# Patient Record
Sex: Female | Born: 1965 | Race: Black or African American | Hispanic: No | Marital: Single | Smoking: Former smoker
Health system: Southern US, Community
[De-identification: ages and names within clinical notes are randomized; demographics above are authoritative.]

## PROBLEM LIST (undated history)

## (undated) DIAGNOSIS — T783XXA Angioneurotic edema, initial encounter: Secondary | ICD-10-CM

## (undated) DIAGNOSIS — F32A Depression, unspecified: Secondary | ICD-10-CM

## (undated) DIAGNOSIS — I10 Essential (primary) hypertension: Secondary | ICD-10-CM

## (undated) DIAGNOSIS — E669 Obesity, unspecified: Secondary | ICD-10-CM

## (undated) DIAGNOSIS — N840 Polyp of corpus uteri: Principal | ICD-10-CM

## (undated) DIAGNOSIS — G4730 Sleep apnea, unspecified: Secondary | ICD-10-CM

## (undated) DIAGNOSIS — F419 Anxiety disorder, unspecified: Secondary | ICD-10-CM

## (undated) DIAGNOSIS — R0683 Snoring: Secondary | ICD-10-CM

## (undated) DIAGNOSIS — G8929 Other chronic pain: Secondary | ICD-10-CM

## (undated) DIAGNOSIS — K219 Gastro-esophageal reflux disease without esophagitis: Secondary | ICD-10-CM

## (undated) DIAGNOSIS — D219 Benign neoplasm of connective and other soft tissue, unspecified: Secondary | ICD-10-CM

## (undated) DIAGNOSIS — R7303 Prediabetes: Secondary | ICD-10-CM

## (undated) DIAGNOSIS — E119 Type 2 diabetes mellitus without complications: Secondary | ICD-10-CM

## (undated) DIAGNOSIS — M25569 Pain in unspecified knee: Secondary | ICD-10-CM

## (undated) DIAGNOSIS — G629 Polyneuropathy, unspecified: Secondary | ICD-10-CM

## (undated) DIAGNOSIS — F329 Major depressive disorder, single episode, unspecified: Secondary | ICD-10-CM

## (undated) DIAGNOSIS — R569 Unspecified convulsions: Secondary | ICD-10-CM

## (undated) DIAGNOSIS — G5601 Carpal tunnel syndrome, right upper limb: Secondary | ICD-10-CM

## (undated) DIAGNOSIS — M1990 Unspecified osteoarthritis, unspecified site: Secondary | ICD-10-CM

## (undated) HISTORY — DX: Depression, unspecified: F32.A

## (undated) HISTORY — DX: Anxiety disorder, unspecified: F41.9

## (undated) HISTORY — DX: Unspecified osteoarthritis, unspecified site: M19.90

## (undated) HISTORY — DX: Polyp of corpus uteri: N84.0

## (undated) HISTORY — DX: Obesity, unspecified: E66.9

## (undated) HISTORY — DX: Benign neoplasm of connective and other soft tissue, unspecified: D21.9

## (undated) HISTORY — DX: Essential (primary) hypertension: I10

## (undated) HISTORY — DX: Prediabetes: R73.03

## (undated) HISTORY — PX: GASTRIC BYPASS: SHX52

## (undated) HISTORY — PX: APPENDECTOMY: SHX54

## (undated) HISTORY — DX: Snoring: R06.83

## (undated) HISTORY — DX: Major depressive disorder, single episode, unspecified: F32.9

## (undated) HISTORY — DX: Angioneurotic edema, initial encounter: T78.3XXA

## (undated) HISTORY — PX: TUBAL LIGATION: SHX77

---

## 2005-08-31 ENCOUNTER — Emergency Department (HOSPITAL_COMMUNITY): Admission: EM | Admit: 2005-08-31 | Discharge: 2005-08-31 | Payer: Self-pay | Admitting: Emergency Medicine

## 2006-11-09 ENCOUNTER — Ambulatory Visit (HOSPITAL_COMMUNITY): Admission: RE | Admit: 2006-11-09 | Discharge: 2006-11-09 | Payer: Self-pay | Admitting: Obstetrics & Gynecology

## 2006-11-25 ENCOUNTER — Ambulatory Visit (HOSPITAL_COMMUNITY): Admission: RE | Admit: 2006-11-25 | Discharge: 2006-11-25 | Payer: Self-pay | Admitting: Family Medicine

## 2006-11-25 ENCOUNTER — Ambulatory Visit: Payer: Self-pay | Admitting: Internal Medicine

## 2006-11-25 ENCOUNTER — Encounter: Payer: Self-pay | Admitting: Family Medicine

## 2007-07-22 ENCOUNTER — Emergency Department (HOSPITAL_COMMUNITY): Admission: EM | Admit: 2007-07-22 | Discharge: 2007-07-22 | Payer: Self-pay | Admitting: Emergency Medicine

## 2008-10-02 ENCOUNTER — Encounter: Payer: Self-pay | Admitting: Physician Assistant

## 2009-09-23 ENCOUNTER — Ambulatory Visit (HOSPITAL_COMMUNITY): Admission: RE | Admit: 2009-09-23 | Discharge: 2009-09-23 | Payer: Self-pay | Admitting: Family Medicine

## 2010-01-09 ENCOUNTER — Encounter: Payer: Self-pay | Admitting: Physician Assistant

## 2010-02-11 ENCOUNTER — Encounter: Payer: Self-pay | Admitting: Physician Assistant

## 2010-02-11 ENCOUNTER — Ambulatory Visit: Payer: Self-pay | Admitting: Family Medicine

## 2010-02-11 DIAGNOSIS — F418 Other specified anxiety disorders: Secondary | ICD-10-CM | POA: Insufficient documentation

## 2010-02-11 DIAGNOSIS — R0989 Other specified symptoms and signs involving the circulatory and respiratory systems: Secondary | ICD-10-CM

## 2010-02-11 DIAGNOSIS — R609 Edema, unspecified: Secondary | ICD-10-CM

## 2010-02-11 DIAGNOSIS — I10 Essential (primary) hypertension: Secondary | ICD-10-CM | POA: Insufficient documentation

## 2010-02-11 DIAGNOSIS — R0609 Other forms of dyspnea: Secondary | ICD-10-CM

## 2010-02-11 DIAGNOSIS — F411 Generalized anxiety disorder: Secondary | ICD-10-CM | POA: Insufficient documentation

## 2010-02-12 ENCOUNTER — Encounter: Payer: Self-pay | Admitting: Physician Assistant

## 2010-03-13 ENCOUNTER — Ambulatory Visit: Payer: Self-pay | Admitting: Cardiology

## 2010-03-13 DIAGNOSIS — R079 Chest pain, unspecified: Secondary | ICD-10-CM

## 2010-03-17 ENCOUNTER — Ambulatory Visit: Payer: Self-pay | Admitting: Family Medicine

## 2010-03-17 DIAGNOSIS — M25569 Pain in unspecified knee: Secondary | ICD-10-CM

## 2010-03-17 DIAGNOSIS — M549 Dorsalgia, unspecified: Secondary | ICD-10-CM | POA: Insufficient documentation

## 2010-03-18 ENCOUNTER — Encounter: Payer: Self-pay | Admitting: Cardiology

## 2010-03-18 ENCOUNTER — Encounter (HOSPITAL_COMMUNITY): Admission: RE | Admit: 2010-03-18 | Discharge: 2010-04-17 | Payer: Self-pay | Admitting: Cardiology

## 2010-03-18 ENCOUNTER — Ambulatory Visit: Payer: Self-pay | Admitting: Cardiology

## 2010-03-19 ENCOUNTER — Encounter: Payer: Self-pay | Admitting: Cardiology

## 2010-03-23 ENCOUNTER — Telehealth: Payer: Self-pay | Admitting: Physician Assistant

## 2010-04-16 ENCOUNTER — Ambulatory Visit: Payer: Self-pay | Admitting: Family Medicine

## 2010-04-16 ENCOUNTER — Encounter: Payer: Self-pay | Admitting: Physician Assistant

## 2010-04-16 DIAGNOSIS — R7303 Prediabetes: Secondary | ICD-10-CM

## 2010-04-16 DIAGNOSIS — E1159 Type 2 diabetes mellitus with other circulatory complications: Secondary | ICD-10-CM | POA: Insufficient documentation

## 2010-04-17 ENCOUNTER — Encounter (INDEPENDENT_AMBULATORY_CARE_PROVIDER_SITE_OTHER): Payer: Self-pay | Admitting: *Deleted

## 2010-06-18 ENCOUNTER — Telehealth: Payer: Self-pay | Admitting: Family Medicine

## 2010-06-24 ENCOUNTER — Ambulatory Visit: Payer: Self-pay | Admitting: Family Medicine

## 2010-06-24 DIAGNOSIS — G4730 Sleep apnea, unspecified: Secondary | ICD-10-CM | POA: Insufficient documentation

## 2010-06-25 ENCOUNTER — Telehealth: Payer: Self-pay | Admitting: Family Medicine

## 2010-06-25 LAB — CONVERTED CEMR LAB
ALT: 11 units/L (ref 0–35)
AST: 16 units/L (ref 0–37)
Albumin: 4 g/dL (ref 3.5–5.2)
Alkaline Phosphatase: 93 units/L (ref 39–117)
Bilirubin, Direct: 0.1 mg/dL (ref 0.0–0.3)
Calcium: 9.3 mg/dL (ref 8.4–10.5)
Chloride: 105 meq/L (ref 96–112)
Cholesterol: 160 mg/dL (ref 0–200)
Creatinine, Ser: 0.81 mg/dL (ref 0.40–1.20)
Hgb A1c MFr Bld: 6.1 % — ABNORMAL HIGH (ref <5.7)
Indirect Bilirubin: 0.6 mg/dL (ref 0.0–0.9)
LDL Cholesterol: 97 mg/dL (ref 0–99)
Sodium: 140 meq/L (ref 135–145)
Total Bilirubin: 0.7 mg/dL (ref 0.3–1.2)
Triglycerides: 88 mg/dL (ref <150)

## 2010-07-07 ENCOUNTER — Telehealth (INDEPENDENT_AMBULATORY_CARE_PROVIDER_SITE_OTHER): Payer: Self-pay | Admitting: *Deleted

## 2010-07-07 ENCOUNTER — Telehealth: Payer: Self-pay | Admitting: Family Medicine

## 2010-07-29 ENCOUNTER — Ambulatory Visit: Admit: 2010-07-29 | Payer: Self-pay | Admitting: Family Medicine

## 2010-08-06 NOTE — Assessment & Plan Note (Signed)
Summary: office visit   Vital Signs:  Patient profile:   45 year old female Menstrual status:  irregular LMP:     06/21/2010 Height:      66.5 inches Weight:      270.50 pounds BMI:     43.16 O2 Sat:      97 % on Room air Pulse rate:   103 / minute Pulse rhythm:   regular Resp:     16 per minute BP sitting:   120 / 60  (left arm)  Vitals Entered By: Adella Hare LPN (June 24, 2010 11:17 AM)  Nutrition Counseling: Patient's BMI is greater than 25 and therefore counseled on weight management options.  O2 Flow:  Room air CC: follow-up visit Is Patient Diabetic? No Comments did not bring meds to ov LMP (date): 06/21/2010     Menstrual Status irregular Enter LMP: 06/21/2010   Primary Care Provider:  Syliva Overman MD  CC:  follow-up visit.  History of Present Illness: Reports  that she has been doing well.She is concerned about sleep apnea with chronic fatigue and excessive daytime sleepiness. Denies recent fever or chills. Denies sinus pressure, nasal congestion , ear pain or sore throat. Denies chest congestion, or cough productive of sputum. Denies chest pain, palpitations, PND, orthopnea or leg swelling. Denies abdominal pain, nausea, vomitting, diarrhea or constipation. Denies change in bowel movements or bloody stool. Denies dysuria , frequency, incontinence or hesitancy. Denies  joint pain, swelling, or reduced mobility. Denies headaches, vertigo, seizures. Denies depression, anxiety or insomnia. Denies  rash, lesions, or itch.     Allergies (verified): 1)  ! Codeine  Past History:  Past Medical History: Anxiety Insomnia Arthritis Depression Hyperlipidemia Hypertension excessive daytime sleepiness and snoring  worse in   Review of Systems      See HPI General:  Complains of fatigue and sleep disorder. Eyes:  Denies discharge and red eye. Endo:  Denies excessive thirst and excessive urination. Heme:  Denies abnormal bruising and  bleeding. Allergy:  Denies hives or rash and itching eyes.  Physical Exam  General:  Well-developed,obese,in no acute distress; alert,appropriate and cooperative throughout examination HEENT: No facial asymmetry,  EOMI, No sinus tenderness, TM's Clear, oropharynx  pink and moist.   Chest: Clear to auscultation bilaterally.  CVS: S1, S2, No murmurs, No S3.   Abd: Soft, Nontender.  MS: Adequate ROM spine, hips, shoulders and knees.  Ext: No edema.   CNS: CN 2-12 intact, power tone and sensation normal throughout.   Skin: Intact, no visible lesions or rashes.  Psych: Good eye contact, normal affect.  Memory intact, not anxious or depressed appearing.    Impression & Recommendations:  Problem # 1:  SLEEP APNEA (ICD-780.57) Assessment Comment Only  Orders: Sleep Disorder Referral (Sleep Disorder)  Problem # 2:  MORBID OBESITY (ICD-278.01) Assessment: Comment Only  Ht: 66.5 (06/24/2010)   Wt: 270.50 (06/24/2010)   BMI: 43.16 (06/24/2010) therapeutic lifestyle change discussed and encouraged  Problem # 3:  HYPERTENSION (ICD-401.9) Assessment: Unchanged  Her updated medication list for this problem includes:    Torsemide 100 Mg Tabs (Torsemide) ..... One tab by mouth once daily    Metoprolol Tartrate 25 Mg Tabs (Metoprolol tartrate) ..... One tab by mouth two times a day  Orders: T-Basic Metabolic Panel [id number])  BP today: 120/60 Prior BP: 110/80 (04/16/2010)  Complete Medication List: 1)  Torsemide 100 Mg Tabs (Torsemide) .... One tab by mouth once daily 2)  Doxepin Hcl 10 Mg Caps (Doxepin hcl) .Marland KitchenMarland KitchenMarland Kitchen  One to two caps by mouth at bedtime 3)  Hydrocodone-acetaminophen 7.5-650 Mg Tabs (Hydrocodone-acetaminophen) .... One half to one tab by mouth three times a day as needed 4)  Clonazepam 1 Mg Tabs (Clonazepam) .... One tab by mouth qid as needed 5)  Simvastatin 40 Mg Tabs (Simvastatin) .... One tab by mouth at bedtime 6)  Metoprolol Tartrate 25 Mg Tabs (Metoprolol  tartrate) .... One tab by mouth two times a day 7)  Naproxen 500 Mg Tabs (Naproxen) .... One tab by mouth two times a day 8)  Klor-con M20 20 Meq Cr-tabs (Potassium chloride crys cr) .... One tab by mouth once daily 9)  Nexium 40 Mg Cpdr (Esomeprazole magnesium) .... Take 1 daily 10)  Phentermine Hcl 37.5 Mg Tabs (Phentermine hcl) .... Take 1 daily for weight loss 11)  Venlafaxine Hcl 37.5 Mg Xr24h-cap (Venlafaxine hcl) .... Take 1 capsule by mouth once a day  Other Orders: T-Lipid Profile [id number]) T-Hepatic Function [id number]) T- Hemoglobin A1C ([id number]) T-Vitamin D (25-Hydroxy) ([id number]) Tdap => 39yrs IM (28413) Admin 1st Vaccine (24401)  Patient Instructions: 1)  Please schedule a follow-up appointment in 4 to 6 weeks. 2)  It is important that you exercise regularly at least 20 minutes 5 times a week. If you develop chest pain, have severe difficulty breathing, or feel very tired , stop exercising immediately and seek medical attention. 3)  You need to lose weight. Consider a lower calorie diet and regular exercise.  4)  Lipid Panel prior to visit, ICD-9: 5)  BMP prior to visit, ICD-9:  fasting asap 6)  Hepatic Panel prior to visit, ICD-9: 7)  HbgA1C prior to visit, ICD-9: 8)  Vit D. 9)  You will be referred for a sleep study  Prescriptions: VENLAFAXINE HCL 37.5 MG XR24H-CAP (VENLAFAXINE HCL) Take 1 capsule by mouth once a day  #30 x 2   Entered and Authorized by:   Syliva Overman MD   Signed by:   Syliva Overman MD on 06/24/2010   Method used:   Electronically to        [facility] (retail)       [address] [place], [place]  [address]       Ph: [phone]       Fax: [phone]   RxID:   [id number]    Orders Added: 1)  Est. Patient Level IV [01601] 2)  T-Basic Metabolic Panel [[id number]] 3)  T-Lipid Profile [80061-22930] 4)  T-Hepatic Function [80076-22960] 5)  T- Hemoglobin A1C [83036-23375] 6)   T-Vitamin D (25-Hydroxy) [09323-55732] 7)  Sleep Disorder Referral [Sleep Disorder] 8)  Tdap => 32yrs IM [90715] 9)  Admin 1st Vaccine [20254]   Immunizations Administered:  Tetanus Vaccine:    Vaccine Type: Tdap    Site: right deltoid    Mfr: GlaxoSmithKline    Dose: 0.5 ml    Route: IM    Given by: Adella Hare LPN    Exp. Date: 04/23/2012    Lot #: [id number]    VIS given: 05/22/08 version given June 24, 2010.   Immunizations Administered:  Tetanus Vaccine:    Vaccine Type: Tdap    Site: right deltoid    Mfr: GlaxoSmithKline    Dose: 0.5 ml    Route: IM    Given by: Adella Hare LPN    Exp. Date: 04/23/2012    Lot #: [id number]    VIS given: 05/22/08 version  given June 24, 2010.

## 2010-08-06 NOTE — Assessment & Plan Note (Signed)
Summary: NP6/GLC   Visit Type:  Initial Consult Primary Provider:  Esperanza Sheets, PA-C   History of Present Illness: 45 year old woman referred for cardiology consultation. She relates a fairly long-standing history of dyspnea on exertion as well as fatigue. She indicates an intermittent "sharp" chest discomfort that is somewhat sporadic as well. She also reports increasing weight and some lower extremity edema, now on torsemide. She denies any history of known cardiovascular disease or cardiomyopathy. She does have some family history as noted below.  She has not undergone any ischemic cardiovascular testing. Echocardiography from 2008 is reviewed below. She does not exercise regularly.  Current Medications (verified): 1)  Torsemide 100 Mg Tabs (Torsemide) .... One Tab By Mouth Once Daily 2)  Doxepin Hcl 10 Mg Caps (Doxepin Hcl) .... One To Two Caps By Mouth At Bedtime 3)  Hydrocodone-Acetaminophen 7.5-650 Mg Tabs (Hydrocodone-Acetaminophen) .... One Half To One Tab By Mouth Three Times A Day As Needed 4)  Clonazepam 1 Mg Tabs (Clonazepam) .... One Tab By Mouth Qid As Needed 5)  Simvastatin 40 Mg Tabs (Simvastatin) .... One Tab By Mouth At Bedtime 6)  Metoprolol Tartrate 25 Mg Tabs (Metoprolol Tartrate) .... One Tab By Mouth Two Times A Day 7)  Naproxen 500 Mg Tabs (Naproxen) .... One Tab By Mouth Two Times A Day 8)  Klor-Con M20 20 Meq Cr-Tabs (Potassium Chloride Crys Cr) .... One Tab By Mouth Once Daily 9)  Nexium 40 Mg Cpdr (Esomeprazole Magnesium) .... Take 1 Daily  Allergies (verified): 1)  ! 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 Sudie Bailey, M.D.      TEST INDICATIONS:  Patient has a history of cardiomegaly on chest x-ray,   test to evaluate.      A 2-D echo with echo Doppler:  Left ventricle is normal in size.  End-   diastolic dimension of 44-mm, interventricular septum and posterior wall   are normal at 10-mm each.  Left atrium is normal at 38-mm, right atrium,   right ventricle are normal.  Aortic root is normal at 25-mm.      The aortic valve is normal with no insufficiency.      Mitral valve is mildly thickened, trace insufficiency.      Pulmonic valve is normal with no insufficiency.      Tricuspid valve is normal with no insufficiency.      Overall, LV systolic function is normal with an LVEF of approximately   65%.  RV EF is normal.      No pericardial effusion is seen.               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  The patient denies anorexia, fever, weight loss, syncope, prolonged cough, headaches, hemoptysis, abdominal pain, melena, hematochezia, and severe indigestion/heartburn.         Otherwise reviewed and negative except as outlined.  Vital Signs:  Patient profile:   45 year old female Weight:      271 pounds BMI:     43.24 Pulse rate:   97 / minute BP sitting:    141 / 87  (right arm)  Vitals Entered By: Dreama Saa, CNA (March 13, 2010 9:29 AM)  Physical Exam  Additional Exam:  Morbidly obese woman in no acute distress. HEENT: Conjunctiva and lids normal, oropharynx with moist mucosa. Neck: Supple, no carotid bruits or elevated JVP, no thyromegaly. Lungs: Clear to auscultation, nonlabored, no wheezing. Cardiac: Regular rate and rhythm, no S3 is evident, indistinct PMI. Abdomen: Obese, unable to palpate liver edge, bowel sounds present. No tenderness. Skin: Warm and dry. Musculoskeletal: No kyphosis. Extremities: No frank pitting edema, increased adipose tissue. Distal pulses 1-2+. Neuropsychiatric: Alert and oriented x3, affect appropriate   EKG  Procedure date:  03/13/2010  Findings:      Normal sinus rhythm at 90 beats per minute with probable left atrial enlargement and minor criteria for left hypertrophy.  Impression & Recommendations:  Problem # 1:  DYSPNEA ON EXERTION (ICD-786.09)  Chronic by history, over the last several months. Could be related to obesity with no regular exercise regimen, and relative diastolic dysfunction in the setting of hypertension and probable left ventricular hypertrophy. She does have risk factors for ischemic heart disease which are outlined above including hypertension, hyperlipidemia, and family history. We discussed the matter, and plan to proceed with a followup 2-D echocardiogram to reassess cardiac structure and function, as well as a Lexiscan Myoview to exclude significant ischemic heart disease. Followup arranged for one month to review.  Her updated medication list for this problem includes:    Torsemide 100 Mg Tabs (Torsemide) ..... One tab by mouth once daily    Metoprolol Tartrate 25 Mg Tabs (Metoprolol tartrate) ..... One tab by mouth two times a day  Problem # 2:  CHEST PAIN (ICD-786.50)  Atypical in description. As noted above, further ischemic workup is planned.  Her updated  medication list for this problem includes:    Metoprolol Tartrate 25 Mg Tabs (Metoprolol tartrate) ..... One tab by mouth two times a day  Her updated medication list for this problem includes:    Metoprolol Tartrate 25 Mg Tabs (Metoprolol tartrate) ..... One tab by mouth two times a day  Problem # 3:  MORBID OBESITY (ICD-278.01)  We did discuss weight loss and exercise today.  Other Orders: 2-D Echocardiogram (2D Echo) Nuclear Stress Test (Nuc Stress Test)  Patient Instructions: 1)  Your physician recommends that you continue on your current medications as directed. Please refer to the Current Medication list given to you today. 2)  Your physician has requested that you have an echocardiogram.  Echocardiography is a painless test that uses sound waves to create images of your heart. It provides your doctor with information about the size and shape of your heart and how well your heart's chambers and  valves are working.  This procedure takes approximately one hour. There are no restrictions for this procedure. 3)  Your physician has requested that you have an [employer]  For further information please visit [email or web address]  Please follow instruction sheet, as given. 4)  Your physician recommends that you schedule a follow-up appointment in: after tests in 1 month

## 2010-08-06 NOTE — Letter (Signed)
Summary: [place]on Treadmill (Nuc Med Stress)  [facility]  [address] [place], [place] [address]   Phone: [phone]  Fax: [phone]    Nuclear Medicine 1-Day Stress Test Information Sheet  Re:     Julie Ryan   DOB:     1965-07-20 MRN:     [id number] Weight:  Appointment Date: Register at: Appointment Time: Referring MD:  ___Exercise Stress  __Adenosine   __Dobutamine  _X_Lexiscan  __Persantine   __Thallium  Urgency: ____1 (next day)   ____2 (one week)    ____3 (PRN)  Patient will receive Follow Up call with results: Patient needs follow-up appointment:  Instructions regarding medication:  How to prepare for your stress test: 1. DO NOT eat or drinK after midnight the night before test. This includes no caffeine (coffee, tea, sodas, chocolate) if you were instructed to take your medications, drink water with it. 2. DO NOT use any tobacco products for at least 8 hours prior to arrival. 3. DO NOT wear dresses or any clothing that may have metal clasps or buttons. 4. Wear short sleeve shirts, loose clothing, and comfortalbe walking shoes. 5. DO NOT use lotions, oils or powder on your chest before the test. 6. The test will take approximately 3-4 hours from the time you arrive until completion. 7. To register the day of the test, go to the Short Stay entrance at [facility]. 8. If you must cancel your test, call [phone] as soon as you are aware. 9. Do not take Torsemide the morning of test.  After you arrive for test:   When you arrive at [facility], you will go to Short Stay to be registered. They will then send you to Radiology to check in. The Nuclear Medicine Tech will get you and start an IV in your arm or hand. A small amount of a radioactive tracer will then be injected into your IV. This tracer will then have to circulate for 30-45 minutes. During this time you will wait in the waiting room and you will be able to drink something  without caffeine. A series of pictures will be taken of your heart follwoing this waiting period. After the 1st set of pictures you will go to the stress lab to get ready for your stress test. During the stress test, another small amount of a radioactive tracer will be injected through your IV. When the stress test is complete, there is a short rest period while your heart rate and blood pressure will be monitored. When this monitoring period is complete you will have another set of pictrues taken. (The same as the 1st set of pictures). These pictures are taken between 15 minutes and 1 hour after the stress test. The time depends on the type of stress test you had. Your doctor will inform you of your test results within 7 days after test.    The possibilities of certain changes are possible during the test. They include abnormal blood pressure and disorders of the heart. Side effects of persantine or adenosine can include flushing, chest pain, shortness of breath, stomach tightness, headache and light-headedness. These side effects usually do not last long and are self-resolving. Every effort will be made to keep you comfortable and to minimize complications by obtaining a medical history and by close observation during the test. Emergency equipment, medications, and trained personnel are available to deal with any unusual situation which may arise.  Please notify office at least 48 hours  in advance if you are unable to keep this appt.

## 2010-08-06 NOTE — Assessment & Plan Note (Signed)
Summary: office visit   Vital Signs:  Patient profile:   45 year old female Weight:      277.25 pounds O2 Sat:      97 % on Room air Pulse rate:   88 / minute BP sitting:   140 / 78  (left arm) CC: follow up, going for a stress test in the am, saw Dr. Diona Browner on 9.9.11-- fluid is still their but other then that no problems.  Room 2 Is Patient Diabetic? No   Primary Provider:  Esperanza Sheets, PA-C  CC:  follow up, going for a stress test in the am, and saw Dr. Diona Browner on 9.9.11-- fluid is still their but other then that no problems.  Room 2.  History of Present Illness: Pt presents today for f/u.  Continues to have problems with peripheral edema.  She has not taken diuretic in 1 week though.  States she cannot take it on days she has to work. She plans to take it tomorrow.  Has not tried support stockings either.  She has had cardiology consult & is scheduled for stress test tomorrow.  Pt states she has a hx of pain in bilat knees, Rt > Lt.  She has had a cortisone injectionin her Rt in the past by her previous PCP and did help.  Has also been having pain in her Rt hip area. Worse after change of position, specifically sitting to standing.  States she has to stand for a minute for the pain to improve before she can move. No trauma.  No parastesias. She had xrays of her Rt knee this yr.  Showed mild DJD.  Had xrays of her back in 2008 and were neg.   Allergies: 1)  ! Codeine  Past History:  Past medical history reviewed for relevance to current acute and chronic problems.  Past Medical History: Reviewed history from 02/11/2010 and no changes required. Anxiety Insomnia Arthritis Depression Hyperlipidemia Hypertension  Review of Systems General:  Denies chills and fever. MS:  Complains of joint pain and low back pain; denies joint redness and mid back pain. Neuro:  Denies numbness and tingling.  Physical Exam  General:  Well-developed,well-nourished,in no acute distress;  alert,appropriate and cooperative throughout examination Head:  Normocephalic and atraumatic without obvious abnormalities. No apparent alopecia or balding. Lungs:  Normal respiratory effort, chest expands symmetrically. Lungs are clear to auscultation, no crackles or wheezes. Heart:  Normal rate and regular rhythm. S1 and S2 normal without gallop, murmur, click, rub or other extra sounds. Msk:  LS spine:  TTP Rt SI.  Nontender spinous processes and sciatic notch.  FROM.  Bilat knees:  FROM.  Nontender to palp.  Crepitus bilat patella noted. Extremities:  No clubbing, cyanosis, edema, or deformity noted with normal full range of motion of all joints.   Neurologic:  alert & oriented X3, strength normal in all extremities, gait normal, and DTRs symmetrical and normal.   Skin:  Intact without suspicious lesions or rashes Psych:  Cognition and judgment appear intact. Alert and cooperative with normal attention span and concentration. No apparent delusions, illusions, hallucinations   Impression & Recommendations:  Problem # 1:  PERIPHERAL EDEMA (ICD-782.3) Assessment Unchanged Encouraged improved compliance with diuretic, low sodium diet, elevate legs, and support stockings at work.  Her updated medication list for this problem includes:    Torsemide 100 Mg Tabs (Torsemide) ..... One tab by mouth once daily  Problem # 2:  KNEE PAIN (ZOX-096.04) Assessment: Deteriorated  Her updated  medication list for this problem includes:    Hydrocodone-acetaminophen 7.5-650 Mg Tabs (Hydrocodone-acetaminophen) ..... One half to one tab by mouth three times a day as needed    Naproxen 500 Mg Tabs (Naproxen) ..... One tab by mouth two times a day  Orders: Orthopedic Referral (Ortho)  Problem # 3:  LUMBAGO (ICD-724.2) Assessment: Comment Only  Her updated medication list for this problem includes:    Hydrocodone-acetaminophen 7.5-650 Mg Tabs (Hydrocodone-acetaminophen) ..... One half to one tab by  mouth three times a day as needed    Naproxen 500 Mg Tabs (Naproxen) ..... One tab by mouth two times a day  Complete Medication List: 1)  Torsemide 100 Mg Tabs (Torsemide) .... One tab by mouth once daily 2)  Doxepin Hcl 10 Mg Caps (Doxepin hcl) .... One to two caps by mouth at bedtime 3)  Hydrocodone-acetaminophen 7.5-650 Mg Tabs (Hydrocodone-acetaminophen) .... One half to one tab by mouth three times a day as needed 4)  Clonazepam 1 Mg Tabs (Clonazepam) .... One tab by mouth qid as needed 5)  Simvastatin 40 Mg Tabs (Simvastatin) .... One tab by mouth at bedtime 6)  Metoprolol Tartrate 25 Mg Tabs (Metoprolol tartrate) .... One tab by mouth two times a day 7)  Naproxen 500 Mg Tabs (Naproxen) .... One tab by mouth two times a day 8)  Klor-con M20 20 Meq Cr-tabs (Potassium chloride crys cr) .... One tab by mouth once daily 9)  Nexium 40 Mg Cpdr (Esomeprazole magnesium) .... Take 1 daily  Patient Instructions: 1)  Please schedule a follow-up appointment in 1 month. 2)  I am referring you to an orthopedic dr for your knee and hip pain. 3)  Continue Naproxen as needed.  You may also try heat or ice to these areas for pain relief. 4)  Continue your fluid pills.   5)  Try support hose while at work, and elevate your legs when able. 6)  Limit your Sodium (Salt). 7)  It is important that you exercise regularly at least 20 minutes 5 times a week. If you develop chest pain, have severe difficulty breathing, or feel very tired , stop exercising immediately and seek medical attention. 8)  You need to lose weight. Consider a lower calorie diet and regular exercise.

## 2010-08-06 NOTE — Letter (Signed)
Summary: med review sheet  med review sheet   Imported By: Rudene Anda 04/20/2010 16:22:00  _____________________________________________________________________  External Attachment:    Type:   Image     Comment:   External Document

## 2010-08-06 NOTE — Progress Notes (Signed)
Summary: MEDICATION  Phone Note Call from Patient   Summary of Call: PATIENT STATES NEED REFILL ON HYDROCODONE-(ACETAMINOPHEN) AND NAPROXEN 500 MG PLEASE SEND TO [facility] Initial call taken by: Eugenio Hoes,  June 18, 2010 2:30 PM  Follow-up for Phone Call        has appt next week, may i refill these meds? Follow-up by: Adella Hare LPN,  June 19, 2010 8:34 AM  Additional Follow-up for Phone Call Additional follow up Details #1::        deny needs to comme to office she has cancelled appts Additional Follow-up by: Syliva Overman MD,  June 19, 2010 3:55 PM

## 2010-08-06 NOTE — Letter (Signed)
Summary: [place] [facility], agricultural at [facility]  [address] [place] [address]   Phone: [phone]  Fax: [phone]      March 19, 2010 MRN: [id number]   Julie Ryan [address], [address] [place], [place]  [address]   Dear Ms. Dimmer,  Your test ordered by Selena Batten has been reviewed by your physician (or physician assistant) and was found to be normal or stable. Your physician (or physician assistant) felt no changes were needed at this time.  ____ Echocardiogram  __X__ Cardiac Stress Test  ____ Lab Work  ____ Peripheral vascular study of arms, legs or neck  ____ CT scan or X-ray  ____ Lung or Breathing test  ____ Other:  Please continue on current medical treatment. Thank you.   Nona Dell, MD, F.A.C.C

## 2010-08-06 NOTE — Progress Notes (Signed)
Summary: hydrocodone  Phone Note Call from Patient   Summary of Call: patient needs her hydrocodone called into [facility], she states you sent in her other medications yesterday just not this one.  Thanks Initial call taken by: Curtis Sites,  June 25, 2010 11:07 AM    Prescriptions: HYDROCODONE-ACETAMINOPHEN 7.5-650 MG TABS (HYDROCODONE-ACETAMINOPHEN) one half to one tab by mouth three times a day as needed  #30 x 2   Entered by:   Adella Hare LPN   Authorized by:   Syliva Overman MD   Signed by:   Adella Hare LPN on 81/19/1478   Method used:   Printed then faxed to ...       [facility] (retail)       [address]       [place], [place]  [address]       Ph: [phone]       Fax: [phone]   RxID:   [id number]

## 2010-08-06 NOTE — Assessment & Plan Note (Signed)
Summary: new patient- room 1   Vital Signs:  Patient profile:   45 year old female Height:      66.5 inches Weight:      275.50 pounds BMI:     43.96 O2 Sat:      98 % on Room air Pulse rate:   87 / minute Resp:     16 per minute BP sitting:   112 / 70  (left arm)  Vitals Entered By: Adella Hare LPN (February 11, 2010 10:57 AM)  Nutrition Counseling: Patient's BMI is greater than 25 and therefore counseled on weight management options. CC: new patient Is Patient Diabetic? No Pain Assessment Patient in pain? no        CC:  new patient.  History of Present Illness: New pt here to establish care with new PCP.  Pt presents today concerned with her swelling in her lower legs and ankles since last May or June.  She was on Furosemide and had no improvement. Her previous physician changed her to Demedex last week and she states she has noticed some mild improvement.  She has tried restricting her sodium intake and no change. Swelling is constant, am and evenings the same.  2-3 pillow orthopnea x 1 mos.  No chest pain.  Admits to difficulty breathing and chest pressure though with exertion, including 1 flight of stairs.  Pt is also very frustrated with her weight.  States she doesnt eat very much and doesnt feel that she eats enough to acct for her being overweight.  No exercise. Pt also states she is tired all the time.  Previous records, including recent labs from Dr Sudie Bailey reviewed. Per pt she had a sleep study 3 yrs ago & was neg.  + snoring, and nocturnal arousals  Current Medications (verified): 1)  Torsemide 100 Mg Tabs (Torsemide) .... One Tab By Mouth Once Daily 2)  Doxepin Hcl 10 Mg Caps (Doxepin Hcl) .... One To Two Caps By Mouth At Bedtime 3)  Hydrocodone-Acetaminophen 7.5-650 Mg Tabs (Hydrocodone-Acetaminophen) .... One Half To One Tab By Mouth Three Times A Day As Needed 4)  Clonazepam 1 Mg Tabs (Clonazepam) .... One Tab By Mouth Qid As Needed 5)  Simvastatin 40 Mg  Tabs (Simvastatin) .... One Tab By Mouth At Bedtime 6)  Metoprolol Tartrate 25 Mg Tabs (Metoprolol Tartrate) .... One Tab By Mouth Two Times A Day 7)  Naproxen 500 Mg Tabs (Naproxen) .... One Tab By Mouth Two Times A Day 8)  Klor-Con M20 20 Meq Cr-Tabs (Potassium Chloride Crys Cr) .... One Tab By Mouth Once Daily  Allergies (verified): 1)  ! Codeine  Past History:  Past medical, surgical, family and social histories (including risk factors) reviewed, and no changes noted (except as noted below).  Past Medical History: Anxiety Insomnia Arthritis Depression Hyperlipidemia Hypertension  Past Surgical History: Appendectomy Tubal ligation  Family History: Reviewed history and no changes required. mother living- CHF,DM, HTN father deceased- natural causes 3 sisters- MS, DM, Hrt Dz- x 1 2 brothers- DM x1  Social History: Reviewed history and no changes required. 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 CV:  Complains of difficulty breathing at night, difficulty breathing while lying down, shortness of breath with exertion, swelling of feet, and swelling of hands; denies chest pain or discomfort and palpitations. Resp:  Complains of shortness of breath; denies cough and wheezing. GI:  Denies abdominal pain, bloody stools, change in bowel habits, dark tarry stools, indigestion, loss of appetite, nausea, and vomiting. GU:  Denies dysuria, incontinence, and urinary frequency. MS:  Complains of joint pain.  Physical Exam  General:  Well-developed,well-nourished,in no acute distress; alert,appropriate and cooperative throughout examination Head:  Normocephalic and atraumatic without obvious abnormalities. No apparent alopecia or balding. Ears:  External ear exam shows no significant lesions or  deformities.  Otoscopic examination reveals clear canals, tympanic membranes are intact bilaterally without bulging, retraction, inflammation or discharge. Hearing is grossly normal bilaterally. Nose:  External nasal examination shows no deformity or inflammation. Nasal mucosa are pink and moist without lesions or exudates. Mouth:  Oral mucosa and oropharynx without lesions or exudates.  Teeth in good repair. Neck:  No deformities, masses, or tenderness noted.no thyromegaly and no thyroid nodules or tenderness.   Lungs:  Normal respiratory effort, chest expands symmetrically. Lungs are clear to auscultation, no crackles or wheezes. Heart:  Normal rate and regular rhythm. S1 and S2 normal without gallop, murmur, click, rub or other extra sounds. Pulses:  R posterior tibial normal, R dorsalis pedis normal, L posterior tibial normal, and L dorsalis pedis normal.   Extremities:  No PTE but edema noted dorsum of feet and ankles. Neurologic:  alert & oriented X3, strength normal in all extremities, sensation intact to light touch, and gait normal.   Skin:  Intact without suspicious lesions or rashes Cervical Nodes:  No lymphadenopathy noted Psych:  Cognition and judgment appear intact. Alert and cooperative with normal attention span and concentration. No apparent delusions, illusions, hallucinations   Impression & Recommendations:  Problem # 1:  PERIPHERAL EDEMA (ICD-782.3) Assessment Unchanged Per pt some slight improvement in the last week with change from Furosemide to Torsemide.  Her updated medication list for this problem includes:    Torsemide 100 Mg Tabs (Torsemide) ..... One tab by mouth once daily  Orders: Cardiology Referral (Cardiology)  Problem # 2:  DYSPNEA ON EXERTION (ICD-786.09) Assessment: New Also orthopnea.  May be due to morbid obesity, but referral to r/o cardiac.  Orders: Cardiology Referral (Cardiology)  Problem # 3:  MORBID OBESITY (ICD-278.01) Assessment:  Comment Only Discussed at length pts typical diet, and healthy diet.  Encouraged fruits, veggies, lean meats, and limit carbs.  Discussed whole grains and reading food labels. Discussed and enouraged [employer], or referral to dietician.  Pt states she wants to check into [employer] first.  Orders: T- Hemoglobin A1C ([address]) Cardiology Referral (Cardiology)  Problem # 4:  HYPERTENSION (ICD-401.9) Assessment: Comment Only BP currently well controlled.  Her updated medication list for this problem includes:    Torsemide 100 Mg Tabs (Torsemide) ..... One tab by mouth once daily    Metoprolol Tartrate 25 Mg Tabs (Metoprolol tartrate) ..... One tab by mouth two times a day  BP today: 112/70  Problem # 5:  HYPERLIPIDEMIA (ICD-272.4) Assessment: Comment Only  Her updated medication list for this problem includes:    Simvastatin 40 Mg Tabs (Simvastatin) ..... One tab by mouth at bedtime  Complete Medication List: 1)  Torsemide 100 Mg Tabs (Torsemide) .... One tab by mouth once daily 2)  Doxepin Hcl 10 Mg Caps (Doxepin hcl) .... One to two caps by mouth at bedtime 3)  Hydrocodone-acetaminophen 7.5-650 Mg Tabs (Hydrocodone-acetaminophen) .... One half to one tab by mouth three times a day as needed 4)  Clonazepam 1 Mg Tabs (Clonazepam) .Marland KitchenMarland KitchenMarland Kitchen  One tab by mouth qid as needed 5)  Simvastatin 40 Mg Tabs (Simvastatin) .... One tab by mouth at bedtime 6)  Metoprolol Tartrate 25 Mg Tabs (Metoprolol tartrate) .... One tab by mouth two times a day 7)  Naproxen 500 Mg Tabs (Naproxen) .... One tab by mouth two times a day 8)  Klor-con M20 20 Meq Cr-tabs (Potassium chloride crys cr) .... One tab by mouth once daily 9)  Nexium 40 Mg Cpdr (Esomeprazole magnesium) .... Take 1 daily  Patient Instructions: 1)  Please schedule a follow-up appointment in 1 month. 2)  Continue your current medications. 3)  We have discussed dietary changes.  Consider [employer] or a referral to a dietician to help  you. 4)  It is important that you exercise regularly at least 20 minutes 5 times a week. If you develop chest pain, have severe difficulty breathing, or feel very tired , stop exercising immediately and seek medical attention. 5)  You need to lose weight. Consider a lower calorie diet and regular exercise.  6)  I have referred you to a Cardiologist for consultation. Prescriptions: TORSEMIDE 100 MG TABS (TORSEMIDE) one tab by mouth once daily  #30 x 1   Entered and Authorized by:   Esperanza Sheets PA   Signed by:   Esperanza Sheets PA on 02/11/2010   Method used:   Electronically to        [facility] (retail)       [address]       [place], [place]  [address]       Ph: [phone]       Fax: [phone]   RxID:   [id number]

## 2010-08-06 NOTE — Letter (Signed)
Summary: Appointment - Missed  [facility] [place]  [address], [place] [address]   Phone: [phone]  Fax: [phone]     April 17, 2010 MRN: [id number]   Leonia Seider [address], [address] [place], [place]  [address]   Dear Ms. Saez,  Our records indicate you missed your appointment on        04/17/10                with Dr.       .[email or web address]                                    It is very important that we reach you to reschedule this appointment. We look forward to participating in your health care needs. Please contact us at the number listed above at your earliest convenience to reschedule this appointment.     Sincerely,    Glass blower/designer

## 2010-08-06 NOTE — Progress Notes (Signed)
Summary: RESULTS  Phone Note Call from Patient   Summary of Call: WANTS THE RESULTS OF HER STRESS TEST  CALL BACK AT [phone] Initial call taken by: Lind Guest,  March 23, 2010 1:02 PM  Follow-up for Phone Call        advised she will recieve a letter with normal results  patient wants to know since stress test was normal can you prescribe diet pill Follow-up by: Adella Hare LPN,  March 23, 2010 1:58 PM  Additional Follow-up for Phone Call Additional follow up Details #1::        I have prescribed one mos of Phentermine.  Advise her to keep her appt in Oct. Additional Follow-up by: Esperanza Sheets PA,  March 23, 2010 2:23 PM    Additional Follow-up for Phone Call Additional follow up Details #2::    returned call, left message Follow-up by: Adella Hare LPN,  March 23, 2010 3:12 PM  Additional Follow-up for Phone Call Additional follow up Details #3:: Details for Additional Follow-up Action Taken: patient aware Additional Follow-up by: Adella Hare LPN,  March 23, 2010 3:20 PM  New/Updated Medications: PHENTERMINE HCL 37.5 MG TABS (PHENTERMINE HCL) take 1 daily for weight loss Prescriptions: PHENTERMINE HCL 37.5 MG TABS (PHENTERMINE HCL) take 1 daily for weight loss  #30 x 0   Entered and Authorized by:   Esperanza Sheets PA   Signed by:   Esperanza Sheets PA on 03/23/2010   Method used:   Printed then faxed to ...       [facility] (retail)       [address]       [place], [place]  [address]       Ph: [phone]       Fax: [phone]   RxID:   [id number] PHENTERMINE HCL 37.5 MG TABS (PHENTERMINE HCL) take 1 daily for weight loss  #30 x 0   Entered and Authorized by:   Esperanza Sheets PA   Signed by:   Esperanza Sheets PA on 03/23/2010   Method used:   Printed then faxed to ...       [facility] (retail)       [address]       [place], [place]  [address]       Ph:  [phone]       Fax: [phone]   RxID:   [id number]

## 2010-08-06 NOTE — Progress Notes (Signed)
Summary: medicine and cold  Phone Note Call from Patient   Summary of Call: Julie Ryan apot did not receive her rx for hydrcodone wants you to call her at [phone] to see what to take for a cold Initial call taken by: Lind Guest,  July 07, 2010 10:16 AM  Follow-up for Phone Call        med called in and advised mucinex Follow-up by: Adella Hare LPN,  July 07, 2010 3:08 PM

## 2010-08-06 NOTE — Assessment & Plan Note (Signed)
Summary: office visit   Vital Signs:  Patient profile:   45 year old female Height:      66.5 inches Weight:      264.25 pounds BMI:     42.16 O2 Sat:      99 % on Room air Pulse rate:   97 / minute Resp:     16 per minute BP sitting:   110 / 80  (left arm)  Vitals Entered By: Mauricia Area CMA (April 16, 2010 11:21 AM) CC: Follow up Comments Did not bring meds   Primary Naithen [place]:  Esperanza Sheets, PA-C  CC:  Follow up.  History of Present Illness: Pt presents today for check up.  She started Phentermine 1 mos ago to help her with her wt loss.  SHe has lost 13 lbs.  She feels that the Phentermine is helping with her appetite.  Is also trying to eat healthier.  Hx of htn. Taking medications as prescribed and denies side effects.  No headache, chest pain or palpitations.  Stil having problems with peripheral edema. Uses Torsemide as needed. Hx of Hyperlipidemia.  Taking medications as prescribed and no side effects.  Following a low fat diet. Not exercising regularly. Pt requests refill Hydrocodone. Takes 1 tab three times a day as needed.  On avg uses 5 pills per week.  Takes this for knee pain, and menstrual cramps.    Allergies (verified): 1)  ! Codeine  Past History:  Past medical history reviewed for relevance to current acute and chronic problems.  Past Medical History: Reviewed history from 02/11/2010 and no changes required. Anxiety Insomnia Arthritis Depression Hyperlipidemia Hypertension  Review of Systems General:  Denies chills and fever. ENT:  Denies earache, nasal congestion, and sore throat. CV:  Complains of palpitations; denies chest pain or discomfort. Resp:  Denies cough and shortness of breath. GI:  Denies abdominal pain, change in bowel habits, indigestion, nausea, and vomiting.  Physical Exam  General:  Well-developed,well-nourished,in no acute distress; alert,appropriate and cooperative throughout examination Head:  Normocephalic and  atraumatic without obvious abnormalities. No apparent alopecia or balding. Ears:  External ear exam shows no significant lesions or deformities.  Otoscopic examination reveals clear canals, tympanic membranes are intact bilaterally without bulging, retraction, inflammation or discharge. Hearing is grossly normal bilaterally. Nose:  External nasal examination shows no deformity or inflammation. Nasal mucosa are pink and moist without lesions or exudates. Mouth:  Oral mucosa and oropharynx without lesions or exudates.  Teeth in good repair. Neck:  No deformities, masses, or tenderness noted. Lungs:  Normal respiratory effort, chest expands symmetrically. Lungs are clear to auscultation, no crackles or wheezes. Heart:  Normal rate and regular rhythm. S1 and S2 normal without gallop, murmur, click, rub or other extra sounds. Pulses:  R posterior tibial normal, R dorsalis pedis normal, L posterior tibial normal, and L dorsalis pedis normal.   Extremities:  trace left pedal edema and trace right pedal edema.   Neurologic:  alert & oriented X3, sensation intact to light touch, and gait normal.   Cervical Nodes:  No lymphadenopathy noted Psych:  Cognition and judgment appear intact. Alert and cooperative with normal attention span and concentration. No apparent delusions, illusions, hallucinations   Impression & Recommendations:  Problem # 1:  HYPERTENSION (ICD-401.9) Assessment Improved  Her updated medication list for this problem includes:    Torsemide 100 Mg Tabs (Torsemide) ..... One tab by mouth once daily    Metoprolol Tartrate 25 Mg Tabs (Metoprolol tartrate) ..... One  tab by mouth two times a day  BP today: 110/80 Prior BP: 140/78 (03/17/2010)  Orders: T-Comprehensive Metabolic Panel ([address])  Problem # 2:  PERIPHERAL EDEMA (ICD-782.3) Assessment: Comment Only  Her updated medication list for this problem includes:    Torsemide 100 Mg Tabs (Torsemide) ..... One tab by mouth  once daily  Problem # 3:  PRE-DIABETES (ICD-790.29) Assessment: Comment Only  Orders: T- Hemoglobin A1C ([address])  Problem # 4:  HYPERLIPIDEMIA (ICD-272.4) Assessment: Comment Only  Her updated medication list for this problem includes:    Simvastatin 40 Mg Tabs (Simvastatin) ..... One tab by mouth at bedtime  Orders: T-Comprehensive Metabolic Panel [id number]) T-Lipid Profile ([address])  Problem # 5:  MORBID OBESITY (ICD-278.01) Assessment: Comment Only  Complete Medication List: 1)  Torsemide 100 Mg Tabs (Torsemide) .... One tab by mouth once daily 2)  Doxepin Hcl 10 Mg Caps (Doxepin hcl) .... One to two caps by mouth at bedtime 3)  Hydrocodone-acetaminophen 7.5-650 Mg Tabs (Hydrocodone-acetaminophen) .... One half to one tab by mouth three times a day as needed 4)  Clonazepam 1 Mg Tabs (Clonazepam) .... One tab by mouth qid as needed 5)  Simvastatin 40 Mg Tabs (Simvastatin) .... One tab by mouth at bedtime 6)  Metoprolol Tartrate 25 Mg Tabs (Metoprolol tartrate) .... One tab by mouth two times a day 7)  Naproxen 500 Mg Tabs (Naproxen) .... One tab by mouth two times a day 8)  Klor-con M20 20 Meq Cr-tabs (Potassium chloride crys cr) .... One tab by mouth once daily 9)  Nexium 40 Mg Cpdr (Esomeprazole magnesium) .... Take 1 daily 10)  Phentermine Hcl 37.5 Mg Tabs (Phentermine hcl) .... Take 1 daily for weight loss  Patient Instructions: 1)  Please schedule a follow-up appointment in 2 months. 2)  Have blood work drawn in November fasting. 3)  I have refilled your medications as discussed. 4)  Congratulations on your weight loss!  Keep up the good work! 5)  It is important that you exercise regularly at least 20 minutes 5 times a week. If you develop chest pain, have severe difficulty breathing, or feel very tired , stop exercising immediately and seek medical attention. 6)  You need to lose weight. Consider a lower calorie diet and regular exercise.   Prescriptions: PHENTERMINE HCL 37.5 MG TABS (PHENTERMINE HCL) take 1 daily for weight loss  #30 x 1   Entered and Authorized by:   Esperanza Sheets PA   Signed by:   Esperanza Sheets PA on 04/16/2010   Method used:   Printed then faxed to ...       [facility] (retail)       [address]       [place], [place]  [address]       Ph: [phone]       Fax: [phone]   RxID:   [id number] DOXEPIN HCL 10 MG CAPS (DOXEPIN HCL) one to two caps by mouth at bedtime  #60 x 2   Entered and Authorized by:   Esperanza Sheets PA   Signed by:   Esperanza Sheets PA on 04/16/2010   Method used:   Electronically to        [facility] (retail)       [address] [place], [place]  [address]       Ph: [phone]  Fax: [phone]   RxID:   [id number] TORSEMIDE 100 MG TABS (TORSEMIDE) one tab by mouth once daily  #30 x 1   Entered and Authorized by:   Esperanza Sheets PA   Signed by:   Esperanza Sheets PA on 04/16/2010   Method used:   Electronically to        [facility] (retail)       [address]       [place], [place]  [address]       Ph: [phone]       Fax: [phone]   RxID:   [id number] HYDROCODONE-ACETAMINOPHEN 7.5-650 MG TABS (HYDROCODONE-ACETAMINOPHEN) one half to one tab by mouth three times a day as needed  #30 x 0   Entered and Authorized by:   Esperanza Sheets PA   Signed by:   Esperanza Sheets PA on 04/16/2010   Method used:   Printed then faxed to ...       [facility] (retail)       [address]       [place], [place]  [address]       Ph: [phone]       Fax: [phone]   RxID:   [id number]

## 2010-08-12 NOTE — Progress Notes (Signed)
Summary: sleep study appt  Phone Note Call from Patient   Summary of Call: does not want to go to dr Harriett Sine wants to go to the hospital Initial call taken by: Lind Guest,  July 07, 2010 10:17 AM  Follow-up for Phone Call        spoke to patient and advised her that Dr. Gerilyn Pilgrim would read the sleep study but she would go to the hospital to have the study done.  But she doesn't want to have to see him, so I will fax over paperwork to [facility]. Follow-up by: Curtis Sites,  July 09, 2010 9:14 AM

## 2010-08-31 ENCOUNTER — Telehealth: Payer: Self-pay | Admitting: Family Medicine

## 2010-09-01 ENCOUNTER — Telehealth: Payer: Self-pay | Admitting: Family Medicine

## 2010-09-10 NOTE — Progress Notes (Signed)
Summary: refills  Phone Note Call from Patient   Summary of Call: needs refills on phentermine, hydrocodine, depression med. [facility] [phone] Initial call taken by: Rudene Anda,  September 01, 2010 11:47 AM    Prescriptions: HYDROCODONE-ACETAMINOPHEN 7.5-650 MG TABS (HYDROCODONE-ACETAMINOPHEN) one half to one tab by mouth three times a day as needed  #30 x 0   Entered by:   Adella Hare LPN   Authorized by:   Syliva Overman MD   Signed by:   Adella Hare LPN on 45/40/9811   Method used:   Printed then faxed to ...       [facility] (retail)       [address]       [place], [place]  [address]       Ph: [phone]       Fax: [phone]   RxID:   [id number] PHENTERMINE HCL 37.5 MG TABS (PHENTERMINE HCL) take 1 daily for weight loss  #30 x 0   Entered by:   Adella Hare LPN   Authorized by:   Syliva Overman MD   Signed by:   Adella Hare LPN on 66/44/0347   Method used:   Printed then faxed to ...       [facility] (retail)       [address]       [place], [place]  [address]       Ph: [phone]       Fax: [phone]   RxID:   [id number] VENLAFAXINE HCL 37.5 MG XR24H-CAP (VENLAFAXINE HCL) Take 1 capsule by mouth once a day  #30 x 0   Entered by:   Adella Hare LPN   Authorized by:   Syliva Overman MD   Signed by:   Adella Hare LPN on 22/08/5425   Method used:   Printed then faxed to ...       [facility] ([facility]ail)       [address]       [place], [place]  [address]       Ph: [phone]       Fax: [phone]   RxID:   [id number]

## 2010-09-10 NOTE — Progress Notes (Signed)
Summary:  medicines  Phone Note Call from Patient   Summary of Call: needs all her medicine filled at [facility]. she has a appoinment the 13th of march Initial call taken by: Lind Guest,  August 31, 2010 9:06 AM    Prescriptions: VENLAFAXINE HCL 37.5 MG XR24H-CAP (VENLAFAXINE HCL) Take 1 capsule by mouth once a day  #30 x 0   Entered by:   Adella Hare LPN   Authorized by:   Syliva Overman MD   Signed by:   Adella Hare LPN on 54/27/0623   Method used:   Electronically to        [facility]* (retail)       [address] [place], [place]  [address]       Ph: [phone]       Fax: [phone]   RxID:   [id number] METOPROLOL TARTRATE 25 MG TABS (METOPROLOL TARTRATE) one tab by mouth two times a day  #60 x 0   Entered by:   Adella Hare LPN   Authorized by:   Syliva Overman MD   Signed by:   Adella Hare LPN on 93/71/6967   Method used:   Electronically to        [facility]* (retail)       [address] [place], [place]  [address]       Ph: [phone]       Fax: [phone]   RxID:   [id number] SIMVASTATIN 40 MG TABS (SIMVASTATIN) one tab by mouth at bedtime  #30 x 0   Entered by:   Adella Hare LPN   Authorized by:   Syliva Overman MD   Signed by:   Adella Hare LPN on 50/93/2671   Method used:   Electronically to        [facility]* (retail)       [address] [place], [place]  [address]       Ph: [phone]       Fax: [phone]   RxID:   [id number] DOXEPIN HCL 10 MG CAPS (DOXEPIN HCL) one to two caps by mouth at bedtime  #60 x 0   Entered by:   Adella Hare LPN   Authorized by:   Syliva Overman MD   Signed by:   Adella Hare LPN on 26/83/4196   Method used:   Electronically to        [facility] (retail)       [address] [place], [place]  [address]       Ph:  [phone]       Fax: [phone]   RxID:   [id number] TORSEMIDE 100 MG TABS (TORSEMIDE) one tab by mouth once daily  #30 x 0   Entered by:   Adella Hare LPN   Authorized by:   Syliva Overman MD   Signed by:   Adella Hare LPN on 02/77/4128   Method used:   Electronically to        [facility] (retail)       [address]       [place], [place]  [address]       Ph: [phone]       Fax: [phone]   RxID:   [id number]

## 2010-09-15 ENCOUNTER — Ambulatory Visit (INDEPENDENT_AMBULATORY_CARE_PROVIDER_SITE_OTHER): Payer: BC Managed Care – PPO | Admitting: Family Medicine

## 2010-09-15 ENCOUNTER — Encounter: Payer: Self-pay | Admitting: Family Medicine

## 2010-09-15 DIAGNOSIS — R5381 Other malaise: Secondary | ICD-10-CM | POA: Insufficient documentation

## 2010-09-15 DIAGNOSIS — R5383 Other fatigue: Secondary | ICD-10-CM

## 2010-09-15 DIAGNOSIS — M25569 Pain in unspecified knee: Secondary | ICD-10-CM

## 2010-09-15 DIAGNOSIS — I10 Essential (primary) hypertension: Secondary | ICD-10-CM

## 2010-09-15 DIAGNOSIS — F329 Major depressive disorder, single episode, unspecified: Secondary | ICD-10-CM

## 2010-09-15 DIAGNOSIS — E785 Hyperlipidemia, unspecified: Secondary | ICD-10-CM

## 2010-09-16 ENCOUNTER — Other Ambulatory Visit: Payer: Self-pay | Admitting: Family Medicine

## 2010-09-16 DIAGNOSIS — Z139 Encounter for screening, unspecified: Secondary | ICD-10-CM

## 2010-09-20 DIAGNOSIS — J309 Allergic rhinitis, unspecified: Secondary | ICD-10-CM | POA: Insufficient documentation

## 2010-09-21 ENCOUNTER — Ambulatory Visit (HOSPITAL_COMMUNITY): Payer: Self-pay

## 2010-09-22 ENCOUNTER — Ambulatory Visit (HOSPITAL_COMMUNITY): Payer: BC Managed Care – PPO

## 2010-09-30 ENCOUNTER — Other Ambulatory Visit: Payer: Self-pay | Admitting: Family Medicine

## 2010-10-01 NOTE — Assessment & Plan Note (Signed)
Summary: f up   Vital Signs:  Patient profile:   45 year old female Menstrual status:  irregular Height:      66.5 inches Weight:      270.50 pounds BMI:     43.16 O2 Sat:      95 % Pulse rate:   91 / minute Pulse rhythm:   regular Resp:     16 per minute BP sitting:   120 / 80  (left arm) Cuff size:   xl  Vitals Entered By: Everitt Amber LPN (September 15, 2010 2:53 PM)  Nutrition Counseling: Patient's BMI is greater than 25 and therefore counseled on weight management options. CC: Follow up visit, allergies bothering her and left knee has started bothering her again   Primary Care Provider:  Syliva Overman MD  CC:  Follow up visit and allergies bothering her and left knee has started bothering her again.  History of Present Illness: Reports  thatshe has been well up until 3 days ago Denies recent fever or chills. Denies sinus pressure, nasal congestion , ear pain or sore throat. Denies chest congestion, or cough productive of sputum. Denies chest pain, palpitations, PND, orthopnea or leg swelling. Denies abdominal pain, nausea, vomitting, diarrhea or constipation. Denies change in bowel movements or bloody stool. Denies dysuria , frequency, incontinence or hesitancy.  Denies headaches, vertigo, seizures. Denies depression, anxiety or insomnia. Denies  rash, lesions, or itch.     Preventive Screening-Counseling & Management  Alcohol-Tobacco     Smoking Status: current     Packs/Day: <0.25  Current Medications (verified): 1)  Torsemide 100 Mg Tabs (Torsemide) .... One Tab By Mouth Once Daily 2)  Doxepin Hcl 10 Mg Caps (Doxepin Hcl) .... One To Two Caps By Mouth At Bedtime 3)  Hydrocodone-Acetaminophen 7.5-650 Mg Tabs (Hydrocodone-Acetaminophen) .... One Half To One Tab By Mouth Three Times A Day As Needed 4)  Clonazepam 1 Mg Tabs (Clonazepam) .... One Tab By Mouth Qid As Needed 5)  Simvastatin 40 Mg Tabs (Simvastatin) .... One Tab By Mouth At Bedtime 6)  Metoprolol  Tartrate 25 Mg Tabs (Metoprolol Tartrate) .... One Tab By Mouth Two Times A Day 7)  Naproxen 500 Mg Tabs (Naproxen) .... One Tab By Mouth Two Times A Day 8)  Klor-Con M20 20 Meq Cr-Tabs (Potassium Chloride Crys Cr) .... One Tab By Mouth Once Daily 9)  Nexium 40 Mg Cpdr (Esomeprazole Magnesium) .... Take 1 Daily 10)  Phentermine Hcl 37.5 Mg Tabs (Phentermine Hcl) .... Take 1 Daily For Weight Loss 11)  Venlafaxine Hcl 37.5 Mg Xr24h-Cap (Venlafaxine Hcl) .... Take 1 Capsule By Mouth Once A Day  Allergies (verified): 1)  ! Codeine  Social History: Smoking Status:  current Packs/Day:  <0.25  Review of Systems      See HPI Eyes:  Denies blurring and discharge. MS:  Complains of joint pain and stiffness; left knee pain and stiffness with crunching and popping x 3 days, no trauma , first episode.. Endo:  Denies cold intolerance, excessive hunger, excessive thirst, excessive urination, and heat intolerance. Heme:  Denies abnormal bruising, bleeding, enlarge lymph nodes, and fevers. Allergy:  Complains of seasonal allergies; denies hives or rash and itching eyes.  Physical Exam  General:  Well-developed,well-nourished,in no acute distress; alert,appropriate and cooperative throughout examination HEENT: No facial asymmetry,  EOMI, No sinus tenderness, TM's Clear, oropharynx  pink and moist.   Chest: Clear to auscultation bilaterally.  CVS: S1, S2, No murmurs, No S3.   Abd: Soft, Nontender.  MS: Adequate ROM spine, hips, shoulders and  reduced in left knee and crepitus.  Ext: No edema.   CNS: CN 2-12 intact, power tone and sensation normal throughout.   Skin: Intact, no visible lesions or rashes.  Psych: Good eye contact, normal affect.  Memory intact, not anxious or depressed appearing.    Impression & Recommendations:  Problem # 1:  KNEE PAIN, LEFT (ICD-719.46) Assessment Deteriorated  Her updated medication list for this problem includes:    Hydrocodone-acetaminophen 7.5-650 Mg  Tabs (Hydrocodone-acetaminophen) ..... One half to one tab by mouth three times a day as needed    Naproxen 500 Mg Tabs (Naproxen) ..... One tab by mouth two times a day    Ibuprofen 800 Mg Tabs (Ibuprofen) .Marland Kitchen... Take 1 tablet by mouth three times a day  Orders: Depo- Medrol 80mg  (J1040) Ketorolac-Toradol 15mg  (W1191) Admin of Therapeutic Inj  intramuscular or subcutaneous (47829)  Problem # 2:  HYPERTENSION (ICD-401.9) Assessment: Unchanged  Her updated medication list for this problem includes:    Torsemide 100 Mg Tabs (Torsemide) ..... One tab by mouth once daily    Metoprolol Tartrate 25 Mg Tabs (Metoprolol tartrate) ..... One tab by mouth two times a day  BP today: 120/80 Prior BP: 120/60 (06/24/2010)  Labs Reviewed: K+: 4.3 (06/25/2010) Creat: : 0.81 (06/25/2010)   Chol: 160 (06/25/2010)   HDL: 45 (06/25/2010)   LDL: 97 (06/25/2010)   TG: 88 (06/25/2010)  Problem # 3:  MORBID OBESITY (ICD-278.01) Assessment: Unchanged  Ht: 66.5 (09/15/2010)   Wt: 270.50 (09/15/2010)   BMI: 43.16 (09/15/2010) therapeutic lifestyle change discussed and encouraged  Problem # 4:  ALLERGIC RHINITIS CAUSE UNSPECIFIED (ICD-477.9) Assessment: Deteriorated  Her updated medication list for this problem includes:    Fluticasone Propionate 50 Mcg/act Susp (Fluticasone propionate) ..... One to two puffs per nostril once daily for allergies  Complete Medication List: 1)  Torsemide 100 Mg Tabs (Torsemide) .... One tab by mouth once daily 2)  Doxepin Hcl 10 Mg Caps (Doxepin hcl) .... One to two caps by mouth at bedtime 3)  Hydrocodone-acetaminophen 7.5-650 Mg Tabs (Hydrocodone-acetaminophen) .... One half to one tab by mouth three times a day as needed 4)  Clonazepam 1 Mg Tabs (Clonazepam) .... One tab by mouth qid as needed 5)  Simvastatin 40 Mg Tabs (Simvastatin) .... One tab by mouth at bedtime 6)  Metoprolol Tartrate 25 Mg Tabs (Metoprolol tartrate) .... One tab by mouth two times a day 7)   Naproxen 500 Mg Tabs (Naproxen) .... One tab by mouth two times a day 8)  Klor-con M20 20 Meq Cr-tabs (Potassium chloride crys cr) .... One tab by mouth once daily 9)  Nexium 40 Mg Cpdr (Esomeprazole magnesium) .... Take 1 daily 10)  Phentermine Hcl 37.5 Mg Tabs (Phentermine hcl) .... Take 1 daily for weight loss 11)  Venlafaxine Hcl 37.5 Mg Xr24h-cap (Venlafaxine hcl) .... Take 1 capsule by mouth once a day 12)  Ibuprofen 800 Mg Tabs (Ibuprofen) .... Take 1 tablet by mouth three times a day 13)  Prednisone (pak) 5 Mg Tabs (Prednisone) .... Use as directed 14)  Vitamin D (ergocalciferol) 50000 Unit Caps (Ergocalciferol) .... One capsule once weekly 15)  Certrizine 10mg   .... Take 1 tablet by mouth once a day 16)  Fluticasone Propionate 50 Mcg/act Susp (Fluticasone propionate) .... One to two puffs per nostril once daily for allergies  Other Orders: T-Basic Metabolic Panel [id number]) T-Hepatic Function [phone]) T-Lipid Profile [id number]) T- Hemoglobin A1C [id number]) T-Basic Metabolic Panel (  [id number]) T- Hemoglobin A1C ([id number]) T-CBC w/Diff ([id number]) T-TSH [id number]) Future Orders: Radiology Referral (Radiology) ... 09/16/2010  Patient Instructions: 1)  Please schedule a follow-up appointment in 4 months. 2)  [place] past due we will schedule at [facility]. 3)  pls sched your pap. 4)  Pls take HALF phentermine once daily, if this does not help and you inc the dose you will neeed a 2 month appt. 5)  To lose weight CONSISTENCY in lifestyle cange is the biggest driver, and expect 3 to6 pounds per month of weight loss 6)  Try and split the demadex in half in the weeks you do not gen take it 7)  BMP prior to visit, ICD-9: 8)  HbgA1C prior to visit, ICD-9:   end March 9)  TSH prior to visit, ICD-9: 10)  CBC w/ Diff prior to visit, ICD-9 11)  Fasting in 4 months lipids, hepatic, chem 7 and HBA1C 12)  Injections today for left knee pain and medication has already been sent  in Prescriptions: FLUTICASONE PROPIONATE 50 MCG/ACT SUSP (FLUTICASONE PROPIONATE) one to two puffs per nostril once daily for allergies  #1 x 4   Entered and Authorized by:   Syliva Overman MD   Signed by:   Syliva Overman MD on 09/15/2010   Method used:   Electronically to        [facility]* (retail)       [address] [place], [place]  [address]       Ph: [phone]       Fax: [phone]   RxID:   [id number] CERTRIZINE 10MG  Take 1 tablet by mouth once a day  #90 x 1   Entered and Authorized by:   Syliva Overman MD   Signed by:   Syliva Overman MD on 09/15/2010   Method used:   Print then Give to Patient   RxID:   [id number] VITAMIN D (ERGOCALCIFEROL) 50000 UNIT CAPS (ERGOCALCIFEROL) one capsule once weekly  #12 x 1   Entered and Authorized by:   Syliva Overman MD   Signed by:   Syliva Overman MD on 09/15/2010   Method used:   Electronically to        [facility]* (retail)       [address] [place], [place]  [address]       Ph: [phone]       Fax: [phone]   RxID:   [id number] PREDNISONE (PAK) 5 MG TABS (PREDNISONE) Use as directed  #21 x 0   Entered and Authorized by:   Syliva Overman MD   Signed by:   Syliva Overman MD on 09/15/2010   Method used:   Electronically to        [facility]* (retail)       [address]       [place], [place]  [address]       Ph: [phone]       Fax: [phone]   RxID:   [id number] IBUPROFEN 800 MG TABS (IBUPROFEN) Take 1 tablet by mouth three times a day  #30 x 0   Entered and Authorized by:   Syliva Overman MD   Signed by:   Syliva Overman MD on 09/15/2010   Method used:   Electronically to  [facility]* (retail)       [address]       [place], [place]  [address]       Ph: [phone]       Fax: [phone]   RxID:    [id number]    Medication Administration  Injection # 1:    Medication: Depo- Medrol 80mg     Diagnosis: KNEE PAIN, LEFT (ICD-719.46)    Route: IM    Site: RUOQ gluteus    Exp Date: 11/12    Lot #: [id number]    Mfr: Pharmacia    Patient tolerated injection without complications    Given by: Adella Hare LPN (September 15, 2010 4:03 PM)  Injection # 2:    Medication: Ketorolac-Toradol 15mg     Diagnosis: KNEE PAIN, LEFT (ICD-719.46)    Route: IM    Site: LUOQ gluteus    Exp Date: 02/03/2012    Lot #: [id number]    Mfr: [email or web address]    Comments: toradol 60mg  given    Patient tolerated injection without complications    Given by: Adella Hare LPN (September 15, 2010 4:04 PM)  Orders Added: 1)  Est. Patient Level IV [99214] 2)  T-Basic Metabolic Panel [80048-22910] 3)  T-Hepatic Function [80076-22960] 4)  T-Lipid Profile [80061-22930] 5)  T- Hemoglobin A1C [83036-23375] 6)  T-Basic Metabolic Panel [80048-22910] 7)  T- Hemoglobin A1C [83036-23375] 8)  T-CBC w/Diff [01027-25366] 9)  T-TSH [44034-74259] 10)  Depo- Medrol 80mg  [J1040] 11)  Ketorolac-Toradol 15mg  [J1885] 12)  Admin of Therapeutic Inj  intramuscular or subcutaneous [96372] 13)  Radiology Referral [Radiology]     Medication Administration  Injection # 1:    Medication: Depo- Medrol 80mg     Diagnosis: KNEE PAIN, LEFT (ICD-719.46)    Route: IM    Site: RUOQ gluteus    Exp Date: 11/12    Lot #: [id number]    Mfr: Pharmacia    Patient tolerated injection without complications    Given by: Adella Hare LPN (September 15, 2010 4:03 PM)  Injection # 2:    Medication: Ketorolac-Toradol 15mg     Diagnosis: KNEE PAIN, LEFT (ICD-719.46)    Route: IM    Site: LUOQ gluteus    Exp Date: 02/03/2012    Lot #: [id number]    Mfr: [email or web address]    Comments: toradol 60mg  given    Patient tolerated injection without complications    Given by: Adella Hare LPN (September 15, 2010 4:04 PM)  Orders Added: 1)  Est. Patient Level IV [43329] 2)   T-Basic Metabolic Panel [80048-22910] 3)  T-Hepatic Function [80076-22960] 4)  T-Lipid Profile [80061-22930] 5)  T- Hemoglobin A1C [83036-23375] 6)  T-Basic Metabolic Panel [80048-22910] 7)  T- Hemoglobin A1C [83036-23375] 8)  T-CBC w/Diff [51884-16606] 9)  T-TSH [30160-10932] 10)  Depo- Medrol 80mg  [J1040] 11)  Ketorolac-Toradol 15mg  [J1885] 12)  Admin of Therapeutic Inj  intramuscular or subcutaneous [96372] 13)  Radiology Referral [Radiology]

## 2010-10-06 ENCOUNTER — Other Ambulatory Visit: Payer: Self-pay | Admitting: Family Medicine

## 2010-10-15 ENCOUNTER — Other Ambulatory Visit: Payer: Self-pay | Admitting: Family Medicine

## 2010-10-15 DIAGNOSIS — I10 Essential (primary) hypertension: Secondary | ICD-10-CM

## 2010-10-15 DIAGNOSIS — E785 Hyperlipidemia, unspecified: Secondary | ICD-10-CM

## 2010-10-19 ENCOUNTER — Other Ambulatory Visit: Payer: Self-pay | Admitting: Family Medicine

## 2010-10-28 ENCOUNTER — Other Ambulatory Visit: Payer: Self-pay | Admitting: Family Medicine

## 2010-10-28 DIAGNOSIS — G4700 Insomnia, unspecified: Secondary | ICD-10-CM

## 2010-11-17 NOTE — Procedures (Signed)
NAMEGERAL, TUCH                ACCOUNT NO.:  [id number]   MEDICAL RECORD NO.:  [id number]          PATIENT TYPE:  OUT   LOCATION:  RAD                           FACILITY:  [facility]   PHYSICIAN:  Pricilla Riffle, MD, FACCDATE OF BIRTH:  1966-02-09   DATE OF PROCEDURE:  11/25/2006  DATE OF DISCHARGE:                                ECHOCARDIOGRAM   REFERRING PHYSICIAN:  Mila Homer. Sudie Bailey, M.D.   TEST INDICATIONS:  Patient has a history of cardiomegaly on chest x-ray,  test to evaluate.   A 2-D echo with echo Doppler:  Left ventricle is normal in size.  End-  diastolic dimension of 44-mm, interventricular septum and posterior wall  are normal at 10-mm each.  Left atrium is normal at 38-mm, right atrium,  right ventricle are normal.  Aortic root is normal at 25-mm.   The aortic valve is normal with no insufficiency.   Mitral valve is mildly thickened, trace insufficiency.   Pulmonic valve is normal with no insufficiency.   Tricuspid valve is normal with no insufficiency.   Overall, LV systolic function is normal with an LVEF of approximately  65%.  RV EF is normal.   No pericardial effusion is seen.      Pricilla Riffle, MD, [facility]  Electronically Signed     PVR/MEDQ  D:  11/25/2006  T:  11/25/2006  Job:  [id number]

## 2010-11-27 ENCOUNTER — Other Ambulatory Visit: Payer: Self-pay

## 2010-11-27 DIAGNOSIS — R5381 Other malaise: Secondary | ICD-10-CM

## 2010-11-27 DIAGNOSIS — R7309 Other abnormal glucose: Secondary | ICD-10-CM

## 2010-11-27 DIAGNOSIS — I10 Essential (primary) hypertension: Secondary | ICD-10-CM

## 2010-12-01 ENCOUNTER — Other Ambulatory Visit: Payer: Self-pay | Admitting: Family Medicine

## 2011-01-01 ENCOUNTER — Other Ambulatory Visit: Payer: Self-pay | Admitting: Family Medicine

## 2011-01-02 LAB — BASIC METABOLIC PANEL
BUN: 20 mg/dL (ref 6–23)
CO2: 30 mEq/L (ref 19–32)
Calcium: 10.3 mg/dL (ref 8.4–10.5)
Chloride: 93 mEq/L — ABNORMAL LOW (ref 96–112)
Glucose, Bld: 105 mg/dL — ABNORMAL HIGH (ref 70–99)

## 2011-01-02 LAB — CBC WITH DIFFERENTIAL/PLATELET
Basophils Absolute: 0 10*3/uL (ref 0.0–0.1)
HCT: 37.6 % (ref 36.0–46.0)
Lymphocytes Relative: 31 % (ref 12–46)
MCHC: 32.4 g/dL (ref 30.0–36.0)
Monocytes Absolute: 0.8 10*3/uL (ref 0.1–1.0)
Monocytes Relative: 6 % (ref 3–12)
Neutrophils Relative %: 61 % (ref 43–77)
RDW: 13.8 % (ref 11.5–15.5)

## 2011-01-02 LAB — HEMOGLOBIN A1C
Hgb A1c MFr Bld: 6.4 % — ABNORMAL HIGH (ref <5.7)
Mean Plasma Glucose: 137 mg/dL — ABNORMAL HIGH (ref <117)

## 2011-01-02 LAB — TSH: TSH: 1.905 u[IU]/mL (ref 0.350–4.500)

## 2011-01-14 ENCOUNTER — Encounter: Payer: Self-pay | Admitting: Family Medicine

## 2011-01-15 ENCOUNTER — Other Ambulatory Visit: Payer: Self-pay | Admitting: Family Medicine

## 2011-01-15 LAB — HEPATIC FUNCTION PANEL
ALT: 11 U/L (ref 0–35)
AST: 14 U/L (ref 0–37)
Albumin: 4 g/dL (ref 3.5–5.2)
Alkaline Phosphatase: 95 U/L (ref 39–117)
Total Protein: 7.4 g/dL (ref 6.0–8.3)

## 2011-01-15 LAB — BASIC METABOLIC PANEL
BUN: 15 mg/dL (ref 6–23)
CO2: 27 mEq/L (ref 19–32)
Chloride: 100 mEq/L (ref 96–112)
Creat: 1.09 mg/dL (ref 0.50–1.10)
Sodium: 138 mEq/L (ref 135–145)

## 2011-01-15 LAB — LIPID PANEL
HDL: 51 mg/dL (ref >39)
VLDL: 18 mg/dL (ref 0–40)

## 2011-01-21 ENCOUNTER — Ambulatory Visit: Payer: BC Managed Care – PPO | Admitting: Family Medicine

## 2011-02-18 ENCOUNTER — Other Ambulatory Visit: Payer: Self-pay | Admitting: Family Medicine

## 2011-02-23 ENCOUNTER — Telehealth: Payer: Self-pay | Admitting: Family Medicine

## 2011-02-23 NOTE — Telephone Encounter (Signed)
One month of refills were sent today and front staff was advised patient needs an appt. She has not had an ov since Dec 2011

## 2011-02-24 NOTE — Telephone Encounter (Signed)
Appt. In September.

## 2011-02-26 ENCOUNTER — Other Ambulatory Visit: Payer: Self-pay | Admitting: Family Medicine

## 2011-03-15 ENCOUNTER — Other Ambulatory Visit: Payer: Self-pay | Admitting: Family Medicine

## 2011-03-17 ENCOUNTER — Encounter: Payer: Self-pay | Admitting: Family Medicine

## 2011-03-18 ENCOUNTER — Encounter: Payer: Self-pay | Admitting: Family Medicine

## 2011-03-18 ENCOUNTER — Ambulatory Visit (INDEPENDENT_AMBULATORY_CARE_PROVIDER_SITE_OTHER): Payer: BC Managed Care – PPO | Admitting: Family Medicine

## 2011-03-18 VITALS — BP 140/80 | HR 93 | Resp 16 | Ht 66.0 in | Wt 269.0 lb

## 2011-03-18 DIAGNOSIS — I10 Essential (primary) hypertension: Secondary | ICD-10-CM

## 2011-03-18 DIAGNOSIS — R7309 Other abnormal glucose: Secondary | ICD-10-CM

## 2011-03-18 DIAGNOSIS — M25569 Pain in unspecified knee: Secondary | ICD-10-CM

## 2011-03-18 DIAGNOSIS — Z13820 Encounter for screening for osteoporosis: Secondary | ICD-10-CM

## 2011-03-18 DIAGNOSIS — M25561 Pain in right knee: Secondary | ICD-10-CM

## 2011-03-18 DIAGNOSIS — J309 Allergic rhinitis, unspecified: Secondary | ICD-10-CM

## 2011-03-18 DIAGNOSIS — Z23 Encounter for immunization: Secondary | ICD-10-CM

## 2011-03-18 DIAGNOSIS — E785 Hyperlipidemia, unspecified: Secondary | ICD-10-CM

## 2011-03-18 DIAGNOSIS — N3090 Cystitis, unspecified without hematuria: Secondary | ICD-10-CM

## 2011-03-18 DIAGNOSIS — R7303 Prediabetes: Secondary | ICD-10-CM

## 2011-03-18 DIAGNOSIS — E559 Vitamin D deficiency, unspecified: Secondary | ICD-10-CM

## 2011-03-18 LAB — POCT URINALYSIS DIPSTICK
Bilirubin, UA: NEGATIVE
Glucose, UA: NEGATIVE
Ketones, UA: NEGATIVE
Spec Grav, UA: 1.02

## 2011-03-18 MED ORDER — PHENTERMINE HCL 37.5 MG PO TABS: 37.5000 mg | ORAL_TABLET | Freq: Every day | ORAL | Status: DC

## 2011-03-18 MED ORDER — METHYLPREDNISOLONE ACETATE 80 MG/ML IJ SUSP
80.0000 mg | Freq: Once | INTRAMUSCULAR | Status: AC
  Administered 2011-03-18: 80 mg via INTRAMUSCULAR

## 2011-03-18 MED ORDER — KETOROLAC TROMETHAMINE 60 MG/2ML IM SOLN
60.0000 mg | Freq: Once | INTRAMUSCULAR | Status: AC
  Administered 2011-03-18: 60 mg via INTRAMUSCULAR

## 2011-03-18 MED ORDER — IBUPROFEN 800 MG PO TABS: 800.0000 mg | ORAL_TABLET | Freq: Three times a day (TID) | ORAL | Status: DC

## 2011-03-18 MED ORDER — SIMVASTATIN 40 MG PO TABS: 40.0000 mg | ORAL_TABLET | Freq: Every day | ORAL | Status: DC

## 2011-03-18 MED ORDER — INFLUENZA VAC TYPES A & B PF IM SUSP: 0.5000 mL | Freq: Once | INTRAMUSCULAR | Status: DC

## 2011-03-18 MED ORDER — VITAMIN D (ERGOCALCIFEROL) 1.25 MG (50000 UNIT) PO CAPS: 50000.0000 [IU] | ORAL_CAPSULE | ORAL | Status: DC

## 2011-03-18 MED ORDER — METOPROLOL TARTRATE 25 MG PO TABS: 25.0000 mg | ORAL_TABLET | Freq: Two times a day (BID) | ORAL | Status: DC

## 2011-03-18 NOTE — Patient Instructions (Addendum)
CPE in November.  LABWORK  NEEDS TO BE DONE BETWEEN 3 TO 7 DAYS BEFORE YOUR NEXT SCEDULED  VISIT.  THIS WILL IMPROVE THE QUALITY OF YOUR CARE.   Pls call and go to diabetic class  Med sent in for knee pain, and injections will be given  Flu vaccine today.

## 2011-03-20 LAB — URINE CULTURE

## 2011-03-21 DIAGNOSIS — E559 Vitamin D deficiency, unspecified: Secondary | ICD-10-CM | POA: Insufficient documentation

## 2011-03-21 DIAGNOSIS — N3090 Cystitis, unspecified without hematuria: Secondary | ICD-10-CM | POA: Insufficient documentation

## 2011-03-21 DIAGNOSIS — E785 Hyperlipidemia, unspecified: Secondary | ICD-10-CM | POA: Insufficient documentation

## 2011-03-21 MED ORDER — TOLTERODINE TARTRATE ER 4 MG PO CP24: 4.0000 mg | ORAL_CAPSULE | Freq: Every day | ORAL | Status: DC

## 2011-03-21 NOTE — Assessment & Plan Note (Signed)
Continue prescription med until rept test done prior to next visit to establish status

## 2011-03-21 NOTE — Assessment & Plan Note (Addendum)
Unchanged. Patient re-educated about  the importance of commitment to a  minimum of 150 minutes of exercise per week. The importance of healthy food choices with portion control discussed. Encouraged to start a food diary, count calories and to consider  joining a support group. Sample diet sheets offered. Goals set by the patient for the next several months.    

## 2011-03-21 NOTE — Assessment & Plan Note (Signed)
Low fat diet discussed and encouraged, f/u on  labs

## 2011-03-21 NOTE — Assessment & Plan Note (Signed)
Increased symptoms, as expected due to season change, responds to prescription med

## 2011-03-21 NOTE — Progress Notes (Signed)
  Subjective:    Patient ID: Julie Ryan, female    DOB: 11/08/1965, 45 y.o.   MRN: [id number]  HPI  The PT is here for follow up and re-evaluation of chronic medical conditions, medication management and review of any available recent lab and radiology data.  Preventive health is updated, specifically  Cancer screening and Immunization.   Questions or concerns regarding consultations or procedures which the PT has had in the interim are  addressed. The PT denies any adverse reactions to current medications since the last visit.  C/o increased knee pain, which limits ability to exercise and overall mobility. Denies falls or instability   Review of Systems See HPI Denies recent fever or chills. Denies sinus pressure, nasal congestion, ear pain or sore throat. Denies chest congestion, productive cough or wheezing. Denies chest pains, palpitations and leg swelling Denies abdominal pain, nausea, vomiting,diarrhea or constipation.   C/o frequency and episodes of incontinence which are frustrating.Denies flank pain, has mild dysuria Denies headaches, seizures, numbness, or tingling. Denies uncontrolled  depression, anxiety or insomnia. Denies skin break down or rash. Frustrated with lack of weight loss, but motivated to working more consistently since she has deterioration in her HBa1c        Objective:   Physical Exam Patient alert and oriented and in no cardiopulmonary distress.  HEENT: No facial asymmetry, EOMI, no sinus tenderness,  oropharynx pink and moist.  Neck supple no adenopathy.  Chest: Clear to auscultation bilaterally.  CVS: S1, S2 no murmurs, no S3.  ABD: Soft non tender. Bowel sounds normal.  Ext: No edema  MS: Adequate ROM spine, shoulders, hips and reduced in knees.  Skin: Intact, no ulcerations or rash noted.  Psych: Good eye contact, normal affect. Memory intact not anxious or depressed appearing.  CNS: CN 2-12 intact, power, tone and sensation normal  throughout.        Assessment & Plan:

## 2011-03-21 NOTE — Assessment & Plan Note (Signed)
Frequency and increased incontinence, CCUA abnormal, if culture is negative will start detrol.

## 2011-03-21 NOTE — Assessment & Plan Note (Signed)
Controlled, no change in medication  

## 2011-03-21 NOTE — Assessment & Plan Note (Addendum)
Deteriorated, anti inflammatory injections administered and anti-inflammatory prescribed

## 2011-03-21 NOTE — Assessment & Plan Note (Signed)
Deteriorated, importance of dietary change and weight loss stressed to reverse this, pt to go to diabetic ed class also

## 2011-03-23 ENCOUNTER — Encounter (HOSPITAL_COMMUNITY): Payer: Self-pay | Admitting: Emergency Medicine

## 2011-03-23 ENCOUNTER — Emergency Department (HOSPITAL_COMMUNITY)
Admission: EM | Admit: 2011-03-23 | Discharge: 2011-03-23 | Disposition: A | Discharge status: Home / Self Care | Payer: BC Managed Care – PPO | Attending: Emergency Medicine | Admitting: Emergency Medicine

## 2011-03-23 DIAGNOSIS — B029 Zoster without complications: Secondary | ICD-10-CM | POA: Insufficient documentation

## 2011-03-23 DIAGNOSIS — F17200 Nicotine dependence, unspecified, uncomplicated: Secondary | ICD-10-CM | POA: Insufficient documentation

## 2011-03-23 DIAGNOSIS — I10 Essential (primary) hypertension: Secondary | ICD-10-CM | POA: Insufficient documentation

## 2011-03-23 MED ORDER — VALACYCLOVIR HCL 1 G PO TABS: 1000.0000 mg | ORAL_TABLET | Freq: Three times a day (TID) | ORAL | Status: AC

## 2011-03-23 NOTE — ED Provider Notes (Signed)
Scribed for Celene Kras, MD, the patient was seen in room APA07/APA07 . This chart was scribed by Ellie Lunch. This patient's care was started at 4:55 PM.   CSN: [id number] Arrival date & time: 03/23/2011  4:36 PM   Chief Complaint  Patient presents with  . Facial Swelling   (Include location/radiation/quality/duration/timing/severity/associated sxs/prior treatment) HPI Julie Ryan is a 45 y.o. female who presents to the Emergency Department complaining of left jaw swelling and rash to the left forehead starting 4 days.  Pt describes rash as itching and burning. No hx of similar sx. Pt denies fever, cough, n/v, ST.  No eye pain, redness. Pt denies being bitten by anything. There are no other associated symptoms and no other alleviating or aggravating factors.    Past Medical History  Diagnosis Date  . Anxiety   . Depression   . Obesity   . Hypertension   . Prediabetes     Past Surgical History  Procedure Date  . Tubal ligation   . Appendectomy     Family History  Problem Relation Age of Onset  . Hypertension Mother   . Heart disease Mother   . Diabetes Mother   . Diabetes Sister   . Diabetes Brother   . Hypertension Brother     History  Substance Use Topics  . Smoking status: Current Everyday Smoker -- 0.3 packs/day  . Smokeless tobacco: Not on file  . Alcohol Use: No   Review of Systems 10 Systems reviewed and are negative for acute change except as noted in the HPI.  Allergies  Codeine  Home Medications   Current Outpatient Rx  Name Route Sig Dispense Refill  . CETIRIZINE HCL 10 MG PO TABS  TAKE ONE TABLET DAILY. 30 tablet 0  . DEMADEX 100 MG PO TABS  TAKE 1 TABLET BY MOUTH   ONCE DAILY FOR FLUID. 30 each 0  . DOXEPIN HCL 10 MG PO CAPS  TAKE 1 OR 2 CAPSULES AT  BEDTIME FOR SLEEP. 60 capsule 0  . IBUPROFEN 800 MG PO TABS Oral Take 1 tablet (800 mg total) by mouth 3 (three) times daily. 90 tablet 0  . METOPROLOL TARTRATE 25 MG PO TABS Oral Take 1 tablet  (25 mg total) by mouth 2 (two) times daily. 60 tablet 3  . PHENTERMINE HCL 37.5 MG PO TABS Oral Take 1 tablet (37.5 mg total) by mouth daily before breakfast. 30 tablet 1  . SIMVASTATIN 40 MG PO TABS Oral Take 1 tablet (40 mg total) by mouth at bedtime. 30 tablet 3  . TOLTERODINE TARTRATE 4 MG PO CP24 Oral Take 1 capsule (4 mg total) by mouth daily. 30 capsule 2  . VITAMIN D (ERGOCALCIFEROL) 50000 UNITS PO CAPS Oral Take 1 capsule (50,000 Units total) by mouth every 7 (seven) days. 4 capsule 3    Physical Exam    BP 118/75  Pulse 96  Temp(Src) 98.7 F (37.1 C) (Oral)  Resp 18  Ht 5\' 6"  (1.676 m)  Wt 269 lb (122.018 kg)  BMI 43.42 kg/m2  SpO2 96%  LMP 03/04/2011  Physical Exam  Nursing note and vitals reviewed. Constitutional: She appears well-developed and well-nourished. No distress.  HENT:  Head: Normocephalic and atraumatic.  Right Ear: External ear normal.  Left Ear: External ear normal.  Eyes: Conjunctivae are normal. Right eye exhibits no discharge. Left eye exhibits no discharge. No scleral icterus.  Neck: Neck supple. No tracheal deviation present.  Cardiovascular: Normal rate, regular rhythm and  intact distal pulses.   Pulmonary/Chest: Effort normal and breath sounds normal. No stridor. No respiratory distress. She has no wheezes. She has no rales.  Abdominal: Soft. Bowel sounds are normal. She exhibits no distension. There is no tenderness. There is no rebound and no guarding.  Musculoskeletal: She exhibits no edema and no tenderness.  Neurological: She is alert. She has normal strength. No sensory deficit. Cranial nerve deficit:  no gross defecits noted. She exhibits normal muscle tone. She displays no seizure activity. Coordination normal.  Skin: Skin is warm and dry. Rash noted. Rash is papular and vesicular.       Mild papular/vesicular rash on left face.  With no involvement of left eye  Psychiatric: She has a normal mood and affect.   Procedures  OTHER DATA  REVIEWED: Nursing notes, vital signs, and past medical records reviewed.    DIAGNOSTIC STUDIES: Oxygen Saturation is 96% on room air, normal by my interpretation.     ED COURSE / COORDINATION OF CARE: 17:08 Discussed with Pt possibility of shingles, which is a reactivation of the chicken pox. Medication is really only good to start 48 hours after onset. Rash will get better on it's own. Discussed precautions: rash could be contagious with direct contact for people with compromised immune system, pregnant women, and small babies who have not had chicken pox vaccine.   MDM: Patient has a shingles rash. He does not appear to be any involvement of her high. Efficacymay be diminished but will prescribe her a course of Valtrex a clear.  SCRIBE ATTESTATION: I personally performed the services described in this documentation, which was scribed in my presence.  The recorded information has been reviewed and considered.        Celene Kras, MD 03/23/11 [phone]

## 2011-03-23 NOTE — ED Notes (Signed)
Pt c/o left jaw area swelling and rash to the left forehead since Friday.

## 2011-03-26 ENCOUNTER — Encounter: Payer: Self-pay | Admitting: Family Medicine

## 2011-04-08 ENCOUNTER — Other Ambulatory Visit: Payer: Self-pay | Admitting: Family Medicine

## 2011-05-14 ENCOUNTER — Other Ambulatory Visit: Payer: Self-pay | Admitting: Family Medicine

## 2011-05-26 ENCOUNTER — Encounter: Payer: BC Managed Care – PPO | Admitting: Family Medicine

## 2011-07-26 ENCOUNTER — Encounter: Payer: Self-pay | Admitting: Family Medicine

## 2011-07-28 ENCOUNTER — Encounter: Payer: BC Managed Care – PPO | Admitting: Family Medicine

## 2011-08-04 ENCOUNTER — Encounter: Payer: Self-pay | Admitting: Family Medicine

## 2011-08-05 ENCOUNTER — Ambulatory Visit (INDEPENDENT_AMBULATORY_CARE_PROVIDER_SITE_OTHER): Payer: Self-pay | Admitting: Family Medicine

## 2011-08-05 ENCOUNTER — Encounter: Payer: Self-pay | Admitting: Family Medicine

## 2011-08-05 VITALS — BP 120/78 | HR 80 | Resp 16 | Ht 66.0 in | Wt 266.0 lb

## 2011-08-05 DIAGNOSIS — R7303 Prediabetes: Secondary | ICD-10-CM | POA: Insufficient documentation

## 2011-08-05 DIAGNOSIS — N3090 Cystitis, unspecified without hematuria: Secondary | ICD-10-CM

## 2011-08-05 DIAGNOSIS — R0989 Other specified symptoms and signs involving the circulatory and respiratory systems: Secondary | ICD-10-CM

## 2011-08-05 DIAGNOSIS — R32 Unspecified urinary incontinence: Secondary | ICD-10-CM

## 2011-08-05 DIAGNOSIS — R0683 Snoring: Secondary | ICD-10-CM

## 2011-08-05 DIAGNOSIS — R7309 Other abnormal glucose: Secondary | ICD-10-CM

## 2011-08-05 DIAGNOSIS — R0609 Other forms of dyspnea: Secondary | ICD-10-CM

## 2011-08-05 DIAGNOSIS — J309 Allergic rhinitis, unspecified: Secondary | ICD-10-CM

## 2011-08-05 DIAGNOSIS — I10 Essential (primary) hypertension: Secondary | ICD-10-CM

## 2011-08-05 MED ORDER — DOXEPIN HCL 10 MG PO CAPS: ORAL_CAPSULE | ORAL | Status: DC

## 2011-08-05 MED ORDER — OXYBUTYNIN CHLORIDE 5 MG PO TABS: 5.0000 mg | ORAL_TABLET | Freq: Two times a day (BID) | ORAL | Status: DC

## 2011-08-05 MED ORDER — CETIRIZINE HCL 10 MG PO TABS: ORAL_TABLET | ORAL | Status: DC

## 2011-08-05 MED ORDER — IBUPROFEN 800 MG PO TABS: ORAL_TABLET | ORAL | Status: DC

## 2011-08-05 MED ORDER — METOPROLOL TARTRATE 25 MG PO TABS: 25.0000 mg | ORAL_TABLET | Freq: Two times a day (BID) | ORAL | Status: DC

## 2011-08-05 MED ORDER — SIMVASTATIN 40 MG PO TABS: 40.0000 mg | ORAL_TABLET | Freq: Every day | ORAL | Status: DC

## 2011-08-05 NOTE — Assessment & Plan Note (Signed)
Unchanged. Patient re-educated about  the importance of commitment to a  minimum of 150 minutes of exercise per week. The importance of healthy food choices with portion control discussed. Encouraged to start a food diary, count calories and to consider  joining a support group. Sample diet sheets offered. Goals set by the patient for the next several months.    

## 2011-08-05 NOTE — Assessment & Plan Note (Signed)
Controlled, no change in medication  

## 2011-08-05 NOTE — Patient Instructions (Addendum)
F/u in 6 month  hBa1C today.  You will get a 1500 cal diet sheet.  It is important that you exercise regularly at least 30 minutes 5 times a week. If you develop chest pain, have severe difficulty breathing, or feel very tired, stop exercising immediately and seek medical attention    Log into "My fitness pal" this helps with calorie counting.  New med oxybutynin for incontinence, also do the exercises discussed.  Fasting lipid, hepatic, hbA1C in 6 months  Pls let me know when you decide on the sleep study

## 2011-08-05 NOTE — Assessment & Plan Note (Signed)
Needs sleep study, she will call , uninsured currently

## 2011-08-08 NOTE — Progress Notes (Signed)
  Subjective:    Patient ID: Julie Ryan, female    DOB: 1966-04-21, 46 y.o.   MRN: [id number]  HPI  The PT is here for follow up and re-evaluation of chronic medical conditions, medication management and review of any available recent lab and radiology data.  Preventive health is updated, specifically  Cancer screening and Immunization.   Questions or concerns regarding consultations or procedures which the PT has had in the interim are  addressed. The PT denies any adverse reactions to current medications since the last visit.  C/o uncontrolled urinary incontinence esp with sexual arousal also with the urge to go, will try meds States she is told she stops breathing and snores excessively during sleep, also report chronic fatigue, needs sleep study, unable to afford now but will call back    Review of Systems See HPI Denies recent fever or chills. Denies sinus pressure, nasal congestion, ear pain or sore throat. Denies chest congestion, productive cough or wheezing. Denies chest pains, palpitations and leg swelling Denies abdominal pain, nausea, vomiting,diarrhea or constipation.   Denies dysuria, frequency, hesitancy or incontinence. Denies joint pain, swelling and limitation in mobility. Denies headaches, seizures, numbness, or tingling. Denies depression, anxiety or insomnia. Denies skin break down or rash.        Objective:   Physical Exam Patient alert and oriented and in no cardiopulmonary distress.  HEENT: No facial asymmetry, EOMI, no sinus tenderness,  oropharynx pink and moist.  Neck supple no adenopathy.  Chest: Clear to auscultation bilaterally.  CVS: S1, S2 no murmurs, no S3.  ABD: Soft non tender. Bowel sounds normal.  Ext: No edema  MS: Adequate ROM spine, shoulders, hips and knees.  Skin: Intact, no ulcerations or rash noted.  Psych: Good eye contact, normal affect. Memory intact not anxious or depressed appearing.  CNS: CN 2-12 intact, power,  tone and sensation normal throughout.        Assessment & Plan:

## 2011-08-08 NOTE — Assessment & Plan Note (Signed)
Reports incontinence with urge and also during intercourse, rial of med for symptoms, also kiegel exercises discussed

## 2011-08-08 NOTE — Assessment & Plan Note (Signed)
No recent flare of uncontroled symptoms

## 2011-08-08 NOTE — Assessment & Plan Note (Signed)
Improved, no med at this time, pt to continue dietary management, and improve on this as well as weight loss and exercise

## 2011-08-08 NOTE — Assessment & Plan Note (Signed)
Prt to start med for this

## 2011-08-10 ENCOUNTER — Telehealth: Payer: Self-pay | Admitting: Family Medicine

## 2011-08-16 NOTE — Telephone Encounter (Signed)
Pt aware. And is aware of d/c of metformin

## 2011-11-17 ENCOUNTER — Other Ambulatory Visit: Payer: Self-pay | Admitting: Family Medicine

## 2012-01-13 ENCOUNTER — Other Ambulatory Visit: Payer: Self-pay | Admitting: Family Medicine

## 2012-01-24 ENCOUNTER — Other Ambulatory Visit: Payer: Self-pay

## 2012-01-24 ENCOUNTER — Telehealth: Payer: Self-pay | Admitting: Family Medicine

## 2012-01-24 DIAGNOSIS — I10 Essential (primary) hypertension: Secondary | ICD-10-CM

## 2012-01-24 MED ORDER — METOPROLOL TARTRATE 25 MG PO TABS: 25.0000 mg | ORAL_TABLET | Freq: Two times a day (BID) | ORAL | Status: DC

## 2012-01-24 MED ORDER — SIMVASTATIN 40 MG PO TABS: 40.0000 mg | ORAL_TABLET | Freq: Every day | ORAL | Status: DC

## 2012-01-24 NOTE — Telephone Encounter (Signed)
meds refilled 

## 2012-02-02 ENCOUNTER — Ambulatory Visit: Payer: Self-pay | Admitting: Family Medicine

## 2012-03-20 ENCOUNTER — Emergency Department (HOSPITAL_COMMUNITY)
Admission: EM | Admit: 2012-03-20 | Discharge: 2012-03-20 | Disposition: A | Discharge status: Home / Self Care | Payer: Self-pay | Attending: Emergency Medicine | Admitting: Emergency Medicine

## 2012-03-20 ENCOUNTER — Emergency Department (HOSPITAL_COMMUNITY): Payer: Self-pay

## 2012-03-20 ENCOUNTER — Encounter (HOSPITAL_COMMUNITY): Payer: Self-pay | Admitting: *Deleted

## 2012-03-20 DIAGNOSIS — Z91040 Latex allergy status: Secondary | ICD-10-CM | POA: Insufficient documentation

## 2012-03-20 DIAGNOSIS — E669 Obesity, unspecified: Secondary | ICD-10-CM | POA: Insufficient documentation

## 2012-03-20 DIAGNOSIS — F411 Generalized anxiety disorder: Secondary | ICD-10-CM | POA: Insufficient documentation

## 2012-03-20 DIAGNOSIS — F3289 Other specified depressive episodes: Secondary | ICD-10-CM | POA: Insufficient documentation

## 2012-03-20 DIAGNOSIS — Z833 Family history of diabetes mellitus: Secondary | ICD-10-CM | POA: Insufficient documentation

## 2012-03-20 DIAGNOSIS — M1712 Unilateral primary osteoarthritis, left knee: Secondary | ICD-10-CM

## 2012-03-20 DIAGNOSIS — M1710 Unilateral primary osteoarthritis, unspecified knee: Secondary | ICD-10-CM | POA: Insufficient documentation

## 2012-03-20 DIAGNOSIS — F17200 Nicotine dependence, unspecified, uncomplicated: Secondary | ICD-10-CM | POA: Insufficient documentation

## 2012-03-20 DIAGNOSIS — Z885 Allergy status to narcotic agent status: Secondary | ICD-10-CM | POA: Insufficient documentation

## 2012-03-20 DIAGNOSIS — R7309 Other abnormal glucose: Secondary | ICD-10-CM | POA: Insufficient documentation

## 2012-03-20 DIAGNOSIS — F329 Major depressive disorder, single episode, unspecified: Secondary | ICD-10-CM | POA: Insufficient documentation

## 2012-03-20 DIAGNOSIS — Z8249 Family history of ischemic heart disease and other diseases of the circulatory system: Secondary | ICD-10-CM | POA: Insufficient documentation

## 2012-03-20 DIAGNOSIS — IMO0002 Reserved for concepts with insufficient information to code with codable children: Secondary | ICD-10-CM | POA: Insufficient documentation

## 2012-03-20 DIAGNOSIS — I10 Essential (primary) hypertension: Secondary | ICD-10-CM | POA: Insufficient documentation

## 2012-03-20 MED ORDER — HYDROCODONE-ACETAMINOPHEN 7.5-325 MG PO TABS: 1.0000 | ORAL_TABLET | ORAL | Status: AC | PRN

## 2012-03-20 MED ORDER — PROMETHAZINE HCL 12.5 MG PO TABS: 12.5000 mg | ORAL_TABLET | Freq: Four times a day (QID) | ORAL | Status: DC | PRN

## 2012-03-20 NOTE — ED Notes (Signed)
Lt knee pain for 3 mos, worse last pm. Injury.

## 2012-03-20 NOTE — ED Provider Notes (Signed)
History     CSN: [id number]  Arrival date & time 03/20/12  1210   First MD Initiated Contact with Patient 03/20/12 1331      Chief Complaint  Patient presents with  . Knee Pain    (Consider location/radiation/quality/duration/timing/severity/associated sxs/prior treatment) HPI Comments: Patient states she's been having problems with her left knee on off and on for about 3 months. The pain got progressively worse on on last night. The patient now has discomfort with standing and attempting to walk. She's not had any injury to the knee. She's not had any previous operations or procedures to the knee. Patient presents at this time because she's had a different type of pain in the knee that affects her activities of daily living.  The history is provided by the patient.    Past Medical History  Diagnosis Date  . Anxiety   . Depression   . Obesity   . Hypertension   . Prediabetes     Past Surgical History  Procedure Date  . Tubal ligation   . Appendectomy     Family History  Problem Relation Age of Onset  . Hypertension Mother   . Heart disease Mother   . Diabetes Mother   . Diabetes Sister   . Diabetes Brother   . Hypertension Brother     History  Substance Use Topics  . Smoking status: Current Every Day Smoker -- 0.3 packs/day  . Smokeless tobacco: Not on file  . Alcohol Use: No    OB History    Grav Para Term Preterm Abortions TAB SAB Ect Mult Living                  Review of Systems  Constitutional: Negative for activity change.       All ROS Neg except as noted in HPI  HENT: Negative for nosebleeds and neck pain.   Eyes: Negative for photophobia and discharge.  Respiratory: Negative for cough, shortness of breath and wheezing.   Cardiovascular: Negative for chest pain and palpitations.  Gastrointestinal: Negative for abdominal pain and blood in stool.  Genitourinary: Negative for dysuria, frequency and hematuria.  Musculoskeletal: Positive for  arthralgias. Negative for back pain.  Skin: Negative.   Neurological: Negative for dizziness, seizures and speech difficulty.  Psychiatric/Behavioral: Negative for hallucinations and confusion. The patient is nervous/anxious.     Allergies  Codeine; Hydrocodone; and Latex  Home Medications   Current Outpatient Rx  Name Route Sig Dispense Refill  . CETIRIZINE HCL 10 MG PO TABS Oral Take 10 mg by mouth daily.    Marland Kitchen DOXEPIN HCL 10 MG PO CAPS Oral Take 10 mg by mouth at bedtime as needed. Sleep    . IBUPROFEN 800 MG PO TABS Oral Take 800 mg by mouth every 6 (six) hours as needed.     Marland Kitchen METOPROLOL TARTRATE 25 MG PO TABS Oral Take 1 tablet (25 mg total) by mouth 2 (two) times daily. 60 tablet 0  . OXYBUTYNIN CHLORIDE 5 MG PO TABS Oral Take 5 mg by mouth 2 (two) times daily.     Marland Kitchen SIMVASTATIN 40 MG PO TABS Oral Take 1 tablet (40 mg total) by mouth at bedtime. 30 tablet 0  . TOLTERODINE TARTRATE ER 4 MG PO CP24 Oral Take 1 capsule (4 mg total) by mouth daily. 30 capsule 2  . TORSEMIDE 100 MG PO TABS Oral Take 100 mg by mouth daily.    Marland Kitchen VALACYCLOVIR HCL 1 G PO TABS Oral Take  1 tablet (1,000 mg total) by mouth 3 (three) times daily. 21 tablet 0  . VITAMIN D (ERGOCALCIFEROL) 50000 UNITS PO CAPS Oral Take 50,000 Units by mouth every 7 (seven) days. Saturday      BP 111/68  Pulse 90  Temp 99 F (37.2 C) (Oral)  Resp 20  Ht 5\' 6"  (1.676 m)  Wt 251 lb (113.853 kg)  BMI 40.51 kg/m2  SpO2 99%  LMP 02/18/2012  Physical Exam  Nursing note and vitals reviewed. Constitutional: She is oriented to person, place, and time. She appears well-developed and well-nourished.  Non-toxic appearance.  HENT:  Head: Normocephalic.  Right Ear: Tympanic membrane and external ear normal.  Left Ear: Tympanic membrane and external ear normal.  Eyes: EOM and lids are normal. Pupils are equal, round, and reactive to light.  Neck: Normal range of motion. Neck supple. Carotid bruit is not present.  Cardiovascular:  Normal rate, regular rhythm, normal heart sounds, intact distal pulses and normal pulses.   Pulmonary/Chest: Breath sounds normal. No respiratory distress.  Abdominal: Soft. Bowel sounds are normal. There is no tenderness. There is no guarding.  Musculoskeletal: Normal range of motion.       There is mild effusion of the left knee. The left knee is warm but not hot. There no posterior mass appreciated. There is a negative Homans sign. The patella tracks in the midline. There is crepitus present. The distal pulses are 2+ and symmetrical.  Lymphadenopathy:       Head (right side): No submandibular adenopathy present.       Head (left side): No submandibular adenopathy present.    She has no cervical adenopathy.  Neurological: She is alert and oriented to person, place, and time. She has normal strength. No cranial nerve deficit or sensory deficit.  Skin: Skin is warm and dry.  Psychiatric: She has a normal mood and affect. Her speech is normal.    ED Course  Procedures (including critical care time)  Labs Reviewed - No data to display Dg Knee Complete 4 Views Left  03/20/2012  *RADIOLOGY REPORT*  Clinical Data: Left knee pain around patella for 3 months, increased for 2 days  LEFT KNEE - COMPLETE 4+ VIEW  Comparison: None  Findings: Medial compartment joint space narrowing and marginal spur formation. Osseous mineralization normal. No acute fracture, dislocation, or bone destruction. No knee joint effusion.  IMPRESSION: Degenerative changes at medial compartment left knee. No definite acute abnormalities.   Original Report Authenticated By: Lollie Marrow, M.D.      No diagnosis found.    MDM  I have reviewed nursing notes, vital signs, and all appropriate lab and imaging results for this patient. The x-ray of the left knee reveals degenerative changes at the medial compartment as well as a marginal spur formation. The patient is treated with ibuprofen 800 mg 3 times daily from her primary  physician, or add Norco every 4 hours for additional pain and #20.       Kathie Dike, [place] 03/20/12 1440

## 2012-03-21 NOTE — ED Provider Notes (Signed)
Medical screening examination/treatment/procedure(s) were performed by non-physician practitioner and as supervising physician I was immediately available for consultation/collaboration.   Liliana Brentlinger W. Lyndsi Altic, MD 03/21/12 2132 

## 2012-04-11 ENCOUNTER — Other Ambulatory Visit (HOSPITAL_COMMUNITY): Payer: Self-pay | Admitting: Nurse Practitioner

## 2012-04-11 DIAGNOSIS — Z139 Encounter for screening, unspecified: Secondary | ICD-10-CM

## 2012-04-18 ENCOUNTER — Ambulatory Visit (HOSPITAL_COMMUNITY)
Admission: RE | Admit: 2012-04-18 | Discharge: 2012-04-18 | Disposition: A | Discharge status: Home / Self Care | Payer: Self-pay | Source: Ambulatory Visit | Attending: Nurse Practitioner | Admitting: Nurse Practitioner

## 2012-04-18 DIAGNOSIS — Z139 Encounter for screening, unspecified: Secondary | ICD-10-CM

## 2012-11-20 ENCOUNTER — Encounter: Payer: Self-pay | Admitting: Family Medicine

## 2012-11-20 ENCOUNTER — Ambulatory Visit (INDEPENDENT_AMBULATORY_CARE_PROVIDER_SITE_OTHER): Payer: BC Managed Care – PPO | Admitting: Family Medicine

## 2012-11-20 VITALS — BP 118/74 | HR 86 | Resp 16 | Ht 66.0 in | Wt 255.4 lb

## 2012-11-20 DIAGNOSIS — R5381 Other malaise: Secondary | ICD-10-CM

## 2012-11-20 DIAGNOSIS — I10 Essential (primary) hypertension: Secondary | ICD-10-CM

## 2012-11-20 DIAGNOSIS — E559 Vitamin D deficiency, unspecified: Secondary | ICD-10-CM

## 2012-11-20 DIAGNOSIS — R7309 Other abnormal glucose: Secondary | ICD-10-CM

## 2012-11-20 DIAGNOSIS — G4730 Sleep apnea, unspecified: Secondary | ICD-10-CM

## 2012-11-20 DIAGNOSIS — J309 Allergic rhinitis, unspecified: Secondary | ICD-10-CM

## 2012-11-20 DIAGNOSIS — R7303 Prediabetes: Secondary | ICD-10-CM

## 2012-11-20 DIAGNOSIS — G4700 Insomnia, unspecified: Secondary | ICD-10-CM

## 2012-11-20 DIAGNOSIS — E785 Hyperlipidemia, unspecified: Secondary | ICD-10-CM

## 2012-11-20 LAB — COMPREHENSIVE METABOLIC PANEL
BUN: 11 mg/dL (ref 6–23)
CO2: 27 mEq/L (ref 19–32)
Calcium: 9.5 mg/dL (ref 8.4–10.5)
Creat: 0.73 mg/dL (ref 0.50–1.10)
Glucose, Bld: 79 mg/dL (ref 70–99)
Total Bilirubin: 0.6 mg/dL (ref 0.3–1.2)

## 2012-11-20 LAB — CBC WITH DIFFERENTIAL/PLATELET
Basophils Absolute: 0 10*3/uL (ref 0.0–0.1)
Basophils Relative: 0 % (ref 0–1)
Eosinophils Absolute: 0.2 10*3/uL (ref 0.0–0.7)
Hemoglobin: 12.1 g/dL (ref 12.0–15.0)
MCH: 28.7 pg (ref 26.0–34.0)
MCHC: 33.2 g/dL (ref 30.0–36.0)
Monocytes Relative: 6 % (ref 3–12)
Neutro Abs: 6.3 10*3/uL (ref 1.7–7.7)
Neutrophils Relative %: 57 % (ref 43–77)
Platelets: 413 10*3/uL — ABNORMAL HIGH (ref 150–400)
RDW: 14.7 % (ref 11.5–15.5)

## 2012-11-20 LAB — LIPID PANEL
HDL: 49 mg/dL (ref >39)
LDL Cholesterol: 105 mg/dL — ABNORMAL HIGH (ref 0–99)
Total CHOL/HDL Ratio: 3.5 Ratio
VLDL: 17 mg/dL (ref 0–40)

## 2012-11-20 MED ORDER — PHENTERMINE HCL 37.5 MG PO TBDP: 1.0000 | ORAL_TABLET | Freq: Every day | ORAL | Status: DC

## 2012-11-20 MED ORDER — TEMAZEPAM 15 MG PO CAPS: 15.0000 mg | ORAL_CAPSULE | Freq: Every evening | ORAL | Status: DC | PRN

## 2012-11-20 NOTE — Patient Instructions (Addendum)
CPE in 4 month.  You are referred for sleep study.  You are referred to nutritionist for individual counselling.  We will provide 1500 calorie diet sheet  Please commit to 30 minutes exercise daily  Fasting CBC, lipid, hBA1C , tsh and vit D, and cmp today.  Start phentermine half tablet once daily to help with appetite suppression, take before breakfast, you NEED to eat 3 meals daily. If you develop palpitations, chest pain , or shortness of breath stop the phentermine and let me know  You wiil get info on sleep hygiene, and start restoril at night to help with sleep  Log into my chart please  Weigth loss goal of 12 to 16 pounds

## 2012-11-20 NOTE — Progress Notes (Signed)
  Subjective:    Patient ID: Julie Ryan, female    DOB: 1965/08/27, 47 y.o.   MRN: [id number]  HPI The PT is here for follow up and re-evaluation of chronic medical conditions, medication management and review of any available recent lab and radiology data.  Preventive health is updated, specifically  Cancer screening and Immunization.    The PT denies any adverse reactions to current medications since the last visit.  C/o excessive daytime sleepiness and snoring ready to get tested for sleep apnea    Review of Systems See HPI Denies recent fever or chills. Denies sinus pressure, nasal congestion, ear pain or sore throat. Denies chest congestion, productive cough or wheezing. Denies chest pains, palpitations and leg swelling Denies abdominal pain, nausea, vomiting,diarrhea or constipation.   Denies dysuria, frequency, hesitancy or incontinence. Denies joint pain, swelling and limitation in mobility. Denies headaches, seizures, numbness, or tingling. Denies uncontrolled depression or , but does c/o insomnia. Denies skin break down or rash.        Objective:   Physical Exam Patient alert and oriented and in no cardiopulmonary distress.  HEENT: No facial asymmetry, EOMI, no sinus tenderness,  oropharynx pink and moist.  Neck supple no adenopathy.  Chest: Clear to auscultation bilaterally.  CVS: S1, S2 no murmurs, no S3.  ABD: Soft non tender. Bowel sounds normal.  Ext: No edema  MS: Adequate ROM spine, shoulders, hips and knees.  Skin: Intact, no ulcerations or rash noted.  Psych: Good eye contact, normal affect. Memory intact not anxious or depressed appearing.  CNS: CN 2-12 intact, power, tone and sensation normal throughout.        Assessment & Plan:

## 2012-11-21 LAB — HEMOGLOBIN A1C: Hgb A1c MFr Bld: 5.8 % — ABNORMAL HIGH (ref <5.7)

## 2012-11-22 ENCOUNTER — Telehealth (HOSPITAL_COMMUNITY): Payer: Self-pay | Admitting: Dietician

## 2012-11-22 NOTE — Telephone Encounter (Signed)
Noted  

## 2012-11-22 NOTE — Telephone Encounter (Signed)
Received referral from [facility] for dx: obesity, prediabetes.

## 2012-11-22 NOTE — Telephone Encounter (Signed)
Called pt at 1022. Appointment scheduled for 11/23/12 at 1100.

## 2012-11-23 ENCOUNTER — Encounter (HOSPITAL_COMMUNITY): Payer: Self-pay | Admitting: Dietician

## 2012-11-23 NOTE — Progress Notes (Signed)
Outpatient Initial Nutrition Assessment  Date:11/23/2012   Appt Start Time: 1050  Referring Physician: Dr. Lodema Hong Reason for Visit: obesity, prediabetes  Nutrition Assessment:  Height: 5\' 6"  (167.6 cm)   Weight: 249 lb (112.946 kg)   IBW: 130#  %IBW: 191% UBW: 265# %UBW: 94% Body mass index is 40.21 kg/(m^2). Classified as obesity, class III. Goal Weight: 224# (10% loss of current body weight) Weight hx: Pt reports maintenance of wt of 165# up until 2007. She reports progressive weight gain since then, due to psychosocial and socioeconomic difficulties. Her highest weight was in 12/11 at 270#. She has successfully lost some weight since then.  Wt Readings from Last 10 Encounters:  11/23/12 249 lb (112.946 kg)  11/20/12 255 lb 6.4 oz (115.849 kg)  03/20/12 251 lb (113.853 kg)  08/05/11 266 lb (120.657 kg)  03/23/11 269 lb (122.018 kg)  03/18/11 269 lb (122.018 kg)  09/15/10 270 lb 8 oz (122.698 kg)  06/24/10 270 lb 8 oz (122.698 kg)  04/16/10 264 lb 4 oz (119.863 kg)  03/17/10 277 lb 4 oz (125.76 kg)    Estimated nutritional needs:   PMH:  Past Medical History  Diagnosis Date  . Anxiety   . Depression   . Obesity   . Hypertension   . Prediabetes     Medications:  Current Outpatient Rx  Name  Route  Sig  Dispense  Refill  . cetirizine (ZYRTEC) 10 MG tablet   Oral   Take 10 mg by mouth daily.         . fluticasone (FLONASE) 50 MCG/ACT nasal spray   Nasal   Place 2 sprays into the nose daily.         . furosemide (LASIX) 40 MG tablet   Oral   Take 40 mg by mouth daily.         Marland Kitchen ibuprofen (ADVIL,MOTRIN) 800 MG tablet   Oral   Take 800 mg by mouth every 6 (six) hours as needed.          . lovastatin (MEVACOR) 20 MG tablet   Oral   Take 40 mg by mouth at bedtime.         . metoprolol tartrate (LOPRESSOR) 25 MG tablet   Oral   Take 1 tablet (25 mg total) by mouth 2 (two) times daily.   60 tablet   0   . Phentermine HCl 37.5 MG TBDP   Oral  Take 1 tablet by mouth daily before breakfast.   30 tablet   3   . ranitidine (ZANTAC) 300 MG tablet   Oral   Take 300 mg by mouth at bedtime.         . temazepam (RESTORIL) 15 MG capsule   Oral   Take 1 capsule (15 mg total) by mouth at bedtime as needed for sleep.   30 capsule   3     Labs: CMP     Component Value Date/Time   NA 135 11/20/2012 1200   K 4.2 11/20/2012 1200   CL 101 11/20/2012 1200   CO2 27 11/20/2012 1200   GLUCOSE 79 11/20/2012 1200   BUN 11 11/20/2012 1200   CREATININE 0.73 11/20/2012 1200   CREATININE 0.81 06/25/2010 1900   CALCIUM 9.5 11/20/2012 1200   PROT 7.4 11/20/2012 1200   ALBUMIN 4.1 11/20/2012 1200   AST 14 11/20/2012 1200   ALT 10 11/20/2012 1200   ALKPHOS 92 11/20/2012 1200   BILITOT 0.6 11/20/2012 1200  Lipid Panel     Component Value Date/Time   CHOL 171 11/20/2012 1200   TRIG 85 11/20/2012 1200   HDL 49 11/20/2012 1200   CHOLHDL 3.5 11/20/2012 1200   VLDL 17 11/20/2012 1200   LDLCALC 105* 11/20/2012 1200     Lab Results  Component Value Date   HGBA1C 5.8* 11/20/2012   HGBA1C 6.0* 08/05/2011   HGBA1C 6.4* 01/15/2011   Lab Results  Component Value Date   LDLCALC 105* 11/20/2012   CREATININE 0.73 11/20/2012     Lifestyle/ social habits: Ms. Haller is a very pleasant lady who resides in [place]. She resides in [place]; she just recently moved in to a new home. 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 She reports that her stress level is high, rating an 8/10, due to family and financial issues. 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 She recently started exercising with her co-workers (just started Monday). She has been doing exercise videos daily and hopes to continue this daily.   Nutrition hx/habits: Ms. Spilde has gradually lost some weight in the past few years (highest wt was 270 in 12/11). She  reports a progressive weight gain since 2007, citing psychosocial and socioeconomic difficulties, such as switch jobs and the death of her father and mother, in 2010 and 2012, respectively. She reports she was successful with weight loss around 10 years ago, when she was prescribed Meridia, but struggled when it was taken off the market. She also reports weight loss last year from participating in the "Walk Yourself Slim" DVDs with her church community.  She reports the past few weeks have been stressful for her as she has just moved into a new home. She is still trying to unpack and get organized and, unfortunately, the majority of her food has come from fast food restaurants. She reports she will start cooking at home once she was settled and actually prepared meals for the week and frozen extra batches and stored them in her freezer. She has decreased the amount of soda and tea she consumes, but has not eliminated in its entirety, although she inquired about alternative beverages.   Diet recall: Breakfast (6:30 AM): soda or tea, (skips breakfast 3-4 times per week- will sometimes eat 2 boiled eggs OR oatmeal); Lunch: 1/2 cup of coffee, water/tea/soda, 1/2 bag of potato chips; Dinner: fried prok chop OR grilled chikcen salad OR chicken wrap.   Nutrition Diagnosis: Inconsistent carbohydrate intake r/t skipping meals AEB diet recall, Hgb A1c: 5.8.   Nutrition Intervention: Nutrition rx: 1300 kcal NAS, no sugar added diet; 3 meals per day (4-5 hours apart); eat around the same time each day; no snacks; low calorie beverages only; 30 minutes physical activity daily  Education/Counseling Provided: Educated pt on principles of weight management. Discussed principles of energy expenditure and how changes in diet and physical activity affect weight status. Reviewed labs and wt hx and discussed how elevated Hgb A1C and obesity puts pt at higher risk for developing diabetes.  Discussed nutritional content of  commonly eaten foods and suggested healthier alternatives. Educated pt on plate method and a general, healthful diet that includes low fat dairy, lean meats, whole fruits and vegetables, and whole grains most often. Stressed importance of developing a consistent meal schedule to assist with weight loss an glycemic goals. Discussed importance of a healthy  diet along with regular physical activity (at least 30 minutes 5 times per week) to achieve weight loss goals. Encouraged slow, moderate weight loss (0.5-2# weight loss per week) and adopting healthy lifestyle changes vs. obtaining a certain body type or weight. Encouraged weighing self weekly at a consistent day and time of choice. Showed pt functionality of MyFitnessPal and encouraged using a food diary to better track caloric intake. Provided AND Nutrition Care Manual handout "Weight Loss Tops". Used TeachBack to assess understanding.   Understanding, Motivation, Ability to Follow Recommendations: Expect fair to good compliance.   Monitoring and Evaluation: Goals: 1) 1-2# wt loss per week; 2) 30 minutes physical activity daily; 3) 3 meals per day  Recommendations: 1) For weight loss: 1200-1300 kcals daily; 2) Continue exercising with friends and family; 3) Keep food diary (ex. MyFitnessPal); 4) Use low calorie drink mixes or add fruit to water for flavor in place of tea and soda; 5) Eat meals around the same time daily  F/U: 6-8 weeks. F/U 01/09/13 at 0900.   Melody Haver, RD, LDN 11/23/2012  Appt EndTime:

## 2012-12-03 NOTE — Assessment & Plan Note (Signed)
Controlled, no change in medication  

## 2012-12-03 NOTE — Assessment & Plan Note (Signed)
Uncontrolled, sleep hygiene reviewed, pt to take medication

## 2012-12-03 NOTE — Assessment & Plan Note (Signed)
Deteriorated Hyperlipidemia:Low fat diet discussed and encouraged.   

## 2012-12-03 NOTE — Assessment & Plan Note (Signed)
Controlled, no change in medication DASH diet and commitment to daily physical activity for a minimum of 30 minutes discussed and encouraged, as a part of hypertension management. The importance of attaining a healthy weight is also discussed.  

## 2012-12-03 NOTE — Assessment & Plan Note (Addendum)
Improved. Pt applauded on succesful weight loss through lifestyle change, and encouraged to continue same. Weight loss goal set for the next several months.  

## 2012-12-03 NOTE — Assessment & Plan Note (Signed)
H/o excessive daytime sleepiness Referred for sleep study

## 2012-12-03 NOTE — Assessment & Plan Note (Signed)
Improved Patient educated about the importance of limiting  Carbohydrate intake , the need to commit to daily physical activity for a minimum of 30 minutes , and to commit weight loss. The fact that changes in all these areas will reduce or eliminate all together the development of diabetes is stressed.    

## 2012-12-13 ENCOUNTER — Institutional Professional Consult (permissible substitution): Payer: BC Managed Care – PPO | Admitting: Pulmonary Disease

## 2013-01-09 ENCOUNTER — Telehealth (HOSPITAL_COMMUNITY): Payer: Self-pay | Admitting: Dietician

## 2013-01-09 NOTE — Telephone Encounter (Signed)
Pt was a no-show for appointment scheduled for 01/09/2013 at 0900. Sent letter to pt home notifying pt of no-show and requesting rescheduling appointment.

## 2013-01-12 ENCOUNTER — Institutional Professional Consult (permissible substitution): Payer: BC Managed Care – PPO | Admitting: Pulmonary Disease

## 2013-03-22 ENCOUNTER — Encounter: Payer: Self-pay | Admitting: Family Medicine

## 2013-03-22 ENCOUNTER — Ambulatory Visit (INDEPENDENT_AMBULATORY_CARE_PROVIDER_SITE_OTHER): Payer: BC Managed Care – PPO | Admitting: Family Medicine

## 2013-03-22 ENCOUNTER — Other Ambulatory Visit (HOSPITAL_COMMUNITY)
Admission: RE | Admit: 2013-03-22 | Discharge: 2013-03-22 | Disposition: A | Discharge status: Home / Self Care | Payer: BC Managed Care – PPO | Source: Ambulatory Visit | Attending: Family Medicine | Admitting: Family Medicine

## 2013-03-22 VITALS — BP 124/78 | HR 81 | Resp 16 | Wt 247.1 lb

## 2013-03-22 DIAGNOSIS — F411 Generalized anxiety disorder: Secondary | ICD-10-CM

## 2013-03-22 DIAGNOSIS — Z23 Encounter for immunization: Secondary | ICD-10-CM

## 2013-03-22 DIAGNOSIS — F3289 Other specified depressive episodes: Secondary | ICD-10-CM

## 2013-03-22 DIAGNOSIS — G4700 Insomnia, unspecified: Secondary | ICD-10-CM

## 2013-03-22 DIAGNOSIS — Z124 Encounter for screening for malignant neoplasm of cervix: Secondary | ICD-10-CM

## 2013-03-22 DIAGNOSIS — F329 Major depressive disorder, single episode, unspecified: Secondary | ICD-10-CM

## 2013-03-22 DIAGNOSIS — Z1151 Encounter for screening for human papillomavirus (HPV): Secondary | ICD-10-CM | POA: Insufficient documentation

## 2013-03-22 DIAGNOSIS — A599 Trichomoniasis, unspecified: Secondary | ICD-10-CM

## 2013-03-22 DIAGNOSIS — I10 Essential (primary) hypertension: Secondary | ICD-10-CM

## 2013-03-22 DIAGNOSIS — Z113 Encounter for screening for infections with a predominantly sexual mode of transmission: Secondary | ICD-10-CM | POA: Insufficient documentation

## 2013-03-22 DIAGNOSIS — K219 Gastro-esophageal reflux disease without esophagitis: Secondary | ICD-10-CM

## 2013-03-22 DIAGNOSIS — M25562 Pain in left knee: Secondary | ICD-10-CM

## 2013-03-22 DIAGNOSIS — Z01419 Encounter for gynecological examination (general) (routine) without abnormal findings: Secondary | ICD-10-CM | POA: Insufficient documentation

## 2013-03-22 DIAGNOSIS — Z0000 Encounter for general adult medical examination without abnormal findings: Secondary | ICD-10-CM

## 2013-03-22 DIAGNOSIS — A5901 Trichomonal vulvovaginitis: Secondary | ICD-10-CM

## 2013-03-22 DIAGNOSIS — Z1211 Encounter for screening for malignant neoplasm of colon: Secondary | ICD-10-CM

## 2013-03-22 DIAGNOSIS — E559 Vitamin D deficiency, unspecified: Secondary | ICD-10-CM

## 2013-03-22 DIAGNOSIS — N760 Acute vaginitis: Secondary | ICD-10-CM | POA: Insufficient documentation

## 2013-03-22 DIAGNOSIS — M25569 Pain in unspecified knee: Secondary | ICD-10-CM

## 2013-03-22 LAB — POC HEMOCCULT BLD/STL (OFFICE/1-CARD/DIAGNOSTIC): Fecal Occult Blood, POC: NEGATIVE

## 2013-03-22 MED ORDER — PREDNISONE 5 MG PO TABS: 5.0000 mg | ORAL_TABLET | Freq: Two times a day (BID) | ORAL | Status: AC

## 2013-03-22 MED ORDER — VENLAFAXINE HCL ER 37.5 MG PO CP24: 37.5000 mg | ORAL_CAPSULE | Freq: Every day | ORAL | Status: DC

## 2013-03-22 MED ORDER — METHYLPREDNISOLONE ACETATE 80 MG/ML IJ SUSP
80.0000 mg | Freq: Once | INTRAMUSCULAR | Status: AC
  Administered 2013-03-22: 80 mg via INTRAMUSCULAR

## 2013-03-22 MED ORDER — METOPROLOL TARTRATE 25 MG PO TABS: 25.0000 mg | ORAL_TABLET | Freq: Two times a day (BID) | ORAL | Status: DC

## 2013-03-22 MED ORDER — KETOROLAC TROMETHAMINE 60 MG/2ML IM SOLN
60.0000 mg | Freq: Once | INTRAMUSCULAR | Status: AC
  Administered 2013-03-22: 60 mg via INTRAMUSCULAR

## 2013-03-22 MED ORDER — TEMAZEPAM 30 MG PO CAPS: 30.0000 mg | ORAL_CAPSULE | Freq: Every evening | ORAL | Status: DC | PRN

## 2013-03-22 MED ORDER — PHENTERMINE HCL 37.5 MG PO TABS: ORAL_TABLET | ORAL | Status: DC

## 2013-03-22 MED ORDER — ESOMEPRAZOLE MAGNESIUM 40 MG PO CPDR: 40.0000 mg | DELAYED_RELEASE_CAPSULE | Freq: Every day | ORAL | Status: DC

## 2013-03-22 NOTE — Progress Notes (Signed)
  Subjective:    Patient ID: Julie Ryan, female    DOB: 1965/12/03, 47 y.o.   MRN: [id number]  HPI Pt in for annual exam including pap. Has concerns of malodorous vaginal d/c with some dyspareunia in past several weeks. Denies urinary symptoms. States feels depressed and overwhelmed often esp as she has to be staying with family, no place of her own for financial reasons. Not suicidal or homicidal, experiencing increased anxiety. Good response to phentermine , no adverse s/e wants to continuie and willing to commit to and work consistently on weight losss   Review of Systems See HPI Denies recent fever or chills. Denies sinus pressure, nasal congestion, ear pain or sore throat. Denies chest congestion, productive cough or wheezing. Denies chest pains, palpitations and leg swelling Denies vomiting,diarrhea or constipation.  C/o epigastric discomfort and uncontrolled reflux Denies dysuria, frequency, hesitancy or incontinence. Increased knee pain with limitation in mobility Denies headaches, seizures, numbness, or tingling.  Denies skin break down or rash.        Objective:   Physical Exam  Pleasant obese female, alert and oriented x 3, in no cardio-pulmonary distress. Afebrile. HEENT No facial trauma or asymetry. Sinuses non tender.  EOMI, PERTL, fundoscopic exam  no hemorhage or exudate.  External ears normal, tympanic membranes clear. Oropharynx moist, no exudate, fair dentition. Neck: supple, no adenopathy,JVD or thyromegaly.No bruits.  Chest: Clear to ascultation bilaterally.No crackles or wheezes. Non tender to palpation  Breast: No asymetry,no masses. No nipple discharge or inversion. No axillary or supraclavicular adenopathy  Cardiovascular system; Heart sounds normal,  S1 and  S2 ,no S3.  No murmur, or thrill. Apical beat not displaced Peripheral pulses normal.  Abdomen: Soft,mild epigastric tenderness, no organomegaly or masses. No bruits. Bowel sounds  normal. No guarding, tenderness or rebound.  Rectal:  No mass. Guaiac negative stool.  GU: External genitalia normal. No lesions. Vaginal canal normal.malodorous  discharge. Uterus normal size, no adnexal masses, no cervical motion or adnexal tenderness.  Musculoskeletal exam: Full ROM of spine, hips , shoulders and reduced in  knee. No deformity ,swelling or crepitus noted. No muscle wasting or atrophy.   Neurologic: Cranial nerves 2 to 12 intact. Power, tone ,sensation and reflexes normal throughout. No disturbance in gait. No tremor.  Skin: Intact, no ulceration, erythema , scaling or rash noted. Pigmentation normal throughout  Psych; Depressed mood with flat affect. Judgement and concentration normal       Assessment & Plan:

## 2013-03-22 NOTE — Patient Instructions (Addendum)
F/u in 3 month, call if you need me before  Flu vaccine today  Topradool 60mg  im and depo medrol 80 mg Im in office today for left knee pain, and prednisone prescribed for 5 days only  New for depression is effexor 1 daily  Increase phentermine to one tablet once daily  New for reflux is nexium 40mg  one daily, you are also referred to Dr Karilyn Cota for evaluation of difficulty swallowing solids.  You NEED to change to decaffeinated bevrage to improve the reflux  Dose increase in restoril to 30mg  one at bedtime for sleep  Please commit to daily exercise for at least 30 minutes total  Weight loss goal of 10 to 12 pounds in the next 3 month  Start vit D3 1000IU once daily, your level is low, this is OTC

## 2013-03-22 NOTE — Assessment & Plan Note (Addendum)
torado  60 mg and depo medrol 80 mg, uncontrolled

## 2013-03-23 DIAGNOSIS — A599 Trichomoniasis, unspecified: Secondary | ICD-10-CM | POA: Insufficient documentation

## 2013-03-25 DIAGNOSIS — Z0000 Encounter for general adult medical examination without abnormal findings: Secondary | ICD-10-CM | POA: Insufficient documentation

## 2013-03-25 NOTE — Assessment & Plan Note (Signed)
Uncontrolled, reduce caffeine and inc dose of PPI

## 2013-03-25 NOTE — Assessment & Plan Note (Signed)
Uncontrolled , dose inc in resoril

## 2013-03-25 NOTE — Assessment & Plan Note (Signed)
Increased and uncontrolled , not suicidal or homicidal, start effexor

## 2013-03-25 NOTE — Assessment & Plan Note (Signed)
Uncontrolled start effexor

## 2013-03-25 NOTE — Assessment & Plan Note (Signed)
Improved. Pt applauded on succesful weight loss through lifestyle change, and encouraged to continue same. Weight loss goal set for the next several months.  

## 2013-03-25 NOTE — Assessment & Plan Note (Signed)
Annual exam as documented, specimens sent for s/c from vag canal also pap Lifestyle change to improve health discussed and encouraged, and goals set

## 2013-03-25 NOTE — Assessment & Plan Note (Signed)
Controlled, no change in medication DASH diet and commitment to daily physical activity for a minimum of 30 minutes discussed and encouraged, as a part of hypertension management. The importance of attaining a healthy weight is also discussed.  

## 2013-03-25 NOTE — Assessment & Plan Note (Signed)
Level is 23, pt to use OTC daily Vit D supplement

## 2013-03-27 ENCOUNTER — Other Ambulatory Visit: Payer: Self-pay

## 2013-03-27 DIAGNOSIS — A599 Trichomoniasis, unspecified: Secondary | ICD-10-CM

## 2013-03-27 MED ORDER — METRONIDAZOLE 500 MG PO TABS: 500.0000 mg | ORAL_TABLET | Freq: Two times a day (BID) | ORAL | Status: DC

## 2013-05-10 ENCOUNTER — Encounter (INDEPENDENT_AMBULATORY_CARE_PROVIDER_SITE_OTHER): Payer: Self-pay | Admitting: *Deleted

## 2013-05-24 ENCOUNTER — Encounter (INDEPENDENT_AMBULATORY_CARE_PROVIDER_SITE_OTHER): Payer: Self-pay | Admitting: *Deleted

## 2013-05-24 ENCOUNTER — Other Ambulatory Visit (INDEPENDENT_AMBULATORY_CARE_PROVIDER_SITE_OTHER): Payer: Self-pay | Admitting: *Deleted

## 2013-05-24 ENCOUNTER — Encounter (INDEPENDENT_AMBULATORY_CARE_PROVIDER_SITE_OTHER): Payer: Self-pay | Admitting: Internal Medicine

## 2013-05-24 ENCOUNTER — Ambulatory Visit (INDEPENDENT_AMBULATORY_CARE_PROVIDER_SITE_OTHER): Payer: BC Managed Care – PPO | Admitting: Internal Medicine

## 2013-05-24 VITALS — BP 94/60 | HR 68 | Temp 98.4°F | Ht 66.0 in | Wt 246.8 lb

## 2013-05-24 DIAGNOSIS — K219 Gastro-esophageal reflux disease without esophagitis: Secondary | ICD-10-CM

## 2013-05-24 NOTE — Progress Notes (Signed)
Subjective:     Patient ID: Julie Ryan, female   DOB: Oct 24, 1965, 47 y.o.   MRN: [id number]  HPI Referred to our office for GERD.  She tells me she has chest pain especially at night. The pain last sometimes all night. She describes as a burning sensation. She has had symptoms since she was a teenager. She has taken PPIs for many years. Sometimes she will have this burning after she eats. If she burps, she can taste the acid. She takes Zantac OTC 30 minutes before breakfast.  She could not afford the Nexium. The Nexium controlled her symptoms. Appetite is good. No unitentional weight loss. No abdominal pain. She has a BM about every other day. No melena or bright red rectal bleeding. She takes Ibuprofen 200mg  every night for her knee.  No dysphagia.   Review of Systems  See hpi Current Outpatient Prescriptions  Medication Sig Dispense Refill  . furosemide (LASIX) 40 MG tablet Take 40 mg by mouth daily.      Marland Kitchen ibuprofen (ADVIL,MOTRIN) 800 MG tablet Take 800 mg by mouth every 6 (six) hours as needed.       . lovastatin (MEVACOR) 20 MG tablet Take 40 mg by mouth at bedtime.      . metoprolol tartrate (LOPRESSOR) 25 MG tablet Take 1 tablet (25 mg total) by mouth 2 (two) times daily.  60 tablet  4  . phentermine (ADIPEX-P) 37.5 MG tablet One tablet once daily before breakfast  30 tablet  3  . ranitidine (ZANTAC) 150 MG tablet Take 150 mg by mouth daily.      . temazepam (RESTORIL) 30 MG capsule Take 1 capsule (30 mg total) by mouth at bedtime as needed for sleep.  30 capsule  3   No current facility-administered medications for this visit.   Past Medical History  Diagnosis Date  . Anxiety   . Depression   . Obesity   . Hypertension   . Prediabetes    Past Surgical History  Procedure Laterality Date  . Tubal ligation    . Appendectomy            Objective:   Physical Exam Filed Vitals:   05/24/13 1057  BP: 94/60  Pulse: 68  Temp: 98.4 F (36.9 C)  Height: 5\' 6"   (1.676 m)  Weight: 246 lb 12.8 oz (111.948 kg)  Alert and oriented. Skin warm and dry. Oral mucosa is moist.   . Sclera anicteric, conjunctivae is pink. Thyroid not enlarged. No cervical lymphadenopathy. Lungs clear. Heart regular rate and rhythm.  Abdomen is soft. Bowel sounds are positive. No hepatomegaly. No abdominal masses felt. No tenderness.  No edema to lower extremities.       Assessment:    GERD. No controlled at this time. She has taken Nexium in the past which has helped her symptoms.   Plan:    EGD with Dr Karilyn Cota.. Get Prilosec OTC. Coupon given. GERD diet given to patient.

## 2013-05-24 NOTE — Patient Instructions (Addendum)
EGD.The risks and benefits such as perforation, bleeding, and infection were reviewed with the patient and is agreeable. Try the Prilosec OTC. Coupon given. GERD diet given to patient

## 2013-05-25 ENCOUNTER — Ambulatory Visit (HOSPITAL_COMMUNITY)
Admission: RE | Admit: 2013-05-25 | Discharge: 2013-05-25 | Disposition: A | Discharge status: Home / Self Care | Payer: BC Managed Care – PPO | Source: Ambulatory Visit | Attending: Internal Medicine | Admitting: Internal Medicine

## 2013-05-25 ENCOUNTER — Encounter (HOSPITAL_COMMUNITY): Payer: Self-pay | Admitting: *Deleted

## 2013-05-25 ENCOUNTER — Encounter (HOSPITAL_COMMUNITY)
Admission: RE | Disposition: A | Discharge status: Home / Self Care | Payer: Self-pay | Source: Ambulatory Visit | Attending: Internal Medicine

## 2013-05-25 DIAGNOSIS — K2100 Gastro-esophageal reflux disease with esophagitis, without bleeding: Secondary | ICD-10-CM | POA: Insufficient documentation

## 2013-05-25 DIAGNOSIS — K449 Diaphragmatic hernia without obstruction or gangrene: Secondary | ICD-10-CM

## 2013-05-25 DIAGNOSIS — R1310 Dysphagia, unspecified: Secondary | ICD-10-CM

## 2013-05-25 DIAGNOSIS — K2960 Other gastritis without bleeding: Secondary | ICD-10-CM | POA: Insufficient documentation

## 2013-05-25 DIAGNOSIS — K219 Gastro-esophageal reflux disease without esophagitis: Secondary | ICD-10-CM

## 2013-05-25 DIAGNOSIS — I10 Essential (primary) hypertension: Secondary | ICD-10-CM | POA: Insufficient documentation

## 2013-05-25 HISTORY — PX: ESOPHAGOGASTRODUODENOSCOPY: SHX5428

## 2013-05-25 SURGERY — EGD (ESOPHAGOGASTRODUODENOSCOPY)
Anesthesia: Moderate Sedation

## 2013-05-25 MED ORDER — STERILE WATER FOR IRRIGATION IR SOLN
Status: DC | PRN
  Administered 2013-05-25: 14:00:00

## 2013-05-25 MED ORDER — PANTOPRAZOLE SODIUM 40 MG PO TBEC: 40.0000 mg | DELAYED_RELEASE_TABLET | Freq: Two times a day (BID) | ORAL | Status: DC

## 2013-05-25 MED ORDER — MEPERIDINE HCL 50 MG/ML IJ SOLN
INTRAMUSCULAR | Status: DC | PRN
  Administered 2013-05-25 (×2): 25 mg via INTRAVENOUS

## 2013-05-25 MED ORDER — BUTAMBEN-TETRACAINE-BENZOCAINE 2-2-14 % EX AERO
INHALATION_SPRAY | CUTANEOUS | Status: DC | PRN
  Administered 2013-05-25: 2 via TOPICAL

## 2013-05-25 MED ORDER — MIDAZOLAM HCL 5 MG/5ML IJ SOLN
INTRAMUSCULAR | Status: AC
  Filled 2013-05-25: qty 10

## 2013-05-25 MED ORDER — MEPERIDINE HCL 50 MG/ML IJ SOLN
INTRAMUSCULAR | Status: AC
  Filled 2013-05-25: qty 1

## 2013-05-25 MED ORDER — MIDAZOLAM HCL 5 MG/5ML IJ SOLN
INTRAMUSCULAR | Status: DC | PRN
  Administered 2013-05-25: 2 mg via INTRAVENOUS
  Administered 2013-05-25: 1 mg via INTRAVENOUS
  Administered 2013-05-25: 3 mg via INTRAVENOUS
  Administered 2013-05-25: 2 mg via INTRAVENOUS
  Administered 2013-05-25: 1 mg via INTRAVENOUS

## 2013-05-25 MED ORDER — SODIUM CHLORIDE 0.9 % IV SOLN
INTRAVENOUS | Status: DC
  Administered 2013-05-25: 14:00:00 via INTRAVENOUS

## 2013-05-25 NOTE — Op Note (Signed)
EGD PROCEDURE REPORT  PATIENT:  Julie Ryan  MR#:  [id number] Birthdate:  Jan 30, 1966, 47 y.o., female Endoscopist:  Dr. Malissa Hippo, MD Referred By:  Dr. Syliva Overman, MD  Procedure Date: 05/25/2013  Procedure:   EGD with ED.  Indications:  Patient is 47 year old African female with over 20 year history of heartburn who presents with intermittent solid food dysphagia. She is presently on ranitidine which is not working.            Informed Consent:  The risks, benefits, alternatives & imponderables which include, but are not limited to, bleeding, infection, perforation, drug reaction and potential missed lesion have been reviewed.  The potential for biopsy, lesion removal, esophageal dilation, etc. have also been discussed.  Questions have been answered.  All parties agreeable.  Please see history & physical in medical record for more information.  Medications:  Demerol 50 mg IV Versed 9 mg IV Cetacaine spray topically for oropharyngeal anesthesia  Description of procedure:  The endoscope was introduced through the mouth and advanced to the second portion of the duodenum without difficulty or limitations. The mucosal surfaces were surveyed very carefully during advancement of the scope and upon withdrawal.  Findings:  Esophagus:  Mucosa of the proximal and middle third was normal. She had 10 mm long erosion in distal esophagus extending to GE junction along with other smaller erosion at GE junction. Incomplete ring noted at GE junction. GEJ:  34 cm Hiatus:  36 cm Stomach:  Stomach was empty and distended very well with insufflation. Folds in the proximal stomach were normal. Examination of mucosa at gastric body was normal. Few antral/prepyloric erosions were noted. Pyloric channel was patent. Angularis fundus and cardia were examined by retroflexing  the scope and were normal. Duodenum:  Normal bulbar and post bulbar mucosa.   Therapeutic/Diagnostic Maneuvers Performed:    Esophagus dilated by passing 54 French Maloney dilator for the insertion. Esophageal mucosa was reexamined postovulation and small mucosal disruption noted at cervical esophagus indicative of a web.  Complications:  None  Impression: Erosive reflux esophagitis with incomplete ring at GE junction. Small sliding hiatal hernia. Erosive antral gastritis. Esophagus dilated by passing 54 French Maloney dilator resulting small mucosal disruption or cervical esophagus indicators of a web.  Recommendations:  Discontinue ranitidine. Pantoprazole 40 mg by mouth twice a day. H. pylori serology will be checked today. Office visit in 4 weeks.  Diahn Waidelich U  05/25/2013  2:42 PM  CC: Dr. Syliva Overman, MD & Dr. Bonnetta Barry ref. provider found

## 2013-05-25 NOTE — H&P (Signed)
Julie Ryan is an 47 y.o. female.   Chief Complaint: Patient is here for EGD and ED. HPI: Patient is 47 year old African female who has history of GERD for more than 20 years who now presents with intermittent solid food dysphagia which started about a year ago. She points to lower sternum area as the site of bolus obstruction. Food bolus always passes after a few minutes. She denies nausea vomiting abdominal pain or melena. Is presently on ranitidine which is not working. Prilosec did not help either. She has had excellent symptom control with Nexium but her insurance would not cover.  Past Medical History  Diagnosis Date  . Anxiety   . Depression   . Obesity   . Hypertension   . Prediabetes     Past Surgical History  Procedure Laterality Date  . Tubal ligation    . Appendectomy      Family History  Problem Relation Age of Onset  . Hypertension Mother   . Heart disease Mother   . Diabetes Mother   . Diabetes Sister   . Diabetes Brother   . Hypertension Brother    Social History:  reports that she has quit smoking. She does not have any smokeless tobacco history on file. She reports that she does not drink alcohol or use illicit drugs.  Allergies:  Allergies  Allergen Reactions  . Codeine Nausea Only and Other (See Comments)    Dizziness   . Hydrocodone Nausea Only and Other (See Comments)    Dizziness   . Latex Itching and Rash    Medications Prior to Admission  Medication Sig Dispense Refill  . furosemide (LASIX) 40 MG tablet Take 40 mg by mouth daily.      Marland Kitchen ibuprofen (ADVIL,MOTRIN) 800 MG tablet Take 800 mg by mouth every 6 (six) hours as needed.       . lovastatin (MEVACOR) 20 MG tablet Take 40 mg by mouth at bedtime.      . metoprolol tartrate (LOPRESSOR) 25 MG tablet Take 1 tablet (25 mg total) by mouth 2 (two) times daily.  60 tablet  4  . phentermine (ADIPEX-P) 37.5 MG tablet One tablet once daily before breakfast  30 tablet  3  . ranitidine (ZANTAC) 150 MG  tablet Take 150 mg by mouth daily.      . temazepam (RESTORIL) 30 MG capsule Take 1 capsule (30 mg total) by mouth at bedtime as needed for sleep.  30 capsule  3    No results found for this or any previous visit (from the past 48 hour(s)). No results found.  ROS  Blood pressure 123/62, pulse 71, temperature 98 F (36.7 C), temperature source Oral, resp. rate 17, height 5\' 6"  (1.676 m), weight 246 lb (111.585 kg), last menstrual period 04/20/2013, SpO2 99.00%. Physical Exam  Constitutional: She appears well-developed and well-nourished.  HENT:  Mouth/Throat: Oropharynx is clear and moist.  Eyes: Conjunctivae are normal. No scleral icterus.  Neck: No thyromegaly present.  Cardiovascular:  Regular rhythm normal S1 and split S2. No murmur noted.  Respiratory: Effort normal and breath sounds normal.  GI: Soft.  Musculoskeletal: She exhibits no edema.  Lymphadenopathy:    She has no cervical adenopathy.  Neurological: She is alert.  Skin: Skin is warm and dry.     Assessment/Plan Dysphagia in a patient with chronic GERD. EGD an ED.  Julie Ryan U 05/25/2013, 2:14 PM

## 2013-05-28 ENCOUNTER — Telehealth (INDEPENDENT_AMBULATORY_CARE_PROVIDER_SITE_OTHER): Payer: Self-pay | Admitting: *Deleted

## 2013-05-28 DIAGNOSIS — K228 Other specified diseases of esophagus: Secondary | ICD-10-CM

## 2013-05-28 LAB — H. PYLORI ANTIBODY, IGG: H Pylori IgG: 0.41 {ISR}

## 2013-05-28 MED ORDER — SUCRALFATE 1 GM/10ML PO SUSP: 1.0000 g | Freq: Three times a day (TID) | ORAL | Status: DC

## 2013-05-28 MED ORDER — HYDROCODONE-ACETAMINOPHEN 5-300 MG PO TABS: 1.0000 | ORAL_TABLET | Freq: Four times a day (QID) | ORAL | Status: DC | PRN

## 2013-05-28 NOTE — Telephone Encounter (Signed)
Dr.Rehman is calling the patient and in medication for the patient.

## 2013-05-28 NOTE — Telephone Encounter (Signed)
Patient called stating she was still having soreness or discomfort on swallowing. She had soft he does dilated last week with mucosal destruction and cervical esophagus indicative of web. Carafate 1 g by mouth a.c. and each bedtime for 2 weeks. Vicodin 5 301 tablet by mouth every 6 when necessary; 20 doses.

## 2013-05-28 NOTE — OR Nursing (Signed)
Called patient for follow-up call and patient stated that she was still having pain when she tried to eat. Patient stated that she had called the office and spoke with Lupita Leash and they were going to call her in some medicine. Patient to call me back on Wednesday if her symptoms are no better.

## 2013-05-28 NOTE — Telephone Encounter (Signed)
Julie Ryan had a procedure on 05/25/13 and is still not able to swallow solid foods. Per Dr. Karilyn Cota he will have Tammy call her in some medicine and she should feel better by Friday. Patient advised and voices understood. Ailah uses [facility].

## 2013-05-29 ENCOUNTER — Telehealth: Payer: Self-pay | Admitting: Family Medicine

## 2013-05-30 NOTE — Telephone Encounter (Signed)
Patient aware that she can come in for a nurse visit for ppd placement.  She is also aware that this test is not done on Thursdays.

## 2013-06-04 ENCOUNTER — Encounter (HOSPITAL_COMMUNITY): Payer: Self-pay | Admitting: Internal Medicine

## 2013-06-25 ENCOUNTER — Ambulatory Visit (INDEPENDENT_AMBULATORY_CARE_PROVIDER_SITE_OTHER): Payer: BC Managed Care – PPO | Admitting: Internal Medicine

## 2013-06-25 ENCOUNTER — Encounter (INDEPENDENT_AMBULATORY_CARE_PROVIDER_SITE_OTHER): Payer: Self-pay | Admitting: Internal Medicine

## 2013-06-25 VITALS — BP 126/50 | HR 72 | Temp 99.2°F | Ht 66.0 in | Wt 241.6 lb

## 2013-06-25 DIAGNOSIS — K2210 Ulcer of esophagus without bleeding: Secondary | ICD-10-CM | POA: Insufficient documentation

## 2013-06-25 DIAGNOSIS — K219 Gastro-esophageal reflux disease without esophagitis: Secondary | ICD-10-CM

## 2013-06-25 DIAGNOSIS — K208 Other esophagitis without bleeding: Secondary | ICD-10-CM

## 2013-06-25 NOTE — Patient Instructions (Signed)
Protonix BID, Keep HOB elevated

## 2013-06-25 NOTE — Progress Notes (Signed)
Subjective:     Patient ID: Julie Ryan, female   DOB: 1965-09-23, 47 y.o.   MRN: [id number]  HPI Here today for f/u after underoing and EGD/ED 4 weeks ago for dysphagia and epigastric tenderness. EGD revealed see below. Placed on Carafate and Protonix BID. She has finished the Carafate. She tells me she feels much better.  No acid reflux at night.  Appetite is good. No unintentional weight loss. She has lost 6 pounds but was intentional Her bowel move normal. Usually one-two a day.   05/25/2013 EGD/ED: Impression:  Erosive reflux esophagitis with incomplete ring at GE junction.  Small sliding hiatal hernia.  Erosive antral gastritis.  Esophagus dilated by passing 54 French Maloney dilator resulting small mucosal disruption or cervical esophagus indicators of a web. H. Pylori was negative   Review of Systems see hpi Current Outpatient Prescriptions  Medication Sig Dispense Refill  . furosemide (LASIX) 40 MG tablet Take 40 mg by mouth daily.      . Hydrocodone-Acetaminophen 5-300 MG TABS Take 1 tablet by mouth 4 (four) times daily as needed.  20 each  0  . ibuprofen (ADVIL,MOTRIN) 800 MG tablet Take 800 mg by mouth every 6 (six) hours as needed.       . lovastatin (MEVACOR) 20 MG tablet Take 40 mg by mouth at bedtime.      . metoprolol tartrate (LOPRESSOR) 25 MG tablet Take 1 tablet (25 mg total) by mouth 2 (two) times daily.  60 tablet  4  . pantoprazole (PROTONIX) 40 MG tablet Take 1 tablet (40 mg total) by mouth 2 (two) times daily.  60 tablet  3  . phentermine (ADIPEX-P) 37.5 MG tablet One tablet once daily before breakfast  30 tablet  3  . sucralfate (CARAFATE) 1 GM/10ML suspension Take 10 mLs (1 g total) by mouth 4 (four) times daily -  with meals and at bedtime.  420 mL  0  . temazepam (RESTORIL) 30 MG capsule Take 1 capsule (30 mg total) by mouth at bedtime as needed for sleep.  30 capsule  3   No current facility-administered medications for this visit.   Past Medical  History  Diagnosis Date  . Anxiety   . Depression   . Obesity   . Hypertension   . Prediabetes    Past Surgical History  Procedure Laterality Date  . Tubal ligation    . Appendectomy    . Esophagogastroduodenoscopy N/A 05/25/2013    Procedure: ESOPHAGOGASTRODUODENOSCOPY (EGD);  Surgeon: Malissa Hippo, MD;  Location: [facility];  Service: Endoscopy;  Laterality: N/A;  220        Objective:   Physical Exam  Filed Vitals:   06/25/13 1531  BP: 126/50  Pulse: 72  Temp: 99.2 F (37.3 C)  Height: 5\' 6"  (1.676 m)  Weight: 241 lb 9.6 oz (109.589 kg)  Alert and oriented. Skin warm and dry. Oral mucosa is moist.   . Sclera anicteric, conjunctivae is pink. Thyroid not enlarged. No cervical lymphadenopathy. Lungs clear. Heart regular rate and rhythm.  Abdomen is soft. Bowel sounds are positive. No hepatomegaly. No abdominal masses felt. No tenderness.  No edema to lower extremities. Patient is alert and oriented.      Assessment:    Erosive esophagitis. She is much better now. No dysphagia.    Plan:    Continue present medications. Protonix BID. Keep HOB elevated. OV in 1 year.

## 2013-07-03 ENCOUNTER — Ambulatory Visit (INDEPENDENT_AMBULATORY_CARE_PROVIDER_SITE_OTHER): Payer: BC Managed Care – PPO | Admitting: Family Medicine

## 2013-07-03 ENCOUNTER — Encounter: Payer: Self-pay | Admitting: Family Medicine

## 2013-07-03 VITALS — BP 120/72 | HR 88 | Resp 16 | Ht 66.0 in | Wt 243.1 lb

## 2013-07-03 DIAGNOSIS — E785 Hyperlipidemia, unspecified: Secondary | ICD-10-CM

## 2013-07-03 DIAGNOSIS — E8881 Metabolic syndrome: Secondary | ICD-10-CM

## 2013-07-03 DIAGNOSIS — I10 Essential (primary) hypertension: Secondary | ICD-10-CM

## 2013-07-03 DIAGNOSIS — M25562 Pain in left knee: Secondary | ICD-10-CM

## 2013-07-03 DIAGNOSIS — R7309 Other abnormal glucose: Secondary | ICD-10-CM

## 2013-07-03 DIAGNOSIS — G4700 Insomnia, unspecified: Secondary | ICD-10-CM

## 2013-07-03 DIAGNOSIS — K219 Gastro-esophageal reflux disease without esophagitis: Secondary | ICD-10-CM

## 2013-07-03 DIAGNOSIS — M25569 Pain in unspecified knee: Secondary | ICD-10-CM

## 2013-07-03 DIAGNOSIS — J309 Allergic rhinitis, unspecified: Secondary | ICD-10-CM

## 2013-07-03 DIAGNOSIS — Z111 Encounter for screening for respiratory tuberculosis: Secondary | ICD-10-CM

## 2013-07-03 MED ORDER — IBUPROFEN 800 MG PO TABS: 800.0000 mg | ORAL_TABLET | Freq: Four times a day (QID) | ORAL | Status: DC | PRN

## 2013-07-03 MED ORDER — FUROSEMIDE 40 MG PO TABS: 40.0000 mg | ORAL_TABLET | Freq: Every day | ORAL | Status: DC

## 2013-07-03 MED ORDER — FLUTICASONE PROPIONATE 50 MCG/ACT NA SUSP: 1.0000 | Freq: Every day | NASAL | Status: DC

## 2013-07-03 MED ORDER — HYDROCODONE-ACETAMINOPHEN 7.5-325 MG PO TABS: ORAL_TABLET | ORAL | Status: AC

## 2013-07-03 MED ORDER — PHENTERMINE HCL 37.5 MG PO TBDP: ORAL_TABLET | ORAL | Status: DC

## 2013-07-03 MED ORDER — LOVASTATIN 20 MG PO TABS: 40.0000 mg | ORAL_TABLET | Freq: Every day | ORAL | Status: DC

## 2013-07-03 NOTE — Patient Instructions (Addendum)
F/u in 4 month, call if you need me before  Fasting lipid, cmp, HBA1C this week please  Continue daily phentermine, weight loss goal of 3 pounds per month   You are referred to orthopedic Doc for left knee pain, also limited to one month only hydrocone at bedtime for pain

## 2013-07-03 NOTE — Progress Notes (Signed)
   Subjective:    Patient ID: Julie Ryan, female    DOB: 1965-10-03, 47 y.o.   MRN: [id number]  HPI The PT is here for follow up and re-evaluation of chronic medical conditions, medication management and review of any available recent lab and radiology data.  Preventive health is updated, specifically  Cancer screening and Immunization.    The PT denies any adverse reactions to current medications since the last visit.  There are no new concerns.  Increasing left knee pain and instability, disturbing sleep and preventing weight bearing exercise, deteriorated in past 2 month Needs PPD placed       Review of Systems See HPI Denies recent fever or chills. Denies sinus pressure, nasal congestion, ear pain or sore throat. Denies chest congestion, productive cough or wheezing. Denies chest pains, palpitations and leg swelling Denies abdominal pain, nausea, vomiting,diarrhea or constipation.   Denies dysuria, frequency, hesitancy or incontinence.  Denies headaches, seizures, numbness, or tingling. Denies depression or  anxiety  Denies skin break down or rash.        Objective:   Physical Exam  Patient alert and oriented and in no cardiopulmonary distress.  HEENT: No facial asymmetry, EOMI, no sinus tenderness,  oropharynx pink and moist.  Neck supple no adenopathy.  Chest: Clear to auscultation bilaterally.  CVS: S1, S2 no murmurs, no S3.  ABD: Soft non tender. Bowel sounds normal.  Ext: No edema  MS: Adequate ROM spine, shoulders, hips and reduced in left knee which is swollen and tender  Skin: Intact, no ulcerations or rash noted.  Psych: Good eye contact, normal affect. Memory intact not anxious or depressed appearing.  CNS: CN 2-12 intact, power, tone and sensation normal throughout.       Assessment & Plan:

## 2013-07-04 LAB — LIPID PANEL
Cholesterol: 168 mg/dL (ref 0–200)
HDL: 47 mg/dL (ref >39)
Total CHOL/HDL Ratio: 3.6 Ratio
VLDL: 16 mg/dL (ref 0–40)

## 2013-07-04 LAB — COMPREHENSIVE METABOLIC PANEL
ALT: 8 U/L (ref 0–35)
Alkaline Phosphatase: 98 U/L (ref 39–117)
CO2: 28 mEq/L (ref 19–32)
Creat: 0.9 mg/dL (ref 0.50–1.10)
Glucose, Bld: 94 mg/dL (ref 70–99)
Potassium: 4.4 mEq/L (ref 3.5–5.3)
Total Bilirubin: 0.3 mg/dL (ref 0.3–1.2)

## 2013-07-04 LAB — HEMOGLOBIN A1C: Mean Plasma Glucose: 131 mg/dL — ABNORMAL HIGH (ref <117)

## 2013-07-06 LAB — TB SKIN TEST
Induration: 0 mm
TB Skin Test: NEGATIVE

## 2013-07-08 DIAGNOSIS — E8881 Metabolic syndrome: Secondary | ICD-10-CM | POA: Insufficient documentation

## 2013-07-08 NOTE — Assessment & Plan Note (Signed)
Deteriorated since last visit , limits movement , instability present, 1 month of hydrocodone for bedtime use as needed, and refer to orhto for definitive management

## 2013-07-08 NOTE — Assessment & Plan Note (Signed)
Controlled, no change in medication DASH diet and commitment to daily physical activity for a minimum of 30 minutes discussed and encouraged, as a part of hypertension management. The importance of attaining a healthy weight is also discussed.  

## 2013-07-08 NOTE — Assessment & Plan Note (Signed)
Improved on medication, knee pain a limiting factor currently. No change in restoril dose

## 2013-07-08 NOTE — Assessment & Plan Note (Signed)
Pt at increased CV risk and definitely needs to work on lifestyle change with weight loss and lose weight to address this

## 2013-07-08 NOTE — Assessment & Plan Note (Signed)
Unchanged. Inability to exercise has been a recent limiting factor, hopefully this will soon change. Patient re-educated about  the importance of commitment to a  minimum of 150 minutes of exercise per week. The importance of healthy food choices with portion control discussed. Encouraged to start a food diary, count calories and to consider  joining a support group. Sample diet sheets offered. Goals set by the patient for the next several months.

## 2013-07-08 NOTE — Assessment & Plan Note (Signed)
Controlled, no change in medication  

## 2013-07-08 NOTE — Assessment & Plan Note (Signed)
Unchanged, LDL elevated.No med change at this time Hyperlipidemia:Low fat diet discussed and encouraged.

## 2013-07-24 ENCOUNTER — Ambulatory Visit (INDEPENDENT_AMBULATORY_CARE_PROVIDER_SITE_OTHER): Payer: BC Managed Care – PPO

## 2013-07-24 DIAGNOSIS — Z111 Encounter for screening for respiratory tuberculosis: Secondary | ICD-10-CM

## 2013-07-25 NOTE — Progress Notes (Signed)
2 step PPD placed and patient coming back to be read 07/26/2013

## 2013-07-26 ENCOUNTER — Ambulatory Visit: Payer: BC Managed Care – PPO

## 2013-07-26 LAB — TB SKIN TEST
Induration: 0 mm
TB Skin Test: NEGATIVE

## 2013-08-01 ENCOUNTER — Telehealth: Payer: Self-pay | Admitting: Family Medicine

## 2013-08-01 NOTE — Telephone Encounter (Signed)
Message left that copy of TB test was up front to be collected

## 2013-08-25 ENCOUNTER — Other Ambulatory Visit: Payer: Self-pay | Admitting: Family Medicine

## 2013-10-20 ENCOUNTER — Other Ambulatory Visit: Payer: Self-pay | Admitting: Family Medicine

## 2013-11-01 ENCOUNTER — Ambulatory Visit: Payer: BC Managed Care – PPO | Admitting: Family Medicine

## 2013-11-13 ENCOUNTER — Telehealth: Payer: Self-pay | Admitting: Family Medicine

## 2013-11-14 ENCOUNTER — Other Ambulatory Visit: Payer: Self-pay

## 2013-11-14 DIAGNOSIS — J309 Allergic rhinitis, unspecified: Secondary | ICD-10-CM

## 2013-11-14 MED ORDER — FLUTICASONE PROPIONATE 50 MCG/ACT NA SUSP: 1.0000 | Freq: Every day | NASAL | Status: DC

## 2013-11-14 MED ORDER — TEMAZEPAM 30 MG PO CAPS: ORAL_CAPSULE | ORAL | Status: DC

## 2013-11-14 MED ORDER — PANTOPRAZOLE SODIUM 40 MG PO TBEC: 40.0000 mg | DELAYED_RELEASE_TABLET | Freq: Two times a day (BID) | ORAL | Status: DC

## 2013-11-14 MED ORDER — FUROSEMIDE 40 MG PO TABS: 40.0000 mg | ORAL_TABLET | Freq: Every day | ORAL | Status: DC

## 2013-11-14 MED ORDER — METOPROLOL TARTRATE 25 MG PO TABS: ORAL_TABLET | ORAL | Status: DC

## 2013-11-14 MED ORDER — LOVASTATIN 20 MG PO TABS: 40.0000 mg | ORAL_TABLET | Freq: Every day | ORAL | Status: DC

## 2013-11-14 MED ORDER — IBUPROFEN 800 MG PO TABS: 800.0000 mg | ORAL_TABLET | Freq: Four times a day (QID) | ORAL | Status: DC | PRN

## 2013-11-14 NOTE — Telephone Encounter (Signed)
Med refilled.

## 2014-01-08 ENCOUNTER — Ambulatory Visit: Payer: BC Managed Care – PPO | Admitting: Family Medicine

## 2014-03-25 ENCOUNTER — Other Ambulatory Visit: Payer: Self-pay | Admitting: Family Medicine

## 2014-04-10 ENCOUNTER — Encounter (INDEPENDENT_AMBULATORY_CARE_PROVIDER_SITE_OTHER): Payer: Self-pay | Admitting: *Deleted

## 2014-04-15 ENCOUNTER — Telehealth: Payer: Self-pay

## 2014-04-15 NOTE — Telephone Encounter (Signed)
Patient aware that requested information is up front for collection.

## 2014-05-22 ENCOUNTER — Encounter: Payer: Self-pay | Admitting: Family Medicine

## 2014-05-22 ENCOUNTER — Ambulatory Visit (INDEPENDENT_AMBULATORY_CARE_PROVIDER_SITE_OTHER): Payer: Self-pay | Admitting: Family Medicine

## 2014-05-22 VITALS — BP 134/82 | HR 85 | Temp 98.6°F | Resp 16 | Ht 66.0 in | Wt 243.1 lb

## 2014-05-22 DIAGNOSIS — R7309 Other abnormal glucose: Secondary | ICD-10-CM

## 2014-05-22 DIAGNOSIS — J3089 Other allergic rhinitis: Secondary | ICD-10-CM

## 2014-05-22 DIAGNOSIS — R7303 Prediabetes: Secondary | ICD-10-CM

## 2014-05-22 DIAGNOSIS — M25569 Pain in unspecified knee: Secondary | ICD-10-CM

## 2014-05-22 DIAGNOSIS — H6121 Impacted cerumen, right ear: Secondary | ICD-10-CM

## 2014-05-22 DIAGNOSIS — I10 Essential (primary) hypertension: Secondary | ICD-10-CM

## 2014-05-22 DIAGNOSIS — F418 Other specified anxiety disorders: Secondary | ICD-10-CM

## 2014-05-22 DIAGNOSIS — E8881 Metabolic syndrome: Secondary | ICD-10-CM

## 2014-05-22 DIAGNOSIS — E785 Hyperlipidemia, unspecified: Secondary | ICD-10-CM

## 2014-05-22 DIAGNOSIS — J0110 Acute frontal sinusitis, unspecified: Secondary | ICD-10-CM

## 2014-05-22 MED ORDER — LORATADINE 10 MG PO TABS: 10.0000 mg | ORAL_TABLET | Freq: Every day | ORAL | Status: DC

## 2014-05-22 MED ORDER — PENICILLIN V POTASSIUM 500 MG PO TABS: 500.0000 mg | ORAL_TABLET | Freq: Three times a day (TID) | ORAL | Status: DC

## 2014-05-22 MED ORDER — HYDROCODONE-ACETAMINOPHEN 5-325 MG PO TABS: 1.0000 | ORAL_TABLET | Freq: Every day | ORAL | Status: DC

## 2014-05-22 MED ORDER — FLUOXETINE HCL 10 MG PO CAPS: 10.0000 mg | ORAL_CAPSULE | Freq: Every day | ORAL | Status: DC

## 2014-05-22 NOTE — Progress Notes (Signed)
Subjective:    Patient ID: Julie Ryan, female    DOB: [birth year], 47 y.o.   MRN: [id number]  HPI 2 week h/o head and chest congestion with sore throat and ear pressure , right more than left, intermittent chills and undocumented fever. Cough is not as severe as head pressure with green nasal drainage Increased depression and anxiety due to poor health of close family members including the passing of a child, not suicidal or homicidal, but has to pushe herself to do anything, wants to hold on psych eval, but will start medication   Review of Systems See HPI  Denies chest pains, palpitations and leg swelling Denies abdominal pain, nausea, vomiting,diarrhea or constipation.   Denies dysuria, frequency, hesitancy or incontinence. Denies joint pain, swelling and limitation in mobility. Denies  seizures, numbness, or tingling. Denies skin break down or rash.        Objective:   Physical Exam BP 134/82 mmHg  Pulse 85  Temp(Src) 98.6 F (37 C) (Oral)  Resp 16  Ht 5\' 6"  (1.676 m)  Wt 243 lb 1.9 oz (110.279 kg)  BMI 39.26 kg/m2  SpO2 99% Patient alert and oriented and in no cardiopulmonary distress.  HEENT: No facial asymmetry, EOMI,   oropharynx pink and moist.  Neck supple no JVD, no mass. Frontal sinus tender, right tM occluded by cerumen, left TM clear Chest: Clear to auscultation bilaterally.  CVS: S1, S2 no murmurs, no S3.Regular rate.  ABD: Soft non tender.   Ext: No edema  MS: Adequate ROM spine, shoulders, hips and knees.  Skin: Intact, no ulcerations or rash noted.  Psych: Good eye contact, depressed , and tearful affect. Memory intact .  CNS: CN 2-12 intact, power,  normal throughout.no focal deficits noted.        Assessment & Plan:  Depression with anxiety Increased personal stress, sister ill and has had recent loss of close family member Not suicidal or homicidal Start fluoxetine daily  KNEE PAIN Increased and uncontrolled start hydrocodone  for management Weight loss strongly encouraged Pain contract discussed and signed  MORBID OBESITY Deteriorated. Patient re-educated about  the importance of commitment to a  minimum of 150 minutes of exercise per week. The importance of healthy food choices with portion control discussed. Encouraged to start a food diary, count calories and to consider  joining a support group. Sample diet sheets offered. Goals set by the patient for the next several months.     Metabolic syndrome X The increased risk of cardiovascular disease associated with this diagnosis, and the need to consistently work on lifestyle to change this is discussed. Following  a  heart healthy diet ,commitment to 30 minutes of exercise at least 5 days per week, as well as control of blood sugar and cholesterol , and achieving a healthy weight are all the areas to be addressed .   Essential hypertension Controlled, no change in medication DASH diet and commitment to daily physical activity for a minimum of 30 minutes discussed and encouraged, as a part of hypertension management. The importance of attaining a healthy weight is also discussed.   Prediabetes Patient educated about the importance of limiting  Carbohydrate intake , the need to commit to daily physical activity for a minimum of 30 minutes , and to commit weight loss. The fact that changes in all these areas will reduce or eliminate all together the development of diabetes is stressed.     Allergic rhinitis Uncontrolled pt to start daily  loratidine  Acute frontal sinusitis 10 day antibiotic course prescribed  Cerumen impaction succesful right ear irrigation by nursing , atraumatic

## 2014-05-22 NOTE — Patient Instructions (Addendum)
F/u early Februaury, call if you need me before  Penicillin prescribed for sinus infection  Loratidine prescribed for daily use for allergies  Hydrocodone 500/325 one daily for left knee pain and right shoulder pain  Pain contract today  Right ear flush today  Please complete depression screen today before you leave  Sign pain contract  Fluoxetine 10mg  one daily for depression  Fasting CBC, lipid, chem 7 , TSH and hBA1C in January  My sincere condolence on your recent loss

## 2014-05-24 ENCOUNTER — Telehealth: Payer: Self-pay | Admitting: Family Medicine

## 2014-05-24 ENCOUNTER — Other Ambulatory Visit: Payer: Self-pay

## 2014-05-24 MED ORDER — PROMETHAZINE-DM 6.25-15 MG/5ML PO SYRP: 5.0000 mL | ORAL_SOLUTION | Freq: Every evening | ORAL | Status: DC | PRN

## 2014-05-24 NOTE — Telephone Encounter (Signed)
States she was here 2 days ago and was treated but she is still coughing a lot and wants some cough syrup sent in. Please advise

## 2014-05-24 NOTE — Telephone Encounter (Signed)
Med sent and pt aware °

## 2014-05-24 NOTE — Telephone Encounter (Signed)
pls send phenergan dm 5cc at bedtime x 120( cc no refill and let her know

## 2014-06-06 ENCOUNTER — Telehealth: Payer: Self-pay | Admitting: *Deleted

## 2014-06-06 NOTE — Telephone Encounter (Signed)
Pt called requesting a refill on all of her medications. Please advise

## 2014-06-07 ENCOUNTER — Other Ambulatory Visit: Payer: Self-pay

## 2014-06-07 MED ORDER — LOVASTATIN 20 MG PO TABS: 40.0000 mg | ORAL_TABLET | Freq: Every day | ORAL | Status: DC

## 2014-06-07 MED ORDER — METOPROLOL TARTRATE 25 MG PO TABS: ORAL_TABLET | ORAL | Status: DC

## 2014-06-07 MED ORDER — TEMAZEPAM 30 MG PO CAPS: ORAL_CAPSULE | ORAL | Status: DC

## 2014-06-07 MED ORDER — PANTOPRAZOLE SODIUM 40 MG PO TBEC: 40.0000 mg | DELAYED_RELEASE_TABLET | Freq: Two times a day (BID) | ORAL | Status: DC

## 2014-06-07 MED ORDER — FUROSEMIDE 40 MG PO TABS: 40.0000 mg | ORAL_TABLET | Freq: Every day | ORAL | Status: DC

## 2014-06-07 NOTE — Telephone Encounter (Signed)
meds refilled 

## 2014-06-14 ENCOUNTER — Other Ambulatory Visit: Payer: Self-pay

## 2014-06-14 MED ORDER — HYDROCODONE-ACETAMINOPHEN 5-325 MG PO TABS: 1.0000 | ORAL_TABLET | Freq: Every day | ORAL | Status: DC

## 2014-07-07 DIAGNOSIS — J0110 Acute frontal sinusitis, unspecified: Secondary | ICD-10-CM | POA: Insufficient documentation

## 2014-07-07 DIAGNOSIS — H6120 Impacted cerumen, unspecified ear: Secondary | ICD-10-CM | POA: Insufficient documentation

## 2014-07-07 NOTE — Assessment & Plan Note (Signed)
Uncontrolled pt to start daily loratidine

## 2014-07-07 NOTE — Assessment & Plan Note (Signed)
Increased personal stress, sister ill and has had recent loss of close family member Not suicidal or homicidal Start fluoxetine daily

## 2014-07-07 NOTE — Assessment & Plan Note (Signed)
Increased and uncontrolled start hydrocodone for management Weight loss strongly encouraged Pain contract discussed and signed

## 2014-07-07 NOTE — Assessment & Plan Note (Signed)
succesful right ear irrigation by nursing , atraumatic

## 2014-07-07 NOTE — Assessment & Plan Note (Signed)
The increased risk of cardiovascular disease associated with this diagnosis, and the need to consistently work on lifestyle to change this is discussed. Following  a  heart healthy diet ,commitment to 30 minutes of exercise at least 5 days per week, as well as control of blood sugar and cholesterol , and achieving a healthy weight are all the areas to be addressed .  

## 2014-07-07 NOTE — Assessment & Plan Note (Signed)
Controlled, no change in medication DASH diet and commitment to daily physical activity for a minimum of 30 minutes discussed and encouraged, as a part of hypertension management. The importance of attaining a healthy weight is also discussed.  

## 2014-07-07 NOTE — Assessment & Plan Note (Signed)
Patient educated about the importance of limiting  Carbohydrate intake , the need to commit to daily physical activity for a minimum of 30 minutes , and to commit weight loss. The fact that changes in all these areas will reduce or eliminate all together the development of diabetes is stressed.    

## 2014-07-07 NOTE — Assessment & Plan Note (Signed)
10 day antibiotic course prescribed 

## 2014-07-07 NOTE — Assessment & Plan Note (Signed)
Deteriorated. Patient re-educated about  the importance of commitment to a  minimum of 150 minutes of exercise per week. The importance of healthy food choices with portion control discussed. Encouraged to start a food diary, count calories and to consider  joining a support group. Sample diet sheets offered. Goals set by the patient for the next several months.    

## 2014-07-10 ENCOUNTER — Telehealth: Payer: Self-pay | Admitting: Family Medicine

## 2014-07-10 ENCOUNTER — Other Ambulatory Visit: Payer: Self-pay

## 2014-07-10 MED ORDER — PROMETHAZINE-DM 6.25-15 MG/5ML PO SYRP: 5.0000 mL | ORAL_SOLUTION | Freq: Every evening | ORAL | Status: DC | PRN

## 2014-07-10 NOTE — Telephone Encounter (Signed)
Coughing up thick yellow phlegm x Monday. Hoarse and cough is worse at night, keeps her up. Trying to get something called in so she doesn't have to miss work. [place]

## 2014-07-10 NOTE — Telephone Encounter (Signed)
Pt aware.

## 2014-07-10 NOTE — Telephone Encounter (Signed)
pls send ion phenergan dm one teaspoon at bedtime as needed x 112 cc Needs OV if fever, chills etc Advise fluids and robitussin/mucinex in the daytime to help with symptoms

## 2014-07-11 LAB — LIPID PANEL
CHOLESTEROL: 210 mg/dL — AB (ref 0–200)
HDL: 68 mg/dL (ref >39)
LDL Cholesterol: 126 mg/dL — ABNORMAL HIGH (ref 0–99)
Total CHOL/HDL Ratio: 3.1 Ratio
Triglycerides: 78 mg/dL (ref <150)
VLDL: 16 mg/dL (ref 0–40)

## 2014-07-11 LAB — CBC WITH DIFFERENTIAL/PLATELET
Basophils Absolute: 0 10*3/uL (ref 0.0–0.1)
Basophils Relative: 0 % (ref 0–1)
EOS PCT: 2 % (ref 0–5)
Eosinophils Absolute: 0.2 10*3/uL (ref 0.0–0.7)
HEMATOCRIT: 35.8 % — AB (ref 36.0–46.0)
Hemoglobin: 11.8 g/dL — ABNORMAL LOW (ref 12.0–15.0)
LYMPHS PCT: 32 % (ref 12–46)
Lymphs Abs: 3.6 10*3/uL (ref 0.7–4.0)
MCH: 28.4 pg (ref 26.0–34.0)
MCHC: 33 g/dL (ref 30.0–36.0)
MCV: 86.3 fL (ref 78.0–100.0)
MONO ABS: 0.6 10*3/uL (ref 0.1–1.0)
MONOS PCT: 5 % (ref 3–12)
MPV: 9.4 fL (ref 9.4–12.4)
NEUTROS ABS: 6.8 10*3/uL (ref 1.7–7.7)
Neutrophils Relative %: 61 % (ref 43–77)
Platelets: 399 10*3/uL (ref 150–400)
RBC: 4.15 MIL/uL (ref 3.87–5.11)
RDW: 14.4 % (ref 11.5–15.5)
WBC: 11.1 10*3/uL — AB (ref 4.0–10.5)

## 2014-07-11 LAB — BASIC METABOLIC PANEL
BUN: 10 mg/dL (ref 6–23)
CO2: 29 mEq/L (ref 19–32)
Calcium: 9.2 mg/dL (ref 8.4–10.5)
Chloride: 99 mEq/L (ref 96–112)
Creat: 0.81 mg/dL (ref 0.50–1.10)
Glucose, Bld: 86 mg/dL (ref 70–99)
Potassium: 3.9 mEq/L (ref 3.5–5.3)
SODIUM: 138 meq/L (ref 135–145)

## 2014-07-11 LAB — TSH: TSH: 1.546 u[IU]/mL (ref 0.350–4.500)

## 2014-07-11 LAB — HEMOGLOBIN A1C
Hgb A1c MFr Bld: 6 % — ABNORMAL HIGH (ref <5.7)
Mean Plasma Glucose: 126 mg/dL — ABNORMAL HIGH (ref <117)

## 2014-07-29 ENCOUNTER — Encounter (INDEPENDENT_AMBULATORY_CARE_PROVIDER_SITE_OTHER): Payer: Self-pay | Admitting: Internal Medicine

## 2014-07-29 ENCOUNTER — Ambulatory Visit (INDEPENDENT_AMBULATORY_CARE_PROVIDER_SITE_OTHER): Payer: 59 | Admitting: Internal Medicine

## 2014-07-29 VITALS — BP 118/70 | HR 68 | Temp 98.4°F | Resp 18 | Ht 66.0 in | Wt 253.5 lb

## 2014-07-29 DIAGNOSIS — K2100 Gastro-esophageal reflux disease with esophagitis, without bleeding: Secondary | ICD-10-CM

## 2014-07-29 NOTE — Patient Instructions (Signed)
Try decreasing pantoprazole dose to 40 mg daily before breakfast. If symptoms relapse can go back to twice daily schedule. Progress report in one month. Screening colonoscopy be scheduled sometime after July this year.

## 2014-07-29 NOTE — Progress Notes (Signed)
Presenting complaint;  Follow-up for chronic GERD.  Subjective:  Patient is 49 year old African-American female who has history of erosive reflux esophagitis and is here for yearly visit. She was last seen in December 2014. She is on pantoprazole 40 mg twice daily and not having any problems. She does not remember the last time she had heartburn. She denies dysphagia hoarseness chronic cough or sore throat. She also denies abdominal pain melena or rectal bleeding diarrhea or constipation. She has gained 12 pounds since her last visit. She has not been able to walk or exercise and account of knee pain. She takes she would like to go back on phentermine as before and will talk with Dr. Moshe Ryan. Family history is negative for CRC. Patient states her second died in 12/22/2022 last year on account of enlarging intracranial cyst. He had recurrent headaches thought to be migraine and when the cyst was discovered was too late.  Current Medications: Outpatient Encounter Prescriptions as of 07/29/2014  Medication Sig  . fluticasone (FLONASE) 50 MCG/ACT nasal spray Place 1 spray into both nostrils daily.  . furosemide (LASIX) 40 MG tablet Take 1 tablet (40 mg total) by mouth daily.  Marland Kitchen HYDROcodone-acetaminophen (NORCO/VICODIN) 5-325 MG per tablet Take 1 tablet by mouth daily.  Marland Kitchen ibuprofen (ADVIL,MOTRIN) 800 MG tablet Take 1 tablet (800 mg total) by mouth every 6 (six) hours as needed.  . loratadine (CLARITIN) 10 MG tablet Take 1 tablet (10 mg total) by mouth daily.  Marland Kitchen lovastatin (MEVACOR) 20 MG tablet Take 2 tablets (40 mg total) by mouth at bedtime.  . metoprolol tartrate (LOPRESSOR) 25 MG tablet TAKE 1 TABLET BY MOUTH TWICE A DAY FOR BLOOD PRESSURE.  . pantoprazole (PROTONIX) 40 MG tablet Take 1 tablet (40 mg total) by mouth 2 (two) times daily.  . promethazine-dextromethorphan (PROMETHAZINE-DM) 6.25-15 MG/5ML syrup Take 5 mLs by mouth at bedtime as needed for cough.  . temazepam (RESTORIL) 30 MG capsule TAKE  ONE CAPSULE BY MOUTH AT BEDTIME AS NEEDED FOR SLEEP.  Marland Kitchen Phentermine HCl 37.5 MG TBDP One tablet once daily at breakfast  . [DISCONTINUED] FLUoxetine (PROZAC) 10 MG capsule Take 1 capsule (10 mg total) by mouth daily. (Patient not taking: Reported on 07/29/2014)  . [DISCONTINUED] penicillin v potassium (VEETID) 500 MG tablet Take 1 tablet (500 mg total) by mouth 3 (three) times daily. (Patient not taking: Reported on 07/29/2014)  . [DISCONTINUED] promethazine-dextromethorphan (PROMETHAZINE-DM) 6.25-15 MG/5ML syrup Take 5 mLs by mouth at bedtime as needed for cough. (Patient not taking: Reported on 07/29/2014)     Objective: Blood pressure 118/70, pulse 68, temperature 98.4 F (36.9 C), temperature source Oral, resp. rate 18, height 5\' 6"  (1.676 m), weight 253 lb 8 oz (114.987 kg), last menstrual period 07/03/2014. Patient is alert and in no acute distress. Conjunctiva is pink. Sclera is nonicteric Oropharyngeal mucosa is normal. No neck masses or thyromegaly noted. Cardiac exam with regular rhythm normal S1 and S2. No murmur or gallop noted. Lungs are clear to auscultation. Abdomen is full but soft and nontender without organomegaly or masses. No LE edema or clubbing noted.     Assessment:  #1. GERD. She has history of erosive reflux esophagitis and is doing well with anti-reflux margins and double dose PPI. I would like to see how she does by dropping PPI dose to once a day.  #2. Patient is average risk for CRC. She had 48th birthday on 01/02/2016. Screening options discussed with patient. She would like to undergo colonoscopy later this.  Plan:  Patient advised that she needs to work on losing weight. She will try decreasing pantoprazole dose to 40 mg by mouth every morning. If she starts having breakthrough symptoms she'll go back to twice a day schedule. He should advised to call office with progress report in 1 month at which time will determine if prescription should be  changed. Screening colonoscopy later this year. Patient asked that we remind her in July this year.

## 2014-08-01 ENCOUNTER — Ambulatory Visit (INDEPENDENT_AMBULATORY_CARE_PROVIDER_SITE_OTHER): Payer: 59 | Admitting: Family Medicine

## 2014-08-01 ENCOUNTER — Encounter: Payer: Self-pay | Admitting: Family Medicine

## 2014-08-01 VITALS — BP 118/70 | HR 93 | Resp 16 | Ht 66.0 in | Wt 250.0 lb

## 2014-08-01 DIAGNOSIS — R7303 Prediabetes: Secondary | ICD-10-CM

## 2014-08-01 DIAGNOSIS — Z1231 Encounter for screening mammogram for malignant neoplasm of breast: Secondary | ICD-10-CM

## 2014-08-01 DIAGNOSIS — I10 Essential (primary) hypertension: Secondary | ICD-10-CM

## 2014-08-01 DIAGNOSIS — F418 Other specified anxiety disorders: Secondary | ICD-10-CM

## 2014-08-01 DIAGNOSIS — R059 Cough, unspecified: Secondary | ICD-10-CM

## 2014-08-01 DIAGNOSIS — E785 Hyperlipidemia, unspecified: Secondary | ICD-10-CM

## 2014-08-01 DIAGNOSIS — R7309 Other abnormal glucose: Secondary | ICD-10-CM

## 2014-08-01 DIAGNOSIS — R05 Cough: Secondary | ICD-10-CM

## 2014-08-01 DIAGNOSIS — G4700 Insomnia, unspecified: Secondary | ICD-10-CM

## 2014-08-01 DIAGNOSIS — R042 Hemoptysis: Secondary | ICD-10-CM

## 2014-08-01 DIAGNOSIS — E8881 Metabolic syndrome: Secondary | ICD-10-CM

## 2014-08-01 MED ORDER — VENLAFAXINE HCL ER 75 MG PO CP24: 75.0000 mg | ORAL_CAPSULE | Freq: Every day | ORAL | Status: DC

## 2014-08-01 MED ORDER — PHENTERMINE HCL 37.5 MG PO TBDP: ORAL_TABLET | ORAL | Status: DC

## 2014-08-01 NOTE — Progress Notes (Signed)
Subjective:    Patient ID: Julie Ryan, female    DOB: 1965/09/02, 49 y.o.   MRN: [id number]  HPI The PT is here for follow up and re-evaluation of chronic medical conditions, medication management and review of any available recent lab and radiology data.  Preventive health is updated, specifically  Cancer screening and Immunization.  Asking to hold on her colonoscopy Questions or concerns regarding consultations or procedures which the PT has had in the interim are  Addressed.Recently saw Gi The PT denies any adverse reactions to current medications since the last visit.  Wants to resume phentermine as weight has continued to increase, also her depression and anxiety are worsenign, her personal life is in trouble, the loss of her son in 2015 , is compounded by marked deterioration in her relationship with her daughter who she lives with        Review of Systems See HPI Denies recent fever or chills. Denies sinus pressure, nasal congestion, ear pain or sore throat. Denies chest congestion, productive cough or wheezing. Denies chest pains, palpitations and leg swelling Denies abdominal pain, nausea, vomiting,diarrhea or constipation.   Denies dysuria, frequency, hesitancy or incontinence. Denies joint pain, swelling and limitation in mobility. Denies headaches, seizures, numbness, or tingling. C/o  Increased depression and  Anxiety, has very poor relationships with her only living child, which is extremely stressful.Her insomnia is adequately treated  Denies skin break down or rash.        Objective:   Physical Exam BP 118/70 mmHg  Pulse 93  Resp 16  Ht 5\' 6"  (1.676 m)  Wt 250 lb (113.399 kg)  BMI 40.37 kg/m2  SpO2 97%  LMP 07/03/2014 Patient alert and oriented and in no cardiopulmonary distress.Tearful HEENT: No facial asymmetry, EOMI,   oropharynx pink and moist.  Neck supple no JVD, no mass.  Chest: Clear to auscultation bilaterally.  CVS: S1, S2 no murmurs, no  S3.Regular rate.  ABD: Soft non tender.   Ext: No edema  MS: Adequate ROM spine, shoulders, hips and knees.  Skin: Intact, no ulcerations or rash noted.  Psych: Poor eye contact, flat affect, tearful, both depressed and anxious  CNS: CN 2-12 intact, power,  normal throughout.no focal deficits noted.        Assessment & Plan:  Depression with anxiety Significant personal stress, lost her son and has poor relationship with her daughter who lives with her Refer to therapist start fluoxetine , efexor initially prescribed, but pt called back reporting intolerance, she again called back and will try wellbutrin, if states unable to take this she is tob referred to psychiatrist also Currently she is not suicidal or homicidal   Essential hypertension Controlled, no change in medication DASH diet and commitment to daily physical activity for a minimum of 30 minutes discussed and encouraged, as a part of hypertension management. The importance of attaining a healthy weight is also discussed.    Metabolic syndrome X The increased risk of cardiovascular disease associated with this diagnosis, and the need to consistently work on lifestyle to change this is discussed. Following  a  heart healthy diet ,commitment to 30 minutes of exercise at least 5 days per week, as well as control of blood sugar and cholesterol , and achieving a healthy weight are all the areas to be addressed .    MORBID OBESITY Deteriorated. Patient re-educated about  the importance of commitment to a  minimum of 150 minutes of exercise per week. The importance of  healthy food choices with portion control discussed. Encouraged to start a food diary, count calories and to consider  joining a support group. Sample diet sheets offered. Goals set by the patient for the next several months.      Hyperlipidemia Deteriorated, needs to resume statin Hyperlipidemia:Low fat diet discussed and  encouraged.     Prediabetes Slight improvement in past 12 month, pt applauded on this Patient advised to reduce carb and sweets, commit to regular physical activity, take meds as prescribed, test blood as directed, and attempt to lose weight, to improve blood sugar control.    Cough Pt to have a CXR for complaint, this is ordered   Insomnia Sleep hygiene reviewed and written information offered also. Prescription sent for  medication needed.

## 2014-08-01 NOTE — Patient Instructions (Addendum)
F/u 2nd wek in March, call if you need me before  New for depression is effexor 1 daily and  you are referred to a therapist  New to help with weight is phentermine one daily  CXR due to coughing up blood one time and h/o intermittent nicotine use  Pls reduce fried and fatty foods, carbs and sweets

## 2014-08-02 ENCOUNTER — Other Ambulatory Visit: Payer: Self-pay | Admitting: Family Medicine

## 2014-08-02 ENCOUNTER — Telehealth: Payer: Self-pay

## 2014-08-02 DIAGNOSIS — Z1231 Encounter for screening mammogram for malignant neoplasm of breast: Secondary | ICD-10-CM

## 2014-08-02 MED ORDER — FLUOXETINE HCL 20 MG PO TABS: 20.0000 mg | ORAL_TABLET | Freq: Every day | ORAL | Status: DC

## 2014-08-02 NOTE — Telephone Encounter (Signed)
I recommend she see psych to treat her depression since severe sand she is reporting allergy to the 2 main grps of antidepressants that I prescribe. She agreed to seeing the therapist pls also refer to the psychiatrist at [facility] for depression, pls explain why I recommend this , pls let me know if this is  A problem , I will sign the refferal dx is depression

## 2014-08-02 NOTE — Assessment & Plan Note (Addendum)
Significant personal stress, lost her son and has poor relationship with her daughter who lives with her Refer to therapist start fluoxetine , efexor initially prescribed, but pt called back reporting intolerance, she again called back and will try wellbutrin, if states unable to take this she is tob referred to psychiatrist also Currently she is not suicidal or homicidal

## 2014-08-02 NOTE — Telephone Encounter (Signed)
Patient states that she is unable to take fluoxetine either.  Will add both of these to allergy list.  Please advise.

## 2014-08-02 NOTE — Telephone Encounter (Signed)
I have sent in fluoxetine in its place, pls notify pt and pharmacy

## 2014-08-03 ENCOUNTER — Telehealth: Payer: Self-pay | Admitting: Family Medicine

## 2014-08-03 DIAGNOSIS — R05 Cough: Secondary | ICD-10-CM | POA: Insufficient documentation

## 2014-08-03 DIAGNOSIS — R059 Cough, unspecified: Secondary | ICD-10-CM | POA: Insufficient documentation

## 2014-08-03 NOTE — Assessment & Plan Note (Signed)
Deteriorated. Patient re-educated about  the importance of commitment to a  minimum of 150 minutes of exercise per week. The importance of healthy food choices with portion control discussed. Encouraged to start a food diary, count calories and to consider  joining a support group. Sample diet sheets offered. Goals set by the patient for the next several months.    

## 2014-08-03 NOTE — Assessment & Plan Note (Signed)
Slight improvement in past 12 month, pt applauded on this Patient advised to reduce carb and sweets, commit to regular physical activity, take meds as prescribed, test blood as directed, and attempt to lose weight, to improve blood sugar control.

## 2014-08-03 NOTE — Telephone Encounter (Signed)
Pls call pt, I will prescribe wellbutrin, which is different from the medications which I have already recommended that she has had probs with , for depression. I have tried to contact her and not been able to get her, so I have gone ahead and entered the wellbutrin, it will need to be sent in if she agrees. I f she does not want this pls LET ME KNOW , I will spk with her directly, thanks you

## 2014-08-03 NOTE — Assessment & Plan Note (Signed)
The increased risk of cardiovascular disease associated with this diagnosis, and the need to consistently work on lifestyle to change this is discussed. Following  a  heart healthy diet ,commitment to 30 minutes of exercise at least 5 days per week, as well as control of blood sugar and cholesterol , and achieving a healthy weight are all the areas to be addressed .  

## 2014-08-03 NOTE — Assessment & Plan Note (Signed)
Deteriorated, needs to resume statin Hyperlipidemia:Low fat diet discussed and encouraged.

## 2014-08-03 NOTE — Assessment & Plan Note (Signed)
Sleep hygiene reviewed and written information offered also. Prescription sent for  medication needed.  

## 2014-08-03 NOTE — Assessment & Plan Note (Signed)
Pt to have a CXR for complaint, this is ordered

## 2014-08-03 NOTE — Assessment & Plan Note (Signed)
Controlled, no change in medication DASH diet and commitment to daily physical activity for a minimum of 30 minutes discussed and encouraged, as a part of hypertension management. The importance of attaining a healthy weight is also discussed.  

## 2014-08-05 MED ORDER — BUPROPION HCL ER (XL) 150 MG PO TB24: 150.0000 mg | ORAL_TABLET | Freq: Every day | ORAL | Status: DC

## 2014-08-05 NOTE — Telephone Encounter (Signed)
Patient states that she has never been on Wellbutrin and is willing to try.  What dose would you like to send?

## 2014-08-05 NOTE — Telephone Encounter (Addendum)
Script is sent in for 150 mg one daily, let her knwo this may also help with weight loss, (if you need to spk with her again)

## 2014-08-05 NOTE — Telephone Encounter (Signed)
Patient aware.

## 2014-08-05 NOTE — Telephone Encounter (Signed)
Patient would like to try Wellbutrin first

## 2014-08-05 NOTE — Addendum Note (Signed)
Addended by: Tula Nakayama E on: 08/05/2014 11:18 AM   Modules accepted: Orders

## 2014-08-08 ENCOUNTER — Ambulatory Visit (HOSPITAL_COMMUNITY): Payer: 59

## 2014-08-08 ENCOUNTER — Ambulatory Visit (HOSPITAL_COMMUNITY)
Admission: RE | Admit: 2014-08-08 | Discharge: 2014-08-08 | Disposition: A | Discharge status: Home / Self Care | Payer: 59 | Source: Ambulatory Visit | Attending: Family Medicine | Admitting: Family Medicine

## 2014-08-08 DIAGNOSIS — R059 Cough, unspecified: Secondary | ICD-10-CM

## 2014-08-08 DIAGNOSIS — R042 Hemoptysis: Secondary | ICD-10-CM

## 2014-08-08 DIAGNOSIS — Z1231 Encounter for screening mammogram for malignant neoplasm of breast: Secondary | ICD-10-CM | POA: Diagnosis present

## 2014-08-08 DIAGNOSIS — R05 Cough: Secondary | ICD-10-CM

## 2014-09-30 ENCOUNTER — Encounter: Payer: Self-pay | Admitting: Family Medicine

## 2014-09-30 ENCOUNTER — Ambulatory Visit (INDEPENDENT_AMBULATORY_CARE_PROVIDER_SITE_OTHER): Payer: 59 | Admitting: Family Medicine

## 2014-09-30 VITALS — BP 130/82 | HR 91 | Resp 16 | Ht 66.0 in | Wt 248.0 lb

## 2014-09-30 DIAGNOSIS — R7303 Prediabetes: Secondary | ICD-10-CM

## 2014-09-30 DIAGNOSIS — J3089 Other allergic rhinitis: Secondary | ICD-10-CM

## 2014-09-30 DIAGNOSIS — F418 Other specified anxiety disorders: Secondary | ICD-10-CM

## 2014-09-30 DIAGNOSIS — G4730 Sleep apnea, unspecified: Secondary | ICD-10-CM | POA: Diagnosis not present

## 2014-09-30 DIAGNOSIS — K2100 Gastro-esophageal reflux disease with esophagitis, without bleeding: Secondary | ICD-10-CM

## 2014-09-30 DIAGNOSIS — R0683 Snoring: Secondary | ICD-10-CM

## 2014-09-30 DIAGNOSIS — R7309 Other abnormal glucose: Secondary | ICD-10-CM

## 2014-09-30 DIAGNOSIS — M25562 Pain in left knee: Secondary | ICD-10-CM | POA: Diagnosis not present

## 2014-09-30 DIAGNOSIS — I10 Essential (primary) hypertension: Secondary | ICD-10-CM

## 2014-09-30 DIAGNOSIS — E785 Hyperlipidemia, unspecified: Secondary | ICD-10-CM

## 2014-09-30 DIAGNOSIS — E559 Vitamin D deficiency, unspecified: Secondary | ICD-10-CM

## 2014-09-30 DIAGNOSIS — E8881 Metabolic syndrome: Secondary | ICD-10-CM

## 2014-09-30 MED ORDER — TRAMADOL HCL 50 MG PO TABS: ORAL_TABLET | ORAL | Status: DC

## 2014-09-30 MED ORDER — METFORMIN HCL ER (OSM) 500 MG PO TB24: 500.0000 mg | ORAL_TABLET | Freq: Every day | ORAL | Status: DC

## 2014-09-30 MED ORDER — PHENTERMINE HCL 37.5 MG PO TABS: 37.5000 mg | ORAL_TABLET | Freq: Every day | ORAL | Status: DC

## 2014-09-30 NOTE — Assessment & Plan Note (Addendum)
Intermittent pain, needs access to pain med on a daily basis for better symptom control, will prescribe tramadol. Weight loss strongly encouraged

## 2014-09-30 NOTE — Assessment & Plan Note (Addendum)
Improved. Patient educated about the importance of limiting  Carbohydrate intake , the need to commit to daily physical activity for a minimum of 30 minutes , and to commit weight loss. The fact that changes in all these areas will reduce or eliminate all together the development of diabetes is stressed.   Diabetic Labs Latest Ref Rng 07/10/2014 07/03/2013 11/20/2012 08/05/2011 01/15/2011  HbA1c <5.7 % 6.0(H) 6.2(H) 5.8(H) 6.0(H) 6.4(H)  Chol 0 - 200 mg/dL 210(H) 168 171 - 162  HDL >39 mg/dL 68 47 49 - 51  Calc LDL 0 - 99 mg/dL 126(H) 105(H) 105(H) - 93  Triglycerides <150 mg/dL 78 81 85 - 91  Creatinine 0.50 - 1.10 mg/dL 0.81 0.90 0.73 - 1.09   BP/Weight 09/30/2014 08/01/2014 07/29/2014 05/22/2014 07/03/2013 06/25/2013 81/15/7262  Systolic BP 035 597 416 384 536 468 032  Diastolic BP 82 70 70 82 72 50 73  Wt. (Lbs) 248 250 253.5 243.12 243.12 241.6 246  BMI 40.05 40.37 40.94 39.26 39.26 39.01 39.72   No flowsheet data found.

## 2014-09-30 NOTE — Patient Instructions (Addendum)
F/u in 3 months, call if you need me before  Change health habits as discussed slowly and consistently  QUIT nicotine, good that smoking very little  Weight loss goal of 4 pounds per month, inc exercise anc continue half phentermine  New is metformin and tramadol   Fasting lipid, cmp and EGFr, hBa1C, HIV in 3 month  Start oTC vit D3 800 IU once daily

## 2014-09-30 NOTE — Assessment & Plan Note (Signed)
Improved. Patient re-educated about  the importance of commitment to a  minimum of 150 minutes of exercise per week.  The importance of healthy food choices with portion control discussed. Encouraged to start a food diary, count calories and to consider  joining a support group. Sample diet sheets offered. Goals set by the patient for the next several months.   Wt Readings from Last 3 Encounters:  09/30/14 248 lb (112.492 kg)  08/01/14 250 lb (113.399 kg)  07/29/14 253 lb 8 oz (114.987 kg)    Body mass index is 40.05 kg/(m^2).  Current exercise per week 100  minutes.

## 2014-10-08 ENCOUNTER — Telehealth: Payer: Self-pay | Admitting: *Deleted

## 2014-10-08 MED ORDER — METFORMIN HCL ER 500 MG PO TB24: 500.0000 mg | ORAL_TABLET | Freq: Every day | ORAL | Status: DC

## 2014-10-08 NOTE — Telephone Encounter (Signed)
Changed to reg metformin Er and sent to [facility] to see if it was cheaper

## 2014-10-08 NOTE — Telephone Encounter (Signed)
Pt called stating her Metformin at [facility] is going to cost 200$ and pt is wanting to see if it can go to another pharmacy cheaper. Please advise [phone]

## 2014-10-11 ENCOUNTER — Other Ambulatory Visit: Payer: Self-pay

## 2014-10-11 ENCOUNTER — Telehealth: Payer: Self-pay | Admitting: Family Medicine

## 2014-10-11 DIAGNOSIS — J309 Allergic rhinitis, unspecified: Secondary | ICD-10-CM

## 2014-10-11 MED ORDER — LOVASTATIN 20 MG PO TABS: 40.0000 mg | ORAL_TABLET | Freq: Every day | ORAL | Status: DC

## 2014-10-11 MED ORDER — PROMETHAZINE-DM 6.25-15 MG/5ML PO SYRP: 5.0000 mL | ORAL_SOLUTION | Freq: Every evening | ORAL | Status: DC | PRN

## 2014-10-11 MED ORDER — PREDNISONE 5 MG PO TABS: 5.0000 mg | ORAL_TABLET | Freq: Two times a day (BID) | ORAL | Status: DC

## 2014-10-11 MED ORDER — PANTOPRAZOLE SODIUM 40 MG PO TBEC: 40.0000 mg | DELAYED_RELEASE_TABLET | Freq: Two times a day (BID) | ORAL | Status: DC

## 2014-10-11 MED ORDER — METOPROLOL TARTRATE 25 MG PO TABS: ORAL_TABLET | ORAL | Status: DC

## 2014-10-11 MED ORDER — FUROSEMIDE 40 MG PO TABS: 40.0000 mg | ORAL_TABLET | Freq: Every day | ORAL | Status: DC

## 2014-10-11 MED ORDER — FLUTICASONE PROPIONATE 50 MCG/ACT NA SUSP: 1.0000 | Freq: Every day | NASAL | Status: DC

## 2014-10-11 MED ORDER — METFORMIN HCL ER 500 MG PO TB24: 500.0000 mg | ORAL_TABLET | Freq: Every day | ORAL | Status: DC

## 2014-10-11 MED ORDER — LORATADINE 10 MG PO TABS: 10.0000 mg | ORAL_TABLET | Freq: Every day | ORAL | Status: DC

## 2014-10-11 NOTE — Telephone Encounter (Signed)
Has a cough that started wed night. Dry hacking cough, no fever, has been using robitussin and allergy med. Still gets worse at night and she can't sleep. Please advise. Wants med sent to [facility]

## 2014-10-11 NOTE — Telephone Encounter (Signed)
Please send in prednisone 5 mg one twice daily #10 only . No refills. Also phenergan DM one tsp at bedtime as needed for excessive cough x 118 ml only, no refills. Advise this for uncontrolled allergy symptoms please.

## 2014-10-11 NOTE — Telephone Encounter (Signed)
Called to ask about when cough started, if its productive and if she has had fever. Will speak with pt when she calls back

## 2014-10-11 NOTE — Telephone Encounter (Signed)
Patient aware and med sent  

## 2014-10-30 ENCOUNTER — Telehealth: Payer: Self-pay | Admitting: Family Medicine

## 2014-10-31 NOTE — Telephone Encounter (Signed)
Will fax.

## 2014-11-10 NOTE — Assessment & Plan Note (Signed)
Controlled, no change in medication DASH diet and commitment to daily physical activity for a minimum of 30 minutes discussed and encouraged, as a part of hypertension management. The importance of attaining a healthy weight is also discussed.  BP/Weight 09/30/2014 08/01/2014 07/29/2014 05/22/2014 07/03/2013 06/25/2013 62/70/3500  Systolic BP 938 182 993 716 967 893 810  Diastolic BP 82 70 70 82 72 50 73  Wt. (Lbs) 248 250 253.5 243.12 243.12 241.6 246  BMI 40.05 40.37 40.94 39.26 39.26 39.01 39.72

## 2014-11-10 NOTE — Assessment & Plan Note (Signed)
Updated lab needed at/ before next visit.   

## 2014-11-10 NOTE — Progress Notes (Signed)
Subjective:    Patient ID: Julie Ryan, female    DOB: [birth year], 49 y.o.   MRN: [id number]  HPI The PT is here for follow up and re-evaluation of chronic medical conditions, medication management and review of any available recent lab and radiology data.  Preventive health is updated, specifically  Cancer screening and Immunization.   C/o chronic fatigue and excess daytime sleepiness, posiutive ho excess snoring, now decided to get sleep study The PT denies any adverse reactions to current medications since the last visit.  C/o increased and uncontrolled left  knee pain wants improved pain management, still working on weight loss    Review of Systems See HPI Denies recent fever or chills. Denies sinus pressure, nasal congestion, ear pain or sore throat. Denies chest congestion, productive cough or wheezing. Denies chest pains, palpitations and leg swelling Denies abdominal pain, nausea, vomiting,diarrhea or constipation.   Denies dysuria, frequency, hesitancy or incontinence. Denies headaches, seizures, numbness, or tingling. Denies uncontrolled  depression, anxiety or insomnia.Using he faith to deal with her day to day problems with success Denies skin break down or rash.        Objective:   Physical Exam BP 130/82 mmHg  Pulse 91  Resp 16  Ht 5\' 6"  (1.676 m)  Wt 248 lb (112.492 kg)  BMI 40.05 kg/m2  SpO2 98% Patient alert and oriented and in no cardiopulmonary distress.  HEENT: No facial asymmetry, EOMI,   oropharynx pink and moist.  Neck supple no JVD, no mass.  Chest: Clear to auscultation bilaterally.  CVS: S1, S2 no murmurs, no S3.Regular rate.  ABD: Soft non tender.   Ext: No edema  MS: Adequate ROM spine, shoulders, hips and reduced in left  knee.  Skin: Intact, no ulcerations or rash noted.  Psych: Good eye contact, normal affect. Memory intact not anxious or depressed appearing.  CNS: CN 2-12 intact, power,  normal throughout.no focal deficits  noted.        Assessment & Plan:  Prediabetes Improved. Patient educated about the importance of limiting  Carbohydrate intake , the need to commit to daily physical activity for a minimum of 30 minutes , and to commit weight loss. The fact that changes in all these areas will reduce or eliminate all together the development of diabetes is stressed.   Diabetic Labs Latest Ref Rng 07/10/2014 07/03/2013 11/20/2012 08/05/2011 01/15/2011  HbA1c <5.7 % 6.0(H) 6.2(H) 5.8(H) 6.0(H) 6.4(H)  Chol 0 - 200 mg/dL 210(H) 168 171 - 162  HDL >39 mg/dL 68 47 49 - 51  Calc LDL 0 - 99 mg/dL 126(H) 105(H) 105(H) - 93  Triglycerides <150 mg/dL 78 81 85 - 91  Creatinine 0.50 - 1.10 mg/dL 0.81 0.90 0.73 - 1.09   BP/Weight 09/30/2014 08/01/2014 07/29/2014 05/22/2014 07/03/2013 06/25/2013 84/16/6063  Systolic BP 016 010 932 355 732 202 542  Diastolic BP 82 70 70 82 72 50 73  Wt. (Lbs) 248 250 253.5 243.12 243.12 241.6 246  BMI 40.05 40.37 40.94 39.26 39.26 39.01 39.72   No flowsheet data found.      MORBID OBESITY Improved. Patient re-educated about  the importance of commitment to a  minimum of 150 minutes of exercise per week.  The importance of healthy food choices with portion control discussed. Encouraged to start a food diary, count calories and to consider  joining a support group. Sample diet sheets offered. Goals set by the patient for the next several months.   Wt Readings from Last  3 Encounters:  09/30/14 248 lb (112.492 kg)  08/01/14 250 lb (113.399 kg)  07/29/14 253 lb 8 oz (114.987 kg)    Body mass index is 40.05 kg/(m^2).  Current exercise per week 100  minutes.    Left knee pain Intermittent pain, needs access to pain med on a daily basis for better symptom control, will prescribe tramadol. Weight loss strongly encouraged   Hyperlipidemia Hyperlipidemia:Low fat diet discussed and encouraged. Uncontrolled, compliance with diet and medication discussed  Lipid Panel  Lab  Results  Component Value Date   CHOL 210* 07/10/2014   HDL 68 07/10/2014   LDLCALC 126* 07/10/2014   TRIG 78 07/10/2014   CHOLHDL 3.1 07/10/2014         Essential hypertension Controlled, no change in medication DASH diet and commitment to daily physical activity for a minimum of 30 minutes discussed and encouraged, as a part of hypertension management. The importance of attaining a healthy weight is also discussed.  BP/Weight 09/30/2014 08/01/2014 07/29/2014 05/22/2014 07/03/2013 06/25/2013 55/20/8022  Systolic BP 336 122 449 753 005 110 211  Diastolic BP 82 70 70 82 72 50 73  Wt. (Lbs) 248 250 253.5 243.12 243.12 241.6 246  BMI 40.05 40.37 40.94 39.26 39.26 39.01 39.72         Allergic rhinitis Improved with season change but adequately controlled   GERD (gastroesophageal reflux disease) Controlled, no change in medication    Depression with anxiety Improved, patient off medication and doing well   Metabolic syndrome X The increased risk of cardiovascular disease associated with this diagnosis, and the need to consistently work on lifestyle to change this is discussed. Following  a  heart healthy diet ,commitment to 30 minutes of exercise at least 5 days per week, as well as control of blood sugar and cholesterol , and achieving a healthy weight are all the areas to be addressed . \   Snoring Excessive snoring with fatigue  Will refer to neurology for eval for sleep apnea, which I have recommended in the past, pt states she will go at this time

## 2014-11-10 NOTE — Assessment & Plan Note (Signed)
Hyperlipidemia:Low fat diet discussed and encouraged. Uncontrolled, compliance with diet and medication discussed  Lipid Panel  Lab Results  Component Value Date   CHOL 210* 07/10/2014   HDL 68 07/10/2014   LDLCALC 126* 07/10/2014   TRIG 78 07/10/2014   CHOLHDL 3.1 07/10/2014

## 2014-11-10 NOTE — Assessment & Plan Note (Signed)
Excessive snoring with fatigue  Will refer to neurology for eval for sleep apnea, which I have recommended in the past, pt states she will go at this time

## 2014-11-10 NOTE — Assessment & Plan Note (Signed)
The increased risk of cardiovascular disease associated with this diagnosis, and the need to consistently work on lifestyle to change this is discussed. Following  a  heart healthy diet ,commitment to 30 minutes of exercise at least 5 days per week, as well as control of blood sugar and cholesterol , and achieving a healthy weight are all the areas to be addressed .  

## 2014-11-10 NOTE — Assessment & Plan Note (Signed)
Improved, patient off medication and doing well

## 2014-11-10 NOTE — Assessment & Plan Note (Signed)
Improved with season change but adequately controlled

## 2014-11-10 NOTE — Assessment & Plan Note (Signed)
Controlled, no change in medication  

## 2014-11-19 ENCOUNTER — Telehealth: Payer: Self-pay | Admitting: *Deleted

## 2014-11-19 MED ORDER — PHENTERMINE HCL 37.5 MG PO TABS: 37.5000 mg | ORAL_TABLET | Freq: Every day | ORAL | Status: DC

## 2014-11-19 NOTE — Telephone Encounter (Signed)
Med will be faxed

## 2014-11-19 NOTE — Telephone Encounter (Signed)
Pt called requesting  weight loss pills to be sent to [facility]

## 2014-12-28 LAB — COMPLETE METABOLIC PANEL WITH GFR
ALT: 13 U/L (ref 0–35)
AST: 21 U/L (ref 0–37)
Albumin: 3.9 g/dL (ref 3.5–5.2)
Alkaline Phosphatase: 82 U/L (ref 39–117)
BILIRUBIN TOTAL: 0.8 mg/dL (ref 0.2–1.2)
BUN: 11 mg/dL (ref 6–23)
CO2: 24 mEq/L (ref 19–32)
Calcium: 9.4 mg/dL (ref 8.4–10.5)
Chloride: 103 mEq/L (ref 96–112)
Creat: 0.8 mg/dL (ref 0.50–1.10)
GFR, Est Non African American: 86 mL/min
Glucose, Bld: 100 mg/dL — ABNORMAL HIGH (ref 70–99)
POTASSIUM: 4.2 meq/L (ref 3.5–5.3)
Sodium: 140 mEq/L (ref 135–145)
TOTAL PROTEIN: 7.5 g/dL (ref 6.0–8.3)

## 2014-12-28 LAB — HEMOGLOBIN A1C
Hgb A1c MFr Bld: 6 % — ABNORMAL HIGH (ref <5.7)
MEAN PLASMA GLUCOSE: 126 mg/dL — AB (ref <117)

## 2014-12-28 LAB — LIPID PANEL
CHOL/HDL RATIO: 3.7 ratio
CHOLESTEROL: 187 mg/dL (ref 0–200)
HDL: 50 mg/dL (ref >=46)
LDL Cholesterol: 121 mg/dL — ABNORMAL HIGH (ref 0–99)
Triglycerides: 78 mg/dL (ref <150)
VLDL: 16 mg/dL (ref 0–40)

## 2014-12-31 ENCOUNTER — Ambulatory Visit (INDEPENDENT_AMBULATORY_CARE_PROVIDER_SITE_OTHER): Payer: Self-pay | Admitting: Family Medicine

## 2014-12-31 ENCOUNTER — Encounter: Payer: Self-pay | Admitting: Family Medicine

## 2014-12-31 VITALS — BP 126/80 | HR 76 | Resp 18 | Ht 66.0 in | Wt 245.1 lb

## 2014-12-31 DIAGNOSIS — K2100 Gastro-esophageal reflux disease with esophagitis, without bleeding: Secondary | ICD-10-CM

## 2014-12-31 DIAGNOSIS — I10 Essential (primary) hypertension: Secondary | ICD-10-CM

## 2014-12-31 DIAGNOSIS — R7303 Prediabetes: Secondary | ICD-10-CM

## 2014-12-31 DIAGNOSIS — E8881 Metabolic syndrome: Secondary | ICD-10-CM

## 2014-12-31 DIAGNOSIS — E785 Hyperlipidemia, unspecified: Secondary | ICD-10-CM

## 2014-12-31 DIAGNOSIS — Z1211 Encounter for screening for malignant neoplasm of colon: Secondary | ICD-10-CM

## 2014-12-31 DIAGNOSIS — J302 Other seasonal allergic rhinitis: Secondary | ICD-10-CM

## 2014-12-31 DIAGNOSIS — R7309 Other abnormal glucose: Secondary | ICD-10-CM

## 2014-12-31 MED ORDER — RANITIDINE HCL 150 MG PO TABS: 150.0000 mg | ORAL_TABLET | Freq: Two times a day (BID) | ORAL | Status: DC

## 2014-12-31 MED ORDER — LOVASTATIN 20 MG PO TABS: 20.0000 mg | ORAL_TABLET | Freq: Every day | ORAL | Status: DC

## 2014-12-31 MED ORDER — PHENTERMINE HCL 37.5 MG PO TABS: 37.5000 mg | ORAL_TABLET | Freq: Every day | ORAL | Status: DC

## 2014-12-31 NOTE — Patient Instructions (Signed)
F/u in 4 month, call if you need me before  Please commit to taking lovastatin EVERY DAY  Congrats some improvement , keep it up  Please reconsider the need to stop smoking    Fasting lipid, cmp and eGFr and HBa1C in 4 months  You are referred for colonoscopy please get this done  HAPPY B/DAY!

## 2015-01-01 ENCOUNTER — Encounter (INDEPENDENT_AMBULATORY_CARE_PROVIDER_SITE_OTHER): Payer: Self-pay | Admitting: *Deleted

## 2015-03-29 NOTE — Assessment & Plan Note (Signed)
Improved Patient re-educated about  the importance of commitment to a  minimum of 150 minutes of exercise per week.  The importance of healthy food choices with portion control discussed. Encouraged to start a food diary, count calories and to consider  joining a support group. Sample diet sheets offered. Goals set by the patient for the next several months.   Weight /BMI 12/31/2014 09/30/2014 08/01/2014  WEIGHT 245 lb 1.3 oz 248 lb 250 lb  HEIGHT 5\' 6"  5\' 6"  5\' 6"   BMI 39.58 kg/m2 40.05 kg/m2 40.37 kg/m2    Current exercise per week 120 minutes.

## 2015-03-29 NOTE — Assessment & Plan Note (Signed)
Hyperlipidemia:Low fat diet discussed and encouraged.   Lipid Panel  Lab Results  Component Value Date   CHOL 187 12/27/2014   HDL 50 12/27/2014   LDLCALC 121* 12/27/2014   TRIG 78 12/27/2014   CHOLHDL 3.7 12/27/2014  uncontrolled, not compliant with medication , needs to commit to medication as well as dietary change, inc risk of CAD

## 2015-03-29 NOTE — Assessment & Plan Note (Signed)
Reports adequate response to zantac , continue same

## 2015-03-29 NOTE — Assessment & Plan Note (Signed)
Controlled, no change in medication DASH diet and commitment to daily physical activity for a minimum of 30 minutes discussed and encouraged, as a part of hypertension management. The importance of attaining a healthy weight is also discussed.  BP/Weight 12/31/2014 09/30/2014 08/01/2014 07/29/2014 05/22/2014 07/03/2013 09/03/4994  Systolic BP 924 932 419 914 445 848 350  Diastolic BP 80 82 70 70 82 72 50  Wt. (Lbs) 245.08 248 250 253.5 243.12 243.12 241.6  BMI 39.58 40.05 40.37 40.94 39.26 39.26 39.01

## 2015-03-29 NOTE — Assessment & Plan Note (Signed)
Patient educated about the importance of limiting  Carbohydrate intake , the need to commit to daily physical activity for a minimum of 30 minutes , and to commit weight loss. The fact that changes in all these areas will reduce or eliminate all together the development of diabetes is stressed.  Unchanged, continue metformin as before  Diabetic Labs Latest Ref Rng 12/27/2014 07/10/2014 07/03/2013 11/20/2012 08/05/2011  HbA1c <5.7 % 6.0(H) 6.0(H) 6.2(H) 5.8(H) 6.0(H)  Chol 0 - 200 mg/dL 187 210(H) 168 171 -  HDL >=46 mg/dL 50 68 47 49 -  Calc LDL 0 - 99 mg/dL 121(H) 126(H) 105(H) 105(H) -  Triglycerides <150 mg/dL 78 78 81 85 -  Creatinine 0.50 - 1.10 mg/dL 0.80 0.81 0.90 0.73 -   BP/Weight 12/31/2014 09/30/2014 08/01/2014 07/29/2014 05/22/2014 07/03/2013 43/88/8757  Systolic BP 972 820 601 561 537 943 276  Diastolic BP 80 82 70 70 82 72 50  Wt. (Lbs) 245.08 248 250 253.5 243.12 243.12 241.6  BMI 39.58 40.05 40.37 40.94 39.26 39.26 39.01   No flowsheet data found.

## 2015-03-29 NOTE — Assessment & Plan Note (Signed)
Flare anticipated end Summer into Fall, medication prescribed

## 2015-03-29 NOTE — Assessment & Plan Note (Signed)
The increased risk of cardiovascular disease associated with this diagnosis, and the need to consistently work on lifestyle to change this is discussed. Following  a  heart healthy diet ,commitment to 30 minutes of exercise at least 5 days per week, as well as control of blood sugar and cholesterol , and achieving a healthy weight are all the areas to be addressed .  

## 2015-03-29 NOTE — Progress Notes (Signed)
Julie Ryan     MRN: [id number]      DOB: 01/22/1966   HPI Julie Ryan is here for follow up and re-evaluation of chronic medical conditions, medication management and review of any available recent lab and radiology data.  Preventive health is updated, specifically  Cancer screening and Immunization.   Questions or concerns regarding consultations or procedures which the PT has had in the interim are  addressed. The PT denies any adverse reactions to current medications since the last visit.  There are no new concerns.  There are no specific complaints   ROS Denies recent fever or chills. Denies sinus pressure, nasal congestion, ear pain or sore throat. Denies chest congestion, productive cough or wheezing. Denies chest pains, palpitations and leg swelling Denies abdominal pain, nausea, vomiting,diarrhea or constipation.   Denies dysuria, frequency, hesitancy or incontinence. Denies joint pain, swelling and limitation in mobility. Denies headaches, seizures, numbness, or tingling. Denies depression, anxiety or insomnia. Denies skin break down or rash.   PE  BP 126/80 mmHg  Pulse 76  Resp 18  Ht 5\' 6"  (1.676 m)  Wt 245 lb 1.3 oz (111.168 kg)  BMI 39.58 kg/m2  SpO2 95%  Patient alert and oriented and in no cardiopulmonary distress.  HEENT: No facial asymmetry, EOMI,   oropharynx pink and moist.  Neck supple no JVD, no mass.  Chest: Clear to auscultation bilaterally.  CVS: S1, S2 no murmurs, no S3.Regular rate.  ABD: Soft non tender.   Ext: No edema  MS: Adequate ROM spine, shoulders, hips and knees.  Skin: Intact, no ulcerations or rash noted.  Psych: Good eye contact, normal affect. Memory intact not anxious or depressed appearing.  CNS: CN 2-12 intact, power,  normal throughout.no focal deficits noted.   Assessment & Plan   Essential hypertension Controlled, no change in medication DASH diet and commitment to daily physical activity for a minimum of 30  minutes discussed and encouraged, as a part of hypertension management. The importance of attaining a healthy weight is also discussed.  BP/Weight 12/31/2014 09/30/2014 08/01/2014 07/29/2014 05/22/2014 07/03/2013 83/38/2505  Systolic BP 397 673 419 379 024 097 353  Diastolic BP 80 82 70 70 82 72 50  Wt. (Lbs) 245.08 248 250 253.5 243.12 243.12 241.6  BMI 39.58 40.05 40.37 40.94 39.26 39.26 39.01        Prediabetes Patient educated about the importance of limiting  Carbohydrate intake , the need to commit to daily physical activity for a minimum of 30 minutes , and to commit weight loss. The fact that changes in all these areas will reduce or eliminate all together the development of diabetes is stressed.  Unchanged, continue metformin as before  Diabetic Labs Latest Ref Rng 12/27/2014 07/10/2014 07/03/2013 11/20/2012 08/05/2011  HbA1c <5.7 % 6.0(H) 6.0(H) 6.2(H) 5.8(H) 6.0(H)  Chol 0 - 200 mg/dL 187 210(H) 168 171 -  HDL >=46 mg/dL 50 68 47 49 -  Calc LDL 0 - 99 mg/dL 121(H) 126(H) 105(H) 105(H) -  Triglycerides <150 mg/dL 78 78 81 85 -  Creatinine 0.50 - 1.10 mg/dL 0.80 0.81 0.90 0.73 -   BP/Weight 12/31/2014 09/30/2014 08/01/2014 07/29/2014 05/22/2014 07/03/2013 29/92/4268  Systolic BP 341 962 229 798 921 194 174  Diastolic BP 80 82 70 70 82 72 50  Wt. (Lbs) 245.08 248 250 253.5 243.12 243.12 241.6  BMI 39.58 40.05 40.37 40.94 39.26 39.26 39.01   No flowsheet data found.     MORBID OBESITY Improved Patient  re-educated about  the importance of commitment to a  minimum of 150 minutes of exercise per week.  The importance of healthy food choices with portion control discussed. Encouraged to start a food diary, count calories and to consider  joining a support group. Sample diet sheets offered. Goals set by the patient for the next several months.   Weight /BMI 12/31/2014 09/30/2014 08/01/2014  WEIGHT 245 lb 1.3 oz 248 lb 250 lb  HEIGHT 5\' 6"  5\' 6"  5\' 6"   BMI 39.58 kg/m2 40.05 kg/m2  40.37 kg/m2    Current exercise per week 120 minutes.   Metabolic syndrome X The increased risk of cardiovascular disease associated with this diagnosis, and the need to consistently work on lifestyle to change this is discussed. Following  a  heart healthy diet ,commitment to 30 minutes of exercise at least 5 days per week, as well as control of blood sugar and cholesterol , and achieving a healthy weight are all the areas to be addressed .   Hyperlipidemia Hyperlipidemia:Low fat diet discussed and encouraged.   Lipid Panel  Lab Results  Component Value Date   CHOL 187 12/27/2014   HDL 50 12/27/2014   LDLCALC 121* 12/27/2014   TRIG 78 12/27/2014   CHOLHDL 3.7 12/27/2014  uncontrolled, not compliant with medication , needs to commit to medication as well as dietary change, inc risk of CAD      Allergic rhinitis Flare anticipated end Summer into Fall, medication prescribed  GERD (gastroesophageal reflux disease) Reports adequate response to zantac , continue same

## 2015-04-02 ENCOUNTER — Encounter (INDEPENDENT_AMBULATORY_CARE_PROVIDER_SITE_OTHER): Payer: Self-pay | Admitting: *Deleted

## 2015-04-30 ENCOUNTER — Ambulatory Visit (INDEPENDENT_AMBULATORY_CARE_PROVIDER_SITE_OTHER): Payer: Self-pay | Admitting: Family Medicine

## 2015-04-30 ENCOUNTER — Encounter: Payer: Self-pay | Admitting: Family Medicine

## 2015-04-30 VITALS — BP 144/80 | HR 86 | Resp 18 | Ht 66.0 in | Wt 248.0 lb

## 2015-04-30 DIAGNOSIS — E785 Hyperlipidemia, unspecified: Secondary | ICD-10-CM

## 2015-04-30 DIAGNOSIS — Z1159 Encounter for screening for other viral diseases: Secondary | ICD-10-CM

## 2015-04-30 DIAGNOSIS — G5601 Carpal tunnel syndrome, right upper limb: Secondary | ICD-10-CM

## 2015-04-30 DIAGNOSIS — M79641 Pain in right hand: Secondary | ICD-10-CM

## 2015-04-30 DIAGNOSIS — I10 Essential (primary) hypertension: Secondary | ICD-10-CM

## 2015-04-30 DIAGNOSIS — J3089 Other allergic rhinitis: Secondary | ICD-10-CM

## 2015-04-30 DIAGNOSIS — R7303 Prediabetes: Secondary | ICD-10-CM

## 2015-04-30 MED ORDER — IBUPROFEN 800 MG PO TABS: 800.0000 mg | ORAL_TABLET | Freq: Three times a day (TID) | ORAL | Status: DC | PRN

## 2015-04-30 NOTE — Patient Instructions (Addendum)
F/u in 3 months, call for flu vaccine in December  You are referred to Dr Merlene Laughter re right hand pain x 1 month for December appr   Congrats on smoking cessation Fasting labs Jan 7 or after  Please work on good  health habits so that your health will improve. 1. Commitment to daily physical activity for 30 to 60  minutes, if you are able to do this.  2. Commitment to wise food choices. Aim for half of your  food intake to be vegetable and fruit, one quarter starchy foods, and one quarter protein. Try to eat on a regular schedule  3 meals per day, snacking between meals should be limited to vegetables or fruits or small portions of nuts. 64 ounces of water per day is generally recommended, unless you have specific health conditions, like heart failure or kidney failure where you will need to limit fluid intake.  3. Commitment to sufficient and a  good quality of physical and mental rest daily, generally between 6 to 8 hours per day.  WITH PERSISTANCE AND PERSEVERANCE, THE IMPOSSIBLE , BECOMES THE NORM!  NO changes in meds at this time except no more phentermine

## 2015-04-30 NOTE — Progress Notes (Signed)
Subjective:    Patient ID: Julie Ryan, female    DOB: 1966/01/18, 49 y.o.   MRN: [id number]  HPI   [name]     MRN: [id number]      DOB: 09-Sep-1965   HPI [name] is here for follow up and re-evaluation of chronic medical conditions, medication management and review of any available recent lab and radiology data.  Preventive health is updated, specifically  Cancer screening and Immunization.   Questions or concerns regarding consultations or procedures which the PT has had in the interim are  addressed. The PT denies any adverse reactions to current medications since the last visit.  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 Denies sinus pressure, nasal congestion, ear pain or sore throat. Denies chest congestion, productive cough or wheezing. Denies chest pains, palpitations and leg swelling Denies abdominal pain, nausea, vomiting,diarrhea or constipation.   Denies dysuria, frequency, hesitancy or incontinence.  Denies headaches, seizures, numbness, or tingling. Denies depression, anxiety or insomnia. Denies skin break down or rash.   PE  BP 144/80 mmHg  Pulse 86  Resp 18  Ht 5\' 6"  (1.676 m)  Wt 248 lb (112.492 kg)  BMI 40.05 kg/m2  SpO2 95%  Patient alert and oriented and in no cardiopulmonary distress.  HEENT: No facial asymmetry, EOMI,   oropharynx pink and moist.  Neck supple no JVD, no mass.  Chest: Clear to auscultation bilaterally.  CVS: S1, S2 no murmurs, no S3.Regular rate.  ABD: Soft non tender.   Ext: No edema  MS: Adequate ROM spine, shoulders, hips and knees.Negative exam for carpal tunnel , negative tinnel, no thenar wasting  Skin: Intact, no ulcerations or rash noted.  Psych: Good eye contact, normal affect. Memory intact not anxious or depressed appearing.  CNS: CN 2-12 intact, power,  normal throughout.no  focal deficits noted.   Assessment & Plan   Hand pain, right 1 month history, symptoms c/w carpal tunnel;, though exam less convincing Start gabapentin and refer to neurology for further eval  Essential hypertension Controlled, no change in medication DASH diet and commitment to daily physical activity for a minimum of 30 minutes discussed and encouraged, as a part of hypertension management. The importance of attaining a healthy weight is also discussed.  BP/Weight 04/30/2015 12/31/2014 09/30/2014 08/01/2014 07/29/2014 05/22/2014 15/40/0867  Systolic BP 619 509 326 712 458 099 833  Diastolic BP 80 80 82 70 70 82 72  Wt. (Lbs) 248 245.08 248 250 253.5 243.12 243.12  BMI 40.05 39.58 40.05 40.37 40.94 39.26 39.26        Allergic rhinitis Controlled, no change in medication   Prediabetes Patient educated about the importance of limiting  Carbohydrate intake , the need to commit to daily physical activity for a minimum of 30 minutes , and to commit weight loss. The fact that changes in all these areas will reduce or eliminate all together the development of diabetes is stressed.  Updated lab needed at/ before next visit.  Diabetic Labs Latest Ref Rng 12/27/2014 07/10/2014 07/03/2013 11/20/2012 08/05/2011  HbA1c <5.7 % 6.0(H) 6.0(H) 6.2(H) 5.8(H) 6.0(H)  Chol 0 - 200 mg/dL 187 210(H) 168 171 -  HDL >=46 mg/dL 50 68 47 49 -  Calc LDL 0 - 99 mg/dL 121(H) 126(H) 105(H) 105(H) -  Triglycerides <150 mg/dL 78 78 81 85 -  Creatinine 0.50 - 1.10 mg/dL 0.80 0.81 0.90 0.73 -   BP/Weight 04/30/2015 12/31/2014 09/30/2014 08/01/2014 07/29/2014 05/22/2014 76/19/5093  Systolic BP 267 124 580 998 338 250 539  Diastolic BP 80 80 82 70 70 82 72  Wt. (Lbs) 248 245.08 248 250 253.5 243.12 243.12  BMI 40.05 39.58 40.05 40.37 40.94 39.26 39.26   No flowsheet data found.     Hyperlipidemia Hyperlipidemia:Low fat diet discussed and encouraged. Updated lab needed at/ before next visit.    Lipid  Panel  Lab Results  Component Value Date   CHOL 187 12/27/2014   HDL 50 12/27/2014   LDLCALC 121* 12/27/2014   TRIG 78 12/27/2014   CHOLHDL 3.7 12/27/2014             Review of Systems     Objective:   Physical Exam        Assessment & Plan:

## 2015-05-01 ENCOUNTER — Telehealth: Payer: Self-pay

## 2015-05-01 DIAGNOSIS — M79641 Pain in right hand: Secondary | ICD-10-CM | POA: Insufficient documentation

## 2015-05-01 MED ORDER — GABAPENTIN 300 MG PO CAPS
300.0000 mg | ORAL_CAPSULE | Freq: Every day | ORAL | Status: DC
Start: 2015-05-01 — End: 2015-06-10

## 2015-05-01 NOTE — Telephone Encounter (Signed)
Med sent to [facility], tell her I apologize for the delay please

## 2015-05-01 NOTE — Assessment & Plan Note (Signed)
1 month history, symptoms c/w carpal tunnel;, though exam less convincing Start gabapentin and refer to neurology for further eval

## 2015-05-01 NOTE — Telephone Encounter (Signed)
Patient aware.

## 2015-05-18 NOTE — Assessment & Plan Note (Signed)
Hyperlipidemia:Low fat diet discussed and encouraged. Updated lab needed at/ before next visit.    Lipid Panel  Lab Results  Component Value Date   CHOL 187 12/27/2014   HDL 50 12/27/2014   LDLCALC 121* 12/27/2014   TRIG 78 12/27/2014   CHOLHDL 3.7 12/27/2014

## 2015-05-18 NOTE — Assessment & Plan Note (Signed)
Patient educated about the importance of limiting  Carbohydrate intake , the need to commit to daily physical activity for a minimum of 30 minutes , and to commit weight loss. The fact that changes in all these areas will reduce or eliminate all together the development of diabetes is stressed.  Updated lab needed at/ before next visit.  Diabetic Labs Latest Ref Rng 12/27/2014 07/10/2014 07/03/2013 11/20/2012 08/05/2011  HbA1c <5.7 % 6.0(H) 6.0(H) 6.2(H) 5.8(H) 6.0(H)  Chol 0 - 200 mg/dL 187 210(H) 168 171 -  HDL >=46 mg/dL 50 68 47 49 -  Calc LDL 0 - 99 mg/dL 121(H) 126(H) 105(H) 105(H) -  Triglycerides <150 mg/dL 78 78 81 85 -  Creatinine 0.50 - 1.10 mg/dL 0.80 0.81 0.90 0.73 -   BP/Weight 04/30/2015 12/31/2014 09/30/2014 08/01/2014 07/29/2014 05/22/2014 0000000  Systolic BP 123456 [id number] 123456 123456 [id number] 123456  Diastolic BP 80 80 82 70 70 82 72  Wt. (Lbs) 248 245.08 248 250 253.5 243.12 243.12  BMI 40.05 39.58 40.05 40.37 40.94 39.26 39.26   No flowsheet data found.

## 2015-05-18 NOTE — Assessment & Plan Note (Signed)
Controlled, no change in medication  

## 2015-05-18 NOTE — Assessment & Plan Note (Signed)
Controlled, no change in medication DASH diet and commitment to daily physical activity for a minimum of 30 minutes discussed and encouraged, as a part of hypertension management. The importance of attaining a healthy weight is also discussed.  BP/Weight 04/30/2015 12/31/2014 09/30/2014 08/01/2014 07/29/2014 05/22/2014 0000000  Systolic BP 123456 [id number] 123456 123456 [id number] 123456  Diastolic BP 80 80 82 70 70 82 72  Wt. (Lbs) 248 245.08 248 250 253.5 243.12 243.12  BMI 40.05 39.58 40.05 40.37 40.94 39.26 39.26

## 2015-05-23 ENCOUNTER — Telehealth: Payer: Self-pay | Admitting: Family Medicine

## 2015-05-23 NOTE — Telephone Encounter (Signed)
Patient complaining of runny nose, cough, scratchy throat, eyes watering, denies fever, symptoms x's 2 days please advise

## 2015-05-26 NOTE — Telephone Encounter (Signed)
Called and left message if still having symptoms to call back and I would recommend some otc meds

## 2015-06-04 ENCOUNTER — Other Ambulatory Visit: Payer: Self-pay | Admitting: Family Medicine

## 2015-06-10 ENCOUNTER — Other Ambulatory Visit: Payer: Self-pay

## 2015-06-10 ENCOUNTER — Other Ambulatory Visit: Payer: Self-pay | Admitting: Family Medicine

## 2015-06-10 DIAGNOSIS — J309 Allergic rhinitis, unspecified: Secondary | ICD-10-CM

## 2015-06-10 DIAGNOSIS — M79641 Pain in right hand: Secondary | ICD-10-CM

## 2015-06-10 MED ORDER — FUROSEMIDE 40 MG PO TABS: 40.0000 mg | ORAL_TABLET | Freq: Every day | ORAL | Status: DC

## 2015-06-10 MED ORDER — IBUPROFEN 800 MG PO TABS: 800.0000 mg | ORAL_TABLET | Freq: Three times a day (TID) | ORAL | Status: DC | PRN

## 2015-06-10 MED ORDER — FLUTICASONE PROPIONATE 50 MCG/ACT NA SUSP: 1.0000 | Freq: Every day | NASAL | Status: DC

## 2015-06-10 MED ORDER — GABAPENTIN 300 MG PO CAPS: 300.0000 mg | ORAL_CAPSULE | Freq: Every day | ORAL | Status: DC

## 2015-06-10 MED ORDER — METOPROLOL TARTRATE 25 MG PO TABS: ORAL_TABLET | ORAL | Status: DC

## 2015-06-10 MED ORDER — METFORMIN HCL ER 500 MG PO TB24: 500.0000 mg | ORAL_TABLET | Freq: Every day | ORAL | Status: DC

## 2015-06-10 MED ORDER — RANITIDINE HCL 150 MG PO TABS: 150.0000 mg | ORAL_TABLET | Freq: Two times a day (BID) | ORAL | Status: DC

## 2015-06-18 ENCOUNTER — Emergency Department (HOSPITAL_COMMUNITY)
Admission: EM | Admit: 2015-06-18 | Discharge: 2015-06-18 | Disposition: A | Discharge status: Home / Self Care | Payer: BLUE CROSS/BLUE SHIELD | Attending: Emergency Medicine | Admitting: Emergency Medicine

## 2015-06-18 ENCOUNTER — Emergency Department (HOSPITAL_COMMUNITY): Payer: BLUE CROSS/BLUE SHIELD

## 2015-06-18 ENCOUNTER — Encounter (HOSPITAL_COMMUNITY): Payer: Self-pay | Admitting: Emergency Medicine

## 2015-06-18 DIAGNOSIS — Z791 Long term (current) use of non-steroidal anti-inflammatories (NSAID): Secondary | ICD-10-CM | POA: Diagnosis not present

## 2015-06-18 DIAGNOSIS — Z7951 Long term (current) use of inhaled steroids: Secondary | ICD-10-CM | POA: Diagnosis not present

## 2015-06-18 DIAGNOSIS — R202 Paresthesia of skin: Secondary | ICD-10-CM | POA: Insufficient documentation

## 2015-06-18 DIAGNOSIS — E669 Obesity, unspecified: Secondary | ICD-10-CM | POA: Diagnosis not present

## 2015-06-18 DIAGNOSIS — I10 Essential (primary) hypertension: Secondary | ICD-10-CM | POA: Insufficient documentation

## 2015-06-18 DIAGNOSIS — Z87891 Personal history of nicotine dependence: Secondary | ICD-10-CM | POA: Insufficient documentation

## 2015-06-18 DIAGNOSIS — Z8669 Personal history of other diseases of the nervous system and sense organs: Secondary | ICD-10-CM | POA: Insufficient documentation

## 2015-06-18 DIAGNOSIS — Z91040 Latex allergy status: Secondary | ICD-10-CM | POA: Insufficient documentation

## 2015-06-18 DIAGNOSIS — Z7984 Long term (current) use of oral hypoglycemic drugs: Secondary | ICD-10-CM | POA: Diagnosis not present

## 2015-06-18 DIAGNOSIS — Z79899 Other long term (current) drug therapy: Secondary | ICD-10-CM | POA: Insufficient documentation

## 2015-06-18 DIAGNOSIS — F419 Anxiety disorder, unspecified: Secondary | ICD-10-CM | POA: Insufficient documentation

## 2015-06-18 DIAGNOSIS — M25531 Pain in right wrist: Secondary | ICD-10-CM | POA: Insufficient documentation

## 2015-06-18 DIAGNOSIS — G8929 Other chronic pain: Secondary | ICD-10-CM | POA: Diagnosis not present

## 2015-06-18 DIAGNOSIS — F329 Major depressive disorder, single episode, unspecified: Secondary | ICD-10-CM | POA: Diagnosis not present

## 2015-06-18 DIAGNOSIS — R200 Anesthesia of skin: Secondary | ICD-10-CM | POA: Diagnosis present

## 2015-06-18 HISTORY — DX: Other chronic pain: G89.29

## 2015-06-18 HISTORY — DX: Pain in unspecified knee: M25.569

## 2015-06-18 HISTORY — DX: Carpal tunnel syndrome, right upper limb: G56.01

## 2015-06-18 MED ORDER — TRAMADOL HCL 50 MG PO TABS: 50.0000 mg | ORAL_TABLET | Freq: Four times a day (QID) | ORAL | Status: DC | PRN

## 2015-06-18 MED ORDER — NAPROXEN 250 MG PO TABS: 250.0000 mg | ORAL_TABLET | Freq: Two times a day (BID) | ORAL | Status: DC

## 2015-06-18 MED ORDER — NAPROXEN 250 MG PO TABS
250.0000 mg | ORAL_TABLET | Freq: Once | ORAL | Status: AC
  Administered 2015-06-18: 250 mg via ORAL
  Filled 2015-06-18: qty 1

## 2015-06-18 NOTE — ED Provider Notes (Signed)
CSN: [id number]     Arrival date & time 06/18/15  1546 History   First MD Initiated Contact with Patient 06/18/15 1608     Chief Complaint  Patient presents with  . Numbness    x3 months      HPI Pt was seen at 1610. Per pt, c/o gradual onset and persistence of constant right wrist "pain" and thumb, index and middle fingers "numbness" for the past 3 months. States the discomfort will intermittently radiate up into her right forearm. Pt has been evaluated by her PMD and Neuro MD for same, dx carpal tunnel syndrome, rx Neurontin. States she has an EMG scheduled 07/02/15. Pt states she has been taking Neurontin without improvement. Symptoms began shortly after starting a new job where she performs repetitive movements with her right hand/wrist (pt is right handed). Denies fevers, no direct injury, no rash, no neck pain, no CP, no focal motor weakness.    Past Medical History  Diagnosis Date  . Anxiety   . Depression   . Obesity   . Hypertension   . Prediabetes   . Carpal tunnel syndrome of right wrist   . Chronic knee pain    Past Surgical History  Procedure Laterality Date  . Tubal ligation    . Appendectomy    . Esophagogastroduodenoscopy N/A 05/25/2013    Procedure: ESOPHAGOGASTRODUODENOSCOPY (EGD);  Surgeon: Rogene Houston, MD;  Location: [facility];  Service: Endoscopy;  Laterality: N/A;  33   Family History  Problem Relation Age of Onset  . Hypertension Mother   . Heart disease Mother   . Diabetes Mother   . Diabetes Sister   . Multiple sclerosis Sister   . Lupus Sister   . Heart disease Sister   . CVA Sister   . Diabetes Brother   . Hypertension Brother   . Hypertension Daughter    Social History  Substance Use Topics  . Smoking status: Former Smoker -- 0.25 packs/day    Types: Cigarettes    Quit date: 03/31/2015  . Smokeless tobacco: Never Used     Comment: smokes occasionally   . Alcohol Use: No    Review of Systems ROS: Statement: All systems  negative except as marked or noted in the HPI; Constitutional: Negative for fever and chills. ; ; Eyes: Negative for eye pain, redness and discharge. ; ; ENMT: Negative for ear pain, hoarseness, nasal congestion, sinus pressure and sore throat. ; ; Cardiovascular: Negative for chest pain, palpitations, diaphoresis, dyspnea and peripheral edema. ; ; Respiratory: Negative for cough, wheezing and stridor. ; ; Gastrointestinal: Negative for nausea, vomiting, diarrhea, abdominal pain, blood in stool, hematemesis, jaundice and rectal bleeding. . ; ; Genitourinary: Negative for dysuria, flank pain and hematuria. ; ; Musculoskeletal: +pain right wrist, paresthesias right fingers. Negative for back pain and neck pain. Negative for swelling and trauma.; ; Skin: Negative for pruritus, rash, abrasions, blisters, bruising and skin lesion.; ; Neuro: Negative for headache, lightheadedness and neck stiffness. Negative for weakness, altered level of consciousness , altered mental status, extremity weakness, involuntary movement, seizure and syncope.      Allergies  Codeine; Effexor xr; Fluoxetine; Hydrocodone; and Latex  Home Medications   Prior to Admission medications   Medication Sig Start Date End Date Taking? Authorizing Provider  fluticasone (FLONASE) 50 MCG/ACT nasal spray Place 1 spray into both nostrils daily. 06/10/15 06/09/16  Fayrene Helper, MD  furosemide (LASIX) 40 MG tablet Take 1 tablet (40 mg total) by mouth daily.  06/10/15   Fayrene Helper, MD  gabapentin (NEURONTIN) 300 MG capsule Take 1 capsule (300 mg total) by mouth at bedtime. 06/10/15   Fayrene Helper, MD  ibuprofen (ADVIL,MOTRIN) 800 MG tablet Take 1 tablet (800 mg total) by mouth every 8 (eight) hours as needed. 06/10/15   Fayrene Helper, MD  loratadine (CLARITIN) 10 MG tablet Take 1 tablet (10 mg total) by mouth daily. 10/11/14   Fayrene Helper, MD  lovastatin (MEVACOR) 20 MG tablet Take 1 tablet (20 mg total) by mouth at  bedtime. 12/31/14   Fayrene Helper, MD  metFORMIN (GLUCOPHAGE-XR) 500 MG 24 hr tablet Take 1 tablet (500 mg total) by mouth daily with breakfast. 06/10/15   Fayrene Helper, MD  metoprolol tartrate (LOPRESSOR) 25 MG tablet TAKE 1 TABLET BY MOUTH TWICE A DAY FOR BLOOD PRESSURE. 06/10/15   Fayrene Helper, MD  ranitidine (ZANTAC) 150 MG tablet Take 1 tablet (150 mg total) by mouth 2 (two) times daily. 06/10/15   Fayrene Helper, MD   BP 143/61 mmHg  Pulse 72  Temp(Src) 98.1 F (36.7 C) (Oral)  Resp 16  Ht 5\' 6"  (1.676 m)  Wt 248 lb (112.492 kg)  BMI 40.05 kg/m2  SpO2 100%  LMP 05/31/2015 Physical Exam  1615: Physical examination:  Nursing notes reviewed; Vital signs and O2 SAT reviewed;  Constitutional: Well developed, Well nourished, Well hydrated, In no acute distress; Head:  Normocephalic, atraumatic; Eyes: EOMI, PERRL, No scleral icterus; ENMT: Mouth and pharynx normal, Mucous membranes moist; Neck: Supple, Full range of motion, No lymphadenopathy; Cardiovascular: Regular rate and rhythm, No murmur, rub, or gallop; Respiratory: Breath sounds clear & equal bilaterally, No rales, rhonchi, wheezes.  Speaking full sentences with ease, Normal respiratory effort/excursion; Chest: Nontender, Movement normal; Abdomen: Soft, Nontender, Nondistended, Normal bowel sounds; Genitourinary: No CVA tenderness; Spine:  No midline CS, TS, LS tenderness.;; Extremities: Pulses normal. NT right shoulder/elbow/wrist/hand. No right snuffbox tenderness.  No pain to axial thumb or 3rd MCP loading.  Right forearm compartments soft, strong radial pp, brisk cap refill in fingers. Right hand NMS intact. +Tinel's sign right wrist. No rash, no deformity, no edema, no erythema, no ecchymosis; Neuro: AA&Ox3, Major CN grossly intact.  Speech clear. No gross focal motor deficits in extremities. Climbs on and off stretcher easily by herself. Gait steady.; Skin: Color normal, Warm, Dry.   ED Course  Procedures (including  critical care time) Labs Review   Imaging Review  I have personally reviewed and evaluated these images and lab results as part of my medical decision-making.   EKG Interpretation None      MDM  MDM Reviewed: previous chart, nursing note and vitals Interpretation: x-ray    Dg Wrist Complete Right 06/18/2015  CLINICAL DATA:  Wrist pain and swelling. EXAM: RIGHT WRIST - COMPLETE 3+ VIEW COMPARISON:  None. FINDINGS: There is no evidence of fracture or dislocation. There is no evidence of arthropathy or other focal bone abnormality. Soft tissues are unremarkable. IMPRESSION: Negative right wrist radiographs. Electronically Signed   By: San Morelle M.D.   On: 06/18/2015 16:44    1650:  XR reassuring. Tx symptomatically with wrist splint, NSAID, f/u Neuro MD. Dx and testing d/w pt and family.  Questions answered.  Verb understanding, agreeable to d/c home with outpt f/u.   Francine Graven, DO 06/21/15 [phone]

## 2015-06-18 NOTE — ED Notes (Signed)
Pt states her right hand has been tingling/numb x 2 months. Seen by neurologist and has nerve study scheduled 28th of this month, but numbness/tingling/burning sensation is now going up right arm.

## 2015-06-18 NOTE — Discharge Instructions (Signed)
°Emergency Department Resource Guide °1) Find a Doctor and Pay Out of Pocket °Although you won't have to find out who is covered by your insurance plan, it is a good idea to ask around and get recommendations. You will then need to call the office and see if the doctor you have chosen will accept you as a new patient and what types of options they offer for patients who are self-pay. Some doctors offer discounts or will set up payment plans for their patients who do not have insurance, but you will need to ask so you aren't surprised when you get to your appointment. ° °2) Contact Your Local Health Department °Not all health departments have doctors that can see patients for sick visits, but many do, so it is worth a call to see if yours does. If you don't know where your local health department is, you can check in your phone book. The CDC also has a tool to help you locate your state's health department, and many state websites also have listings of all of their local health departments. ° °3) Find a Walk-in Clinic °If your illness is not likely to be very severe or complicated, you may want to try a walk in clinic. These are popping up all over the country in pharmacies, drugstores, and shopping centers. They're usually staffed by nurse practitioners or physician assistants that have been trained to treat common illnesses and complaints. They're usually fairly quick and inexpensive. However, if you have serious medical issues or chronic medical problems, these are probably not your best option. ° °No Primary Care Doctor: °- Call [facility] at  832-8000 - they can help you locate a primary care doctor that  accepts your insurance, provides certain services, etc. °- Physician Referral Service- 1-800-533-3463 ° °Chronic Pain Problems: °Organization         Address  Phone   Notes  °[facility]  (336) 297-2271 Patients need to be referred by their primary care doctor.  ° °Medication  Assistance: °Organization         Address  Phone   Notes  °[place] [facility] [address] °[place], [place] [address] (336) 641-8030 --Must be a resident of [place] °-- Must have NO insurance coverage whatsoever (no Medicaid/ Medicare, etc.) °-- The pt. MUST have a primary care doctor that directs their care regularly and follows them in the community °  °[facility]  (866) 331-1348   °[employer]  (888) 892-1162   ° °Agencies that provide inexpensive medical care: °Organization         Address  Phone   Notes  °[facility]  (336) 832-8035   °[facility]    (336) 832-7272   °[facility] [address] °[place], [place] [address] (336) 832-4777   °[facility] [address], °[place] (336) 271-4999   °[facility]    (336) 373-0678   °[facility]    (336) 272-1050   °[facility] ° [address], [place] Phone:  (336) 832-4444, Fax:  (336) 832-4440 Hours of Operation:  9 am - 6 pm, M-F.  Also accepts Medicaid/Medicare and self-pay.  °[facility] ° [address], [place] Phone: (336) 832-3150, Fax: (336) 832-3151. Hours of Operation:  8:30 am - 5:30 pm, M-F.  Also accepts Medicaid and self-pay.  °[facility] [place] [address], [place] Phone: (336) 878-6027   °[facility] [address], [place], [place] (336)723-1848, Ext. 123 Mondays & Thursdays: 7-9 AM.  First 15 patients are seen on a first come, first serve basis. °  ° °Medicaid-accepting [place] Providers: ° °Organization         Address  Phone   Notes  °[facility] [address], [place] (336) 641-2100 Also accepts self-pay patients.  °[facility] [address], [place] ° (336) 856-9996   °[facility] [address], [place]  (336) 288-8857   °[facility] [address] [place] [address], [place] (336) 299-7000   °Veita Bland [address], [place]  ° (336) 373-1557 Only accepts  Access Medicaid patients after they have their name applied to their card.  ° °Self-Pay (no insurance) in [place]: ° °Organization         Address  Phone   Notes  °Sickle Cell Patients, [facility] [address], [place] (336) 832-1970   °[facility] [address], [place] (336) 832-4400   °[facility] ° [address], [place] (336) 992-4800   °[facility]/Dr. Osei-Bonsu ° [address] [place] [address], [place] or [address], [place] (336) 841-8500 Phone number for both [place] and [place] locations is the same.  °[facility] [address], [place] (336) 299-0000   °[facility] [address] [place] [address], [place] or [address] (336) 852-7530 °(336) 878-2260   °[facility] [address], [place] (336) 350-1642, phone; (336) 294-5005, fax Sees patients 1st and 3rd Saturday of every month.  Must not qualify for public or private insurance (i.e. Medicaid, Medicare, [place] Health Choice, Veterans' Benefits) • Household income should be no more than 200% of the poverty level •The clinic cannot treat you if you are pregnant or think you are pregnant • Sexually transmitted diseases are not treated at the clinic.  ° ° °Dental Care: °Organization         Address  Phone  Notes  °[place] [facility] [address], [place] (336) 641-6152 Accepts children up to age 21 who are enrolled in Medicaid or [place] Health Choice; pregnant women with a Medicaid card; and children who have applied for Medicaid or [place] Health Choice, but were declined, whose parents can pay a reduced fee at time of service.  °[place]  [facility] [place]  [address], [place] (336) 641-7733 Accepts children up to age 21 who are enrolled in Medicaid or [place] Health Choice; pregnant women with a Medicaid card; and children who have applied for Medicaid or [place] Health Choice, but were declined, whose parents can pay a reduced fee at time of service.  °[facility] ° [address],  (336) 641-4533 Patients are seen by appointment only. Walk-ins are not accepted. [facility] will see patients 18 years of age and older. °Monday - Tuesday (8am-5pm) °Most Wednesdays (8:30-5pm) °$30 per visit, cash only  °[facility] ° [address], [place] (336) 641-4533 Patients are seen by appointment only. Walk-ins are not accepted. [facility] will see patients 18 years of age and older. °One   Wednesday Evening (Monthly: Volunteer Based).  $30 per visit, cash only  °[facility]  (919) 537-3737 for adults; Children under age 4, call Graduate Pediatric Dentistry at (919) [phone]. Children aged 4-14, please call (919) [phone] to request a pediatric application. ° Dental services are provided in all areas of dental care including fillings, crowns and bridges, complete and partial dentures, implants, gum treatment, root canals, and extractions. Preventive care is also provided. Treatment is provided to both adults and children. °Patients are selected via a lottery and there is often a waiting list. °  °[facility] [address], °[place] ° (336) 763-8833 [email or web address].[email or web address] °  °[facility] [address], [place], [place] (336)723-1848, Ext. 123 Second and Fourth Thursday of each month, opens at 6:30 AM; Clinic ends at 9 AM.  Patients are seen on a first-come first-served basis, and a limited number are seen during each clinic.  ° °[facility] ° [address], [place], [place] (336) 723-7904    Eligibility Requirements °You must have lived in [place], or [place] counties for at least the last three months. °  You cannot be eligible for state or federal sponsored healthcare insurance, including Veterans Administration, Medicaid, or Medicare. °  You generally cannot be eligible for healthcare insurance through your employer.  °  How to apply: °Eligibility screenings are held every Tuesday and Wednesday afternoon from 1:00 pm until 4:00 pm. You do not need an appointment for the interview!  °[facility] [address], [place], [place] 336-631-2330   °[facility]  336-342-8273   °[facility]  336-703-3100   °[facility]  336-570-6415   ° °Behavioral Health Resources in the Community: °Intensive Outpatient Programs °Organization         Address  Phone  Notes  °[facility] [address], [place], [place] 336-878-6098   °[facility] [address], [place], [place] 336-832-9800   °[facility] [address], [place], [place] ° 336-882-2125   °[facility] [address],  °[place], [place] 1-800-853-5163 or 336-641-4981   °Substance Abuse Resources °Organization         Address  Phone  Notes  °[facility]  336-882-2125   °[facility]  336-784-9470   °[facility]  336-285-9073   °[facility]  336-845-3988   °Residential & Outpatient Substance Abuse Program  1-800-659-3381   °Psychological Services °Organization         Address  Phone  Notes  °[facility]  336- 832-9600   °[facility]  336- 378-7881   °[facility] [address], [place] 1-800-853-5163 or 336-641-4981   ° °Mobile Crisis Teams °Organization         Address  Phone  Notes  °[facility], Mobile Crisis Care Unit  1-877-626-1772   °Assertive °[facility] ° [address]  [place], [place] 336-834-9664   °Sharon DeEsch [address] °[place] 336-554-5454   ° °Self-Help/Support Groups °Organization         Address  Phone             Notes  °[facility] [place] - variety of support groups  336- 373-1402 Call for more information  °Narcotics Anonymous (NA), [facility] [address], °[place]  2 meetings at this location  ° °  Residential Treatment Programs Organization         Address  Phone  Notes  [facility] [address],    [place]  1-2260443377   [facility]  [address], [place] [id number], [place], [place]   [place], [place] (980)042-8839 Admissions: 8am-3pm M-F  [facility] [place] [address]    [place], [place] [id number]   [facility] [address] [place], [place], [place]   [facility] [address]  [place], [place]   [facility] - Intensive Outpatient [address], [place], [place]   [facility] [address]  [place], [place] 1-3854604360 or 6177111305   [facility] [address] [place], [place] Accepts Medicaid  [facility] [address]  [place] 1-928 619 4193 Substance Abuse/Addiction Treatment   [facility]         Address  Phone  Notes  [facility]  269-533-8924   Domenic Schwab, PhD [address] [place], [place]   3131817313 or [phone]   [facility] [place], [place] 814-600-5014   [facility] [address] [place], [place] (864)085-5530 Insurance/Medicaid/sponsorship through [facility] [address]                                    [place], [place] (902)[phone] [place] [address], [place] [phone]    Dr. Adele Schilder  [phone]   [facility] Dept. 1) [address] [place] 2) [place] 3)  [place] [address], [place] 601-169-4323 [phone]  (601)[phone]   [place] [phone] or 2183213207 (After Hours)      Take the prescriptions as directed.  Apply moist heat or ice to the area(s) of discomfort, for 15 minutes at a time, several times per day for the next few days.  Do not fall asleep on a heating or ice pack.  Call your regular medical doctor tomorrow to schedule a follow up appointment this week. Call your Neurologist tomorrow to schedule a follow up appointment within the next week.  Return to the Emergency Department immediately if worsening.

## 2015-06-18 NOTE — ED Notes (Signed)
MD at bedside. 

## 2015-07-14 ENCOUNTER — Emergency Department (HOSPITAL_COMMUNITY): Payer: BLUE CROSS/BLUE SHIELD

## 2015-07-14 ENCOUNTER — Encounter (HOSPITAL_COMMUNITY): Payer: Self-pay | Admitting: Emergency Medicine

## 2015-07-14 ENCOUNTER — Emergency Department (HOSPITAL_COMMUNITY)
Admission: EM | Admit: 2015-07-14 | Discharge: 2015-07-14 | Disposition: A | Discharge status: Home / Self Care | Payer: BLUE CROSS/BLUE SHIELD | Attending: Emergency Medicine | Admitting: Emergency Medicine

## 2015-07-14 DIAGNOSIS — Z7952 Long term (current) use of systemic steroids: Secondary | ICD-10-CM | POA: Insufficient documentation

## 2015-07-14 DIAGNOSIS — F419 Anxiety disorder, unspecified: Secondary | ICD-10-CM | POA: Insufficient documentation

## 2015-07-14 DIAGNOSIS — I10 Essential (primary) hypertension: Secondary | ICD-10-CM | POA: Insufficient documentation

## 2015-07-14 DIAGNOSIS — F329 Major depressive disorder, single episode, unspecified: Secondary | ICD-10-CM | POA: Insufficient documentation

## 2015-07-14 DIAGNOSIS — Y998 Other external cause status: Secondary | ICD-10-CM | POA: Insufficient documentation

## 2015-07-14 DIAGNOSIS — Y9389 Activity, other specified: Secondary | ICD-10-CM | POA: Insufficient documentation

## 2015-07-14 DIAGNOSIS — S82891A Other fracture of right lower leg, initial encounter for closed fracture: Secondary | ICD-10-CM

## 2015-07-14 DIAGNOSIS — W000XXA Fall on same level due to ice and snow, initial encounter: Secondary | ICD-10-CM | POA: Insufficient documentation

## 2015-07-14 DIAGNOSIS — Y9289 Other specified places as the place of occurrence of the external cause: Secondary | ICD-10-CM | POA: Insufficient documentation

## 2015-07-14 DIAGNOSIS — S8264XA Nondisplaced fracture of lateral malleolus of right fibula, initial encounter for closed fracture: Secondary | ICD-10-CM | POA: Insufficient documentation

## 2015-07-14 DIAGNOSIS — G8929 Other chronic pain: Secondary | ICD-10-CM | POA: Insufficient documentation

## 2015-07-14 DIAGNOSIS — E669 Obesity, unspecified: Secondary | ICD-10-CM | POA: Insufficient documentation

## 2015-07-14 DIAGNOSIS — Z87891 Personal history of nicotine dependence: Secondary | ICD-10-CM | POA: Insufficient documentation

## 2015-07-14 DIAGNOSIS — Z7951 Long term (current) use of inhaled steroids: Secondary | ICD-10-CM | POA: Insufficient documentation

## 2015-07-14 DIAGNOSIS — Z91040 Latex allergy status: Secondary | ICD-10-CM | POA: Insufficient documentation

## 2015-07-14 DIAGNOSIS — Z79899 Other long term (current) drug therapy: Secondary | ICD-10-CM | POA: Insufficient documentation

## 2015-07-14 MED ORDER — IBUPROFEN 800 MG PO TABS: 800.0000 mg | ORAL_TABLET | Freq: Three times a day (TID) | ORAL | Status: DC

## 2015-07-14 MED ORDER — HYDROCODONE-ACETAMINOPHEN 5-325 MG PO TABS
2.0000 | ORAL_TABLET | Freq: Once | ORAL | Status: AC
  Administered 2015-07-14: 2 via ORAL
  Filled 2015-07-14: qty 2

## 2015-07-14 MED ORDER — HYDROCODONE-ACETAMINOPHEN 5-325 MG PO TABS: 1.0000 | ORAL_TABLET | ORAL | Status: DC | PRN

## 2015-07-14 MED ORDER — PROMETHAZINE HCL 25 MG PO TABS: 25.0000 mg | ORAL_TABLET | Freq: Four times a day (QID) | ORAL | Status: DC | PRN

## 2015-07-14 MED ORDER — IBUPROFEN 800 MG PO TABS
800.0000 mg | ORAL_TABLET | Freq: Once | ORAL | Status: AC
  Administered 2015-07-14: 800 mg via ORAL
  Filled 2015-07-14: qty 1

## 2015-07-14 MED ORDER — ONDANSETRON HCL 4 MG PO TABS
4.0000 mg | ORAL_TABLET | Freq: Once | ORAL | Status: AC
  Administered 2015-07-14: 4 mg via ORAL
  Filled 2015-07-14: qty 1

## 2015-07-14 NOTE — ED Provider Notes (Signed)
CSN: [id number]     Arrival date & time 07/14/15  1351 History  By signing my name below, I, [facility], attest that this documentation has been prepared under the direction and in the presence of [employer], PA-C. Electronically Signed: Virgel Bouquet, ED Scribe. 07/14/2015. 2:57 PM.   Chief Complaint  Patient presents with  . Ankle Pain   Patient is a 50 y.o. female presenting with ankle pain. The history is provided by the patient. No language interpreter was used.  Ankle Pain  HPI Comments: Julie Ryan is a 50 y.o. female with an hx of HTN who presents to the Emergency Department complaining of constant, moderate, gradually improving sore right ankle pain after falling on the ice yesterday. Patient reports that she was getting into a vehicle when she slipped and fell onto ice, followed immediately by pain in her right ankle that she describes as soreness in her bone. She endorses associated partially resolved swelling in the right ankle. She has elevated and iced the ankle with slight relief. Patient denies hx of right ankle surgeries. She denies use of anticoagulant medications. Patient denies any other symptoms currently.  Past Medical History  Diagnosis Date  . Anxiety   . Depression   . Obesity   . Hypertension   . Prediabetes   . Carpal tunnel syndrome of right wrist   . Chronic knee pain    Past Surgical History  Procedure Laterality Date  . Tubal ligation    . Appendectomy    . Esophagogastroduodenoscopy N/A 05/25/2013    Procedure: ESOPHAGOGASTRODUODENOSCOPY (EGD);  Surgeon: Rogene Houston, MD;  Location: [facility];  Service: Endoscopy;  Laterality: N/A;  18   Family History  Problem Relation Age of Onset  . Hypertension Mother   . Heart disease Mother   . Diabetes Mother   . Diabetes Sister   . Multiple sclerosis Sister   . Lupus Sister   . Heart disease Sister   . CVA Sister   . Diabetes Brother   . Hypertension Brother   . Hypertension  Daughter    Social History  Substance Use Topics  . Smoking status: Former Smoker -- 0.25 packs/day    Types: Cigarettes    Quit date: 03/31/2015  . Smokeless tobacco: Never Used     Comment: smokes occasionally   . Alcohol Use: No   OB History    No data available     Review of Systems  Musculoskeletal: Positive for joint swelling (Right ankle) and arthralgias (Right ankle).  All other systems reviewed and are negative.     Allergies  Codeine; Effexor xr; Fluoxetine; Hydrocodone; and Latex  Home Medications   Prior to Admission medications   Medication Sig Start Date End Date Taking? Authorizing Provider  furosemide (LASIX) 40 MG tablet Take 1 tablet (40 mg total) by mouth daily. 06/10/15  Yes Fayrene Helper, MD  gabapentin (NEURONTIN) 300 MG capsule Take 1 capsule (300 mg total) by mouth at bedtime. 06/10/15  Yes Fayrene Helper, MD  loratadine (CLARITIN) 10 MG tablet Take 1 tablet (10 mg total) by mouth daily. 10/11/14  Yes Fayrene Helper, MD  lovastatin (MEVACOR) 20 MG tablet Take 1 tablet (20 mg total) by mouth at bedtime. 12/31/14  Yes Fayrene Helper, MD  metFORMIN (GLUCOPHAGE-XR) 500 MG 24 hr tablet Take 1 tablet (500 mg total) by mouth daily with breakfast. 06/10/15  Yes Fayrene Helper, MD  metoprolol tartrate (LOPRESSOR) 25 MG tablet TAKE  1 TABLET BY MOUTH TWICE A DAY FOR BLOOD PRESSURE. 06/10/15  Yes Fayrene Helper, MD  predniSONE (DELTASONE) 20 MG tablet Take 20 mg by mouth daily with breakfast.   Yes Historical Provider, MD  ranitidine (ZANTAC) 150 MG tablet Take 1 tablet (150 mg total) by mouth 2 (two) times daily. 06/10/15  Yes Fayrene Helper, MD  traMADol (ULTRAM) 50 MG tablet Take 1 tablet (50 mg total) by mouth every 6 (six) hours as needed for moderate pain or severe pain. 06/18/15  Yes Francine Graven, DO  fluticasone (FLONASE) 50 MCG/ACT nasal spray Place 1 spray into both nostrils daily. 06/10/15 06/09/16  Fayrene Helper, MD   HYDROcodone-acetaminophen (NORCO/VICODIN) 5-325 MG tablet Take 1 tablet by mouth every 4 (four) hours as needed. 07/14/15   Lily Kocher, PA-C  ibuprofen (ADVIL,MOTRIN) 800 MG tablet Take 1 tablet (800 mg total) by mouth 3 (three) times daily. 07/14/15   Lily Kocher, PA-C  naproxen (NAPROSYN) 250 MG tablet Take 1 tablet (250 mg total) by mouth 2 (two) times daily with a meal. Patient not taking: Reported on 07/14/2015 06/18/15   Francine Graven, DO  promethazine (PHENERGAN) 25 MG tablet Take 1 tablet (25 mg total) by mouth every 6 (six) hours as needed for nausea or vomiting. 07/14/15   Lily Kocher, PA-C   BP 144/62 mmHg  Pulse 54  Temp(Src) 98 F (36.7 C) (Oral)  Resp 16  Ht 5\' 6"  (1.676 m)  Wt 230 lb (104.327 kg)  BMI 37.14 kg/m2  SpO2 100%  LMP 07/14/2015 (Exact Date) Physical Exam  Constitutional: She is oriented to person, place, and time. She appears well-developed and well-nourished. No distress.  HENT:  Head: Normocephalic and atraumatic.  Eyes: Conjunctivae and EOM are normal.  Neck: Neck supple. No tracheal deviation present.  Cardiovascular: Normal rate.   Pulses:      Dorsalis pedis pulses are 2+ on the right side.       Posterior tibial pulses are 2+ on the right side.  Pulmonary/Chest: Effort normal. No respiratory distress.  Musculoskeletal: Normal range of motion.       Right knee: Normal. She exhibits normal range of motion.       Right ankle: Tenderness. Lateral malleolus tenderness found. Achilles tendon normal.       Right lower leg: She exhibits no deformity.  Capillary refill less than 2 seconds. DP and PT pulses 2+. No deformity of the tibial area. FROM of right knee. Tenderness of the lateral malleolus. Achilles tendon is intact.  Neurological: She is alert and oriented to person, place, and time.  Skin: Skin is warm and dry.  Psychiatric: She has a normal mood and affect. Her behavior is normal.  Nursing note and vitals reviewed.   ED Cours    Procedures  FRACTURE CARE RIGHT ANKLE: The patient identified by arm band. The procedure was explained to the patient in terms she understands. Explained the need for ASO and Crutches. Pt fitted with ASO. Pt given instructions for crutches use. Pt tolerated procedure without problem. Rx for norco and ibuprofen given for pain. Pt will use ice and elevation at home.   DIAGNOSTIC STUDIES: Oxygen Saturation is 100% on RA, normal by my interpretation.    COORDINATION OF CARE: 2:24 PM Will apply brace and provide pt with crutches. Will prescribe Norco, ibuprofen, and Phenergan. Advised pt to continue to ice and elevate ankle. Will provide referral to orthopedist and and advised pt to follow-up with orthopedist. Discussed treatment plan  with pt at bedside and pt agreed to plan.  Labs Review Labs Reviewed - No data to display  Imaging Review Dg Ankle Complete Right  07/14/2015  CLINICAL DATA:  Injury, pain and swelling EXAM: RIGHT ANKLE - COMPLETE 3+ VIEW COMPARISON:  None. FINDINGS: Diffuse ankle soft tissue swelling. Small nondisplaced avulsion type fracture of the lateral malleolar tip on the frontal view. Distal tibia, talus, and calcaneus appear intact. No subluxation or dislocation. No significant joint abnormality. IMPRESSION: Small nondisplaced lateral malleolar tip avulsion fracture. Associated diffuse ankle swelling. Electronically Signed   By: Jerilynn Mages.  Shick M.D.   On: 07/14/2015 14:21   I have personally reviewed and evaluated these images and lab results as part of my medical decision-making.   EKG Interpretation None      MDM The xray of the ankle reveals an avulsion fracture of the right ankle. Pt fitted with ASO and given ice pack. Pt to follow up with Dr Aline Brochure as soon as possible.  Rx for ibuprofen and norco given to the patient.    Final diagnoses:  Avulsion fracture of ankle, left, closed, initial encounter    *I have reviewed nursing notes, vital signs, and all  appropriate lab and imaging results for this patient.**  **I personally performed the services described in this documentation, which was scribed in my presence. The recorded information has been reviewed and is accurate.Lily Kocher, PA-C 07/14/15 [place], PA-C 07/14/15 1515  Elnora Morrison, MD 07/15/15 [phone]

## 2015-07-14 NOTE — Discharge Instructions (Signed)
Your xray reveals an avulsion fracture of the ankle. Please keep the ankle elevated above your waist. Apply ice today and tomorrow. Use ibuprofen three times daily for pain. Use norco for more severe pain. Please see Dr Aline Brochure for evaluation and management as soon as possible.

## 2015-07-14 NOTE — ED Notes (Signed)
Pt reports slipping on ice last night injuring right ankle.  Swelling has gone down, "bone is sore." Pt has kept ankle elevated and iced.

## 2015-07-15 ENCOUNTER — Ambulatory Visit (INDEPENDENT_AMBULATORY_CARE_PROVIDER_SITE_OTHER): Payer: BLUE CROSS/BLUE SHIELD | Admitting: Orthopedic Surgery

## 2015-07-15 ENCOUNTER — Encounter: Payer: Self-pay | Admitting: Orthopedic Surgery

## 2015-07-15 VITALS — Ht 66.0 in | Wt 230.0 lb

## 2015-07-15 DIAGNOSIS — S82891A Other fracture of right lower leg, initial encounter for closed fracture: Secondary | ICD-10-CM

## 2015-07-15 NOTE — Progress Notes (Signed)
Patient ID: Julie Ryan, female   DOB: 12/09/1965, 50 y.o.   MRN: [id number]  Right ankle pain   HPI Julie Ryan is a 50 y.o. female.  50 year old female fell slipped on ice complains of a one-day history of pain and swelling of the right lateral ankle and foot. Quality dull ache. Severity moderate. It's duration one day. Timing constant. Context fall. Modifying factors immobilization associated signs and symptoms swelling  Review of Systems Review of Systems  Constitutional: Negative for fever.  Musculoskeletal: Positive for joint swelling and gait problem.  Neurological: Negative for numbness.    Past Medical History  Diagnosis Date  . Anxiety   . Depression   . Obesity   . Hypertension   . Prediabetes   . Carpal tunnel syndrome of right wrist   . Chronic knee pain     Past Surgical History  Procedure Laterality Date  . Tubal ligation    . Appendectomy    . Esophagogastroduodenoscopy N/A 05/25/2013    Procedure: ESOPHAGOGASTRODUODENOSCOPY (EGD);  Surgeon: Rogene Houston, MD;  Location: [facility];  Service: Endoscopy;  Laterality: N/A;  52    Family History  Problem Relation Age of Onset  . Hypertension Mother   . Heart disease Mother   . Diabetes Mother   . Diabetes Sister   . Multiple sclerosis Sister   . Lupus Sister   . Heart disease Sister   . CVA Sister   . Diabetes Brother   . Hypertension Brother   . Hypertension Daughter     Social History Social History  Substance Use Topics  . Smoking status: Former Smoker -- 0.25 packs/day    Types: Cigarettes    Quit date: 03/31/2015  . Smokeless tobacco: Never Used     Comment: smokes occasionally   . Alcohol Use: No    Allergies  Allergen Reactions  . Codeine Nausea Only and Other (See Comments)    Dizziness   . Effexor Xr [Venlafaxine Hcl Er]     Makes patient feel on edge   . Fluoxetine     States that med caused memory problems and crying spells   . Hydrocodone Nausea Only and Other (See  Comments)    Dizziness   . Latex Itching and Rash    Current Outpatient Prescriptions  Medication Sig Dispense Refill  . fluticasone (FLONASE) 50 MCG/ACT nasal spray Place 1 spray into both nostrils daily. 16 g 5  . furosemide (LASIX) 40 MG tablet Take 1 tablet (40 mg total) by mouth daily. 30 tablet 5  . gabapentin (NEURONTIN) 300 MG capsule Take 1 capsule (300 mg total) by mouth at bedtime. 30 capsule 5  . HYDROcodone-acetaminophen (NORCO/VICODIN) 5-325 MG tablet Take 1 tablet by mouth every 4 (four) hours as needed. 15 tablet 0  . ibuprofen (ADVIL,MOTRIN) 800 MG tablet Take 1 tablet (800 mg total) by mouth 3 (three) times daily. 21 tablet 0  . loratadine (CLARITIN) 10 MG tablet Take 1 tablet (10 mg total) by mouth daily. 30 tablet 3  . lovastatin (MEVACOR) 20 MG tablet Take 1 tablet (20 mg total) by mouth at bedtime. 90 tablet 1  . metFORMIN (GLUCOPHAGE-XR) 500 MG 24 hr tablet Take 1 tablet (500 mg total) by mouth daily with breakfast. 30 tablet 5  . metoprolol tartrate (LOPRESSOR) 25 MG tablet TAKE 1 TABLET BY MOUTH TWICE A DAY FOR BLOOD PRESSURE. 60 tablet 5  . naproxen (NAPROSYN) 250 MG tablet Take 1 tablet (250 mg total) by  mouth 2 (two) times daily with a meal. (Patient not taking: Reported on 07/14/2015) 14 tablet 0  . predniSONE (DELTASONE) 20 MG tablet Take 20 mg by mouth daily with breakfast.    . promethazine (PHENERGAN) 25 MG tablet Take 1 tablet (25 mg total) by mouth every 6 (six) hours as needed for nausea or vomiting. 15 tablet 0  . ranitidine (ZANTAC) 150 MG tablet Take 1 tablet (150 mg total) by mouth 2 (two) times daily. 60 tablet 5  . traMADol (ULTRAM) 50 MG tablet Take 1 tablet (50 mg total) by mouth every 6 (six) hours as needed for moderate pain or severe pain. 15 tablet 0   No current facility-administered medications for this visit.       Physical Exam Physical Exam Last menstrual period 07/14/2015. Appearance, there are no abnormalities in terms of appearance  the patient was well-developed and well-nourished. The grooming and hygiene were normal.  Mental status orientation, there was normal alertness and orientation Mood pleasant Ambulatory status antalgic weightbearing difficulty with crutches difficulty with weightbearing in ASO brace  Examination of the right ankle  Inspection right foot and ankle tenderness lateral malleolus swelling lateral malleolus and anterior lateral ankle joint Range of motion plantar flexion 20 dorsiflexion +5 Tests for stability results of test unreliable pain swelling Motor strength  muscle tone normal Skin warm dry and intact without laceration or ulceration or erythema Neurologic examination normal sensation Vascular examination normal pulses with warm extremity and normal capillary refill  The opposite extremity normal    Data Reviewed xrays from the hospital show a tiny avulsion fracture from the distal tip of the lateral malleolus  Assessment  Avulsion fracture distal tip lateral malleolus treat his ankle sprain   Plan  Weightbearing as tolerated brace as needed  Out of work 4 weeks  Walker  Follow-up 4 weeks

## 2015-07-15 NOTE — Patient Instructions (Addendum)
WEIGHT BEAR AS TOLERATED IN BOOT  WALKER

## 2015-07-24 ENCOUNTER — Ambulatory Visit (INDEPENDENT_AMBULATORY_CARE_PROVIDER_SITE_OTHER): Payer: BLUE CROSS/BLUE SHIELD | Admitting: Orthopedic Surgery

## 2015-07-24 ENCOUNTER — Encounter: Payer: Self-pay | Admitting: Orthopedic Surgery

## 2015-07-24 VITALS — BP 140/78 | Ht 66.0 in | Wt 230.0 lb

## 2015-07-24 DIAGNOSIS — G5601 Carpal tunnel syndrome, right upper limb: Secondary | ICD-10-CM

## 2015-07-24 NOTE — Progress Notes (Signed)
Patient ID: Julie Ryan, female   DOB: 10/14/1965, 50 y.o.   MRN: [id number]  Chief Complaint  Patient presents with  . Wrist Pain    right hand tingling, numbness, and swelling, referred by Dr Merlene Laughter    HPI Julie Ryan is a 50 y.o. female.  The patient comes in for evaluation of pain and paresthesias in the right upper extremity area she is also recently been treated for an avulsion fracture ankle sprain of the right ankle she's wearing a Cam Walker  She reports no injury. 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  She also has diabetes no thyroid disease no evidence of rheumatoid arthritis in her history  She has numbness and tingling and burning stabbing pain in the right hand which partially was relieved by an injection but the numbness and tingling persist  She's been treated with gabapentin, tramadol, naproxen, prednisone and a wrist splint  She does have night pain and her pain is become constant and she rates it 10 out of 10  Review of Systems Review of Systems  She reports night sweats no fever no chills seasonal allergies endocrine symptoms are negative skin symptoms are negative she does have some ankle leg edema   Past Medical History  Diagnosis Date  . Anxiety   . Depression   . Obesity   . Hypertension   . Prediabetes   . Carpal tunnel syndrome of right wrist   . Chronic knee pain     Past Surgical History  Procedure Laterality Date  . Tubal ligation    . Appendectomy    . Esophagogastroduodenoscopy N/A 05/25/2013    Procedure: ESOPHAGOGASTRODUODENOSCOPY (EGD);  Surgeon: Rogene Houston, MD;  Location: [facility];  Service: Endoscopy;  Laterality: N/A;  75    Family History  Problem Relation Age of Onset  . Hypertension Mother   . Heart disease Mother   . Diabetes Mother   . Diabetes Sister   . Multiple sclerosis Sister   . Lupus Sister   . Heart disease Sister   . CVA Sister   . Diabetes Brother   .  Hypertension Brother   . Hypertension Daughter     Social History Social History  Substance Use Topics  . Smoking status: Former Smoker -- 0.25 packs/day    Types: Cigarettes    Quit date: 03/31/2015  . Smokeless tobacco: Never Used     Comment: smokes occasionally   . Alcohol Use: No    Allergies  Allergen Reactions  . Codeine Nausea Only and Other (See Comments)    Dizziness   . Effexor Xr [Venlafaxine Hcl Er]     Makes patient feel on edge   . Fluoxetine     States that med caused memory problems and crying spells   . Hydrocodone Nausea Only and Other (See Comments)    Dizziness   . Latex Itching and Rash    Current Outpatient Prescriptions  Medication Sig Dispense Refill  . furosemide (LASIX) 40 MG tablet Take 1 tablet (40 mg total) by mouth daily. 30 tablet 5  . gabapentin (NEURONTIN) 300 MG capsule Take 1 capsule (300 mg total) by mouth at bedtime. 30 capsule 5  . ibuprofen (ADVIL,MOTRIN) 800 MG tablet Take 1 tablet (800 mg total) by mouth 3 (three) times daily. 21 tablet 0  . loratadine (CLARITIN) 10 MG tablet Take 1 tablet (10 mg total) by mouth daily. 30 tablet 3  . lovastatin (  MEVACOR) 20 MG tablet Take 1 tablet (20 mg total) by mouth at bedtime. 90 tablet 1  . metFORMIN (GLUCOPHAGE-XR) 500 MG 24 hr tablet Take 1 tablet (500 mg total) by mouth daily with breakfast. 30 tablet 5  . metoprolol tartrate (LOPRESSOR) 25 MG tablet TAKE 1 TABLET BY MOUTH TWICE A DAY FOR BLOOD PRESSURE. 60 tablet 5  . naproxen (NAPROSYN) 250 MG tablet Take 1 tablet (250 mg total) by mouth 2 (two) times daily with a meal. 14 tablet 0  . predniSONE (DELTASONE) 20 MG tablet Take 20 mg by mouth daily with breakfast.    . ranitidine (ZANTAC) 150 MG tablet Take 1 tablet (150 mg total) by mouth 2 (two) times daily. 60 tablet 5  . traMADol (ULTRAM) 50 MG tablet Take 1 tablet (50 mg total) by mouth every 6 (six) hours as needed for moderate pain or severe pain. 15 tablet 0  . fluticasone (FLONASE) 50  MCG/ACT nasal spray Place 1 spray into both nostrils daily. 16 g 5  . HYDROcodone-acetaminophen (NORCO/VICODIN) 5-325 MG tablet Take 1 tablet by mouth every 4 (four) hours as needed. 15 tablet 0  . promethazine (PHENERGAN) 25 MG tablet Take 1 tablet (25 mg total) by mouth every 6 (six) hours as needed for nausea or vomiting. 15 tablet 0   No current facility-administered medications for this visit.     Physical Exam Blood pressure 140/78, height 5\' 6"  (1.676 m), weight 230 lb (104.327 kg), last menstrual period 07/14/2015. Physical Exam The patient is well developed well nourished and well groomed.  Orientation to person place and time is normal  Mood is pleasant.  Ambulatory status and report with a walker and a Cam Walker secondary to her recent ankle injury  Right Upper extremity examination reveals the following:  Inspection reveals no swelling. There is tenderness over the carpal tunnel.  Range of motion of the wrist and elbow are normal  Motor exam shows mild weakness with grip strength.  Wrist joint is stable  Provocative tests for carpal tunnel Phalen's test  positive Carpal tunnel compression test positive  Pulses are normal in the radial and ulnar artery with a normal Allen's test.  Decreased sensation is noted in the median nerve distribution. Soft touch is normal.  Opposite extremity normal range of motion and sensation  Data Reviewed  independent image interpretation :  No films but I had to read the nerve conduction study myself and there is increased velocity 8 versus 4 median nerve at the wrist there is also some increased velocity around the elbow area  Assessment    Right CTS    Plan    Right Carpal tunnel release as she has failed nonoperative treatment she has a 90% chance of getting better we still would like to have a formal report of the nerve study

## 2015-07-24 NOTE — Patient Instructions (Signed)
Surgery 08/01/15 Right carpal tunnel release  Carpal Tunnel Release Carpal tunnel release is a surgical procedure to relieve numbness and pain in your hand that are caused by carpal tunnel syndrome. Your carpal tunnel is a narrow, hollow space in your wrist. It passes between your wrist bones and a band of connective tissue (transverse carpal ligament). The nerve that supplies most of your hand (median nerve) passes through this space, and so do the connections between your fingers and the muscles of your arm (tendons). Carpal tunnel syndrome makes this space swell and become narrow, and this causes pain and numbness. In carpal tunnel release surgery, a surgeon cuts through the transverse carpal ligament to make more room in the carpal tunnel space. You may have this surgery if other types of treatment have not worked. LET [facility] CARE PROVIDER KNOW ABOUT:  Any allergies you have.  All medicines you are taking, including vitamins, herbs, eye drops, creams, and over-the-counter medicines.  Previous problems you or members of your family have had with the use of anesthetics.  Any blood disorders you have.  Previous surgeries you have had.  Medical conditions you have. RISKS AND COMPLICATIONS Generally, this is a safe procedure. However, problems may occur, including:  Bleeding.  Infection.  Injury to the median nerve.  Need for additional surgery. BEFORE THE PROCEDURE  Ask your health care provider about:  Changing or stopping your regular medicines. This is especially important if you are taking diabetes medicines or blood thinners.  Taking medicines such as aspirin and ibuprofen. These medicines can thin your blood. Do not take these medicines before your procedure if your health care provider instructs you not to.  Do not eat or drink anything after midnight on the night before the procedure or as directed by your health care provider.  Plan to have someone take you home  after the procedure. PROCEDURE  An IV tube may be inserted into a vein.  You will be given one of the following:  A medicine that numbs the wrist area (local anesthetic). You may also be given a medicine to make you relax (sedative).  A medicine that makes you go to sleep (general anesthetic).  Your arm, hand, and wrist will be cleaned with a germ-killing solution (antiseptic).  Your surgeon will make a surgical cut (incision) over the palm side of your wrist. The surgeon will pull aside the skin of your wrist to expose the carpal tunnel space.  The surgeon will cut the transverse carpal ligament.  The edges of the incision will be closed with stitches (sutures) or staples.  A bandage (dressing) will be placed over your wrist and wrapped around your hand and wrist. AFTER THE PROCEDURE  You may spend some time in a recovery area.  Your blood pressure, heart rate, breathing rate, and blood oxygen level will be monitored often until the medicines you were given have worn off.  You will likely have some pain. You will be given pain medicine.  You may need to wear a splint or a wrist brace over your dressing.   This information is not intended to replace advice given to you by your health care provider. Make sure you discuss any questions you have with your health care provider.   Document Released: 09/11/2003 Document Revised: 07/12/2014 Document Reviewed: 02/06/2014 Elsevier Interactive Patient Education [employer].

## 2015-07-25 NOTE — Addendum Note (Signed)
Addended by: Christia Reading on: 07/25/2015 08:27 AM   Modules accepted: Orders

## 2015-07-28 ENCOUNTER — Telehealth: Payer: Self-pay | Admitting: Orthopedic Surgery

## 2015-07-28 NOTE — Telephone Encounter (Signed)
Patient called with question about blood work ordered regarding pre-op for Dr Aline Brochure, and also per primary care, Dr Moshe Cipro; asking if needs to do both?  Her phone# is [phone]

## 2015-07-28 NOTE — Patient Instructions (Signed)
Julie Ryan  07/28/2015     @PREFPERIOPPHARMACY @   Your procedure is scheduled on 08/01/2015.  Report to [facility] at 6:15 A.M.  Call this number if you have problems the morning of surgery:  [phone]   Remember:  Do not eat food or drink liquids after midnight.  Take these medicines the morning of surgery with A SIP OF WATER Neurontin, Claritin, Metoprolol, Zantac and Ultram. Take your Flonase before leaving home on day of surgery.   Do not wear jewelry, make-up or nail polish.  Do not wear lotions, powders, or perfumes.  You may wear deodorant.  Do not shave 48 hours prior to surgery.  Men may shave face and neck.  Do not bring valuables to the hospital.  [facility] is not responsible for any belongings or valuables.  Contacts, dentures or bridgework may not be worn into surgery.  Leave your suitcase in the car.  After surgery it may be brought to your room.  For patients admitted to the hospital, discharge time will be determined by your treatment team.  Patients discharged the day of surgery will not be allowed to drive home.   Name and phone number of your driver:   family Special instructions:  n/a  Please read over the following fact sheets that you were given. Care and Recovery After Surgery    Carpal Tunnel Syndrome Carpal tunnel syndrome is a condition that causes pain in your hand and arm. The carpal tunnel is a narrow area located on the palm side of your wrist. Repeated wrist motion or certain diseases may cause swelling within the tunnel. This swelling pinches the main nerve in the wrist (median nerve). CAUSES  This condition may be caused by:   Repeated wrist motions.  Wrist injuries.  Arthritis.  A cyst or tumor in the carpal tunnel.  Fluid buildup during pregnancy. Sometimes the cause of this condition is not known.  RISK FACTORS This condition is more likely to develop in:   People who have jobs that cause them to repeatedly move their  wrists in the same motion, such as butchers and cashiers.  Women.  People with certain conditions, such as:  Diabetes.  Obesity.  An underactive thyroid (hypothyroidism).  Kidney failure. SYMPTOMS  Symptoms of this condition include:   A tingling feeling in your fingers, especially in your thumb, index, and middle fingers.  Tingling or numbness in your hand.  An aching feeling in your entire arm, especially when your wrist and elbow are bent for long periods of time.  Wrist pain that goes up your arm to your shoulder.  Pain that goes down into your palm or fingers.  A weak feeling in your hands. You may have trouble grabbing and holding items. Your symptoms may feel worse during the night.  DIAGNOSIS  This condition is diagnosed with a medical history and physical exam. You may also have tests, including:   An electromyogram (EMG). This test measures electrical signals sent by your nerves into the muscles.  X-rays. TREATMENT  Treatment for this condition includes:  Lifestyle changes. It is important to stop doing or modify the activity that caused your condition.  Physical or occupational therapy.  Medicines for pain and inflammation. This may include medicine that is injected into your wrist.  A wrist splint.  Surgery. HOME CARE INSTRUCTIONS  If You Have a Splint:  Wear it as told by your health care provider. Remove it only as told by your health  care provider.  Loosen the splint if your fingers become numb and tingle, or if they turn cold and blue.  Keep the splint clean and dry. General Instructions  Take over-the-counter and prescription medicines only as told by your health care provider.  Rest your wrist from any activity that may be causing your pain. If your condition is work related, talk to your employer about changes that can be made, such as getting a wrist pad to use while typing.  If directed, apply ice to the painful area:  Put ice in a  plastic bag.  Place a towel between your skin and the bag.  Leave the ice on for 20 minutes, 2-3 times per day.  Keep all follow-up visits as told by your health care provider. This is important.  Do any exercises as told by your health care provider, physical therapist, or occupational therapist. [place] IF:   You have new symptoms.  Your pain is not controlled with medicines.  Your symptoms get worse.   This information is not intended to replace advice given to you by your health care provider. Make sure you discuss any questions you have with your health care provider.   Document Released: 06/18/2000 Document Revised: 03/12/2015 Document Reviewed: 11/06/2014 Elsevier Interactive Patient Education [employer].

## 2015-07-28 NOTE — Telephone Encounter (Signed)
RETURNED CALL, NO ANSWER, LEFT VM 

## 2015-07-28 NOTE — Telephone Encounter (Signed)
SPOKE WITH PATIENT, QUESTION HAD BEEN RESOLVED BY LAB

## 2015-07-29 ENCOUNTER — Encounter (HOSPITAL_COMMUNITY)
Admission: RE | Admit: 2015-07-29 | Discharge: 2015-07-29 | Disposition: A | Discharge status: Home / Self Care | Payer: BLUE CROSS/BLUE SHIELD | Source: Ambulatory Visit | Attending: Orthopedic Surgery | Admitting: Orthopedic Surgery

## 2015-07-29 ENCOUNTER — Telehealth: Payer: Self-pay | Admitting: Orthopedic Surgery

## 2015-07-29 NOTE — Telephone Encounter (Signed)
Regarding out-patient surgery scheduled at [facility] 08/01/15, CPT [phone], contacted [facility], ph# [phone]; per Leata Mouse, no pre-authorization required for out-patient procedure; her name and today's date for reference: Julie Ryan, 07/29/15, 3:49p.m.

## 2015-07-30 ENCOUNTER — Encounter (HOSPITAL_COMMUNITY): Payer: Self-pay

## 2015-07-30 ENCOUNTER — Ambulatory Visit (INDEPENDENT_AMBULATORY_CARE_PROVIDER_SITE_OTHER): Payer: Self-pay | Admitting: Internal Medicine

## 2015-07-30 ENCOUNTER — Encounter (HOSPITAL_COMMUNITY)
Admission: RE | Admit: 2015-07-30 | Discharge: 2015-07-30 | Disposition: A | Discharge status: Home / Self Care | Payer: BLUE CROSS/BLUE SHIELD | Source: Ambulatory Visit | Attending: Orthopedic Surgery | Admitting: Orthopedic Surgery

## 2015-07-30 ENCOUNTER — Other Ambulatory Visit: Payer: Self-pay

## 2015-07-30 DIAGNOSIS — Z01812 Encounter for preprocedural laboratory examination: Secondary | ICD-10-CM | POA: Diagnosis not present

## 2015-07-30 DIAGNOSIS — Z87891 Personal history of nicotine dependence: Secondary | ICD-10-CM | POA: Diagnosis not present

## 2015-07-30 DIAGNOSIS — F329 Major depressive disorder, single episode, unspecified: Secondary | ICD-10-CM | POA: Diagnosis not present

## 2015-07-30 DIAGNOSIS — E669 Obesity, unspecified: Secondary | ICD-10-CM | POA: Diagnosis not present

## 2015-07-30 DIAGNOSIS — F419 Anxiety disorder, unspecified: Secondary | ICD-10-CM | POA: Diagnosis not present

## 2015-07-30 DIAGNOSIS — G5601 Carpal tunnel syndrome, right upper limb: Secondary | ICD-10-CM | POA: Diagnosis not present

## 2015-07-30 DIAGNOSIS — Z791 Long term (current) use of non-steroidal anti-inflammatories (NSAID): Secondary | ICD-10-CM | POA: Diagnosis not present

## 2015-07-30 DIAGNOSIS — I10 Essential (primary) hypertension: Secondary | ICD-10-CM | POA: Diagnosis not present

## 2015-07-30 DIAGNOSIS — Z01810 Encounter for preprocedural cardiovascular examination: Secondary | ICD-10-CM | POA: Diagnosis not present

## 2015-07-30 DIAGNOSIS — E119 Type 2 diabetes mellitus without complications: Secondary | ICD-10-CM | POA: Diagnosis not present

## 2015-07-30 LAB — CBC WITH DIFFERENTIAL/PLATELET
Basophils Absolute: 0 10*3/uL (ref 0.0–0.1)
Basophils Relative: 0 %
EOS ABS: 0.2 10*3/uL (ref 0.0–0.7)
Eosinophils Relative: 2 %
HCT: 38.8 % (ref 36.0–46.0)
HEMOGLOBIN: 12.6 g/dL (ref 12.0–15.0)
LYMPHS ABS: 3.1 10*3/uL (ref 0.7–4.0)
Lymphocytes Relative: 31 %
MCH: 28.5 pg (ref 26.0–34.0)
MCHC: 32.5 g/dL (ref 30.0–36.0)
MCV: 87.8 fL (ref 78.0–100.0)
MONO ABS: 0.5 10*3/uL (ref 0.1–1.0)
MONOS PCT: 5 %
NEUTROS PCT: 62 %
Neutro Abs: 6.3 10*3/uL (ref 1.7–7.7)
Platelets: 349 10*3/uL (ref 150–400)
RBC: 4.42 MIL/uL (ref 3.87–5.11)
RDW: 14.6 % (ref 11.5–15.5)
WBC: 10 10*3/uL (ref 4.0–10.5)

## 2015-07-30 LAB — COMPREHENSIVE METABOLIC PANEL
ALK PHOS: 79 U/L (ref 38–126)
ALT: 13 U/L — ABNORMAL LOW (ref 14–54)
ANION GAP: 8 (ref 5–15)
AST: 13 U/L — ABNORMAL LOW (ref 15–41)
Albumin: 3.4 g/dL — ABNORMAL LOW (ref 3.5–5.0)
BUN: 10 mg/dL (ref 6–20)
CALCIUM: 9.2 mg/dL (ref 8.9–10.3)
CO2: 26 mmol/L (ref 22–32)
Chloride: 104 mmol/L (ref 101–111)
Creatinine, Ser: 0.82 mg/dL (ref 0.44–1.00)
GFR calc non Af Amer: >60 mL/min (ref >60)
Glucose, Bld: 88 mg/dL (ref 65–99)
Potassium: 4.3 mmol/L (ref 3.5–5.1)
SODIUM: 138 mmol/L (ref 135–145)
Total Bilirubin: 0.8 mg/dL (ref 0.3–1.2)
Total Protein: 6.8 g/dL (ref 6.5–8.1)

## 2015-07-30 LAB — HCG, SERUM, QUALITATIVE: PREG SERUM: NEGATIVE

## 2015-07-30 LAB — TSH: TSH: 1.072 u[IU]/mL (ref 0.350–4.500)

## 2015-07-30 LAB — LIPID PANEL
CHOL/HDL RATIO: 3.4 ratio
Cholesterol: 214 mg/dL — ABNORMAL HIGH (ref 0–200)
HDL: 63 mg/dL (ref >40)
LDL Cholesterol: 134 mg/dL — ABNORMAL HIGH (ref 0–99)
Triglycerides: 86 mg/dL (ref <150)
VLDL: 17 mg/dL (ref 0–40)

## 2015-07-31 ENCOUNTER — Ambulatory Visit (INDEPENDENT_AMBULATORY_CARE_PROVIDER_SITE_OTHER): Payer: BLUE CROSS/BLUE SHIELD | Admitting: Family Medicine

## 2015-07-31 ENCOUNTER — Encounter: Payer: Self-pay | Admitting: Family Medicine

## 2015-07-31 VITALS — BP 134/82 | HR 81 | Resp 16 | Ht 66.0 in | Wt 250.0 lb

## 2015-07-31 DIAGNOSIS — K219 Gastro-esophageal reflux disease without esophagitis: Secondary | ICD-10-CM

## 2015-07-31 DIAGNOSIS — Z23 Encounter for immunization: Secondary | ICD-10-CM

## 2015-07-31 DIAGNOSIS — I10 Essential (primary) hypertension: Secondary | ICD-10-CM | POA: Diagnosis not present

## 2015-07-31 DIAGNOSIS — E8881 Metabolic syndrome: Secondary | ICD-10-CM

## 2015-07-31 DIAGNOSIS — R7303 Prediabetes: Secondary | ICD-10-CM | POA: Diagnosis not present

## 2015-07-31 DIAGNOSIS — E785 Hyperlipidemia, unspecified: Secondary | ICD-10-CM

## 2015-07-31 LAB — HEMOGLOBIN A1C
Hgb A1c MFr Bld: 6.1 % — ABNORMAL HIGH (ref 4.8–5.6)
Mean Plasma Glucose: 128 mg/dL

## 2015-07-31 LAB — HEPATITIS C ANTIBODY: HCV Ab: <0.1 s/co ratio (ref 0.0–0.9)

## 2015-07-31 MED ORDER — PANTOPRAZOLE SODIUM 40 MG PO TBEC: 40.0000 mg | DELAYED_RELEASE_TABLET | Freq: Every day | ORAL | Status: DC

## 2015-07-31 NOTE — Progress Notes (Signed)
Subjective:    Patient ID: Julie Ryan, female    DOB: 05-Aug-1965, 50 y.o.   MRN: [id number]  HPI   Julie Ryan     MRN: [id number]      DOB: 02-07-1966   HPI Julie Ryan is here for follow up and re-evaluation of chronic medical conditions, medication management and review of any available recent lab and radiology data.  Preventive health is updated, specifically  Cancer screening and Immunization.   Questions or concerns regarding consultations or procedures which the PT has had in the interim are  Addressed.Injured right foot and currently in walking boot Has carpal tunnel release in am The PT denies any adverse reactions to current medications since the last visit.  Unable to exercise and gaining significant amount of weight has questions about bariatric surgery wich she needs for weight loss   ROS Denies recent fever or chills. Denies sinus pressure, nasal congestion, ear pain or sore throat. Denies chest congestion, productive cough or wheezing. Denies chest pains, palpitations and leg swelling Denies abdominal pain, nausea, vomiting,diarrhea or constipation.   Denies dysuria, frequency, hesitancy or incontinence.  Denies headaches, seizures,  Denies depression,  Has increased  anxiety , currently unemployed and no income, denies insomnia. Denies skin break down or rash.   PE  BP 134/82 mmHg  Pulse 81  Resp 16  Ht 5\' 6"  (1.676 m)  Wt 250 lb (113.399 kg)  BMI 40.37 kg/m2  SpO2 98%  LMP 07/14/2015 (Exact Date)  Patient alert and oriented and in no cardiopulmonary distress.  HEENT: No facial asymmetry, EOMI,   oropharynx pink and moist.  Neck supple no JVD, no mass.  Chest: Clear to auscultation bilaterally.  CVS: S1, S2 no murmurs, no S3.Regular rate.  ABD: Soft non tender.   Ext: No edema  MS: Adequate ROM spine, shoulders, hips and knees.  Skin: Intact, no ulcerations or rash noted.  Psych: Good eye contact, normal affect. Memory intact not anxious  or depressed appearing.  CNS: CN 2-12 intact, power,  normal throughout.no focal deficits noted.   Assessment & Plan   Essential hypertension Controlled, no change in medication DASH diet and commitment to daily physical activity for a minimum of 30 minutes discussed and encouraged, as a part of hypertension management. The importance of attaining a healthy weight is also discussed.  BP/Weight 08/01/2015 07/31/2015 07/30/2015 07/24/2015 07/15/2015 07/14/2015 99991111  Systolic BP 123456 Q000111Q 123456 [id number] - 123456 [id number]  Diastolic BP 81 82 49 78 - 62 61  Wt. (Lbs) - 250 245 230 230 230 248  BMI - 40.37 39.56 37.14 37.14 37.14 40.05        MORBID OBESITY Deteriorated. Patient re-educated about  the importance of commitment to a  minimum of 150 minutes of exercise per week.  The importance of healthy food choices with portion control discussed. Encouraged to start a food diary, count calories and to consider  joining a support group. Sample diet sheets offered. Goals set by the patient for the next several months.   Weight /BMI 07/31/2015 07/30/2015 07/24/2015  WEIGHT 250 lb 245 lb 230 lb  HEIGHT 5\' 6"  - 5\' 6"   BMI 40.37 kg/m2 39.56 kg/m2 37.14 kg/m2    Current exercise per week 30 minutes.   Prediabetes Patient educated about the importance of limiting  Carbohydrate intake , the need to commit to daily physical activity for a minimum of 30 minutes , and to commit weight loss. The fact that changes in all  these areas will reduce or eliminate all together the development of diabetes is stressed.  Deteriorated, continue daily ,metformin and control intake to a greater extent  Diabetic Labs Latest Ref Rng 07/30/2015 12/27/2014 07/10/2014 07/03/2013 11/20/2012  HbA1c 4.8 - 5.6 % 6.1(H) 6.0(H) 6.0(H) 6.2(H) 5.8(H)  Chol 0 - 200 mg/dL 214(H) 187 210(H) 168 171  HDL >40 mg/dL 63 50 68 47 49  Calc LDL 0 - 99 mg/dL 134(H) 121(H) 126(H) 105(H) 105(H)  Triglycerides <150 mg/dL 86 78 78 81 85  Creatinine  0.44 - 1.00 mg/dL 0.82 0.80 0.81 0.90 0.73   BP/Weight 08/01/2015 07/31/2015 07/30/2015 07/24/2015 07/15/2015 07/14/2015 99991111  Systolic BP 123456 Q000111Q 123456 [id number] - 123456 [id number]  Diastolic BP 81 82 49 78 - 62 61  Wt. (Lbs) - 250 245 230 230 230 248  BMI - 40.37 39.56 37.14 37.14 37.14 40.05   No flowsheet data found.     Metabolic syndrome X The increased risk of cardiovascular disease associated with this diagnosis, and the need to consistently work on lifestyle to change this is discussed. Following  a  heart healthy diet ,commitment to 30 minutes of exercise at least 5 days per week, as well as control of blood sugar and cholesterol , and achieving a healthy weight are all the areas to be addressed .   Hyperlipidemia Deteriorated Hyperlipidemia:Low fat diet discussed and encouraged.   Lipid Panel  Lab Results  Component Value Date   CHOL 214* 07/30/2015   HDL 63 07/30/2015   LDLCALC 134* 07/30/2015   TRIG 86 07/30/2015   CHOLHDL 3.4 07/30/2015        Need for prophylactic vaccination and inoculation against influenza After obtaining informed consent, the vaccine is  administered by LPN.       Review of Systems     Objective:   Physical Exam        Assessment & Plan:

## 2015-07-31 NOTE — H&P (Signed)
Chief Complaint   Patient presents with   .  Wrist Pain       right hand tingling, numbness, and swelling, referred by Dr Merlene Laughter     HPI Julie Ryan is a 50 y.o. female.  The patient comes in for evaluation of pain and paresthesias in the right upper extremity area she is also recently been treated for an avulsion fracture ankle sprain of the right ankle she's wearing a Cam Walker  She reports no injury. 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  She also has diabetes no thyroid disease no evidence of rheumatoid arthritis in her history  She has numbness and tingling and burning stabbing pain in the right hand which partially was relieved by an injection but the numbness and tingling persist  She's been treated with gabapentin, tramadol, naproxen, prednisone and a wrist splint  She does have night pain and her pain is become constant and she rates it 10 out of 10  Review of Systems Review of Systems  She reports night sweats no fever no chills seasonal allergies endocrine symptoms are negative skin symptoms are negative she does have some ankle leg edema     Past Medical History   Diagnosis  Date   .  Anxiety     .  Depression     .  Obesity     .  Hypertension     .  Prediabetes     .  Carpal tunnel syndrome of right wrist     .  Chronic knee pain         Past Surgical History   Procedure  Laterality  Date   .  Tubal ligation       .  Appendectomy       .  Esophagogastroduodenoscopy  N/A  05/25/2013       Procedure: ESOPHAGOGASTRODUODENOSCOPY (EGD);  Surgeon: Rogene Houston, MD;  Location: [facility];  Service: Endoscopy;  Laterality: N/A;  45       Family History   Problem  Relation  Age of Onset   .  Hypertension  Mother     .  Heart disease  Mother     .  Diabetes  Mother     .  Diabetes  Sister     .  Multiple sclerosis  Sister     .  Lupus  Sister     .  Heart disease  Sister     .  CVA  Sister     .  Diabetes   Brother     .  Hypertension  Brother     .  Hypertension  Daughter       Social History Social History   Substance Use Topics   .  Smoking status:  Former Smoker -- 0.25 packs/day       Types:  Cigarettes       Quit date:  03/31/2015   .  Smokeless tobacco:  Never Used         Comment: smokes occasionally    .  Alcohol Use:  No       Allergies   Allergen  Reactions   .  Codeine  Nausea Only and Other (See Comments)       Dizziness    .  Effexor Xr [Venlafaxine Hcl Er]         Makes patient feel on edge    .  Fluoxetine  States that med caused memory problems and crying spells    .  Hydrocodone  Nausea Only and Other (See Comments)       Dizziness    .  Latex  Itching and Rash       Current Outpatient Prescriptions   Medication  Sig  Dispense  Refill   .  furosemide (LASIX) 40 MG tablet  Take 1 tablet (40 mg total) by mouth daily.  30 tablet  5   .  gabapentin (NEURONTIN) 300 MG capsule  Take 1 capsule (300 mg total) by mouth at bedtime.  30 capsule  5   .  ibuprofen (ADVIL,MOTRIN) 800 MG tablet  Take 1 tablet (800 mg total) by mouth 3 (three) times daily.  21 tablet  0   .  loratadine (CLARITIN) 10 MG tablet  Take 1 tablet (10 mg total) by mouth daily.  30 tablet  3   .  lovastatin (MEVACOR) 20 MG tablet  Take 1 tablet (20 mg total) by mouth at bedtime.  90 tablet  1   .  metFORMIN (GLUCOPHAGE-XR) 500 MG 24 hr tablet  Take 1 tablet (500 mg total) by mouth daily with breakfast.  30 tablet  5   .  metoprolol tartrate (LOPRESSOR) 25 MG tablet  TAKE 1 TABLET BY MOUTH TWICE A DAY FOR BLOOD PRESSURE.  60 tablet  5   .  naproxen (NAPROSYN) 250 MG tablet  Take 1 tablet (250 mg total) by mouth 2 (two) times daily with a meal.  14 tablet  0   .  predniSONE (DELTASONE) 20 MG tablet  Take 20 mg by mouth daily with breakfast.       .  ranitidine (ZANTAC) 150 MG tablet  Take 1 tablet (150 mg total) by mouth 2 (two) times daily.  60 tablet  5   .  traMADol (ULTRAM) 50 MG tablet  Take  1 tablet (50 mg total) by mouth every 6 (six) hours as needed for moderate pain or severe pain.  15 tablet  0   .  fluticasone (FLONASE) 50 MCG/ACT nasal spray  Place 1 spray into both nostrils daily.  16 g  5   .  HYDROcodone-acetaminophen (NORCO/VICODIN) 5-325 MG tablet  Take 1 tablet by mouth every 4 (four) hours as needed.  15 tablet  0   .  promethazine (PHENERGAN) 25 MG tablet  Take 1 tablet (25 mg total) by mouth every 6 (six) hours as needed for nausea or vomiting.  15 tablet  0      No current facility-administered medications for this visit.      Physical Exam Blood pressure 140/78, height 5\' 6"  (1.676 m), weight 230 lb (104.327 kg), last menstrual period 07/14/2015. Physical Exam The patient is well developed well nourished and well groomed.   Orientation to person place and time is normal   Mood is pleasant.  Ambulatory status and report with a walker and a Cam Walker secondary to her recent ankle injury  Right Upper extremity examination reveals the following:  Inspection reveals no swelling. There is tenderness over the carpal tunnel.  Range of motion of the wrist and elbow are normal  Motor exam shows mild weakness with grip strength.  Wrist joint is stable  Provocative tests for carpal tunnel Phalen's test  positive Carpal tunnel compression test positive  Pulses are normal in the radial and ulnar artery with a normal Allen's test.  Decreased sensation is noted in the median nerve distribution.  Soft touch is normal.  Opposite extremity normal range of motion and sensation  Data Reviewed  independent image interpretation :  No films but I had to read the nerve conduction study myself and there is increased velocity 8 versus 4 median nerve at the wrist there is also some increased velocity around the elbow area  Assessment    Right CTS    Plan    Right Carpal tunnel release as she has failed nonoperative treatment she has a 90% chance of getting better  we still would like to have a formal report of the nerve study

## 2015-07-31 NOTE — Patient Instructions (Signed)
F/u in 4.5 month, call if you need me before  Flu vaccine today  All the best with surgery  Pls change eating to help with weight loss, and you are provided with info on bariatric surgery, you schedule your appt  Blood pressure is good  Hope that you feel better soon  Thanks for choosing [facility], we consider it a privelige to serve you.   HBA1C in 4.5 month

## 2015-08-01 ENCOUNTER — Ambulatory Visit (HOSPITAL_COMMUNITY): Payer: BLUE CROSS/BLUE SHIELD | Admitting: Anesthesiology

## 2015-08-01 ENCOUNTER — Encounter (HOSPITAL_COMMUNITY): Payer: Self-pay | Admitting: *Deleted

## 2015-08-01 ENCOUNTER — Ambulatory Visit (HOSPITAL_COMMUNITY)
Admission: RE | Admit: 2015-08-01 | Discharge: 2015-08-01 | Disposition: A | Discharge status: Home / Self Care | Payer: BLUE CROSS/BLUE SHIELD | Source: Ambulatory Visit | Attending: Orthopedic Surgery | Admitting: Orthopedic Surgery

## 2015-08-01 ENCOUNTER — Encounter (HOSPITAL_COMMUNITY)
Admission: RE | Disposition: A | Discharge status: Home / Self Care | Payer: Self-pay | Source: Ambulatory Visit | Attending: Orthopedic Surgery

## 2015-08-01 DIAGNOSIS — F329 Major depressive disorder, single episode, unspecified: Secondary | ICD-10-CM | POA: Insufficient documentation

## 2015-08-01 DIAGNOSIS — Z87891 Personal history of nicotine dependence: Secondary | ICD-10-CM | POA: Insufficient documentation

## 2015-08-01 DIAGNOSIS — Z01810 Encounter for preprocedural cardiovascular examination: Secondary | ICD-10-CM | POA: Insufficient documentation

## 2015-08-01 DIAGNOSIS — F419 Anxiety disorder, unspecified: Secondary | ICD-10-CM | POA: Insufficient documentation

## 2015-08-01 DIAGNOSIS — Z791 Long term (current) use of non-steroidal anti-inflammatories (NSAID): Secondary | ICD-10-CM | POA: Insufficient documentation

## 2015-08-01 DIAGNOSIS — E669 Obesity, unspecified: Secondary | ICD-10-CM | POA: Insufficient documentation

## 2015-08-01 DIAGNOSIS — I10 Essential (primary) hypertension: Secondary | ICD-10-CM | POA: Insufficient documentation

## 2015-08-01 DIAGNOSIS — Z01812 Encounter for preprocedural laboratory examination: Secondary | ICD-10-CM | POA: Insufficient documentation

## 2015-08-01 DIAGNOSIS — G5601 Carpal tunnel syndrome, right upper limb: Secondary | ICD-10-CM | POA: Diagnosis not present

## 2015-08-01 DIAGNOSIS — E119 Type 2 diabetes mellitus without complications: Secondary | ICD-10-CM | POA: Insufficient documentation

## 2015-08-01 HISTORY — PX: CARPAL TUNNEL RELEASE: SHX101

## 2015-08-01 LAB — GLUCOSE, CAPILLARY: GLUCOSE-CAPILLARY: 95 mg/dL (ref 65–99)

## 2015-08-01 SURGERY — CARPAL TUNNEL RELEASE
Anesthesia: Regional | Laterality: Right

## 2015-08-01 MED ORDER — ONDANSETRON HCL 4 MG/2ML IJ SOLN: 4.0000 mg | Freq: Once | INTRAMUSCULAR | Status: DC | PRN

## 2015-08-01 MED ORDER — PROMETHAZINE HCL 12.5 MG PO TABS: 12.5000 mg | ORAL_TABLET | Freq: Four times a day (QID) | ORAL | Status: DC | PRN

## 2015-08-01 MED ORDER — IBUPROFEN 800 MG PO TABS: 800.0000 mg | ORAL_TABLET | Freq: Three times a day (TID) | ORAL | Status: DC

## 2015-08-01 MED ORDER — FENTANYL CITRATE (PF) 100 MCG/2ML IJ SOLN
INTRAMUSCULAR | Status: AC
  Filled 2015-08-01: qty 2

## 2015-08-01 MED ORDER — ONDANSETRON HCL 4 MG/2ML IJ SOLN
4.0000 mg | Freq: Once | INTRAMUSCULAR | Status: AC
  Administered 2015-08-01: 4 mg via INTRAVENOUS

## 2015-08-01 MED ORDER — MIDAZOLAM HCL 5 MG/5ML IJ SOLN
INTRAMUSCULAR | Status: DC | PRN
  Administered 2015-08-01: 2 mg via INTRAVENOUS

## 2015-08-01 MED ORDER — CEFAZOLIN SODIUM-DEXTROSE 2-3 GM-% IV SOLR
2.0000 g | INTRAVENOUS | Status: DC
  Filled 2015-08-01: qty 50

## 2015-08-01 MED ORDER — LACTATED RINGERS IV SOLN
INTRAVENOUS | Status: DC
  Administered 2015-08-01: 09:00:00 via INTRAVENOUS

## 2015-08-01 MED ORDER — MIDAZOLAM HCL 2 MG/2ML IJ SOLN
1.0000 mg | INTRAMUSCULAR | Status: DC | PRN
  Administered 2015-08-01: 2 mg via INTRAVENOUS

## 2015-08-01 MED ORDER — MIDAZOLAM HCL 2 MG/2ML IJ SOLN
INTRAMUSCULAR | Status: AC
  Filled 2015-08-01: qty 2

## 2015-08-01 MED ORDER — LIDOCAINE HCL (PF) 0.5 % IJ SOLN
INTRAMUSCULAR | Status: DC | PRN
  Administered 2015-08-01: 50 mL via INTRAVENOUS

## 2015-08-01 MED ORDER — PROPOFOL 500 MG/50ML IV EMUL
INTRAVENOUS | Status: DC | PRN
Start: 2015-08-01 — End: 2015-08-01
  Administered 2015-08-01: 75 ug/kg/min via INTRAVENOUS

## 2015-08-01 MED ORDER — BUPIVACAINE HCL (PF) 0.5 % IJ SOLN
INTRAMUSCULAR | Status: AC
  Filled 2015-08-01: qty 30

## 2015-08-01 MED ORDER — FENTANYL CITRATE (PF) 100 MCG/2ML IJ SOLN
INTRAMUSCULAR | Status: DC | PRN
  Administered 2015-08-01: 50 ug via INTRAVENOUS

## 2015-08-01 MED ORDER — CHLORHEXIDINE GLUCONATE 4 % EX LIQD: 60.0000 mL | Freq: Once | CUTANEOUS | Status: DC

## 2015-08-01 MED ORDER — PROPOFOL 10 MG/ML IV BOLUS
INTRAVENOUS | Status: DC | PRN
  Administered 2015-08-01: 11 mg via INTRAVENOUS

## 2015-08-01 MED ORDER — LIDOCAINE HCL (PF) 1 % IJ SOLN
INTRAMUSCULAR | Status: AC
  Filled 2015-08-01: qty 5

## 2015-08-01 MED ORDER — ONDANSETRON HCL 4 MG/2ML IJ SOLN
INTRAMUSCULAR | Status: AC
  Filled 2015-08-01: qty 2

## 2015-08-01 MED ORDER — LIDOCAINE HCL (PF) 0.5 % IJ SOLN
INTRAMUSCULAR | Status: AC
  Filled 2015-08-01: qty 50

## 2015-08-01 MED ORDER — HYDROMORPHONE HCL 2 MG PO TABS: 2.0000 mg | ORAL_TABLET | ORAL | Status: DC | PRN

## 2015-08-01 MED ORDER — 0.9 % SODIUM CHLORIDE (POUR BTL) OPTIME
TOPICAL | Status: DC | PRN
  Administered 2015-08-01: 1000 mL

## 2015-08-01 MED ORDER — FENTANYL CITRATE (PF) 100 MCG/2ML IJ SOLN: 25.0000 ug | INTRAMUSCULAR | Status: DC | PRN

## 2015-08-01 MED ORDER — BUPIVACAINE HCL (PF) 0.5 % IJ SOLN
INTRAMUSCULAR | Status: DC | PRN
  Administered 2015-08-01: 10 mL

## 2015-08-01 MED ORDER — FENTANYL CITRATE (PF) 100 MCG/2ML IJ SOLN
25.0000 ug | INTRAMUSCULAR | Status: AC
Start: 2015-08-01 — End: 2015-08-01
  Administered 2015-08-01 (×2): 25 ug via INTRAVENOUS

## 2015-08-01 SURGICAL SUPPLY — 38 items
BAG HAMPER (MISCELLANEOUS) ×3 IMPLANT
BANDAGE ELASTIC 3 VELCRO NS (GAUZE/BANDAGES/DRESSINGS) ×3 IMPLANT
BANDAGE ESMARK 4X12 BL STRL LF (DISPOSABLE) ×1 IMPLANT
BLADE SURG 15 STRL LF DISP TIS (BLADE) ×1 IMPLANT
BLADE SURG 15 STRL SS (BLADE) ×2
BNDG COHESIVE 4X5 TAN STRL (GAUZE/BANDAGES/DRESSINGS) ×3 IMPLANT
BNDG ESMARK 4X12 BLUE STRL LF (DISPOSABLE) ×3
BNDG GAUZE ELAST 4 BULKY (GAUZE/BANDAGES/DRESSINGS) ×3 IMPLANT
CHLORAPREP W/TINT 26ML (MISCELLANEOUS) ×3 IMPLANT
CLOTH BEACON ORANGE TIMEOUT ST (SAFETY) ×3 IMPLANT
COVER LIGHT HANDLE STERIS (MISCELLANEOUS) ×6 IMPLANT
CUFF TOURNIQUET SINGLE 24IN (TOURNIQUET CUFF) ×3 IMPLANT
DECANTER SPIKE VIAL GLASS SM (MISCELLANEOUS) ×3 IMPLANT
DRAPE PROXIMA HALF (DRAPES) ×3 IMPLANT
DRSG XEROFORM 1X8 (GAUZE/BANDAGES/DRESSINGS) ×3 IMPLANT
ELECT NEEDLE TIP 2.8 STRL (NEEDLE) ×3 IMPLANT
ELECT REM PT RETURN 9FT ADLT (ELECTROSURGICAL) ×3
ELECTRODE REM PT RTRN 9FT ADLT (ELECTROSURGICAL) ×1 IMPLANT
GAUZE SPONGE 4X4 12PLY STRL (GAUZE/BANDAGES/DRESSINGS) ×3 IMPLANT
GLOVE BIOGEL M 7.0 STRL (GLOVE) ×3 IMPLANT
GLOVE BIOGEL PI IND STRL 7.0 (GLOVE) ×2 IMPLANT
GLOVE BIOGEL PI INDICATOR 7.0 (GLOVE) ×4
GLOVE SKINSENSE NS SZ8.0 LF (GLOVE) ×2
GLOVE SKINSENSE STRL SZ8.0 LF (GLOVE) ×1 IMPLANT
GLOVE SS N UNI LF 8.5 STRL (GLOVE) ×3 IMPLANT
GOWN STRL REUS W/ TWL LRG LVL3 (GOWN DISPOSABLE) ×1 IMPLANT
GOWN STRL REUS W/TWL LRG LVL3 (GOWN DISPOSABLE) ×2
GOWN STRL REUS W/TWL XL LVL3 (GOWN DISPOSABLE) ×3 IMPLANT
HAND ALUMI XLG (SOFTGOODS) ×3 IMPLANT
KIT ROOM TURNOVER APOR (KITS) ×3 IMPLANT
MANIFOLD NEPTUNE II (INSTRUMENTS) ×3 IMPLANT
NEEDLE HYPO 21X1.5 SAFETY (NEEDLE) ×3 IMPLANT
NS IRRIG 1000ML POUR BTL (IV SOLUTION) ×3 IMPLANT
PACK BASIC LIMB (CUSTOM PROCEDURE TRAY) ×3 IMPLANT
PAD ARMBOARD 7.5X6 YLW CONV (MISCELLANEOUS) ×3 IMPLANT
SET BASIN LINEN APH (SET/KITS/TRAYS/PACK) ×3 IMPLANT
SUT ETHILON 3 0 FSL (SUTURE) ×3 IMPLANT
SYR CONTROL 10ML LL (SYRINGE) ×3 IMPLANT

## 2015-08-01 NOTE — Anesthesia Procedure Notes (Signed)
Anesthesia Regional Block:  Bier block (IV Regional)  Pre-Anesthetic Checklist: ,, timeout performed, Correct Patient, Correct Site, Correct Laterality, Correct Procedure,, site marked, surgical consent,, at surgeon's request Needles:  Injection technique: Single-shot  Needle Type: Other      Needle Gauge: 22 and 22 G    Additional Needles: Bier block (IV Regional)  Nerve Stimulator or Paresthesia:   Additional Responses:  Pulse checked post tourniquet inflation. IV NSL discontinued post injection. Narrative:   Performed by: Personally     

## 2015-08-01 NOTE — Brief Op Note (Signed)
08/01/2015  10:24 AM  PATIENT:  Julie Ryan  50 y.o. female  PRE-OPERATIVE DIAGNOSIS:  right carpal tunnel syndrome  POST-OPERATIVE DIAGNOSIS:  right carpal tunnel syndrome  PROCEDURE:  Procedure(s): RIGHT CARPAL TUNNEL RELEASE (Right)  FINDINGS: NERVE FLATTENING, DISCOLORED  SURGEON:  Surgeon(s) and Role:    * Carole Civil, MD - Primary  PHYSICIAN ASSISTANT:   ASSISTANTS: none   ANESTHESIA:   regional  EBL:     BLOOD ADMINISTERED:none  DRAINS: none   LOCAL MEDICATIONS USED:  MARCAINE     SPECIMEN:  No Specimen  DISPOSITION OF SPECIMEN:  N/A  COUNTS:  YES  TOURNIQUET:   Total Tourniquet Time Documented: Upper Arm (Right) - 25 minutes Total: Upper Arm (Right) - 25 minutes   DICTATION: .Viviann Spare Dictation  PLAN OF CARE: Discharge to home after PACU  PATIENT DISPOSITION:  PACU - hemodynamically stable.   Delay start of Pharmacological VTE agent (>24hrs) due to surgical blood loss or risk of bleeding: yes  Carpal tunnel release right wrist  Preop diagnosis carpal tunnel syndrome right wrist postop diagnosis same  Procedure open carpal tunnel release right wrist  Surgeon Aline Brochure  Anesthesia regional Bier block  Indications failure of conservative treatment to relieve pain and paresthesias and numbness and tingling of the right hand.  The patient was identified in the preop area we confirm the surgical site marked as right wrist. Chart update completed. Patient taken to surgery. She had 2 g of Ancef. After establishing a Bier block her arm was prepped with ChloraPrep.  Timeout executed completed and confirmed site.  A straight incision was made over the carpal tunnel in line with the radial border of the ring finger. Blunt dissection was carried out to find the distal aspect of the carpal tunnel. A blunted judgment was passed beneath the carpal tunnel. Sharp incision was then used to release the transverse carpal ligament. The contents of the  carpal tunnel were inspected. The median nerve was compressed with slight discoloration.  The wound was irrigated and then closed with 3-0 nylon suture. We injected 10 mL of plain Marcaine on the radial side of the incision  A sterile bandage was applied and the tourniquet was released the color of the hand and capillary refill were normal  The patient was taken to the recovery room in stable condition

## 2015-08-01 NOTE — Anesthesia Preprocedure Evaluation (Signed)
Anesthesia Evaluation  Patient identified by MRN, date of birth, ID band Patient awake    Reviewed: Allergy & Precautions, NPO status , Patient's Chart, lab work & pertinent test results  Airway Mallampati: II  TM Distance: >3 FB     Dental  (+) Teeth Intact, Dental Advisory Given   Pulmonary former smoker,    breath sounds clear to auscultation       Cardiovascular hypertension, Pt. on medications  Rhythm:Regular Rate:Normal     Neuro/Psych PSYCHIATRIC DISORDERS Anxiety Depression  Neuromuscular disease    GI/Hepatic PUD, GERD  ,  Endo/Other  diabetes (pre-diabetes)Morbid obesity  Renal/GU      Musculoskeletal   Abdominal   Peds  Hematology   Anesthesia Other Findings   Reproductive/Obstetrics                             Anesthesia Physical Anesthesia Plan  ASA: III  Anesthesia Plan: Bier Block   Post-op Pain Management:    Induction: Intravenous  Airway Management Planned: Nasal Cannula  Additional Equipment:   Intra-op Plan:   Post-operative Plan:   Informed Consent: I have reviewed the patients History and Physical, chart, labs and discussed the procedure including the risks, benefits and alternatives for the proposed anesthesia with the patient or authorized representative who has indicated his/her understanding and acceptance.     Plan Discussed with:   Anesthesia Plan Comments:         Anesthesia Quick Evaluation

## 2015-08-01 NOTE — Interval H&P Note (Signed)
History and Physical Interval Note:  08/01/2015 9:25 AM  Julie Ryan  has presented today for surgery, with the diagnosis of right carpal tunnel syndrome  The various methods of treatment have been discussed with the patient and family. After consideration of risks, benefits and other options for treatment, the patient has consented to  Procedure(s) with comments: CARPAL TUNNEL RELEASE (Right) - pt knows to arrive at 8:30 as a surgical intervention .  The patient's history has been reviewed, patient examined, no change in status, stable for surgery.  I have reviewed the patient's chart and labs.  Questions were answered to the patient's satisfaction.     Arther Abbott

## 2015-08-01 NOTE — Op Note (Signed)
08/01/2015  10:24 AM  PATIENT:  Julie Ryan  50 y.o. female  PRE-OPERATIVE DIAGNOSIS:  right carpal tunnel syndrome  POST-OPERATIVE DIAGNOSIS:  right carpal tunnel syndrome  PROCEDURE:  Procedure(s): RIGHT CARPAL TUNNEL RELEASE (Right)  FINDINGS: NERVE FLATTENING, DISCOLORED  SURGEON:  Surgeon(s) and Role:    * Carole Civil, MD - Primary  PHYSICIAN ASSISTANT:   ASSISTANTS: none   ANESTHESIA:   regional  EBL:     BLOOD ADMINISTERED:none  DRAINS: none   LOCAL MEDICATIONS USED:  MARCAINE     SPECIMEN:  No Specimen  DISPOSITION OF SPECIMEN:  N/A  COUNTS:  YES  TOURNIQUET:   Total Tourniquet Time Documented: Upper Arm (Right) - 25 minutes Total: Upper Arm (Right) - 25 minutes   DICTATION: .Viviann Spare Dictation  PLAN OF CARE: Discharge to home after PACU  PATIENT DISPOSITION:  PACU - hemodynamically stable.   Delay start of Pharmacological VTE agent (>24hrs) due to surgical blood loss or risk of bleeding: yes  Carpal tunnel release right wrist  Preop diagnosis carpal tunnel syndrome right wrist postop diagnosis same  Procedure open carpal tunnel release right wrist  Surgeon Aline Brochure  Anesthesia regional Bier block  Indications failure of conservative treatment to relieve pain and paresthesias and numbness and tingling of the right hand.  The patient was identified in the preop area we confirm the surgical site marked as right wrist. Chart update completed. Patient taken to surgery. She had 2 g of Ancef. After establishing a Bier block her arm was prepped with ChloraPrep.  Timeout executed completed and confirmed site.  A straight incision was made over the carpal tunnel in line with the radial border of the ring finger. Blunt dissection was carried out to find the distal aspect of the carpal tunnel. A blunted judgment was passed beneath the carpal tunnel. Sharp incision was then used to release the transverse carpal ligament. The contents of the  carpal tunnel were inspected. The median nerve was compressed with slight discoloration.  The wound was irrigated and then closed with 3-0 nylon suture. We injected 10 mL of plain Marcaine on the radial side of the incision  A sterile bandage was applied and the tourniquet was released the color of the hand and capillary refill were normal  The patient was taken to the recovery room in stable condition

## 2015-08-01 NOTE — Transfer of Care (Signed)
Immediate Anesthesia Transfer of Care Note  Patient: Julie Ryan  Procedure(s) Performed: Procedure(s): RIGHT CARPAL TUNNEL RELEASE (Right)  Patient Location: PACU  Anesthesia Type:Bier block  Level of Consciousness: awake and alert   Airway & Oxygen Therapy: Patient Spontanous Breathing and Patient connected to nasal cannula oxygen  Post-op Assessment: Report given to RN  Post vital signs: Reviewed and stable  Last Vitals:  Filed Vitals:   08/01/15 0845 08/01/15 0850  BP: 128/63 124/62  Temp:    Resp: 11 10    Complications: No apparent anesthesia complications

## 2015-08-02 DIAGNOSIS — Z23 Encounter for immunization: Secondary | ICD-10-CM | POA: Insufficient documentation

## 2015-08-02 NOTE — Anesthesia Postprocedure Evaluation (Signed)
Anesthesia Post Note  Patient: Julie Ryan  Late entry  Procedure(s) Performed: Procedure(s) (LRB): RIGHT CARPAL TUNNEL RELEASE (Right)  Patient location during evaluation: PACU Anesthesia Type: Bier Block Level of consciousness: awake and alert Pain management: pain level controlled Vital Signs Assessment: post-procedure vital signs reviewed and stable Respiratory status: spontaneous breathing Cardiovascular status: blood pressure returned to baseline Postop Assessment: no signs of nausea or vomiting Anesthetic complications: no    Last Vitals:  Filed Vitals:   08/01/15 1100 08/01/15 1130  BP: 125/73 148/81  Pulse: 49 57  Temp:  36.6 C  Resp: 17 16    Last Pain:  Filed Vitals:   08/01/15 1131  PainSc: 0-No pain                 Ivan Maskell

## 2015-08-02 NOTE — Assessment & Plan Note (Signed)
Deteriorated. Patient re-educated about  the importance of commitment to a  minimum of 150 minutes of exercise per week.  The importance of healthy food choices with portion control discussed. Encouraged to start a food diary, count calories and to consider  joining a support group. Sample diet sheets offered. Goals set by the patient for the next several months.   Weight /BMI 07/31/2015 07/30/2015 07/24/2015  WEIGHT 250 lb 245 lb 230 lb  HEIGHT 5\' 6"  - 5\' 6"   BMI 40.37 kg/m2 39.56 kg/m2 37.14 kg/m2    Current exercise per week 30 minutes.

## 2015-08-02 NOTE — Assessment & Plan Note (Signed)
Patient educated about the importance of limiting  Carbohydrate intake , the need to commit to daily physical activity for a minimum of 30 minutes , and to commit weight loss. The fact that changes in all these areas will reduce or eliminate all together the development of diabetes is stressed.  Deteriorated, continue daily ,metformin and control intake to a greater extent  Diabetic Labs Latest Ref Rng 07/30/2015 12/27/2014 07/10/2014 07/03/2013 11/20/2012  HbA1c 4.8 - 5.6 % 6.1(H) 6.0(H) 6.0(H) 6.2(H) 5.8(H)  Chol 0 - 200 mg/dL 214(H) 187 210(H) 168 171  HDL >40 mg/dL 63 50 68 47 49  Calc LDL 0 - 99 mg/dL 134(H) 121(H) 126(H) 105(H) 105(H)  Triglycerides <150 mg/dL 86 78 78 81 85  Creatinine 0.44 - 1.00 mg/dL 0.82 0.80 0.81 0.90 0.73   BP/Weight 08/01/2015 07/31/2015 07/30/2015 07/24/2015 07/15/2015 07/14/2015 99991111  Systolic BP 123456 Q000111Q 123456 [id number] - 123456 [id number]  Diastolic BP 81 82 49 78 - 62 61  Wt. (Lbs) - 250 245 230 230 230 248  BMI - 40.37 39.56 37.14 37.14 37.14 40.05   No flowsheet data found.

## 2015-08-02 NOTE — Assessment & Plan Note (Signed)
After obtaining informed consent, the vaccine is  administered by LPN.  

## 2015-08-02 NOTE — Assessment & Plan Note (Signed)
The increased risk of cardiovascular disease associated with this diagnosis, and the need to consistently work on lifestyle to change this is discussed. Following  a  heart healthy diet ,commitment to 30 minutes of exercise at least 5 days per week, as well as control of blood sugar and cholesterol , and achieving a healthy weight are all the areas to be addressed .  

## 2015-08-02 NOTE — Assessment & Plan Note (Signed)
Controlled, no change in medication DASH diet and commitment to daily physical activity for a minimum of 30 minutes discussed and encouraged, as a part of hypertension management. The importance of attaining a healthy weight is also discussed.  BP/Weight 08/01/2015 07/31/2015 07/30/2015 07/24/2015 07/15/2015 07/14/2015 99991111  Systolic BP 123456 Q000111Q 123456 [id number] - 123456 [id number]  Diastolic BP 81 82 49 78 - 62 61  Wt. (Lbs) - 250 245 230 230 230 248  BMI - 40.37 39.56 37.14 37.14 37.14 40.05

## 2015-08-02 NOTE — Assessment & Plan Note (Signed)
Deteriorated Hyperlipidemia:Low fat diet discussed and encouraged.   Lipid Panel  Lab Results  Component Value Date   CHOL 214* 07/30/2015   HDL 63 07/30/2015   LDLCALC 134* 07/30/2015   TRIG 86 07/30/2015   CHOLHDL 3.4 07/30/2015

## 2015-08-04 ENCOUNTER — Encounter (HOSPITAL_COMMUNITY): Payer: Self-pay | Admitting: Orthopedic Surgery

## 2015-08-08 ENCOUNTER — Other Ambulatory Visit: Payer: Self-pay | Admitting: Family Medicine

## 2015-08-08 ENCOUNTER — Encounter: Payer: Self-pay | Admitting: Orthopedic Surgery

## 2015-08-08 ENCOUNTER — Ambulatory Visit (INDEPENDENT_AMBULATORY_CARE_PROVIDER_SITE_OTHER): Payer: BLUE CROSS/BLUE SHIELD | Admitting: Orthopedic Surgery

## 2015-08-08 VITALS — BP 148/91 | HR 84 | Temp 97.9°F | Resp 16 | Ht 66.5 in | Wt 250.0 lb

## 2015-08-08 DIAGNOSIS — Z9889 Other specified postprocedural states: Secondary | ICD-10-CM

## 2015-08-08 NOTE — Progress Notes (Signed)
  Julie Ryan is status post right carpal tunnel release   S:   Chief Complaint  Patient presents with  . Carpal Tunnel    Rt carpal tunnel surgery 08/01/15. Middle finger still numb.   Doing well.   The patient complains of  some residual numbness primarily middle finger but also tip of the index finger  Current pain medication: Dilaudid  The incision is clean dry and intact with no drainage The dressing was changed.  Patient to return for suture removal ~ POD 12-14

## 2015-08-12 ENCOUNTER — Ambulatory Visit (INDEPENDENT_AMBULATORY_CARE_PROVIDER_SITE_OTHER): Payer: BLUE CROSS/BLUE SHIELD | Admitting: Orthopedic Surgery

## 2015-08-12 ENCOUNTER — Encounter: Payer: Self-pay | Admitting: Orthopedic Surgery

## 2015-08-12 ENCOUNTER — Ambulatory Visit (INDEPENDENT_AMBULATORY_CARE_PROVIDER_SITE_OTHER): Payer: BLUE CROSS/BLUE SHIELD

## 2015-08-12 VITALS — BP 118/72 | Ht 66.5 in | Wt 250.0 lb

## 2015-08-12 DIAGNOSIS — S82891D Other fracture of right lower leg, subsequent encounter for closed fracture with routine healing: Secondary | ICD-10-CM

## 2015-08-12 DIAGNOSIS — M25571 Pain in right ankle and joints of right foot: Secondary | ICD-10-CM | POA: Diagnosis not present

## 2015-08-12 NOTE — Patient Instructions (Signed)
SELF WEAN FROM BOOT, ANKLE EXERCISES,

## 2015-08-12 NOTE — Progress Notes (Signed)
Patient ID: Julie Ryan, female   DOB: 05/01/1966, 50 y.o.   MRN: [id number]  Chief Complaint  Patient presents with  . Follow-up    4 week follow up + xray Rt ankle fx, DOI 07/14/15   status post avulsion fracture right ankle. Wearing Cam Walker. Pain is decreasing function is improving  She also had a Carpal tunnel release 08-01-15 POD # 11   Past Medical History  Diagnosis Date  . Anxiety   . Depression   . Obesity   . Hypertension   . Prediabetes   . Carpal tunnel syndrome of right wrist   . Chronic knee pain     Ht 5' 6.5" (1.689 m)  Wt 250 lb (113.399 kg)  BMI 39.75 kg/m2  LMP 07/14/2015 (Exact Date)  Encounter Diagnosis  Name Primary?  Marland Kitchen Avulsion fracture of ankle, right, closed, with routine healing, subsequent encounter Yes   Current Outpatient Prescriptions  Medication Sig Dispense Refill  . furosemide (LASIX) 40 MG tablet Take 1 tablet (40 mg total) by mouth daily. 30 tablet 5  . gabapentin (NEURONTIN) 300 MG capsule Take 1 capsule (300 mg total) by mouth at bedtime. 30 capsule 5  . HYDROmorphone (DILAUDID) 2 MG tablet Take 1 tablet (2 mg total) by mouth every 4 (four) hours as needed for severe pain. 40 tablet 0  . ibuprofen (ADVIL,MOTRIN) 800 MG tablet Take 1 tablet (800 mg total) by mouth 3 (three) times daily. 90 tablet 2  . loratadine (CLARITIN) 10 MG tablet Take 1 tablet (10 mg total) by mouth daily. 30 tablet 3  . lovastatin (MEVACOR) 20 MG tablet Take 1 tablet (20 mg total) by mouth at bedtime. 90 tablet 1  . metFORMIN (GLUCOPHAGE-XR) 500 MG 24 hr tablet Take 1 tablet (500 mg total) by mouth daily with breakfast. 30 tablet 5  . metoprolol tartrate (LOPRESSOR) 25 MG tablet TAKE 1 TABLET BY MOUTH TWICE A DAY FOR BLOOD PRESSURE. 60 tablet 5  . promethazine (PHENERGAN) 25 MG tablet Take 1 tablet (25 mg total) by mouth every 6 (six) hours as needed for nausea or vomiting. 15 tablet 0  . ranitidine (ZANTAC) 150 MG tablet Take 1 tablet (150 mg total) by mouth 2  (two) times daily. 60 tablet 5   No current facility-administered medications for this visit.    The ankle has mild swelling, mild tenderness; rom is normal; stability is normal  She is awake and alert she is walking in the boot   3 views of the right ankle  The fracture is an avulsion fracture distal tip of the malleolus ankle mortise is intact  Impression stable avulsion fracture right ankle.   ASSESSMENT AND PLAN   Weight bearing as tolerated , wean from cam walker as tolerated , ankle exercises as instructed

## 2015-08-15 ENCOUNTER — Ambulatory Visit (INDEPENDENT_AMBULATORY_CARE_PROVIDER_SITE_OTHER): Payer: BLUE CROSS/BLUE SHIELD | Admitting: Orthopedic Surgery

## 2015-08-15 DIAGNOSIS — Z9889 Other specified postprocedural states: Secondary | ICD-10-CM

## 2015-08-15 NOTE — Progress Notes (Signed)
PATIENT IN TODAY FOR SUTURE REMOVAL RIGHT HAND/WRIST STATUS POST RIGHT CARPAL TUNNEL RELEASE, DOS 08/01/15 WOUND LOOKS GOOD, NO SIGNS OF INFECTION SUTURES REMOVED, STERI STRIPS APPLIED, PATIENT TOLERATED WELL PATIENT HAS FOLLOW UP APPOINTMENT ADVISED IF ANY SIGNS OF INFECTION CALL OFFICE

## 2015-08-26 ENCOUNTER — Telehealth: Payer: Self-pay | Admitting: *Deleted

## 2015-08-26 NOTE — Telephone Encounter (Signed)
Patient came in for paperwork and had a question regarding her wound (carpal Tunnel surgery 08/01/15). She was concerned about an odor she smelled and wanted to know about cleaning it with peroxide. I advised to gently clean with anti bacterial soap and water. Wound looked clean and closed with scab starting to form with no redness, odor, or drainage.I advised that odor could be coming from where she was keeping it covered with bandage and sweating. I offered to move up her post op appointment that is scheduled for 09/01/15 and patient declined. I advised if she noticed any opening, odor, drainage, or redness after cleaning to call the office for sooner appointment with Dr. Aline Brochure.

## 2015-09-01 ENCOUNTER — Ambulatory Visit (INDEPENDENT_AMBULATORY_CARE_PROVIDER_SITE_OTHER): Payer: BLUE CROSS/BLUE SHIELD | Admitting: Orthopedic Surgery

## 2015-09-01 ENCOUNTER — Encounter: Payer: Self-pay | Admitting: Orthopedic Surgery

## 2015-09-01 VITALS — BP 141/72 | Ht 66.5 in | Wt 250.0 lb

## 2015-09-01 DIAGNOSIS — G5601 Carpal tunnel syndrome, right upper limb: Secondary | ICD-10-CM

## 2015-09-01 NOTE — Progress Notes (Signed)
Postop carpal tunnel release  Chief Complaint  Patient presents with  . Follow-up    1 WEEK FOLLOW UP RIGHT ANKLE FX, DOI 07/14/15, RT CTR DOS 08/01/15   Right long finger still numb from the PIP joint distally ring finger from the DIP joint distally and she has weak opposition to the small finger  Recommend occupational therapy come back 3 weeks out of work 3 weeks

## 2015-09-01 NOTE — Patient Instructions (Signed)
Call hand and rehab to schedule therapy visits  Out of work 3 weeks

## 2015-09-10 ENCOUNTER — Encounter: Payer: Self-pay | Admitting: Orthopedic Surgery

## 2015-09-10 ENCOUNTER — Telehealth: Payer: Self-pay | Admitting: Orthopedic Surgery

## 2015-09-10 NOTE — Telephone Encounter (Signed)
Patient called to inquire about returning to work sooner than her next scheduled appointment, 09/23/15.  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  Please review and advise. [phone]

## 2015-09-10 NOTE — Telephone Encounter (Signed)
Called back to patient; notified.  Work note done as requested.

## 2015-09-10 NOTE — Telephone Encounter (Signed)
Yes proceed

## 2015-09-18 ENCOUNTER — Other Ambulatory Visit: Payer: Self-pay | Admitting: Orthopedic Surgery

## 2015-09-18 DIAGNOSIS — M25571 Pain in right ankle and joints of right foot: Secondary | ICD-10-CM

## 2015-09-23 ENCOUNTER — Ambulatory Visit: Payer: BLUE CROSS/BLUE SHIELD | Admitting: Orthopedic Surgery

## 2015-09-24 ENCOUNTER — Ambulatory Visit: Payer: BLUE CROSS/BLUE SHIELD | Admitting: Orthopedic Surgery

## 2015-09-24 VITALS — BP 130/76 | HR 78 | Ht 66.5 in | Wt 250.0 lb

## 2015-09-24 DIAGNOSIS — Z9889 Other specified postprocedural states: Secondary | ICD-10-CM

## 2015-09-24 DIAGNOSIS — S82891D Other fracture of right lower leg, subsequent encounter for closed fracture with routine healing: Secondary | ICD-10-CM

## 2015-09-24 NOTE — Progress Notes (Signed)
Chief Complaint  Patient presents with  . Follow-up    Right CTR DOS 08/01/15 and Right ankle fracture DOI 07/14/15    Ankle has returned to normal   Right Carpal tunnel release Incision has healed is no tenderness The patient has regained full range of motion  Negative Tinel's over the carpal tunnel  Released to normal activity

## 2015-09-30 ENCOUNTER — Encounter: Payer: Self-pay | Admitting: Orthopedic Surgery

## 2015-11-14 ENCOUNTER — Other Ambulatory Visit: Payer: Self-pay | Admitting: Family Medicine

## 2015-11-24 ENCOUNTER — Telehealth: Payer: Self-pay | Admitting: Family Medicine

## 2015-11-24 MED ORDER — BENZONATATE 100 MG PO CAPS: 100.0000 mg | ORAL_CAPSULE | Freq: Two times a day (BID) | ORAL | Status: DC | PRN

## 2015-11-24 NOTE — Telephone Encounter (Signed)
Patient is complaining of Cough, Congested, Runny Nose, spitting up yellow/green  Mucos, denies fever, Ears Stopped up, Slight Headache, Sneezing, Since Saturday, please advise?

## 2015-11-24 NOTE — Telephone Encounter (Signed)
Tessalon perles sent in, and she is aware, advised call back if worsens

## 2015-11-24 NOTE — Telephone Encounter (Signed)
Coughing with yellow mucus, nasal congestion, ears stopped up and sneezing, no fever---since Saturday. Wants something called in for it

## 2015-11-30 ENCOUNTER — Emergency Department (HOSPITAL_COMMUNITY): Payer: Self-pay

## 2015-11-30 ENCOUNTER — Emergency Department (HOSPITAL_COMMUNITY)
Admission: EM | Admit: 2015-11-30 | Discharge: 2015-11-30 | Disposition: A | Discharge status: Home / Self Care | Payer: Self-pay | Attending: Emergency Medicine | Admitting: Emergency Medicine

## 2015-11-30 ENCOUNTER — Encounter (HOSPITAL_COMMUNITY): Payer: Self-pay | Admitting: *Deleted

## 2015-11-30 DIAGNOSIS — Y999 Unspecified external cause status: Secondary | ICD-10-CM | POA: Insufficient documentation

## 2015-11-30 DIAGNOSIS — Y939 Activity, unspecified: Secondary | ICD-10-CM | POA: Insufficient documentation

## 2015-11-30 DIAGNOSIS — Z87891 Personal history of nicotine dependence: Secondary | ICD-10-CM | POA: Insufficient documentation

## 2015-11-30 DIAGNOSIS — S93401A Sprain of unspecified ligament of right ankle, initial encounter: Secondary | ICD-10-CM

## 2015-11-30 DIAGNOSIS — I10 Essential (primary) hypertension: Secondary | ICD-10-CM | POA: Insufficient documentation

## 2015-11-30 DIAGNOSIS — Z7984 Long term (current) use of oral hypoglycemic drugs: Secondary | ICD-10-CM | POA: Insufficient documentation

## 2015-11-30 DIAGNOSIS — X58XXXA Exposure to other specified factors, initial encounter: Secondary | ICD-10-CM | POA: Insufficient documentation

## 2015-11-30 DIAGNOSIS — F329 Major depressive disorder, single episode, unspecified: Secondary | ICD-10-CM | POA: Insufficient documentation

## 2015-11-30 DIAGNOSIS — Z791 Long term (current) use of non-steroidal anti-inflammatories (NSAID): Secondary | ICD-10-CM | POA: Insufficient documentation

## 2015-11-30 DIAGNOSIS — Y929 Unspecified place or not applicable: Secondary | ICD-10-CM | POA: Insufficient documentation

## 2015-11-30 MED ORDER — TRAMADOL HCL 50 MG PO TABS: 50.0000 mg | ORAL_TABLET | Freq: Four times a day (QID) | ORAL | Status: DC | PRN

## 2015-11-30 MED ORDER — TRAMADOL HCL 50 MG PO TABS
50.0000 mg | ORAL_TABLET | Freq: Once | ORAL | Status: AC
  Administered 2015-11-30: 50 mg via ORAL
  Filled 2015-11-30: qty 1

## 2015-11-30 NOTE — Discharge Instructions (Signed)
Ankle Sprain °An ankle sprain is an injury to the strong, fibrous tissues (ligaments) that hold your ankle bones together.  °HOME CARE  °· Put ice on your ankle for 1-2 days or as told by your doctor. °¨ Put ice in a plastic bag. °¨ Place a towel between your skin and the bag. °¨ Leave the ice on for 15-20 minutes at a time, every 2 hours while you are awake. °· Only take medicine as told by your doctor. °· Raise (elevate) your injured ankle above the level of your heart as much as possible for 2-3 days. °· Use crutches if your doctor tells you to. Slowly put your own weight on the affected ankle. Use the crutches until you can walk without pain. °· If you have a plaster splint: °¨ Do not rest it on anything harder than a pillow for 24 hours. °¨ Do not put weight on it. °¨ Do not get it wet. °¨ Take it off to shower or bathe. °· If given, use an elastic wrap or support stocking for support. Take the wrap off if your toes lose feeling (numb), tingle, or turn cold or blue. °· If you have an air splint: °¨ Add or let out air to make it comfortable. °¨ Take it off at night and to shower and bathe. °¨ Wiggle your toes and move your ankle up and down often while you are wearing it. °GET HELP IF: °· You have rapidly increasing bruising or puffiness (swelling). °· Your toes feel very cold. °· You lose feeling in your foot. °· Your medicine does not help your pain. °GET HELP RIGHT AWAY IF:  °· Your toes lose feeling (numb) or turn blue. °· You have severe pain that is increasing. °MAKE SURE YOU:  °· Understand these instructions. °· Will watch your condition. °· Will get help right away if you are not doing well or get worse. °  °This information is not intended to replace advice given to you by your health care provider. Make sure you discuss any questions you have with your health care provider. °  °Document Released: 12/08/2007 Document Revised: 07/12/2014 Document Reviewed: 01/03/2012 °Elsevier Interactive Patient  Education ©2016 [employer] ° °

## 2015-11-30 NOTE — ED Provider Notes (Signed)
CSN: [id number]     Arrival date & time 11/30/15  1949 History   First MD Initiated Contact with Patient 11/30/15 2057     Chief Complaint  Patient presents with  . Ankle Pain     (Consider location/radiation/quality/duration/timing/severity/associated sxs/prior Treatment) HPI   Julie Ryan is a 50 y.o. female who presents to the Emergency Department complaining of recurrent right ankle pain today.  She reports chronic swelling to her ankle since she "chipped a bone" in her ankle in January.  She was seen by Dr. Aline Brochure at that time and wore a cam walker for several weeks.  She states the pain to her ankle has been worse today.  She is unsure, but believes she may have twisted her ankle.  She denies numbness or weakness of the extremity, open wounds, redness, and fever.  She has not taken anything for the pain.     Past Medical History  Diagnosis Date  . Anxiety   . Depression   . Obesity   . Hypertension   . Prediabetes   . Carpal tunnel syndrome of right wrist   . Chronic knee pain    Past Surgical History  Procedure Laterality Date  . Tubal ligation    . Appendectomy    . Esophagogastroduodenoscopy N/A 05/25/2013    Procedure: ESOPHAGOGASTRODUODENOSCOPY (EGD);  Surgeon: Rogene Houston, MD;  Location: [facility];  Service: Endoscopy;  Laterality: N/A;  220  . Carpal tunnel release Right 08/01/2015    Procedure: RIGHT CARPAL TUNNEL RELEASE;  Surgeon: Carole Civil, MD;  Location: [facility];  Service: Orthopedics;  Laterality: Right;   Family History  Problem Relation Age of Onset  . Hypertension Mother   . Heart disease Mother   . Diabetes Mother   . Diabetes Sister   . Multiple sclerosis Sister   . Lupus Sister   . Heart disease Sister   . CVA Sister   . Diabetes Brother   . Hypertension Brother   . Hypertension Daughter    Social History  Substance Use Topics  . Smoking status: Former Smoker -- 0.25 packs/day    Types: Cigarettes    Quit date:  03/31/2015  . Smokeless tobacco: Never Used     Comment: smokes occasionally   . Alcohol Use: No   OB History    No data available     Review of Systems  Constitutional: Negative for fever and chills.  Musculoskeletal: Positive for joint swelling and arthralgias (right lateral ankle).  Skin: Negative for color change and wound.  Neurological: Negative for dizziness, weakness and numbness.  All other systems reviewed and are negative.     Allergies  Codeine; Effexor xr; Fluoxetine; Hydrocodone; and Latex  Home Medications   Prior to Admission medications   Medication Sig Start Date End Date Taking? Authorizing Provider  benzonatate (TESSALON) 100 MG capsule Take 1 capsule (100 mg total) by mouth 2 (two) times daily as needed for cough. 11/24/15   Fayrene Helper, MD  furosemide (LASIX) 40 MG tablet Take 1 tablet (40 mg total) by mouth daily. 06/10/15   Fayrene Helper, MD  gabapentin (NEURONTIN) 300 MG capsule Take 1 capsule (300 mg total) by mouth at bedtime. 06/10/15   Fayrene Helper, MD  HYDROmorphone (DILAUDID) 2 MG tablet Take 1 tablet (2 mg total) by mouth every 4 (four) hours as needed for severe pain. 08/01/15   Carole Civil, MD  ibuprofen (ADVIL,MOTRIN) 800 MG tablet Take 1 tablet (  800 mg total) by mouth 3 (three) times daily. 08/01/15   Carole Civil, MD  loratadine (CLARITIN) 10 MG tablet Take 1 tablet (10 mg total) by mouth daily. 10/11/14   Fayrene Helper, MD  lovastatin (MEVACOR) 20 MG tablet TAKE ONE TABLET BY MOUTH AT BEDTIME 08/12/15   Fayrene Helper, MD  lovastatin (MEVACOR) 20 MG tablet TAKE TWO TABLETS BY MOUTH ONCE DAILY AT BEDTIME 11/14/15   Fayrene Helper, MD  metFORMIN (GLUCOPHAGE-XR) 500 MG 24 hr tablet Take 1 tablet (500 mg total) by mouth daily with breakfast. 06/10/15   Fayrene Helper, MD  metoprolol tartrate (LOPRESSOR) 25 MG tablet TAKE 1 TABLET BY MOUTH TWICE A DAY FOR BLOOD PRESSURE. 06/10/15   Fayrene Helper, MD   promethazine (PHENERGAN) 25 MG tablet Take 1 tablet (25 mg total) by mouth every 6 (six) hours as needed for nausea or vomiting. 07/14/15   Lily Kocher, PA-C  ranitidine (ZANTAC) 150 MG tablet Take 1 tablet (150 mg total) by mouth 2 (two) times daily. 06/10/15   Fayrene Helper, MD   BP 129/59 mmHg  Pulse 76  Temp(Src) 98.7 F (37.1 C) (Oral)  Resp 16  Ht 5\' 6"  (1.676 m)  Wt 108.863 kg  BMI 38.76 kg/m2  SpO2 100%  LMP 11/23/2015 Physical Exam  Constitutional: She is oriented to person, place, and time. She appears well-developed and well-nourished. No distress.  HENT:  Head: Normocephalic and atraumatic.  Cardiovascular: Normal rate, regular rhythm and intact distal pulses.   Pulmonary/Chest: Effort normal and breath sounds normal.  Musculoskeletal: She exhibits tenderness.  ttp of the lateral right ankle.  Moderate edema.   DP pulse is brisk,distal sensation intact.  No erythema,  bruising or bony deformity.  No proximal tenderness.  Compartments soft  Neurological: She is alert and oriented to person, place, and time. She exhibits normal muscle tone. Coordination normal.  Skin: Skin is warm and dry.  Nursing note and vitals reviewed.   ED Course  Procedures (including critical care time) Labs Review Labs Reviewed - No data to display  Imaging Review Dg Ankle Complete Right  11/30/2015  CLINICAL DATA:  Acute onset of right ankle pain pain and chronic swelling. Initial encounter. EXAM: RIGHT ANKLE - COMPLETE 3+ VIEW COMPARISON:  None. FINDINGS: There is no evidence of fracture or dislocation. A chronic osseous fragment is noted adjacent to the distal fibula. The ankle mortise is intact; the interosseous space is within normal limits. No talar tilt or subluxation is seen. The joint spaces are preserved. An ankle joint effusion is suggested. Diffuse soft tissue swelling is noted. IMPRESSION: 1. No evidence of fracture or dislocation. 2. Suggestion of ankle joint effusion.  Electronically Signed   By: Garald Balding M.D.   On: 11/30/2015 21:12   I have personally reviewed and evaluated these images and lab results as part of my medical decision-making.   EKG Interpretation None      MDM   Final diagnoses:  Ankle sprain, right, initial encounter    Recurrent pain to the lateral right ankle.  Has seen Dr. Aline Brochure earlier this year after an avulsion injury of the ankle.  Has cam walker at home and agrees to wear.  Agrees to close orthopedic f/u if needed. RICE therapy.  NV intact.      Kem Parkinson, PA-C 11/30/15 2135  Fredia Sorrow, MD 12/03/15 1650

## 2015-11-30 NOTE — ED Notes (Signed)
Pt c/o right ankle pain, states that she chipped a bone in her right ankle at the beginning of the year, pain returned today, has swelling in bilateral feet and ankles worse on the right but pt states that the swelling is normal for her.

## 2015-12-02 ENCOUNTER — Telehealth: Payer: Self-pay | Admitting: Family Medicine

## 2015-12-02 MED ORDER — IBUPROFEN 800 MG PO TABS: 800.0000 mg | ORAL_TABLET | Freq: Three times a day (TID) | ORAL | Status: DC | PRN

## 2015-12-02 NOTE — Telephone Encounter (Signed)
Review of eD note recommends f/u with Dr Aline Brochure / ortho if needed, referral entered, in the interim I have sent ibuprofen 800 mg for pain relief

## 2015-12-02 NOTE — Telephone Encounter (Signed)
Called and left message for patient notifying that medication has been sent for her and requesting that she call back the office on 5/31 am

## 2015-12-02 NOTE — Telephone Encounter (Signed)
Julie Ryan was in the ER Sunday with injury to right ankle, they gave her tramadol and its not helping, she is asking if Dr. Moshe Cipro could give her something else for the pain and swelling, please advise?

## 2015-12-03 NOTE — Telephone Encounter (Signed)
Patient aware.

## 2015-12-03 NOTE — Telephone Encounter (Signed)
Called and left message for patient to return call.  

## 2015-12-10 ENCOUNTER — Ambulatory Visit: Payer: BLUE CROSS/BLUE SHIELD | Admitting: Family Medicine

## 2015-12-16 LAB — HEMOGLOBIN A1C
Hgb A1c MFr Bld: 5.9 % — ABNORMAL HIGH (ref <5.7)
Mean Plasma Glucose: 123 mg/dL

## 2015-12-17 ENCOUNTER — Ambulatory Visit (INDEPENDENT_AMBULATORY_CARE_PROVIDER_SITE_OTHER): Payer: Self-pay | Admitting: Family Medicine

## 2015-12-17 ENCOUNTER — Encounter: Payer: Self-pay | Admitting: Family Medicine

## 2015-12-17 VITALS — BP 138/80 | HR 74 | Resp 16 | Ht 66.0 in | Wt 245.1 lb

## 2015-12-17 DIAGNOSIS — M25571 Pain in right ankle and joints of right foot: Secondary | ICD-10-CM | POA: Insufficient documentation

## 2015-12-17 DIAGNOSIS — M79641 Pain in right hand: Secondary | ICD-10-CM

## 2015-12-17 DIAGNOSIS — I10 Essential (primary) hypertension: Secondary | ICD-10-CM

## 2015-12-17 MED ORDER — TRAMADOL HCL 50 MG PO TABS: ORAL_TABLET | ORAL | Status: DC

## 2015-12-17 MED ORDER — METOPROLOL TARTRATE 25 MG PO TABS: ORAL_TABLET | ORAL | Status: DC

## 2015-12-17 MED ORDER — PHENTERMINE HCL 37.5 MG PO TABS: 37.5000 mg | ORAL_TABLET | Freq: Every day | ORAL | Status: DC

## 2015-12-17 MED ORDER — RANITIDINE HCL 150 MG PO TABS: 150.0000 mg | ORAL_TABLET | Freq: Two times a day (BID) | ORAL | Status: DC

## 2015-12-17 MED ORDER — GABAPENTIN 300 MG PO CAPS: 300.0000 mg | ORAL_CAPSULE | Freq: Every day | ORAL | Status: DC

## 2015-12-17 NOTE — Progress Notes (Signed)
Subjective:    Patient ID: Julie Ryan, female    DOB: [birth year], 50 y.o.   MRN: [id number]  HPI   ZINEB ZUNK     MRN: [id number]      DOB: 1966/06/19   HPI Ms. Draudt is here for follow up and re-evaluation of chronic medical conditions, medication management and review of any available recent lab and radiology data.  Preventive health is updated, specifically  Cancer screening and Immunization.   Questions or concerns regarding consultations or procedures which the PT has had in the interim are  addressed. The PT denies any adverse reactions to current medications since the last visit.  2 week  H/o increased and uncontrolled ankle pain, no aggravating trauma noted, requests pain management C/o weight  Gain, requests short term appetite suppressant which she has benefited from in the past, intends to work with the med ROS Denies recent fever or chills. Denies sinus pressure, nasal congestion, ear pain or sore throat. Denies chest congestion, productive cough or wheezing. Denies chest pains, palpitations and leg swelling Denies abdominal pain, nausea, vomiting,diarrhea or constipation.   Denies dysuria, frequency, hesitancy or incontinence.  Denies headaches, seizures, numbness, or tingling. Denies depression, anxiety or insomnia. Denies skin break down or rash.   PE  BP 138/80 mmHg  Pulse 74  Resp 16  Ht 5\' 6"  (1.676 m)  Wt 245 lb 1.9 oz (111.186 kg)  BMI 39.58 kg/m2  SpO2 97%  LMP 11/23/2015  Patient alert and oriented and in no cardiopulmonary distress.  HEENT: No facial asymmetry, EOMI,   oropharynx pink and moist.  Neck supple no JVD, no mass.  Chest: Clear to auscultation bilaterally.  CVS: S1, S2 no murmurs, no S3.Regular rate.  ABD: Soft non tender.   Ext: No edema  MS: Adequate ROM spine, shoulders, hips and knees.Decreased ROM right ankle with tenderness and swelling Bilateral carpal tunnel syndrome  Skin: Intact, no ulcerations or rash  noted.  Psych: Good eye contact, normal affect. Memory intact not anxious or depressed appearing.  CNS: CN 2-12 intact, power,  normal throughout.no focal deficits noted.   Assessment & Plan   Right ankle pain Weight loss, add tramadol for as needed, daily use  Essential hypertension Controlled, no change in medication DASH diet and commitment to daily physical activity for a minimum of 30 minutes discussed and encouraged, as a part of hypertension management. The importance of attaining a healthy weight is also discussed.  BP/Weight 12/17/2015 11/30/2015 09/24/2015 09/01/2015 08/12/2015 08/08/2015 0000000  Systolic BP 0000000 [id number] [id number] [id number] [id number] 123456 Q000111Q  Diastolic BP 80 59 76 72 72 91 82  Wt. (Lbs) 245.12 240 250 250 250 250 250  BMI 39.58 38.76 39.75 39.75 39.75 39.75 40.37   Start half phentermine daily, 10 pound weight loss goal for next 4 months    MORBID OBESITY Deteriorated. Patient re-educated about  the importance of commitment to a  minimum of 150 minutes of exercise per week.  The importance of healthy food choices with portion control discussed. Encouraged to start a food diary, count calories and to consider  joining a support group. Sample diet sheets offered. Goals set by the patient for the next several months.   Weight /BMI 12/17/2015 11/30/2015 09/24/2015  WEIGHT 245 lb 1.9 oz 240 lb 250 lb  HEIGHT 5\' 6"  5\' 6"  5' 6.5"  BMI 39.58 kg/m2 38.76 kg/m2 39.75 kg/m2    Current exercise per week 30 minutes.  Review of Systems     Objective:   Physical Exam        Assessment & Plan:

## 2015-12-17 NOTE — Patient Instructions (Signed)
Physical exam in 4.5 month, call if you need me before  Tramadol and phentermine half tablet dail with 10 pound weight loss goal  Please work on good  health habits so that your health will improve. 1. Commitment to daily physical activity for 30 to 60  minutes, if you are able to do this.  2. Commitment to wise food choices. Aim for half of your  food intake to be vegetable and fruit, one quarter starchy foods, and one quarter protein. Try to eat on a regular schedule  3 meals per day, snacking between meals should be limited to vegetables or fruits or small portions of nuts. 64 ounces of water per day is generally recommended, unless you have specific health conditions, like heart failure or kidney failure where you will need to limit fluid intake.  3. Commitment to sufficient and a  good quality of physical and mental rest daily, generally between 6 to 8 hours per day.  WITH PERSISTANCE AND PERSEVERANCE, THE IMPOSSIBLE , BECOMES THE NORM!

## 2015-12-21 NOTE — Assessment & Plan Note (Signed)
Controlled, no change in medication DASH diet and commitment to daily physical activity for a minimum of 30 minutes discussed and encouraged, as a part of hypertension management. The importance of attaining a healthy weight is also discussed.  BP/Weight 12/17/2015 11/30/2015 09/24/2015 09/01/2015 08/12/2015 08/08/2015 0000000  Systolic BP 0000000 [id number] [id number] 123456 123456 Q000111Q  Diastolic BP 80 59 76 72 72 91 82  Wt. (Lbs) 245.12 240 250 250 250 250 250  BMI 39.58 38.76 39.75 39.75 39.75 39.75 40.37   Start half phentermine daily, 10 pound weight loss goal for next 4 months

## 2015-12-21 NOTE — Assessment & Plan Note (Signed)
Deteriorated. Patient re-educated about  the importance of commitment to a  minimum of 150 minutes of exercise per week.  The importance of healthy food choices with portion control discussed. Encouraged to start a food diary, count calories and to consider  joining a support group. Sample diet sheets offered. Goals set by the patient for the next several months.   Weight /BMI 12/17/2015 11/30/2015 09/24/2015  WEIGHT 245 lb 1.9 oz 240 lb 250 lb  HEIGHT 5\' 6"  5\' 6"  5' 6.5"  BMI 39.58 kg/m2 38.76 kg/m2 39.75 kg/m2    Current exercise per week 30 minutes.

## 2015-12-21 NOTE — Assessment & Plan Note (Signed)
Weight loss, add tramadol for as needed, daily use

## 2016-01-07 ENCOUNTER — Other Ambulatory Visit: Payer: Self-pay | Admitting: Family Medicine

## 2016-03-04 ENCOUNTER — Other Ambulatory Visit: Payer: Self-pay | Admitting: Family Medicine

## 2016-03-04 DIAGNOSIS — M25571 Pain in right ankle and joints of right foot: Secondary | ICD-10-CM

## 2016-03-15 ENCOUNTER — Ambulatory Visit (INDEPENDENT_AMBULATORY_CARE_PROVIDER_SITE_OTHER): Payer: BC Managed Care – PPO

## 2016-03-15 DIAGNOSIS — Z111 Encounter for screening for respiratory tuberculosis: Secondary | ICD-10-CM | POA: Diagnosis not present

## 2016-03-17 ENCOUNTER — Other Ambulatory Visit: Payer: Self-pay

## 2016-03-17 ENCOUNTER — Ambulatory Visit (INDEPENDENT_AMBULATORY_CARE_PROVIDER_SITE_OTHER): Payer: BC Managed Care – PPO | Admitting: Podiatry

## 2016-03-17 ENCOUNTER — Telehealth: Payer: Self-pay | Admitting: Family Medicine

## 2016-03-17 ENCOUNTER — Encounter: Payer: Self-pay | Admitting: Podiatry

## 2016-03-17 ENCOUNTER — Ambulatory Visit (INDEPENDENT_AMBULATORY_CARE_PROVIDER_SITE_OTHER): Payer: BC Managed Care – PPO

## 2016-03-17 VITALS — BP 129/77 | HR 73

## 2016-03-17 DIAGNOSIS — M25571 Pain in right ankle and joints of right foot: Secondary | ICD-10-CM

## 2016-03-17 DIAGNOSIS — M659 Synovitis and tenosynovitis, unspecified: Secondary | ICD-10-CM

## 2016-03-17 DIAGNOSIS — M25572 Pain in left ankle and joints of left foot: Secondary | ICD-10-CM | POA: Diagnosis not present

## 2016-03-17 DIAGNOSIS — M2141 Flat foot [pes planus] (acquired), right foot: Secondary | ICD-10-CM

## 2016-03-17 DIAGNOSIS — M65979 Unspecified synovitis and tenosynovitis, unspecified ankle and foot: Secondary | ICD-10-CM

## 2016-03-17 DIAGNOSIS — M722 Plantar fascial fibromatosis: Secondary | ICD-10-CM

## 2016-03-17 DIAGNOSIS — M2142 Flat foot [pes planus] (acquired), left foot: Secondary | ICD-10-CM | POA: Diagnosis not present

## 2016-03-17 LAB — TB SKIN TEST
Induration: 0 mm
TB Skin Test: NEGATIVE

## 2016-03-17 MED ORDER — IBUPROFEN 800 MG PO TABS: 800.0000 mg | ORAL_TABLET | ORAL | 0 refills | Status: DC | PRN

## 2016-03-17 NOTE — Telephone Encounter (Signed)
Med sent and directions updated

## 2016-03-17 NOTE — Progress Notes (Signed)
   Subjective:    Patient ID: Julie Ryan, female    DOB: 05/09/1966, 50 y.o.   MRN: [id number]  HPI    Review of Systems  Constitutional: Positive for fatigue.  Cardiovascular: Positive for leg swelling.  Allergic/Immunologic: Positive for environmental allergies.       Objective:   Physical Exam        Assessment & Plan:

## 2016-03-17 NOTE — Telephone Encounter (Signed)
Julie Ryan is asking for a med refill on her ibuprofen (ADVIL,MOTRIN) 800 MG tablet  Please advise?

## 2016-03-18 ENCOUNTER — Other Ambulatory Visit: Payer: Self-pay

## 2016-03-18 ENCOUNTER — Telehealth: Payer: Self-pay | Admitting: Podiatry

## 2016-03-18 DIAGNOSIS — M25571 Pain in right ankle and joints of right foot: Secondary | ICD-10-CM

## 2016-03-18 MED ORDER — METHYLPREDNISOLONE 4 MG PO TBPK: ORAL_TABLET | ORAL | 0 refills | Status: DC

## 2016-03-18 MED ORDER — DICLOFENAC SODIUM 75 MG PO TBEC: 75.0000 mg | DELAYED_RELEASE_TABLET | Freq: Two times a day (BID) | ORAL | 0 refills | Status: DC

## 2016-03-18 MED ORDER — BETAMETHASONE SOD PHOS & ACET 6 (3-3) MG/ML IJ SUSP: 12.0000 mg | Freq: Once | INTRAMUSCULAR | Status: DC

## 2016-03-18 NOTE — Progress Notes (Signed)
error 

## 2016-03-18 NOTE — Progress Notes (Signed)
Patient ID: Julie Ryan, female   DOB: 07-15-1965, 50 y.o.   MRN: [id number] Subjective: Patient presents today with severe bilateral ankle and foot pain. Patient states that back in January she had an injury to her right lower extremity. Today her left ankle hurts more than her right. Patient presents today for further treatment and evaluation   Objective: Physical Exam General: The patient is alert and oriented x3 in no acute distress.  Dermatology: Skin is warm, dry and supple bilateral lower extremities. Negative for open lesions or macerations.  Vascular: Palpable pedal pulses bilaterally. No edema or erythema noted. Capillary refill within normal limits.  Neurological: Epicritic and protective threshold grossly intact bilaterally.   Musculoskeletal Exam: Severe pain on palpation to the bilateral ankles both medial lateral and anterior aspects. Excellent patient has a clinical flatfoot deformity bilaterally. Also evident on radiographic exam  Range of motion within normal limits to all pedal and ankle joints bilateral. Muscle strength 5/5 in all groups bilateral.   Radiographic Exam:  Visible flatfoot deformity noted bilaterally.  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   Assessment: 1. Ankle joint synovitis bilateral 2. Pes planus deformity bilateral 3. Plantar fasciitis bilateral 4. Pain in bilateral feet and ankles  Problem List Items Addressed This Visit    None    Visit Diagnoses    Ankle pain, right    -  Primary   Relevant Medications   betamethasone acetate-betamethasone sodium phosphate (CELESTONE) injection 12 mg   Other Relevant Orders   DG Ankle Complete Right   Ankle pain, left       Relevant Medications   betamethasone acetate-betamethasone sodium phosphate (CELESTONE) injection 12 mg   Other Relevant Orders   DG Ankle Complete Left   Synovitis of ankle       bilateral    Relevant  Medications   methylPREDNISolone (MEDROL DOSEPAK) 4 MG TBPK tablet   diclofenac (VOLTAREN) 75 MG EC tablet   betamethasone acetate-betamethasone sodium phosphate (CELESTONE) injection 12 mg        Plan of Care:  #1 Patient was evaluated. #2 Injection of 0.5cc celestone soluspan injected into the bilateral ankle joints.  #3 plantar fascial braces were dispensed the patient for bilateral ambulation due to the severe foot and ankle pain. #4 patient was scanned today for custom molded orthotics. #5 prescriptions for Medrol Dosepak and diclofenac 75 #60 twice a day were dispensed. #7 patient is to return to clinic in 3 weeks for custom orthotic dispensable and follow-up evaluation.     Dr. Edrick Kins, [place]

## 2016-03-18 NOTE — Telephone Encounter (Signed)
Patient called wanting to know if the Rx has been called into the [facility] in [place]?

## 2016-03-19 NOTE — Telephone Encounter (Signed)
Yes, Rx for Medrol Dose Pack and Diclofenac have been sent to [facility] in [place].

## 2016-03-19 NOTE — Telephone Encounter (Signed)
Called pt and left a voicemail to let her know her prescriptions have been sent to the [facility] in [place].

## 2016-04-05 ENCOUNTER — Ambulatory Visit (INDEPENDENT_AMBULATORY_CARE_PROVIDER_SITE_OTHER): Payer: BC Managed Care – PPO | Admitting: Podiatry

## 2016-04-05 DIAGNOSIS — M25571 Pain in right ankle and joints of right foot: Secondary | ICD-10-CM

## 2016-04-05 DIAGNOSIS — M25572 Pain in left ankle and joints of left foot: Secondary | ICD-10-CM

## 2016-04-05 DIAGNOSIS — M7751 Other enthesopathy of right foot: Secondary | ICD-10-CM | POA: Diagnosis not present

## 2016-04-05 DIAGNOSIS — M722 Plantar fascial fibromatosis: Secondary | ICD-10-CM

## 2016-04-05 DIAGNOSIS — M659 Synovitis and tenosynovitis, unspecified: Secondary | ICD-10-CM

## 2016-04-05 DIAGNOSIS — M7752 Other enthesopathy of left foot: Secondary | ICD-10-CM

## 2016-04-05 MED ORDER — NONFORMULARY OR COMPOUNDED ITEM: 1.0000 g | Freq: Four times a day (QID) | 2 refills | Status: DC

## 2016-04-05 NOTE — Addendum Note (Signed)
Addended by: Johnnye Lana A on: 04/05/2016 11:32 AM   Modules accepted: Orders

## 2016-04-05 NOTE — Patient Instructions (Signed)

## 2016-04-05 NOTE — Progress Notes (Signed)
Subjective:  Patient presents today for follow-up evaluation of bilateral ankle and plantar fascial pain. Patient states that the plantar fascial bands helped for the first few weeks, however the begin irritating her feet. Patient states that the injections helped tremendously into her bilateral ankle joints.   Patient presents today for pain and tenderness to the bilateral ankles which has been ongoing now for the past few days. Patient denies trauma.   Patient presents for further treatment and evaluation.    Objective / Physical Exam:  General:  The patient is alert and oriented x3 in no acute distress.  Dermatology:  Skin is warm, dry and supple bilateral lower extremities. Negative for open lesions or macerations.  Vascular:  Palpable pedal pulses bilaterally. No edema or erythema noted. Capillary refill within normal limits.  Neurological:  Epicritic and protective threshold grossly intact bilaterally.   Musculoskeletal Exam:  Pain on palpation to the anterior lateral medial aspects of the patient's bilateral ankle joints. Mild edema noted.  Range of motion within normal limits to all pedal and ankle joints bilateral. Muscle strength 5/5 in all groups bilateral.   Assessment: #1 pain in bilateral ankles #2 synovitis of bilateral ankles #3 capsulitis of bilateral ankles   Plan of Care:  #1 Patient was evaluated. #2 today injection of 0.5 mL Celestone Soluspan injected in the patient's bilateral ankle joints. #3 patient received her orthotics with instructions for break-in period #4 dispensed ankle braces to the bilateral lower extremities for ankle support. #5 recommend continuing diclofenac anti-inflammatory. #6 Prescription for anti-inflammatory pain cream dispensed through [facility]. #7 patient is to return to clinic in 4 weeks   Dr. Edrick Kins, [place]

## 2016-04-29 ENCOUNTER — Ambulatory Visit: Payer: BC Managed Care – PPO | Admitting: Podiatry

## 2016-05-03 ENCOUNTER — Ambulatory Visit: Payer: BC Managed Care – PPO | Admitting: Podiatry

## 2016-05-06 ENCOUNTER — Other Ambulatory Visit: Payer: Self-pay | Admitting: Family Medicine

## 2016-05-07 ENCOUNTER — Other Ambulatory Visit: Payer: Self-pay | Admitting: Family Medicine

## 2016-05-10 ENCOUNTER — Ambulatory Visit (INDEPENDENT_AMBULATORY_CARE_PROVIDER_SITE_OTHER): Payer: Self-pay | Admitting: Podiatry

## 2016-05-10 DIAGNOSIS — M25571 Pain in right ankle and joints of right foot: Secondary | ICD-10-CM

## 2016-05-10 DIAGNOSIS — M722 Plantar fascial fibromatosis: Secondary | ICD-10-CM

## 2016-05-10 DIAGNOSIS — M7752 Other enthesopathy of left foot: Secondary | ICD-10-CM | POA: Diagnosis not present

## 2016-05-10 DIAGNOSIS — M659 Synovitis and tenosynovitis, unspecified: Secondary | ICD-10-CM | POA: Diagnosis not present

## 2016-05-10 DIAGNOSIS — M7751 Other enthesopathy of right foot: Secondary | ICD-10-CM | POA: Diagnosis not present

## 2016-05-10 DIAGNOSIS — M25572 Pain in left ankle and joints of left foot: Secondary | ICD-10-CM

## 2016-05-10 MED ORDER — MELOXICAM 15 MG PO TABS: 15.0000 mg | ORAL_TABLET | Freq: Every day | ORAL | 1 refills | Status: AC

## 2016-05-15 ENCOUNTER — Other Ambulatory Visit: Payer: Self-pay | Admitting: Family Medicine

## 2016-05-15 MED ORDER — BETAMETHASONE SOD PHOS & ACET 6 (3-3) MG/ML IJ SUSP: 3.0000 mg | Freq: Once | INTRAMUSCULAR | Status: DC

## 2016-05-15 NOTE — Progress Notes (Signed)
Subjective:  Patient presents today for follow-up evaluation of bilateral ankle and plantar fascial pain. Patient states that the plantar fascial bands helped for the first few weeks, however the begin irritating her feet. Patient states that the injections helped tremendously into her bilateral ankle joints.   Patient presents today for pain and tenderness to the bilateral ankles which has been ongoing now for the past few days. Patient denies trauma.   Patient presents for further treatment and evaluation.    Objective / Physical Exam:  General:  The patient is alert and oriented x3 in no acute distress.  Dermatology:  Skin is warm, dry and supple bilateral lower extremities. Negative for open lesions or macerations.  Vascular:  Palpable pedal pulses bilaterally. No edema or erythema noted. Capillary refill within normal limits.  Neurological:  Epicritic and protective threshold grossly intact bilaterally.   Musculoskeletal Exam:  Pain on palpation to the anterior lateral medial aspects of the patient's bilateral ankle joints. Mild edema noted.  Range of motion within normal limits to all pedal and ankle joints bilateral. Muscle strength 5/5 in all groups bilateral.   Assessment: #1 pain in bilateral ankles #2 synovitis of bilateral ankles #3 capsulitis of bilateral ankles   Plan of Care:  #1 Patient was evaluated. #2 today injection of 0.5 mL Celestone Soluspan injected in the patient's bilateral ankle joints. #3 continue wearing custom molded orthotics  #4 continue ankle braces bilateral lower extremities for ankle support #5 prescription for meloxicam 15 mg #6 continue anti-inflammatory pain cream through [facility]  #7 compression anklet sleeves were dispensed today 2 #8 return to clinic in 4 weeks   Dr. Edrick Kins, [place]

## 2016-06-01 ENCOUNTER — Encounter: Payer: Self-pay | Admitting: Family Medicine

## 2016-06-01 ENCOUNTER — Ambulatory Visit (INDEPENDENT_AMBULATORY_CARE_PROVIDER_SITE_OTHER): Payer: BC Managed Care – PPO | Admitting: Family Medicine

## 2016-06-01 VITALS — BP 136/84 | HR 71 | Resp 16 | Ht 66.0 in | Wt 251.0 lb

## 2016-06-01 DIAGNOSIS — E8881 Metabolic syndrome: Secondary | ICD-10-CM

## 2016-06-01 DIAGNOSIS — I10 Essential (primary) hypertension: Secondary | ICD-10-CM | POA: Diagnosis not present

## 2016-06-01 DIAGNOSIS — Z23 Encounter for immunization: Secondary | ICD-10-CM | POA: Diagnosis not present

## 2016-06-01 DIAGNOSIS — R0683 Snoring: Secondary | ICD-10-CM

## 2016-06-01 DIAGNOSIS — R7303 Prediabetes: Secondary | ICD-10-CM

## 2016-06-01 DIAGNOSIS — E559 Vitamin D deficiency, unspecified: Secondary | ICD-10-CM

## 2016-06-01 DIAGNOSIS — F411 Generalized anxiety disorder: Secondary | ICD-10-CM | POA: Diagnosis not present

## 2016-06-01 DIAGNOSIS — E785 Hyperlipidemia, unspecified: Secondary | ICD-10-CM | POA: Diagnosis not present

## 2016-06-01 LAB — COMPREHENSIVE METABOLIC PANEL
ALT: 10 U/L (ref 6–29)
AST: 13 U/L (ref 10–35)
Albumin: 3.7 g/dL (ref 3.6–5.1)
Alkaline Phosphatase: 87 U/L (ref 33–130)
BILIRUBIN TOTAL: 0.4 mg/dL (ref 0.2–1.2)
BUN: 13 mg/dL (ref 7–25)
CALCIUM: 9.3 mg/dL (ref 8.6–10.4)
CHLORIDE: 104 mmol/L (ref 98–110)
CO2: 29 mmol/L (ref 20–31)
CREATININE: 0.99 mg/dL (ref 0.50–1.05)
GLUCOSE: 93 mg/dL (ref 65–99)
Potassium: 4.3 mmol/L (ref 3.5–5.3)
SODIUM: 139 mmol/L (ref 135–146)
Total Protein: 6.9 g/dL (ref 6.1–8.1)

## 2016-06-01 LAB — LIPID PANEL
CHOL/HDL RATIO: 2.7 ratio (ref <5.0)
Cholesterol: 163 mg/dL (ref <200)
HDL: 61 mg/dL (ref >50)
LDL Cholesterol: 85 mg/dL (ref <100)
Triglycerides: 83 mg/dL (ref <150)
VLDL: 17 mg/dL (ref <30)

## 2016-06-01 MED ORDER — TEMAZEPAM 15 MG PO CAPS: 15.0000 mg | ORAL_CAPSULE | Freq: Every day | ORAL | 2 refills | Status: DC

## 2016-06-01 MED ORDER — FLUTICASONE PROPIONATE 50 MCG/ACT NA SUSP: 2.0000 | Freq: Every day | NASAL | 6 refills | Status: DC

## 2016-06-01 MED ORDER — ERGOCALCIFEROL 1.25 MG (50000 UT) PO CAPS: 50000.0000 [IU] | ORAL_CAPSULE | ORAL | 1 refills | Status: DC

## 2016-06-01 MED ORDER — BUSPIRONE HCL 5 MG PO TABS: 5.0000 mg | ORAL_TABLET | Freq: Three times a day (TID) | ORAL | 2 refills | Status: DC

## 2016-06-01 MED ORDER — LORATADINE 10 MG PO TABS: 10.0000 mg | ORAL_TABLET | Freq: Every day | ORAL | 11 refills | Status: DC

## 2016-06-01 NOTE — Patient Instructions (Addendum)
Annual physical exam in 2 months, call if you need me sooner  Labs today  Flu vaccine today  You are referred for sleep study to Dr Merlene Laughter  New for allergies is daily loratidine and flonase  New for anxiety is buspar  New for vit D deficiency is once weekly vitamin D  CONTROL FOOD ENVIRONMENT to facilitate improved health and weight loss  It is important that you exercise regularly at least 30 minutes 5 times a week. If you develop chest pain, have severe difficulty breathing, or feel very tired, stop exercising immediately and seek medical attention    Thank you  for choosing [facility]. We consider it a privelige to serve you.  Delivering excellent health care in a caring and  compassionate way is our goal.  Partnering with you,  so that together we can achieve this goal is our strategy.

## 2016-06-01 NOTE — Assessment & Plan Note (Addendum)
Score of 21 in 05/2016, untreated and uncontrolled , start buspar and behavior modification discussed

## 2016-06-02 LAB — HEMOGLOBIN A1C
Hgb A1c MFr Bld: 5.7 % — ABNORMAL HIGH (ref <5.7)
Mean Plasma Glucose: 117 mg/dL

## 2016-06-02 LAB — VITAMIN D 25 HYDROXY (VIT D DEFICIENCY, FRACTURES): VIT D 25 HYDROXY: 14 ng/mL — AB (ref 30–100)

## 2016-06-07 ENCOUNTER — Ambulatory Visit: Payer: BC Managed Care – PPO | Admitting: Podiatry

## 2016-06-13 NOTE — Assessment & Plan Note (Signed)
Patient educated about the importance of limiting  Carbohydrate intake , the need to commit to daily physical activity for a minimum of 30 minutes , and to commit weight loss. The fact that changes in all these areas will reduce or eliminate all together the development of diabetes is stressed.  Improved  Diabetic Labs Latest Ref Rng & Units 06/01/2016 12/16/2015 07/30/2015 12/27/2014 07/10/2014  HbA1c <5.7 % 5.7(H) 5.9(H) 6.1(H) 6.0(H) 6.0(H)  Chol <200 mg/dL 163 - 214(H) 187 210(H)  HDL >50 mg/dL 61 - 63 50 68  Calc LDL <100 mg/dL 85 - 134(H) 121(H) 126(H)  Triglycerides <150 mg/dL 83 - 86 78 78  Creatinine 0.50 - 1.05 mg/dL 0.99 - 0.82 0.80 0.81   BP/Weight 06/01/2016 03/17/2016 12/17/2015 11/30/2015 09/24/2015 [id number]  Systolic BP XX123456 Q000111Q 0000000 Q000111Q [id number] Q000111Q 123456  Diastolic BP 84 77 80 59 76 72 72  Wt. (Lbs) 251 - 245.12 240 250 250 250  BMI 40.51 - 39.58 38.76 39.75 39.75 39.75   No flowsheet data found.

## 2016-06-13 NOTE — Assessment & Plan Note (Signed)
Needs to start regular supplement

## 2016-06-13 NOTE — Assessment & Plan Note (Signed)
The increased risk of cardiovascular disease associated with this diagnosis, and the need to consistently work on lifestyle to change this is discussed. Following  a  heart healthy diet ,commitment to 30 minutes of exercise at least 5 days per week, as well as control of blood sugar and cholesterol , and achieving a healthy weight are all the areas to be addressed .  

## 2016-06-13 NOTE — Assessment & Plan Note (Signed)
Deteriorated. Patient re-educated about  the importance of commitment to a  minimum of 150 minutes of exercise per week.  The importance of healthy food choices with portion control discussed. Encouraged to start a food diary, count calories and to consider  joining a support group. Sample diet sheets offered. Goals set by the patient for the next several months.   Weight /BMI 06/01/2016 12/17/2015 11/30/2015  WEIGHT 251 lb 245 lb 1.9 oz 240 lb  HEIGHT 5\' 6"  5\' 6"  5\' 6"   BMI 40.51 kg/m2 39.58 kg/m2 38.76 kg/m2

## 2016-06-13 NOTE — Progress Notes (Signed)
[facility]     MRN: [id number]      DOB: [birth year]   HPI Ms. Trivedi is here for physical exam , however wants to reschedule, states "does not feel up to it" C/o excess cough and chest congestion, clear drainage, no fever or chills .  C/o excess fatigue , no energy, wants to resume phentermine though no convincing benefit. Does snore and needs sleep apnea eval and treatment, she agrees to go.  C/o uncontrolled anxiety, not depressed, not suicidal or homicidal Preventive health is updated, specifically  Cancer screening and Immunization.   Questions or concerns regarding consultations or procedures which the PT has had in the interim are  Addressed.Has been having treatment  current medications since the last visit.  ROS Denies recent fever or chills. Denies chest pains, palpitations and leg swelling Denies abdominal pain, nausea, vomiting,diarrhea or constipation.   Denies dysuria, frequency, hesitancy or incontinence. Denies joint pain, swelling and limitation in mobility. Denies headaches, seizures, numbness, or tingling.  Denies skin break down or rash.   PE  BP 136/84   Pulse 71   Resp 16   Ht 5\' 6"  (1.676 m)   Wt 251 lb (113.9 kg)   LMP 05/28/2016   SpO2 98%   BMI 40.51 kg/m   Patient alert and oriented and in no cardiopulmonary distress.  HEENT: No facial asymmetry, EOMI,   oropharynx pink and moist.  Neck supple no JVD, no mass.Erythema and edema of nasal mucosa, excess clear nasal drainage and sneezing  Chest: Clear to auscultation bilaterally.  CVS: S1, S2 no murmurs, no S3.Regular rate.  ABD: Soft non tender.   Ext: No edema  MS: Adequate ROM spine, shoulders, hips and knees.  Skin: Intact, no ulcerations or rash noted.  Psych: Good eye contact, normal affect. Memory intact not anxious or depressed appearing.  CNS: CN 2-12 intact, power,  normal throughout.no focal deficits noted.   Assessment & Plan  GAD (generalized anxiety disorder) Score of  21 in 05/2016, untreated and uncontrolled , start buspar and behavior modification discussed  Snoring Fatigue, excess snoring and daytime somnolence, refer for sleep eval  Vitamin D deficiency Needs to start regular supplement  Essential hypertension Controlled, no change in medication DASH diet and commitment to daily physical activity for a minimum of 30 minutes discussed and encouraged, as a part of hypertension management. The importance of attaining a healthy weight is also discussed.  BP/Weight 06/01/2016 03/17/2016 12/17/2015 11/30/2015 09/24/2015 [id number]  Systolic BP XX123456 Q000111Q 0000000 Q000111Q [id number] Q000111Q 123456  Diastolic BP 84 77 80 59 76 72 72  Wt. (Lbs) 251 - 245.12 240 250 250 250  BMI 40.51 - 39.58 38.76 39.75 39.75 39.75       Prediabetes Patient educated about the importance of limiting  Carbohydrate intake , the need to commit to daily physical activity for a minimum of 30 minutes , and to commit weight loss. The fact that changes in all these areas will reduce or eliminate all together the development of diabetes is stressed.  Improved  Diabetic Labs Latest Ref Rng & Units 06/01/2016 12/16/2015 07/30/2015 12/27/2014 07/10/2014  HbA1c <5.7 % 5.7(H) 5.9(H) 6.1(H) 6.0(H) 6.0(H)  Chol <200 mg/dL 163 - 214(H) 187 210(H)  HDL >50 mg/dL 61 - 63 50 68  Calc LDL <100 mg/dL 85 - 134(H) 121(H) 126(H)  Triglycerides <150 mg/dL 83 - 86 78 78  Creatinine 0.50 - 1.05 mg/dL 0.99 - 0.82 0.80 0.81   BP/Weight 06/01/2016  03/17/2016 12/17/2015 11/30/2015 09/24/2015 [id number]  Systolic BP XX123456 Q000111Q 0000000 Q000111Q [id number] Q000111Q 123456  Diastolic BP 84 77 80 59 76 72 72  Wt. (Lbs) 251 - 245.12 240 250 250 250  BMI 40.51 - 39.58 38.76 39.75 39.75 39.75   No flowsheet data found.    Metabolic syndrome X The increased risk of cardiovascular disease associated with this diagnosis, and the need to consistently work on lifestyle to change this is discussed. Following  a  heart healthy diet ,commitment to 30  minutes of exercise at least 5 days per week, as well as control of blood sugar and cholesterol , and achieving a healthy weight are all the areas to be addressed .   MORBID OBESITY Deteriorated. Patient re-educated about  the importance of commitment to a  minimum of 150 minutes of exercise per week.  The importance of healthy food choices with portion control discussed. Encouraged to start a food diary, count calories and to consider  joining a support group. Sample diet sheets offered. Goals set by the patient for the next several months.   Weight /BMI 06/01/2016 12/17/2015 11/30/2015  WEIGHT 251 lb 245 lb 1.9 oz 240 lb  HEIGHT 5\' 6"  5\' 6"  5\' 6"   BMI 40.51 kg/m2 39.58 kg/m2 38.76 kg/m2

## 2016-06-13 NOTE — Assessment & Plan Note (Signed)
Controlled, no change in medication DASH diet and commitment to daily physical activity for a minimum of 30 minutes discussed and encouraged, as a part of hypertension management. The importance of attaining a healthy weight is also discussed.  BP/Weight 06/01/2016 03/17/2016 12/17/2015 11/30/2015 09/24/2015 [id number]  Systolic BP XX123456 Q000111Q 0000000 Q000111Q [id number] Q000111Q 123456  Diastolic BP 84 77 80 59 76 72 72  Wt. (Lbs) 251 - 245.12 240 250 250 250  BMI 40.51 - 39.58 38.76 39.75 39.75 39.75

## 2016-06-13 NOTE — Assessment & Plan Note (Signed)
Fatigue, excess snoring and daytime somnolence, refer for sleep eval

## 2016-06-14 ENCOUNTER — Ambulatory Visit (INDEPENDENT_AMBULATORY_CARE_PROVIDER_SITE_OTHER): Payer: BC Managed Care – PPO | Admitting: Podiatry

## 2016-06-14 ENCOUNTER — Telehealth: Payer: Self-pay | Admitting: *Deleted

## 2016-06-14 DIAGNOSIS — M659 Synovitis and tenosynovitis, unspecified: Secondary | ICD-10-CM | POA: Diagnosis not present

## 2016-06-14 DIAGNOSIS — M76822 Posterior tibial tendinitis, left leg: Secondary | ICD-10-CM

## 2016-06-14 DIAGNOSIS — M79671 Pain in right foot: Secondary | ICD-10-CM | POA: Diagnosis not present

## 2016-06-14 DIAGNOSIS — M25572 Pain in left ankle and joints of left foot: Secondary | ICD-10-CM

## 2016-06-14 DIAGNOSIS — M7752 Other enthesopathy of left foot: Secondary | ICD-10-CM

## 2016-06-14 DIAGNOSIS — M722 Plantar fascial fibromatosis: Secondary | ICD-10-CM | POA: Diagnosis not present

## 2016-06-14 DIAGNOSIS — M779 Enthesopathy, unspecified: Secondary | ICD-10-CM

## 2016-06-14 DIAGNOSIS — M79672 Pain in left foot: Secondary | ICD-10-CM

## 2016-06-14 NOTE — Telephone Encounter (Addendum)
-----   Message from Edrick Kins, DPM sent at 06/14/2016 10:21 AM EST ----- Regarding: MRI left ankle Please schedule MRI left ankle w/out contrast.  Dx : left ankle pain. Posterior tibial tendinitis left.   Thanks, Dr. Amalia Hailey. Faxed to [facility]. 06/23/2016-[employer] - [facility] APPROVED MRI LEFT ANKLE [id number], ORDER # [id number], VALID 06/23/2016 THROUGH 07/22/2016. Authorization called to [place].

## 2016-06-20 MED ORDER — BETAMETHASONE SOD PHOS & ACET 6 (3-3) MG/ML IJ SUSP: 3.0000 mg | Freq: Once | INTRAMUSCULAR | Status: DC

## 2016-06-20 NOTE — Progress Notes (Signed)
Subjective:  Patient presents today for follow-up evaluation of bilateral ankle and plantar fascial pain. Patient states that the plantar fascial bands helped for the first few weeks, however the begin irritating her feet. Patient states that the injections helped tremendously into her bilateral ankle joints for the first injections, however over the past 4 weeks she has not experienced a great amount of relief..  Patient states that over the past 4 weeks she has not received much relief regarding her foot and ankle pain. Patient states the left lower extremity is more symptomatic than the right lower extremity.  Patient presents today for pain and tenderness to the bilateral ankles which has been ongoing now for the past few days. Patient denies trauma.   Patient presents for further treatment and evaluation.    Objective / Physical Exam:  General:  The patient is alert and oriented x3 in no acute distress.  Dermatology:  Skin is warm, dry and supple bilateral lower extremities. Negative for open lesions or macerations.  Vascular:  Palpable pedal pulses bilaterally. No edema or erythema noted. Capillary refill within normal limits.  Neurological:  Epicritic and protective threshold grossly intact bilaterally.   Musculoskeletal Exam:  Pain on palpation to the anterior lateral medial aspects of the patient's bilateral ankle joints. Mild edema noted. Pain on palpation also noted to the posterior tibial tendon left lower extremity. Pain with forced inversion. Pain on palpation also noted to the plantar fascial insertion of the right lower extremity at the level of the medial calcaneal tubercle. Range of motion within normal limits to all pedal and ankle joints bilateral. Muscle strength 5/5 in all groups bilateral.   Assessment: #1 pain in bilateral ankles and feet #2 synovitis with capsulitis of bilateral ankles #3 posterior tibial tendinitis left lower extremity #4 plantar fasciitis  right foot   Plan of Care:  #1 Patient was evaluated. #2 today injection of 0.5 mL Celestone Soluspan injected in the patient's left ankle joint #3 injection of 0.5 mL Celestone Soluspan injected in the patient's posterior tibial tendon sheath to the left lower extremity. #4 injection of 0.5 mL Celestone Soluspan injected into the plantar fascial insertion at the level of the medial calcaneal tubercle right lower extremity #5 continue wearing custom molded orthotics, ankle braces bilateral, anti-inflammatory pain cream, compression ankle sleeve was #5 patient may require arthroscopic ankle synovectomy left lower extremity. #6 MRI order placed today for left ankle for concerning posterior tibial tendinitis possible posterior tibial tendon tear and ankle joint synovitis #7. Return to clinic in 4 weeks  Dr. Edrick Kins, [place]

## 2016-06-21 NOTE — Telephone Encounter (Deleted)
-----   Message from Edrick Kins, DPM sent at 06/20/2016 11:12 AM EST ----- Regarding: MRI left ankle Sorry not sure if I sent you a message regarding this patient...  Please order MRI w/out contrast left ankle.   Dx: possible posterior tendon tear. Ankle joint synovitis. Pain and edema left ankle.   Thanks, Dr. Amalia Hailey

## 2016-06-22 ENCOUNTER — Other Ambulatory Visit: Payer: Self-pay | Admitting: Podiatry

## 2016-06-26 ENCOUNTER — Inpatient Hospital Stay: Admission: RE | Admit: 2016-06-26 | Payer: BC Managed Care – PPO | Source: Ambulatory Visit

## 2016-07-12 ENCOUNTER — Ambulatory Visit: Payer: BC Managed Care – PPO | Admitting: Podiatry

## 2016-07-14 ENCOUNTER — Ambulatory Visit (INDEPENDENT_AMBULATORY_CARE_PROVIDER_SITE_OTHER): Payer: BC Managed Care – PPO

## 2016-07-14 ENCOUNTER — Other Ambulatory Visit (HOSPITAL_COMMUNITY)
Admission: RE | Admit: 2016-07-14 | Discharge: 2016-07-14 | Disposition: A | Discharge status: Home / Self Care | Payer: BC Managed Care – PPO | Source: Other Acute Inpatient Hospital | Attending: Family Medicine | Admitting: Family Medicine

## 2016-07-14 DIAGNOSIS — N3001 Acute cystitis with hematuria: Secondary | ICD-10-CM | POA: Insufficient documentation

## 2016-07-14 LAB — POCT URINALYSIS DIPSTICK
Bilirubin, UA: NEGATIVE
Glucose, UA: NEGATIVE
KETONES UA: NEGATIVE
Nitrite, UA: NEGATIVE
PH UA: 6.5
UROBILINOGEN UA: 0.2

## 2016-07-14 MED ORDER — CIPROFLOXACIN HCL 500 MG PO TABS: 500.0000 mg | ORAL_TABLET | Freq: Two times a day (BID) | ORAL | 0 refills | Status: DC

## 2016-07-14 NOTE — Progress Notes (Signed)
Patient in for nurse visit for UTI symptoms.  C/o pressure with urination and frequency.  Symptoms x 4-5 days.     Standing order initiated for Cipro 500mg  1 tablet BID x 3 days   Urine sent for culture.

## 2016-07-16 ENCOUNTER — Other Ambulatory Visit: Payer: Self-pay | Admitting: Family Medicine

## 2016-07-16 ENCOUNTER — Telehealth: Payer: Self-pay

## 2016-07-16 NOTE — Telephone Encounter (Signed)
oK , but in future if they will be out of work need to be seen by mD

## 2016-07-16 NOTE — Telephone Encounter (Signed)
Patient aware and work excuse composed.

## 2016-07-17 LAB — URINE CULTURE: Culture: >=100000 — AB

## 2016-08-17 ENCOUNTER — Other Ambulatory Visit: Payer: Self-pay | Admitting: Family Medicine

## 2016-08-17 ENCOUNTER — Encounter: Payer: BC Managed Care – PPO | Admitting: Family Medicine

## 2016-09-07 ENCOUNTER — Other Ambulatory Visit: Payer: Self-pay | Admitting: Family Medicine

## 2016-09-07 ENCOUNTER — Other Ambulatory Visit: Payer: Self-pay | Admitting: Podiatry

## 2016-09-14 ENCOUNTER — Other Ambulatory Visit: Payer: Self-pay | Admitting: Podiatry

## 2016-09-20 ENCOUNTER — Telehealth: Payer: Self-pay | Admitting: Podiatry

## 2016-09-20 NOTE — Telephone Encounter (Signed)
Pt wants antiinflammatory medication and pain medication until seen 09/29/2016. l left message informing pt to take OTC Ibuprofen or Aleve as package directs if tolerates and ice 3-4 times daily for 15-20 minutes each session protecting the skin from the ice with cloth, until seen in office.

## 2016-09-20 NOTE — Telephone Encounter (Signed)
Pt called and is scheduled to see Dr Rebekah Chesterfield 3.28.18 and was asking for a rx for an anti-inflammatory and pain medication until she has her appointment

## 2016-09-29 ENCOUNTER — Encounter: Payer: Self-pay | Admitting: Podiatry

## 2016-09-29 ENCOUNTER — Ambulatory Visit (INDEPENDENT_AMBULATORY_CARE_PROVIDER_SITE_OTHER): Payer: BC Managed Care – PPO | Admitting: Podiatry

## 2016-09-29 DIAGNOSIS — M25571 Pain in right ankle and joints of right foot: Secondary | ICD-10-CM | POA: Diagnosis not present

## 2016-09-29 DIAGNOSIS — M25572 Pain in left ankle and joints of left foot: Secondary | ICD-10-CM

## 2016-09-29 DIAGNOSIS — M7751 Other enthesopathy of right foot: Secondary | ICD-10-CM

## 2016-09-29 DIAGNOSIS — M659 Synovitis and tenosynovitis, unspecified: Secondary | ICD-10-CM | POA: Diagnosis not present

## 2016-09-29 DIAGNOSIS — M7752 Other enthesopathy of left foot: Secondary | ICD-10-CM

## 2016-09-29 DIAGNOSIS — M76822 Posterior tibial tendinitis, left leg: Secondary | ICD-10-CM

## 2016-09-30 ENCOUNTER — Telehealth: Payer: Self-pay | Admitting: *Deleted

## 2016-09-30 MED ORDER — MELOXICAM 15 MG PO TABS: 15.0000 mg | ORAL_TABLET | Freq: Every day | ORAL | 2 refills | Status: DC

## 2016-09-30 NOTE — Telephone Encounter (Signed)
Pt states she is going out of town and would like refill of Meloxicam, because she is going to do a lot of walking. Dr. Amalia Hailey ordered Meloxicam yesterday. I left message informing pt the Meloxicam had been called to the [facility].

## 2016-10-02 MED ORDER — BETAMETHASONE SOD PHOS & ACET 6 (3-3) MG/ML IJ SUSP: 3.0000 mg | Freq: Once | INTRAMUSCULAR | Status: DC

## 2016-10-02 NOTE — Progress Notes (Signed)
Subjective:  Patient presents today for follow-up evaluation of bilateral ankle and plantar fascial pain. atient continues to have chronic ankle pain bilateral.eft worse than the right. Last visit and MRI was ordered however she states she did not do the    Objective / Physical Exam:  General:  The patient is alert and oriented x3 in no acute distress.  Dermatology:  Skin is warm, dry and supple bilateral lower extremities. Negative for open lesions or macerations.  Vascular:  Palpable pedal pulses bilaterally. No edema or erythema noted. Capillary refill within normal limits.  Neurological:  Epicritic and protective threshold grossly intact bilaterally.   Musculoskeletal Exam:  Pain on palpation to the anterior lateral medial aspects of the patient's bilateral ankle joints. Mild edema noted. Pain on palpation also noted to the posterior tibial tendon left lower extremity. Pain with forced inversion. Pain on palpation also noted to the plantar fascial insertion of the right lower extremity at the level of the medial calcaneal tubercle. Range of motion within normal limits to all pedal and ankle joints bilateral. Muscle strength 5/5 in all groups bilateral.   Assessment: #1 pain in bilateral ankles and feet #2 synovitis with capsulitis of bilateral ankles #3 posterior tibial tendinitis left lower extremity #4 plantar fasciitis right foot-resolved   Plan of Care:  #1 Patient was evaluated. #2 today injection of 0.5 mL Celestone Soluspan injected in the patient's bilateral ankle joints #3 injection of 0.5 mL Celestone Soluspan injected in the patient's posterior tibial tendon sheath to the left lower extremity. #4 continue wearing custom molded orthotics, ankle braces bilateral, anti-inflammatory pain cream, compression ankle sleeve was #5 patient may require arthroscopic ankle synovectomy left lower extremity. #6 return to clinic in 4 weeks  Edrick Kins, DPM [facility]  Dr. Edrick Kins, [facility] [place]                                        [place], [place] [address]                Office [phone]  Fax [phone]

## 2016-10-27 ENCOUNTER — Ambulatory Visit (INDEPENDENT_AMBULATORY_CARE_PROVIDER_SITE_OTHER): Payer: BC Managed Care – PPO | Admitting: Podiatry

## 2016-10-27 DIAGNOSIS — M76822 Posterior tibial tendinitis, left leg: Secondary | ICD-10-CM

## 2016-10-27 DIAGNOSIS — M79676 Pain in unspecified toe(s): Secondary | ICD-10-CM

## 2016-10-27 DIAGNOSIS — L600 Ingrowing nail: Secondary | ICD-10-CM | POA: Diagnosis not present

## 2016-10-27 DIAGNOSIS — L03039 Cellulitis of unspecified toe: Secondary | ICD-10-CM | POA: Diagnosis not present

## 2016-10-27 NOTE — Patient Instructions (Signed)

## 2016-10-29 NOTE — Progress Notes (Signed)
Subjective:  Patient presents today for follow-up evaluation of bilateral ankle and plantar fascial pain. She states the pain is better than it was at her last visit and reports that the injection helped. She has a new complaint of intermittent pain to the lateral side of the fourth left toe that has been present for the past month. Wearing shoes increases the pain.   Objective / Physical Exam:  General:  The patient is alert and oriented x3 in no acute distress.  Dermatology:  Skin is warm, dry and supple bilateral. Lateral border of fourth left toe appears to be erythematous with evidence of an ingrowing nail. Pain on palpation noted to the border of the nail fold. Skin is warm, dry and supple bilateral lower extremities. Negative for open lesions or macerations.  Vascular:  Palpable pedal pulses bilaterally. No edema or erythema noted. Capillary refill within normal limits.  Neurological:  Epicritic and protective threshold grossly intact bilaterally.   Musculoskeletal Exam:  Pain on palpation noted to the posterior tibial tendon extending into the medial longitudinal arch of the left foot. Range of motion within normal limits to all pedal and ankle joints bilateral. Muscle strength 5/5 in all groups bilateral.   Assessment: #1 PT tendinitis left #2 ingrown fourth digit left lateral border  Plan of Care:  1. Patient was evaluated. 2. Discussed treatment alternatives and plan of care. Explained nail avulsion procedure and post procedure course to patient. 3. Patient opted for permanent partial nail avulsion.  4. Prior to procedure, local anesthesia infiltration utilized using 3 ml of a 50:50 mixture of 2% plain lidocaine and 0.5% plain marcaine in a normal hallux block fashion and a betadine prep performed.  5. Partial permanent nail avulsion with chemical matrixectomy performed using [id number] applications of phenol followed by alcohol flush.  6. Light dressing applied. 7. Injection  of 0.5 mL Celestone Soluspan injected into the left PT tendon. Care was taken to avoid direct injection into the tendon. 8. Return to clinic in 2 weeks.    Edrick Kins, DPM [facility]  Dr. Edrick Kins, [address]                                        [place], [place] [address]                Office [phone]  Fax [phone]

## 2016-10-31 MED ORDER — BETAMETHASONE SOD PHOS & ACET 6 (3-3) MG/ML IJ SUSP: 3.0000 mg | Freq: Once | INTRAMUSCULAR | Status: DC

## 2016-11-02 ENCOUNTER — Other Ambulatory Visit: Payer: Self-pay

## 2016-11-02 MED ORDER — TEMAZEPAM 15 MG PO CAPS: ORAL_CAPSULE | ORAL | 2 refills | Status: DC

## 2016-11-03 ENCOUNTER — Telehealth: Payer: Self-pay

## 2016-11-03 DIAGNOSIS — E8881 Metabolic syndrome: Secondary | ICD-10-CM

## 2016-11-03 DIAGNOSIS — E785 Hyperlipidemia, unspecified: Secondary | ICD-10-CM

## 2016-11-03 DIAGNOSIS — E559 Vitamin D deficiency, unspecified: Secondary | ICD-10-CM

## 2016-11-03 DIAGNOSIS — R7303 Prediabetes: Secondary | ICD-10-CM

## 2016-11-03 DIAGNOSIS — I10 Essential (primary) hypertension: Secondary | ICD-10-CM

## 2016-11-03 NOTE — Telephone Encounter (Signed)
Labs done and mailed

## 2016-11-15 ENCOUNTER — Ambulatory Visit: Payer: BC Managed Care – PPO | Admitting: Podiatry

## 2016-11-18 ENCOUNTER — Other Ambulatory Visit: Payer: Self-pay | Admitting: Family Medicine

## 2016-12-02 ENCOUNTER — Other Ambulatory Visit: Payer: Self-pay

## 2016-12-02 ENCOUNTER — Telehealth: Payer: Self-pay | Admitting: Family Medicine

## 2016-12-02 ENCOUNTER — Encounter: Payer: Self-pay | Admitting: Family Medicine

## 2016-12-02 ENCOUNTER — Encounter (INDEPENDENT_AMBULATORY_CARE_PROVIDER_SITE_OTHER): Payer: Self-pay | Admitting: *Deleted

## 2016-12-02 ENCOUNTER — Ambulatory Visit (INDEPENDENT_AMBULATORY_CARE_PROVIDER_SITE_OTHER): Payer: BC Managed Care – PPO | Admitting: Family Medicine

## 2016-12-02 ENCOUNTER — Other Ambulatory Visit (HOSPITAL_COMMUNITY)
Admission: RE | Admit: 2016-12-02 | Discharge: 2016-12-02 | Disposition: A | Discharge status: Home / Self Care | Payer: BC Managed Care – PPO | Source: Ambulatory Visit | Attending: Family Medicine | Admitting: Family Medicine

## 2016-12-02 VITALS — Resp 16 | Ht 66.0 in | Wt 264.0 lb

## 2016-12-02 DIAGNOSIS — Z124 Encounter for screening for malignant neoplasm of cervix: Secondary | ICD-10-CM

## 2016-12-02 DIAGNOSIS — Z1211 Encounter for screening for malignant neoplasm of colon: Secondary | ICD-10-CM | POA: Diagnosis not present

## 2016-12-02 DIAGNOSIS — Z0000 Encounter for general adult medical examination without abnormal findings: Secondary | ICD-10-CM

## 2016-12-02 DIAGNOSIS — E785 Hyperlipidemia, unspecified: Secondary | ICD-10-CM

## 2016-12-02 DIAGNOSIS — R7303 Prediabetes: Secondary | ICD-10-CM

## 2016-12-02 DIAGNOSIS — I10 Essential (primary) hypertension: Secondary | ICD-10-CM

## 2016-12-02 DIAGNOSIS — E559 Vitamin D deficiency, unspecified: Secondary | ICD-10-CM

## 2016-12-02 DIAGNOSIS — R0683 Snoring: Secondary | ICD-10-CM | POA: Diagnosis not present

## 2016-12-02 DIAGNOSIS — E8881 Metabolic syndrome: Secondary | ICD-10-CM

## 2016-12-02 LAB — BASIC METABOLIC PANEL WITH GFR
BUN: 11 mg/dL (ref 7–25)
CALCIUM: 8.9 mg/dL (ref 8.6–10.4)
CO2: 25 mmol/L (ref 20–31)
Chloride: 103 mmol/L (ref 98–110)
Creat: 0.92 mg/dL (ref 0.50–1.05)
GFR, EST AFRICAN AMERICAN: 83 mL/min (ref >=60)
GFR, EST NON AFRICAN AMERICAN: 72 mL/min (ref >=60)
GLUCOSE: 87 mg/dL (ref 65–99)
Potassium: 4.2 mmol/L (ref 3.5–5.3)
Sodium: 138 mmol/L (ref 135–146)

## 2016-12-02 LAB — TSH: TSH: 1.14 m[IU]/L

## 2016-12-02 LAB — CBC
HEMATOCRIT: 38.4 % (ref 35.0–45.0)
HEMOGLOBIN: 12.5 g/dL (ref 11.7–15.5)
MCH: 28.9 pg (ref 27.0–33.0)
MCHC: 32.6 g/dL (ref 32.0–36.0)
MCV: 88.9 fL (ref 80.0–100.0)
MPV: 9.4 fL (ref 7.5–12.5)
Platelets: 344 10*3/uL (ref 140–400)
RBC: 4.32 MIL/uL (ref 3.80–5.10)
RDW: 15.1 % — AB (ref 11.0–15.0)
WBC: 8.1 10*3/uL (ref 3.8–10.8)

## 2016-12-02 LAB — VITAMIN D 25 HYDROXY (VIT D DEFICIENCY, FRACTURES): Vit D, 25-Hydroxy: 39 ng/mL (ref 30–100)

## 2016-12-02 LAB — POC HEMOCCULT BLD/STL (OFFICE/1-CARD/DIAGNOSTIC): Fecal Occult Blood, POC: NEGATIVE

## 2016-12-02 MED ORDER — PHENTERMINE HCL 37.5 MG PO TBDP: ORAL_TABLET | ORAL | 1 refills | Status: DC

## 2016-12-02 MED ORDER — LOVASTATIN 20 MG PO TABS: 40.0000 mg | ORAL_TABLET | Freq: Every day | ORAL | 1 refills | Status: DC

## 2016-12-02 MED ORDER — METOPROLOL TARTRATE 25 MG PO TABS: ORAL_TABLET | ORAL | 1 refills | Status: DC

## 2016-12-02 NOTE — Telephone Encounter (Signed)
Patient was given a Rx today but it was not signed by Dr. Moshe Cipro. Please fax to [facility].   Pt's cb  [phone]

## 2016-12-02 NOTE — Assessment & Plan Note (Signed)
Deteriorated. Patient re-educated about  the importance of commitment to a  minimum of 150 minutes of exercise per week.  The importance of healthy food choices with portion control discussed. Encouraged to start a food diary, count calories and to consider  joining a support group. Sample diet sheets offered. Goals set by the patient for the next several months.   Weight /BMI 12/02/2016 06/01/2016 12/17/2015  WEIGHT 264 lb 251 lb 245 lb 1.9 oz  HEIGHT 5\' 6"  5\' 6"  5\' 6"   BMI 42.61 kg/m2 40.51 kg/m2 39.58 kg/m2   Start half phentermine daily, 10 pound weight loss goal in 4 months

## 2016-12-02 NOTE — Progress Notes (Signed)
Julie Ryan     MRN: [id number]      DOB: January 09, 1966  HPI: Patient is in for annual physical exam. No other health concerns are expressed or addressed at the visit. Recent labs, if available are reviewed. Immunization is reviewed , and  updated if needed.   PE: Pleasant  female, alert and oriented x 3, in no cardio-pulmonary distress. Afebrile. HEENT No facial trauma or asymetry. Sinuses non tender.  Extra occullar muscles intact, pupils equally reactive to light. External ears normal, tympanic membranes clear. Oropharynx moist, no exudate. Neck: supple, no adenopathy,JVD or thyromegaly.No bruits.  Chest: Clear to ascultation bilaterally.No crackles or wheezes. Non tender to palpation  Breast: No asymetry,no masses or lumps. No tenderness. No nipple discharge or inversion. No axillary or supraclavicular adenopathy  Cardiovascular system; Heart sounds normal,  S1 and  S2 ,no S3.  No murmur, or thrill. Apical beat not displaced Peripheral pulses normal.  Abdomen: Soft, non tender, no organomegaly or masses. No bruits. Bowel sounds normal. No guarding, tenderness or rebound.  Rectal:  Normal sphincter tone. No rectal mass. Guaiac negative stool.  GU: External genitalia normal female genitalia , normal female distribution of hair. No lesions. Urethral meatus normal in size, no  Prolapse, no lesions visibly  Present. Bladder non tender. Vagina pink and moist , with no visible lesions , discharge present . Adequate pelvic support no  cystocele or rectocele noted Cervix pink and appears healthy, no lesions or ulcerations noted, no discharge noted from os Uterus normal size, no adnexal masses, no cervical motion or adnexal tenderness.   Musculoskeletal exam: Full ROM of spine, hips , shoulders and knees. No deformity ,swelling or crepitus noted. No muscle wasting or atrophy.   Neurologic: Cranial nerves 2 to 12 intact. Power, tone ,sensation and reflexes  normal throughout. No disturbance in gait. No tremor.  Skin: Intact, no ulceration, erythema , scaling or rash noted. Pigmentation normal throughout  Psych; Normal mood and affect. Judgement and concentration normal   Assessment & Plan:  Annual physical exam Annual exam as documented. Counseling done  re healthy lifestyle involving commitment to 150 minutes exercise per week, heart healthy diet, and attaining healthy weight.The importance of adequate sleep also discussed. Regular seat belt use and home safety, is also discussed. Changes in health habits are decided on by the patient with goals and time frames  set for achieving them. Immunization and cancer screening needs are specifically addressed at this visit.   FATIGUE Snoring and chronic fatigue refer to neurology again for sleep study and treatment of sleep apnea  MORBID OBESITY Deteriorated. Patient re-educated about  the importance of commitment to a  minimum of 150 minutes of exercise per week.  The importance of healthy food choices with portion control discussed. Encouraged to start a food diary, count calories and to consider  joining a support group. Sample diet sheets offered. Goals set by the patient for the next several months.   Weight /BMI 12/02/2016 06/01/2016 12/17/2015  WEIGHT 264 lb 251 lb 245 lb 1.9 oz  HEIGHT 5\' 6"  5\' 6"  5\' 6"   BMI 42.61 kg/m2 40.51 kg/m2 39.58 kg/m2   Start half phentermine daily, 10 pound weight loss goal in 4 months   Prediabetes Patient educated about the importance of limiting  Carbohydrate intake , the need to commit to daily physical activity for a minimum of 30 minutes , and to commit weight loss. The fact that changes in all these areas will reduce or  eliminate all together the development of diabetes is stressed.   Diabetic Labs Latest Ref Rng & Units 06/01/2016 12/16/2015 07/30/2015 12/27/2014 07/10/2014  HbA1c <5.7 % 5.7(H) 5.9(H) 6.1(H) 6.0(H) 6.0(H)  Chol <200 mg/dL 163 -  214(H) 187 210(H)  HDL >50 mg/dL 61 - 63 50 68  Calc LDL <100 mg/dL 85 - 134(H) 121(H) 126(H)  Triglycerides <150 mg/dL 83 - 86 78 78  Creatinine 0.50 - 1.05 mg/dL 0.99 - 0.82 0.80 0.81   BP/Weight 12/02/2016 06/01/2016 03/17/2016 12/17/2015 11/30/2015 09/24/2015 1/66/0600  Systolic BP - 459 977 414 239 532 023  Diastolic BP - 84 77 80 59 76 72  Wt. (Lbs) 264 251 - 245.12 240 250 250  BMI 42.61 40.51 - 39.58 38.76 39.75 39.75   No flowsheet data found.  Updated lab today   Vitamin D deficiency Updated lab today, will determine ongoing treatment based on value

## 2016-12-02 NOTE — Assessment & Plan Note (Addendum)
Snoring and chronic fatigue refer to neurology again for sleep study and treatment of sleep apnea

## 2016-12-02 NOTE — Assessment & Plan Note (Signed)
Updated lab today, will determine ongoing treatment based on value

## 2016-12-02 NOTE — Assessment & Plan Note (Signed)
Patient educated about the importance of limiting  Carbohydrate intake , the need to commit to daily physical activity for a minimum of 30 minutes , and to commit weight loss. The fact that changes in all these areas will reduce or eliminate all together the development of diabetes is stressed.   Diabetic Labs Latest Ref Rng & Units 06/01/2016 12/16/2015 07/30/2015 12/27/2014 07/10/2014  HbA1c <5.7 % 5.7(H) 5.9(H) 6.1(H) 6.0(H) 6.0(H)  Chol <200 mg/dL 163 - 214(H) 187 210(H)  HDL >50 mg/dL 61 - 63 50 68  Calc LDL <100 mg/dL 85 - 134(H) 121(H) 126(H)  Triglycerides <150 mg/dL 83 - 86 78 78  Creatinine 0.50 - 1.05 mg/dL 0.99 - 0.82 0.80 0.81   BP/Weight 12/02/2016 06/01/2016 03/17/2016 12/17/2015 11/30/2015 09/24/2015 12/30/3149  Systolic BP - 761 607 371 062 694 854  Diastolic BP - 84 77 80 59 76 72  Wt. (Lbs) 264 251 - 245.12 240 250 250  BMI 42.61 40.51 - 39.58 38.76 39.75 39.75   No flowsheet data found.  Updated lab today

## 2016-12-02 NOTE — Telephone Encounter (Signed)
Sent!

## 2016-12-02 NOTE — Patient Instructions (Addendum)
F/u in 4 months, call if you need me before  Mammogram is past due, please schedule  NEW is HALF phentermine every day t help wit 10 pound weight loss It is important that you exercise regularly at least 30 minutes 5 times a week. If you develop chest pain, have severe difficulty breathing, or feel very tired, stop exercising immediately and seek medical attention    You are referred for colonoscopy and sleep eval  Labs today are vit D, cBC, chem 7 and EGFr and HBA1C and TSH  Pap today  Will decide if you need to continue weekly vit D after I see the lab  Thank you  for choosing [facility]. We consider it a privelige to serve you.  Delivering excellent health care in a caring and  compassionate way is our goal.  Partnering with you,  so that together we can achieve this goal is our strategy.

## 2016-12-02 NOTE — Assessment & Plan Note (Signed)

## 2016-12-03 LAB — HEMOGLOBIN A1C
Hgb A1c MFr Bld: 5.9 % — ABNORMAL HIGH (ref <5.7)
MEAN PLASMA GLUCOSE: 123 mg/dL

## 2016-12-07 ENCOUNTER — Other Ambulatory Visit: Payer: Self-pay | Admitting: Family Medicine

## 2016-12-07 DIAGNOSIS — M79641 Pain in right hand: Secondary | ICD-10-CM

## 2016-12-07 LAB — CYTOLOGY - PAP
Diagnosis: NEGATIVE
HPV: NOT DETECTED

## 2016-12-08 ENCOUNTER — Other Ambulatory Visit: Payer: Self-pay | Admitting: Family Medicine

## 2016-12-08 DIAGNOSIS — Z1231 Encounter for screening mammogram for malignant neoplasm of breast: Secondary | ICD-10-CM

## 2016-12-09 LAB — HM DIABETES EYE EXAM

## 2016-12-09 NOTE — Progress Notes (Signed)
Called pt aware of lab results.

## 2016-12-10 ENCOUNTER — Other Ambulatory Visit: Payer: Self-pay

## 2016-12-10 MED ORDER — FLUCONAZOLE 150 MG PO TABS: 150.0000 mg | ORAL_TABLET | Freq: Once | ORAL | 0 refills | Status: AC

## 2016-12-10 MED ORDER — METRONIDAZOLE 500 MG PO TABS: 500.0000 mg | ORAL_TABLET | Freq: Two times a day (BID) | ORAL | 0 refills | Status: DC

## 2016-12-22 ENCOUNTER — Ambulatory Visit (HOSPITAL_COMMUNITY)
Admission: RE | Admit: 2016-12-22 | Discharge: 2016-12-22 | Disposition: A | Discharge status: Home / Self Care | Payer: BC Managed Care – PPO | Source: Ambulatory Visit | Attending: Family Medicine | Admitting: Family Medicine

## 2016-12-22 DIAGNOSIS — Z1231 Encounter for screening mammogram for malignant neoplasm of breast: Secondary | ICD-10-CM | POA: Diagnosis present

## 2016-12-31 ENCOUNTER — Other Ambulatory Visit (HOSPITAL_BASED_OUTPATIENT_CLINIC_OR_DEPARTMENT_OTHER): Payer: Self-pay

## 2016-12-31 DIAGNOSIS — G4733 Obstructive sleep apnea (adult) (pediatric): Secondary | ICD-10-CM

## 2017-01-25 ENCOUNTER — Other Ambulatory Visit: Payer: Self-pay | Admitting: Family Medicine

## 2017-01-26 ENCOUNTER — Other Ambulatory Visit (INDEPENDENT_AMBULATORY_CARE_PROVIDER_SITE_OTHER): Payer: Self-pay | Admitting: *Deleted

## 2017-01-26 DIAGNOSIS — Z1211 Encounter for screening for malignant neoplasm of colon: Secondary | ICD-10-CM | POA: Insufficient documentation

## 2017-02-14 ENCOUNTER — Ambulatory Visit: Payer: BC Managed Care – PPO | Attending: Neurology | Admitting: Neurology

## 2017-02-14 DIAGNOSIS — G4733 Obstructive sleep apnea (adult) (pediatric): Secondary | ICD-10-CM | POA: Diagnosis not present

## 2017-02-14 DIAGNOSIS — Z7984 Long term (current) use of oral hypoglycemic drugs: Secondary | ICD-10-CM | POA: Insufficient documentation

## 2017-02-14 DIAGNOSIS — Z79899 Other long term (current) drug therapy: Secondary | ICD-10-CM | POA: Insufficient documentation

## 2017-02-14 DIAGNOSIS — R0683 Snoring: Secondary | ICD-10-CM | POA: Insufficient documentation

## 2017-02-17 ENCOUNTER — Telehealth: Payer: Self-pay | Admitting: Family Medicine

## 2017-02-17 NOTE — Telephone Encounter (Signed)
We will not have the flu vaccine until AFTER 08/21. We can do the TB test on a mon, tues or wed only. And we can give the TD booster and varicells lab draw. She will have to get the MMR and Hep C vaccines at a pharmacy. Please advise and schedule for end of the month

## 2017-02-17 NOTE — Telephone Encounter (Signed)
appt made for Aug 29th for TB TEST/TD BOOSTER/VARICELLA LAB DRAW. Patient will check the pharmacy for MMR and Hep C vaccines.

## 2017-02-17 NOTE — Telephone Encounter (Signed)
I see where you already Earvin Hansen that she get these on   Mon, Tuesday or Wednesday, not sure why that was changed, and really better done on those days, if it can be changed without  Much problem go ahead, if an issue then leave as is, but what you originally directed is correct as far as I am concerned

## 2017-02-17 NOTE — Telephone Encounter (Signed)
Patient taking classes for CNA.  Asking if she needs to make an appt for the following:  TB skin test  Hep 3 (series of 3) MMR Current flu vaccine TD booster Varicella Titer  (series of 2)

## 2017-02-17 NOTE — Telephone Encounter (Signed)
Nurse visit scheduled for PPD, Tdap and varicella titer for school. Wanted to get the ok from you before she comes in

## 2017-02-18 NOTE — Telephone Encounter (Signed)
scheduled

## 2017-02-22 ENCOUNTER — Ambulatory Visit: Payer: BC Managed Care – PPO | Admitting: Family Medicine

## 2017-02-25 NOTE — Procedures (Signed)
Percival A. Merlene Laughter, MD     [email or web address]             NOCTURNAL POLYSOMNOGRAPHY   LOCATION: [facility]   Patient Name: Julie Ryan, Julie Ryan Date: 02/14/2017 Gender: Female D.O.B: [birth year] Age (years): 1 Referring Provider: Phillips Odor MD, ABSM Height (inches): 66 Interpreting Physician: Phillips Odor MD, ABSM Weight (lbs): 270 RPSGT: Rosebud Poles BMI: 44 MRN: [id number] Neck Size: 16.50 CLINICAL INFORMATION Sleep Study Type: NPSG  Indication for sleep study: N/A  Epworth Sleepiness Score: 8  SLEEP STUDY TECHNIQUE As per the AASM Manual for the Scoring of Sleep and Associated Events v2.3 (April 2016) with a hypopnea requiring 4% desaturations.  The channels recorded and monitored were frontal, central and occipital EEG, electrooculogram (EOG), submentalis EMG (chin), nasal and oral airflow, thoracic and abdominal wall motion, anterior tibialis EMG, snore microphone, electrocardiogram, and pulse oximetry.  MEDICATIONS Medications self-administered by patient taken the night of the study : N/A  Current Outpatient Prescriptions:  .  busPIRone (BUSPAR) 5 MG tablet, Take 1 tablet (5 mg total) by mouth 3 (three) times daily., Disp: 90 tablet, Rfl: 2 .  ergocalciferol (VITAMIN D2) 50000 units capsule, Take 1 capsule (50,000 Units total) by mouth once a week. One capsule once weekly, Disp: 12 capsule, Rfl: 1 .  fluticasone (FLONASE) 50 MCG/ACT nasal spray, Place 2 sprays into both nostrils daily., Disp: 16 g, Rfl: 6 .  gabapentin (NEURONTIN) 300 MG capsule, TAKE ONE CAPSULE BY MOUTH AT BEDTIME, Disp: 90 capsule, Rfl: 1 .  loratadine (CLARITIN) 10 MG tablet, Take 1 tablet (10 mg total) by mouth daily., Disp: 30 tablet, Rfl: 11 .  lovastatin (MEVACOR) 20 MG tablet, Take 2 tablets (40 mg total) by mouth at bedtime., Disp: 180 tablet, Rfl: 1 .  meloxicam (MOBIC) 15 MG tablet, Take 1 tablet (15 mg total) by mouth daily., Disp: 60 tablet, Rfl: 2 .   metFORMIN (GLUCOPHAGE-XR) 500 MG 24 hr tablet, TAKE ONE TABLET BY MOUTH ONCE DAILY WITH BREAKFAST, Disp: 90 tablet, Rfl: 1 .  metoprolol tartrate (LOPRESSOR) 25 MG tablet, TAKE ONE TABLET BY MOUTH TWICE DAILY FOR BLOOD PRESSURE, Disp: 180 tablet, Rfl: 1 .  metroNIDAZOLE (FLAGYL) 500 MG tablet, Take 1 tablet (500 mg total) by mouth 2 (two) times daily., Disp: 14 tablet, Rfl: 0 .  NONFORMULARY OR COMPOUNDED ITEM, Apply 1-2 g topically 4 (four) times daily., Disp: 120 each, Rfl: 2 .  Phentermine HCl 37.5 MG TBDP, One tablet every morning before breakfast, Disp: 30 tablet, Rfl: 1 .  ranitidine (ZANTAC) 150 MG tablet, TAKE ONE TABLET BY MOUTH TWICE DAILY, Disp: 180 tablet, Rfl: 1 .  temazepam (RESTORIL) 15 MG capsule, TAKE  CAPSULE BY MOUTH ONCE DAILY AT BEDTIME, Disp: 30 capsule, Rfl: 2  Current Facility-Administered Medications:  .  betamethasone acetate-betamethasone sodium phosphate (CELESTONE) injection 12 mg, 12 mg, Intramuscular, Once, Evans, Brent M, DPM .  betamethasone acetate-betamethasone sodium phosphate (CELESTONE) injection 3 mg, 3 mg, Intramuscular, Once, Evans, Brent M, DPM .  betamethasone acetate-betamethasone sodium phosphate (CELESTONE) injection 3 mg, 3 mg, Intramuscular, Once, Evans, Brent M, DPM .  betamethasone acetate-betamethasone sodium phosphate (CELESTONE) injection 3 mg, 3 mg, Intramuscular, Once, Evans, Brent M, DPM .  betamethasone acetate-betamethasone sodium phosphate (CELESTONE) injection 3 mg, 3 mg, Intramuscular, Once, Evans, Dorathy Daft, DPM   SLEEP ARCHITECTURE The study was initiated at 10:04:11 PM and ended at 5:00:04 AM.  Sleep onset time was 46.8 minutes and the sleep efficiency was 83.8%. The total sleep  time was 348.6 minutes.  Stage REM latency was 86.0 minutes.  The patient spent 2.58% of the night in stage N1 sleep, 63.42% in stage N2 sleep, 20.94% in stage N3 and 13.05% in REM.  Alpha intrusion was absent.  Supine sleep was 31.91%.  RESPIRATORY  PARAMETERS The overall apnea/hypopnea index (AHI) was 1.4 per hour. There were 5 total apneas, including 3 obstructive, 0 central and 2 mixed apneas. There were 3 hypopneas and 6 RERAs.  The AHI during Stage REM sleep was 10.5 per hour.  AHI while supine was 0.0 per hour.  The mean oxygen saturation was 97.70%. The minimum SpO2 during sleep was 92.00%.  Moderate snoring was noted during this study.  CARDIAC DATA The 2 lead EKG demonstrated sinus rhythm. The mean heart rate was N/A beats per minute. Other EKG findings include: None. LEG MOVEMENT DATA The total PLMS were 10 with a resulting PLMS index of 1.72. Associated arousal with leg movement index was 0.7.  IMPRESSIONS This recording is unremarkable.  Delano Metz, MD Diplomate, American Board of Sleep Medicine.   ELECTRONICALLY SIGNED ON:  02/25/2017, 8:15 PM [place] PH: [phone]   FX: [phone] [place]

## 2017-03-02 ENCOUNTER — Encounter: Payer: Self-pay | Admitting: Family Medicine

## 2017-03-02 ENCOUNTER — Ambulatory Visit (INDEPENDENT_AMBULATORY_CARE_PROVIDER_SITE_OTHER): Payer: BC Managed Care – PPO | Admitting: Family Medicine

## 2017-03-02 VITALS — BP 158/90 | HR 84 | Resp 16 | Ht 66.0 in | Wt 274.0 lb

## 2017-03-02 DIAGNOSIS — F411 Generalized anxiety disorder: Secondary | ICD-10-CM | POA: Diagnosis not present

## 2017-03-02 DIAGNOSIS — Z8619 Personal history of other infectious and parasitic diseases: Secondary | ICD-10-CM | POA: Diagnosis not present

## 2017-03-02 DIAGNOSIS — I10 Essential (primary) hypertension: Secondary | ICD-10-CM

## 2017-03-02 DIAGNOSIS — Z111 Encounter for screening for respiratory tuberculosis: Secondary | ICD-10-CM

## 2017-03-02 DIAGNOSIS — R7303 Prediabetes: Secondary | ICD-10-CM | POA: Diagnosis not present

## 2017-03-02 DIAGNOSIS — R600 Localized edema: Secondary | ICD-10-CM

## 2017-03-02 MED ORDER — POTASSIUM CHLORIDE CRYS ER 20 MEQ PO TBCR
EXTENDED_RELEASE_TABLET | ORAL | 0 refills | Status: DC
Start: 2017-03-02 — End: 2018-12-14

## 2017-03-02 MED ORDER — FUROSEMIDE 20 MG PO TABS: ORAL_TABLET | ORAL | 0 refills | Status: DC

## 2017-03-02 NOTE — Patient Instructions (Addendum)
F/u in 4 months  Stop phentermine  New is lasix for at most 10 days, take for 3 days with potassium then only if needed  Control food choice please  Thankful that stress is better   Weight reduction needs to begin again and we KNOW that it will  Please work on [place] smoking, this is very harmful to your health, you need to [place] entirely, smoking 1 to 3 since July    Wax is in the right ear partly covering eardrum use wax softener to help to remove for 3 days use your fingertip, n or ear flush, NOT Qtips  Form to be collected end of the week

## 2017-03-04 ENCOUNTER — Telehealth: Payer: Self-pay | Admitting: *Deleted

## 2017-03-04 DIAGNOSIS — Z789 Other specified health status: Secondary | ICD-10-CM

## 2017-03-04 LAB — TB SKIN TEST
INDURATION: 0 mm
TB Skin Test: NEGATIVE

## 2017-03-04 LAB — VARICELLA ZOSTER ANTIBODY, IGG: VARICELLA IGG: 1351 {index} — AB (ref <135.00)

## 2017-03-04 NOTE — Telephone Encounter (Signed)
Patient does want MMR titre. I did explain to her that because we were unsure as whether she wanted to have the titre or be vaccinated, the lab had to discard her blood draw due to regulations. I explained that I was faced with the choice of discarding her blood and have her be re-stuck if she wanted the lab, or running the lab and face having her pay for a test she might not have wanted. She verbally expressed understanding and will return today for blood draw. She is certain that she had the vaccines as a child.

## 2017-03-04 NOTE — Telephone Encounter (Signed)
Thank you for follow through and clarification  Please DO order the titer.

## 2017-03-04 NOTE — Telephone Encounter (Signed)
Patient states she is returning a call from yesterday. She states a nurse or a receptionist call her and she was unable to talk. Patient is requesting a call back. Her contact number is [phone].

## 2017-03-08 ENCOUNTER — Encounter: Payer: Self-pay | Admitting: Family Medicine

## 2017-03-08 DIAGNOSIS — R600 Localized edema: Secondary | ICD-10-CM | POA: Insufficient documentation

## 2017-03-08 NOTE — Assessment & Plan Note (Signed)
Deteriorated. Patient re-educated about  the importance of commitment to a  minimum of 150 minutes of exercise per week.  The importance of healthy food choices with portion control discussed. Encouraged to start a food diary, count calories and to consider  joining a support group. Sample diet sheets offered. Goals set by the patient for the next several months.   Weight /BMI 03/02/2017 12/02/2016 06/01/2016  WEIGHT 274 lb 264 lb 251 lb  HEIGHT 5\' 6"  5\' 6"  5\' 6"   BMI 44.22 kg/m2 42.61 kg/m2 40.51 kg/m2

## 2017-03-08 NOTE — Progress Notes (Signed)
Julie Ryan     MRN: [id number]      DOB: [birth year]   HPI [name] is here for follow up and re-evaluation of chronic medical conditions, medication management and review of any available recent lab and radiology data.  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 Denies sinus pressure, nasal congestion, ear pain or sore throat. Denies chest congestion, productive cough or wheezing. Denies chest pains, palpitations Denies abdominal pain, nausea, vomiting,diarrhea or constipation.   Denies dysuria, frequency, hesitancy or incontinence. Denies joint pain, swelling and limitation in mobility. Denies headaches, seizures, numbness, or tingling.  Denies skin break down or rash.   PE  BP (!) 158/90   Pulse 84   Resp 16   Ht 5\' 6"  (1.676 m)   Wt 274 lb (124.3 kg)   SpO2 97%   BMI 44.22 kg/m   Patient alert and oriented and in no cardiopulmonary distress.  HEENT: No facial asymmetry, EOMI,   oropharynx pink and moist.  Neck supple no JVD, no mass.  Chest: Clear to auscultation bilaterally.  CVS: S1, S2 no murmurs, no S3.Regular rate.  ABD: Soft non tender.   Ext: one plus pitting edema  MS: Adequate ROM spine, shoulders, hips and knees.  Skin: Intact, no ulcerations or rash noted.  Psych: Good eye contact, normal affect. Memory intact  Mildly  anxious and  depressed appearing.  CNS: CN 2-12 intact, power,  normal  throughout.no focal deficits noted.   Assessment & Plan  Essential hypertension Uncontroled, needs to take medication as prescribed and also focus on weight loss and low sodium diet D/c phentermine DASH diet and commitment to daily physical activity for a minimum of 30 minutes discussed and encouraged, as a part of hypertension management. The importance of attaining a healthy weight is also discussed.  BP/Weight 03/02/2017 12/02/2016 06/01/2016 03/17/2016 12/17/2015 11/30/2015 02/11/1750  Systolic BP 025 - 852 778 242 353 614  Diastolic BP 90 - 84 77 80 59 76  Wt. (Lbs) 274 264 251 - 245.12 240 250  BMI 44.22 42.61 40.51 - 39.58 38.76 39.75       MORBID OBESITY Deteriorated. Patient re-educated about  the importance of commitment to a  minimum of 150 minutes of exercise per week.  The importance of healthy food choices with portion control discussed. Encouraged to start a food diary, count calories and to consider  joining a support group. Sample diet sheets offered. Goals set by the patient for the next several months.   Weight /BMI 03/02/2017 12/02/2016 06/01/2016  WEIGHT 274 lb 264 lb 251 lb  HEIGHT 5\' 6"  5\' 6"  5\' 6"   BMI 44.22 kg/m2 42.61 kg/m2 40.51 kg/m2      Hyperlipidemia Hyperlipidemia:Low fat diet discussed and encouraged.   Lipid Panel  Lab Results  Component Value Date   CHOL 163 06/01/2016   HDL 61 06/01/2016   LDLCALC 85 06/01/2016   TRIG 83 06/01/2016  CHOLHDL 2.7 06/01/2016       GAD (generalized anxiety disorder) Currently uncontrolled due to severe economic challenge, states expects this to improve  Prediabetes Patient educated about the importance of limiting  Carbohydrate intake , the need to commit to daily physical activity for a minimum of 30 minutes , and to commit weight loss. The fact that changes in all these areas will reduce or eliminate all together the development of diabetes is stressed.  Updated lab needed at/ before next  visit.   Diabetic Labs Latest Ref Rng & Units 12/02/2016 06/01/2016 12/16/2015 07/30/2015 12/27/2014  HbA1c <5.7 % 5.9(H) 5.7(H) 5.9(H) 6.1(H) 6.0(H)  Chol <200 mg/dL - 163 - 214(H) 187  HDL >50 mg/dL - 61 - 63 50  Calc LDL <100 mg/dL - 85 - 134(H) 121(H)  Triglycerides <150 mg/dL - 83 - 86 78  Creatinine 0.50 - 1.05 mg/dL 0.92 0.99 - 0.82 0.80   BP/Weight 03/02/2017 12/02/2016 06/01/2016 03/17/2016 12/17/2015 11/30/2015 1/61/0960  Systolic BP 454 - 098 119 147 829 562  Diastolic BP 90 - 84 77 80 59 76  Wt. (Lbs) 274 264 251 - 245.12 240 250  BMI 44.22 42.61 40.51 - 39.58 38.76 39.75   Foot/eye exam completion dates Latest Ref Rng & Units 12/09/2016  Eye Exam No Retinopathy No Retinopathy  Foot Form Completion - -      Leg edema Start lasix with potassium as needed, short term for leg swelling. Elevate leg and educe sodium intake

## 2017-03-08 NOTE — Assessment & Plan Note (Signed)
Hyperlipidemia:Low fat diet discussed and encouraged.   Lipid Panel  Lab Results  Component Value Date   CHOL 163 06/01/2016   HDL 61 06/01/2016   LDLCALC 85 06/01/2016   TRIG 83 06/01/2016   CHOLHDL 2.7 06/01/2016

## 2017-03-08 NOTE — Assessment & Plan Note (Signed)
Uncontroled, needs to take medication as prescribed and also focus on weight loss and low sodium diet D/c phentermine DASH diet and commitment to daily physical activity for a minimum of 30 minutes discussed and encouraged, as a part of hypertension management. The importance of attaining a healthy weight is also discussed.  BP/Weight 03/02/2017 12/02/2016 06/01/2016 03/17/2016 12/17/2015 11/30/2015 9/75/8832  Systolic BP 549 - 826 415 830 940 768  Diastolic BP 90 - 84 77 80 59 76  Wt. (Lbs) 274 264 251 - 245.12 240 250  BMI 44.22 42.61 40.51 - 39.58 38.76 39.75

## 2017-03-08 NOTE — Assessment & Plan Note (Signed)
Currently uncontrolled due to severe economic challenge, states expects this to improve

## 2017-03-08 NOTE — Assessment & Plan Note (Signed)
Patient educated about the importance of limiting  Carbohydrate intake , the need to commit to daily physical activity for a minimum of 30 minutes , and to commit weight loss. The fact that changes in all these areas will reduce or eliminate all together the development of diabetes is stressed.  Updated lab needed at/ before next visit.   Diabetic Labs Latest Ref Rng & Units 12/02/2016 06/01/2016 12/16/2015 07/30/2015 12/27/2014  HbA1c <5.7 % 5.9(H) 5.7(H) 5.9(H) 6.1(H) 6.0(H)  Chol <200 mg/dL - 163 - 214(H) 187  HDL >50 mg/dL - 61 - 63 50  Calc LDL <100 mg/dL - 85 - 134(H) 121(H)  Triglycerides <150 mg/dL - 83 - 86 78  Creatinine 0.50 - 1.05 mg/dL 0.92 0.99 - 0.82 0.80   BP/Weight 03/02/2017 12/02/2016 06/01/2016 03/17/2016 12/17/2015 11/30/2015 03/21/6059  Systolic BP 045 - 997 741 423 953 202  Diastolic BP 90 - 84 77 80 59 76  Wt. (Lbs) 274 264 251 - 245.12 240 250  BMI 44.22 42.61 40.51 - 39.58 38.76 39.75   Foot/eye exam completion dates Latest Ref Rng & Units 12/09/2016  Eye Exam No Retinopathy No Retinopathy  Foot Form Completion - -

## 2017-03-08 NOTE — Assessment & Plan Note (Signed)
Start lasix with potassium as needed, short term for leg swelling. Elevate leg and educe sodium intake

## 2017-03-22 ENCOUNTER — Telehealth: Payer: Self-pay | Admitting: Family Medicine

## 2017-03-22 NOTE — Telephone Encounter (Signed)
Patient's form is completed with the info I have available. Please copy for scanning , and have her collect the original. I tried to call her , I have left it in your box

## 2017-03-23 NOTE — Telephone Encounter (Signed)
Patient informed of message below, verbalized understanding.  

## 2017-03-29 ENCOUNTER — Telehealth: Payer: Self-pay | Admitting: Family Medicine

## 2017-03-29 NOTE — Telephone Encounter (Signed)
Patient is requesting to speak to you about her immunization record  Cb#: [phone]

## 2017-03-30 ENCOUNTER — Telehealth: Payer: Self-pay | Admitting: Family Medicine

## 2017-03-30 DIAGNOSIS — Z0184 Encounter for antibody response examination: Secondary | ICD-10-CM

## 2017-03-30 NOTE — Telephone Encounter (Signed)
Hep B titre ordered

## 2017-03-30 NOTE — Telephone Encounter (Signed)
Patient came by office this morning to discuss.

## 2017-03-31 LAB — MEASLES/MUMPS/RUBELLA IMMUNITY
MUMPS IGG: 13.1 [AU]/ml
Rubella: <0.9 index — ABNORMAL LOW
Rubeola IgG: <25 AU/mL — ABNORMAL LOW

## 2017-03-31 LAB — HEPATITIS B SURFACE ANTIBODY, QUANTITATIVE: Hepatitis B-Post: <5 m[IU]/mL — ABNORMAL LOW (ref >=10)

## 2017-04-01 ENCOUNTER — Telehealth: Payer: Self-pay | Admitting: Family Medicine

## 2017-04-01 NOTE — Telephone Encounter (Signed)
-----   Message from Caren Macadam, MD sent at 03/31/2017 11:08 AM EDT ----- Please advise that she is not immune to hepatitis B virus.

## 2017-04-01 NOTE — Telephone Encounter (Signed)
May I also advise patient on MMR titer results in chart?  Also, can she get MMR vaccine as an adult?

## 2017-04-04 NOTE — Telephone Encounter (Signed)
Per Dr. Simpson, patient not immune to MMR, called and left message on voicemail to get immunizations. 

## 2017-04-04 NOTE — Telephone Encounter (Signed)
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

## 2017-04-04 NOTE — Telephone Encounter (Signed)
She will need Hepatitis B vaccine just one, this is a correction , I will write on script

## 2017-04-05 ENCOUNTER — Ambulatory Visit: Payer: BC Managed Care – PPO | Admitting: Family Medicine

## 2017-04-25 ENCOUNTER — Telehealth: Payer: Self-pay | Admitting: Family Medicine

## 2017-04-25 NOTE — Telephone Encounter (Signed)
Patient is requesting a TB skin test today Cb# [phone]

## 2017-04-26 ENCOUNTER — Encounter: Payer: Self-pay | Admitting: Family Medicine

## 2017-04-26 ENCOUNTER — Ambulatory Visit: Payer: BC Managed Care – PPO

## 2017-04-26 NOTE — Telephone Encounter (Signed)
Patient can come today, but only if she is willing/able to come back Thursday-Friday for reading.

## 2017-05-04 ENCOUNTER — Other Ambulatory Visit: Payer: Self-pay | Admitting: Podiatry

## 2017-05-04 ENCOUNTER — Other Ambulatory Visit: Payer: Self-pay | Admitting: Family Medicine

## 2017-05-04 DIAGNOSIS — M79641 Pain in right hand: Secondary | ICD-10-CM

## 2017-05-12 ENCOUNTER — Telehealth (INDEPENDENT_AMBULATORY_CARE_PROVIDER_SITE_OTHER): Payer: Self-pay | Admitting: *Deleted

## 2017-05-12 ENCOUNTER — Encounter (INDEPENDENT_AMBULATORY_CARE_PROVIDER_SITE_OTHER): Payer: Self-pay | Admitting: *Deleted

## 2017-05-12 NOTE — Telephone Encounter (Signed)
Patient needs trilyte 

## 2017-05-16 MED ORDER — PEG 3350-KCL-NA BICARB-NACL 420 G PO SOLR: 4000.0000 mL | Freq: Once | ORAL | 0 refills | Status: AC

## 2017-05-17 ENCOUNTER — Telehealth (INDEPENDENT_AMBULATORY_CARE_PROVIDER_SITE_OTHER): Payer: Self-pay | Admitting: *Deleted

## 2017-05-17 NOTE — Telephone Encounter (Signed)
agree

## 2017-05-17 NOTE — Telephone Encounter (Signed)
Referring MD/PCP: simpson   Procedure: tcs  Reason/Indication:  screening  Has patient had this procedure before?  no  If so, when, by whom and where?    Is there a family history of colon cancer?  no  Who?  What age when diagnosed?    Is patient diabetic?   borderline      Does patient have prosthetic heart valve or mechanical valve?  no  Do you have a pacemaker?  no  Has patient ever had endocarditis? no  Has patient had joint replacement within last 12 months?  no  Is patient constipated or take laxatives? no  Does patient have a history of alcohol/drug use?   no  Is patient on Coumadin, Plavix and/or Aspirin? no  Medications: see epic  Allergies: see epci  Medication Adjustment per Dr Laural Golden: hold metformin morning of procedure  Procedure date & time: 06/15/17 at 830

## 2017-06-15 ENCOUNTER — Encounter (HOSPITAL_COMMUNITY): Payer: Self-pay | Admitting: *Deleted

## 2017-06-15 ENCOUNTER — Encounter (HOSPITAL_COMMUNITY)
Admission: RE | Disposition: A | Discharge status: Home / Self Care | Payer: Self-pay | Source: Ambulatory Visit | Attending: Internal Medicine

## 2017-06-15 ENCOUNTER — Ambulatory Visit (HOSPITAL_COMMUNITY)
Admission: RE | Admit: 2017-06-15 | Discharge: 2017-06-15 | Disposition: A | Discharge status: Home / Self Care | Payer: BC Managed Care – PPO | Source: Ambulatory Visit | Attending: Internal Medicine | Admitting: Internal Medicine

## 2017-06-15 ENCOUNTER — Other Ambulatory Visit: Payer: Self-pay

## 2017-06-15 DIAGNOSIS — I10 Essential (primary) hypertension: Secondary | ICD-10-CM | POA: Diagnosis not present

## 2017-06-15 DIAGNOSIS — F329 Major depressive disorder, single episode, unspecified: Secondary | ICD-10-CM | POA: Diagnosis not present

## 2017-06-15 DIAGNOSIS — F419 Anxiety disorder, unspecified: Secondary | ICD-10-CM | POA: Insufficient documentation

## 2017-06-15 DIAGNOSIS — R7303 Prediabetes: Secondary | ICD-10-CM | POA: Diagnosis not present

## 2017-06-15 DIAGNOSIS — G8929 Other chronic pain: Secondary | ICD-10-CM | POA: Diagnosis not present

## 2017-06-15 DIAGNOSIS — Z91040 Latex allergy status: Secondary | ICD-10-CM | POA: Insufficient documentation

## 2017-06-15 DIAGNOSIS — Z888 Allergy status to other drugs, medicaments and biological substances status: Secondary | ICD-10-CM | POA: Insufficient documentation

## 2017-06-15 DIAGNOSIS — E669 Obesity, unspecified: Secondary | ICD-10-CM | POA: Insufficient documentation

## 2017-06-15 DIAGNOSIS — Z87891 Personal history of nicotine dependence: Secondary | ICD-10-CM | POA: Insufficient documentation

## 2017-06-15 DIAGNOSIS — Z6841 Body Mass Index (BMI) 40.0 and over, adult: Secondary | ICD-10-CM | POA: Insufficient documentation

## 2017-06-15 DIAGNOSIS — Z7984 Long term (current) use of oral hypoglycemic drugs: Secondary | ICD-10-CM | POA: Diagnosis not present

## 2017-06-15 DIAGNOSIS — Z885 Allergy status to narcotic agent status: Secondary | ICD-10-CM | POA: Insufficient documentation

## 2017-06-15 DIAGNOSIS — Z79899 Other long term (current) drug therapy: Secondary | ICD-10-CM | POA: Insufficient documentation

## 2017-06-15 DIAGNOSIS — K648 Other hemorrhoids: Secondary | ICD-10-CM | POA: Diagnosis not present

## 2017-06-15 DIAGNOSIS — Z1211 Encounter for screening for malignant neoplasm of colon: Secondary | ICD-10-CM | POA: Diagnosis not present

## 2017-06-15 DIAGNOSIS — K644 Residual hemorrhoidal skin tags: Secondary | ICD-10-CM | POA: Diagnosis not present

## 2017-06-15 HISTORY — PX: COLONOSCOPY: SHX5424

## 2017-06-15 LAB — GLUCOSE, CAPILLARY: GLUCOSE-CAPILLARY: 109 mg/dL — AB (ref 65–99)

## 2017-06-15 SURGERY — COLONOSCOPY
Anesthesia: Moderate Sedation

## 2017-06-15 MED ORDER — MEPERIDINE HCL 50 MG/ML IJ SOLN
INTRAMUSCULAR | Status: DC | PRN
  Administered 2017-06-15 (×2): 25 mg via INTRAVENOUS

## 2017-06-15 MED ORDER — MIDAZOLAM HCL 5 MG/5ML IJ SOLN
INTRAMUSCULAR | Status: DC | PRN
  Administered 2017-06-15 (×2): 1 mg via INTRAVENOUS
  Administered 2017-06-15 (×2): 2 mg via INTRAVENOUS
  Administered 2017-06-15: 1 mg via INTRAVENOUS

## 2017-06-15 MED ORDER — MIDAZOLAM HCL 5 MG/5ML IJ SOLN
INTRAMUSCULAR | Status: AC
  Filled 2017-06-15: qty 10

## 2017-06-15 MED ORDER — SODIUM CHLORIDE 0.9 % IV SOLN
INTRAVENOUS | Status: DC
  Administered 2017-06-15: 1000 mL via INTRAVENOUS

## 2017-06-15 MED ORDER — MEPERIDINE HCL 50 MG/ML IJ SOLN
INTRAMUSCULAR | Status: AC
  Filled 2017-06-15: qty 1

## 2017-06-15 NOTE — Op Note (Addendum)
[facility] Patient Name: Julie Ryan Procedure Date: 06/15/2017 8:24 AM MRN: [id number] Date of Birth: 1965-08-03 Attending MD: Hildred Laser , MD CSN: [id number] Age: 51 Admit Type: Outpatient Procedure:                Colonoscopy Indications:              Screening for colorectal malignant neoplasm Providers:                Hildred Laser, MD, Janeece Riggers, RN, Lurline Del, RN Referring MD:             Norwood Levo. Moshe Cipro, MD Medicines:                Midazolam 7 mg IV, Meperidine 50 mg IV Complications:            No immediate complications. Estimated Blood Loss:     Estimated blood loss: none. Procedure:                Pre-Anesthesia Assessment:                           - Prior to the procedure, a History and Physical                            was performed, and patient medications and                            allergies were reviewed. The patient's tolerance of                            previous anesthesia was also reviewed. The risks                            and benefits of the procedure and the sedation                            options and risks were discussed with the patient.                            All questions were answered, and informed consent                            was obtained. Prior Anticoagulants: The patient has                            taken no previous anticoagulant or antiplatelet                            agents. ASA Grade Assessment: II - A patient with                            mild systemic disease. After reviewing the risks                            and benefits, the patient was deemed in  satisfactory condition to undergo the procedure.                           After obtaining informed consent, the colonoscope                            was passed under direct vision. Throughout the                            procedure, the patient's blood pressure, pulse, and                            oxygen saturations  were monitored continuously. The                            [id number] ([id number]) scope was introduced through                            the anus and advanced to the the cecum, identified                            by appendiceal orifice and ileocecal valve. The                            colonoscopy was performed without difficulty. The                            patient tolerated the procedure well. The quality                            of the bowel preparation was good. The ileocecal                            valve, appendiceal orifice, and rectum were                            photographed. Scope In: 8:42:52 AM Scope Out: 9:07:08 AM Scope Withdrawal Time: 0 hours 10 minutes 2 seconds  Total Procedure Duration: 0 hours 24 minutes 16 seconds  Findings:      The perianal and digital rectal examinations were normal.      The colon (entire examined portion) appeared normal.      External and internal hemorrhoids were found during retroflexion. The       hemorrhoids were medium-sized. Impression:               - The entire examined colon is normal.                           - External and internal hemorrhoids.                           - No specimens collected. Moderate Sedation:      Moderate (conscious) sedation was administered by the endoscopy nurse       and supervised by the endoscopist. The following parameters were  monitored: oxygen saturation, heart rate, blood pressure, CO2       capnography and response to care. Total physician intraservice time was       31 minutes. Recommendation:           - Patient has a contact number available for                            emergencies. The signs and symptoms of potential                            delayed complications were discussed with the                            patient. Return to normal activities tomorrow.                            Written discharge instructions were provided to the                             patient.                           - Resume previous diet today.                           - Continue present medications.                           - Repeat colonoscopy in 10 years for screening                            purposes. Procedure Code(s):        --- Professional ---                           [phone], Colonoscopy, flexible; diagnostic, including                            collection of specimen(s) by brushing or washing,                            when performed (separate procedure)                           99152, Moderate sedation services provided by the                            same physician or other qualified health care                            professional performing the diagnostic or                            therapeutic service that the sedation supports,  requiring the presence of an independent trained                            observer to assist in the monitoring of the                            patient's level of consciousness and physiological                            status; initial 15 minutes of intraservice time,                            patient age 77 years or older                           [phone], Moderate sedation services; each additional                            15 minutes intraservice time Diagnosis Code(s):        --- Professional ---                           Z12.11, Encounter for screening for malignant                            neoplasm of colon                           K64.4, Residual hemorrhoidal skin tags CPT copyright 2016 American [employer]. All rights reserved. The codes documented in this report are preliminary and upon coder review may  be revised to meet current compliance requirements. Hildred Laser, MD Hildred Laser, MD 06/15/2017 9:17:09 AM This report has been signed electronically. Number of Addenda: 0

## 2017-06-15 NOTE — H&P (Signed)
Julie Ryan is an 51 y.o. female.   Chief Complaint: Patient is here for colonoscopy. HPI: Patient is a 51 year old african American female who is here for screening colonoscopy.  She denies abdominal pain change in bowel habits or rectal bleeding. Family history is negative for CRC.  Past Medical History:  Diagnosis Date  . Anxiety   . Carpal tunnel syndrome of right wrist   . Chronic knee pain   . Depression   . Hypertension   . Obesity   . Prediabetes     Past Surgical History:  Procedure Laterality Date  . APPENDECTOMY    . CARPAL TUNNEL RELEASE Right 08/01/2015   Procedure: RIGHT CARPAL TUNNEL RELEASE;  Surgeon: Carole Civil, MD;  Location: [facility];  Service: Orthopedics;  Laterality: Right;  . ESOPHAGOGASTRODUODENOSCOPY N/A 05/25/2013   Procedure: ESOPHAGOGASTRODUODENOSCOPY (EGD);  Surgeon: Rogene Houston, MD;  Location: [facility];  Service: Endoscopy;  Laterality: N/A;  220  . TUBAL LIGATION      Family History  Problem Relation Age of Onset  . Hypertension Mother   . Heart disease Mother   . Diabetes Mother   . Diabetes Sister   . Multiple sclerosis Sister   . Lupus Sister   . Heart disease Sister   . CVA Sister   . Diabetes Brother   . Hypertension Brother   . Hypertension Daughter    Social History:  reports that she quit smoking about 2 years ago. Her smoking use included cigarettes. She smoked 0.25 packs per day. she has never used smokeless tobacco. She reports that she does not drink alcohol or use drugs.  Allergies:  Allergies  Allergen Reactions  . Codeine Nausea Only and Other (See Comments)    Dizziness   . Effexor Xr [Venlafaxine Hcl Er]     Makes patient feel on edge   . Fluoxetine     States that med caused memory problems and crying spells   . Hydrocodone Nausea Only and Other (See Comments)    Dizziness   . Latex Itching and Rash    Facility-Administered Medications Prior to Admission  Medication Dose Route Frequency Provider  Last Rate Last Dose  . betamethasone acetate-betamethasone sodium phosphate (CELESTONE) injection 12 mg  12 mg Intramuscular Once Daylene Katayama M, DPM      . betamethasone acetate-betamethasone sodium phosphate (CELESTONE) injection 3 mg  3 mg Intramuscular Once Daylene Katayama M, DPM      . betamethasone acetate-betamethasone sodium phosphate (CELESTONE) injection 3 mg  3 mg Intramuscular Once Daylene Katayama M, DPM      . betamethasone acetate-betamethasone sodium phosphate (CELESTONE) injection 3 mg  3 mg Intramuscular Once Daylene Katayama M, DPM      . betamethasone acetate-betamethasone sodium phosphate (CELESTONE) injection 3 mg  3 mg Intramuscular Once Edrick Kins, DPM       Medications Prior to Admission  Medication Sig Dispense Refill  . Black Currant Seed Oil 500 MG CAPS Take 1 capsule by mouth 2 (two) times daily.    . furosemide (LASIX) 20 MG tablet Onen tablet once daily as needed (Patient taking differently: Take 20 mg by mouth daily as needed for edema. Onen tablet once daily as needed) 10 tablet 0  . gabapentin (NEURONTIN) 300 MG capsule TAKE 1 CAPSULE BY MOUTH AT BEDTIME 90 capsule 1  . loratadine (CLARITIN) 10 MG tablet Take 1 tablet (10 mg total) by mouth daily. 30 tablet 11  . lovastatin (MEVACOR) 20 MG tablet  Take 2 tablets (40 mg total) by mouth at bedtime. 180 tablet 1  . metFORMIN (GLUCOPHAGE-XR) 500 MG 24 hr tablet TAKE ONE TABLET BY MOUTH ONCE DAILY WITH BREAKFAST 90 tablet 1  . metoprolol tartrate (LOPRESSOR) 25 MG tablet TAKE ONE TABLET BY MOUTH TWICE DAILY FOR BLOOD PRESSURE 180 tablet 1  . NONFORMULARY OR COMPOUNDED ITEM Apply 1-2 g topically 4 (four) times daily. 120 each 2  . potassium chloride SA (K-DUR,KLOR-CON) 20 MEQ tablet One tablet once daily as needed on days when you take the lasix 10 tablet 0  . ranitidine (ZANTAC) 150 MG tablet TAKE ONE TABLET BY MOUTH TWICE DAILY 180 tablet 1  . meloxicam (MOBIC) 15 MG tablet Take 1 tablet (15 mg total) by mouth daily. 60  tablet 2    Results for orders placed or performed during the hospital encounter of 06/15/17 (from the past 48 hour(s))  Glucose, capillary     Status: Abnormal   Collection Time: 06/15/17  8:06 AM  Result Value Ref Range   Glucose-Capillary 109 (H) 65 - 99 mg/dL   No results found.  ROS  Blood pressure (!) 145/69, pulse 82, temperature 98.6 F (37 C), temperature source Oral, resp. rate 19, height 5\' 6"  (1.676 m), weight 269 lb (122 kg), last menstrual period 06/10/2017, SpO2 97 %. Physical Exam  Constitutional: She appears well-developed and well-nourished.  HENT:  Mouth/Throat: Oropharynx is clear and moist.  Eyes: Conjunctivae are normal. No scleral icterus.  Neck: No thyromegaly present.  Cardiovascular: Normal rate and regular rhythm.  Murmur heard. Faint systolic murmur heard at left sternal border and aortic area.  Respiratory: Effort normal and breath sounds normal.  GI: Soft. She exhibits no distension and no mass. There is no tenderness.  Abdomen is full. lower midline scar.  Musculoskeletal: She exhibits no edema.  Lymphadenopathy:    She has no cervical adenopathy.  Neurological: She is alert.  Skin: Skin is warm and dry.     Assessment/Plan Average risk screening colonoscopy.  Hildred Laser, MD 06/15/2017, 8:31 AM

## 2017-06-15 NOTE — Discharge Instructions (Signed)
Resume usual medications and diet. No driving for 24 hours. Next screening exam in 10 years.     Colonoscopy, Adult, Care After This sheet gives you information about how to care for yourself after your procedure. Your doctor may also give you more specific instructions. If you have problems or questions, call your doctor. Follow these instructions at home: General instructions   For the first 24 hours after the procedure: ? Do not drive or use machinery. ? Do not sign important documents. ? Do not drink alcohol. ? Do your daily activities more slowly than normal. ? Eat foods that are soft and easy to digest. ? Rest often.  Take over-the-counter or prescription medicines only as told by your doctor.  It is up to you to get the results of your procedure. Ask your doctor, or the department performing the procedure, when your results will be ready. To help cramping and bloating:  Try walking around.  Put heat on your belly (abdomen) as told by your doctor. Use a heat source that your doctor recommends, such as a moist heat pack or a heating pad. ? Put a towel between your skin and the heat source. ? Leave the heat on for 20-30 minutes. ? Remove the heat if your skin turns bright red. This is especially important if you cannot feel pain, heat, or cold. You can get burned. Eating and drinking  Drink enough fluid to keep your pee (urine) clear or pale yellow.  Return to your normal diet as told by your doctor. Avoid heavy or fried foods that are hard to digest.  Avoid drinking alcohol for as long as told by your doctor. Contact a doctor if:  You have blood in your poop (stool) 2-3 days after the procedure. Get help right away if:  You have more than a small amount of blood in your poop.  You see large clumps of tissue (blood clots) in your poop.  Your belly is swollen.  You feel sick to your stomach (nauseous).  You throw up (vomit).  You have a fever.  You have  belly pain that gets worse, and medicine does not help your pain. This information is not intended to replace advice given to you by your health care provider. Make sure you discuss any questions you have with your health care provider. Document Released: 07/24/2010 Document Revised: 03/15/2016 Document Reviewed: 03/15/2016 Elsevier Interactive Patient Education  2017 [employer]  Hemorrhoids Hemorrhoids are swollen veins in and around the rectum or anus. Hemorrhoids can cause pain, itching, or bleeding. Most of the time, they do not cause serious problems. They usually get better with diet changes, lifestyle changes, and other home treatments. Follow these instructions at home: Eating and drinking  Eat foods that have fiber, such as whole grains, beans, nuts, fruits, and vegetables. Ask your doctor about taking products that have added fiber (fibersupplements).  Drink enough fluid to keep your pee (urine) clear or pale yellow. For Pain and Swelling  Take a warm-water bath (sitz bath) for 20 minutes to ease pain. Do this 3-4 times a day.  If directed, put ice on the painful area. It may be helpful to use ice between your warm baths. ? Put ice in a plastic bag. ? Place a towel between your skin and the bag. ? Leave the ice on for 20 minutes, 2-3 times a day. General instructions  Take over-the-counter and prescription medicines only as told by your doctor. ? Medicated creams and medicines that  are inserted into the anus (suppositories) may be used or applied as told.  Exercise often.  Go to the bathroom when you have the urge to poop (to have a bowel movement). Do not wait.  Avoid pushing too hard (straining) when you poop.  Keep the butt area dry and clean. Use wet toilet paper or moist paper towels.  Do not sit on the toilet for a long time. Contact a doctor if:  You have any of these: ? Pain and swelling that do not get better with treatment or medicine. ? Bleeding that  will not stop. ? Trouble pooping or you cannot poop. ? Pain or swelling outside the area of the hemorrhoids. This information is not intended to replace advice given to you by your health care provider. Make sure you discuss any questions you have with your health care provider. Document Released: 03/30/2008 Document Revised: 11/27/2015 Document Reviewed: 03/05/2015 Elsevier Interactive Patient Education  [employer].

## 2017-06-17 ENCOUNTER — Encounter (HOSPITAL_COMMUNITY): Payer: Self-pay | Admitting: Internal Medicine

## 2017-06-21 ENCOUNTER — Ambulatory Visit (INDEPENDENT_AMBULATORY_CARE_PROVIDER_SITE_OTHER): Payer: BC Managed Care – PPO | Admitting: Family Medicine

## 2017-06-21 ENCOUNTER — Encounter: Payer: Self-pay | Admitting: Family Medicine

## 2017-06-21 VITALS — BP 122/82 | HR 75 | Resp 16 | Ht 66.0 in | Wt 272.0 lb

## 2017-06-21 DIAGNOSIS — M65341 Trigger finger, right ring finger: Secondary | ICD-10-CM

## 2017-06-21 DIAGNOSIS — I10 Essential (primary) hypertension: Secondary | ICD-10-CM | POA: Diagnosis not present

## 2017-06-21 DIAGNOSIS — M5442 Lumbago with sciatica, left side: Secondary | ICD-10-CM

## 2017-06-21 DIAGNOSIS — E785 Hyperlipidemia, unspecified: Secondary | ICD-10-CM

## 2017-06-21 DIAGNOSIS — R7303 Prediabetes: Secondary | ICD-10-CM | POA: Diagnosis not present

## 2017-06-21 DIAGNOSIS — M5441 Lumbago with sciatica, right side: Secondary | ICD-10-CM

## 2017-06-21 DIAGNOSIS — G8929 Other chronic pain: Secondary | ICD-10-CM | POA: Diagnosis not present

## 2017-06-21 DIAGNOSIS — E8881 Metabolic syndrome: Secondary | ICD-10-CM | POA: Diagnosis not present

## 2017-06-21 MED ORDER — PREDNISONE 5 MG (21) PO TBPK: 5.0000 mg | ORAL_TABLET | ORAL | 0 refills | Status: DC

## 2017-06-21 MED ORDER — PHENTERMINE HCL 37.5 MG PO TABS: 37.5000 mg | ORAL_TABLET | Freq: Every day | ORAL | 1 refills | Status: DC

## 2017-06-21 MED ORDER — IBUPROFEN 800 MG PO TABS
800.0000 mg | ORAL_TABLET | Freq: Three times a day (TID) | ORAL | 0 refills | Status: DC | PRN
Start: 2017-06-21 — End: 2017-09-06

## 2017-06-21 MED ORDER — MELOXICAM 15 MG PO TABS: 15.0000 mg | ORAL_TABLET | Freq: Every day | ORAL | 5 refills | Status: DC

## 2017-06-21 MED ORDER — CYCLOBENZAPRINE HCL 10 MG PO TABS: ORAL_TABLET | ORAL | 0 refills | Status: DC

## 2017-06-21 NOTE — Patient Instructions (Addendum)
F/u in 4 month, call if you need me sooner  CONGRATS on smoking cessation  Short course of anti inflammatories and muscle relaxant are sent for back pain and spasm, to be followed by daily meloxicam  CONGRATS on getting all cancer screening tests done   Start phentermine HALF daily , though script says one daily  Weight loss goal of 3 to 4 pounds per month  You are being referred to Dr Aline Brochure re trigger finger  You will get info on bariatric surgery  It is important that you exercise regularly at least 30 minutes 5 times a week. If you develop chest pain, have severe difficulty breathing, or feel very tired, stop exercising immediately and seek medical attention    Thank you  for choosing [facility]. We consider it a privelige to serve you.  Delivering excellent health care in a caring and  compassionate way is our goal.  Partnering with you,  so that together we can achieve this goal is our strategy.

## 2017-06-21 NOTE — Progress Notes (Signed)
Julie Ryan     MRN: [id number]      DOB: 1966-02-23   HPI Julie Ryan is here for follow up and re-evaluation of chronic medical conditions, medication management and review of any available recent lab and radiology data.  Preventive health is updated, specifically  Cancer screening and Immunization.   Questions or concerns regarding consultations or procedures which the PT has had in the interim are  addressed. The PT denies any adverse reactions to current medications since the last visit.  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 Denies sinus pressure, nasal congestion, ear pain or sore throat. Denies chest congestion, productive cough or wheezing. Denies chest pains, palpitations and leg swelling Denies abdominal pain, nausea, vomiting,diarrhea or constipation.   Denies dysuria, frequency, hesitancy or incontinence.  Denies headaches, seizures, numbness, or tingling. Denies depression, anxiety or insomnia. Denies skin break down or rash.   PE  BP 122/82   Pulse 75   Resp 16   Ht 5\' 6"  (1.676 m)   Wt 272 lb (123.4 kg)   LMP 06/10/2017   SpO2 94%   BMI 43.90 kg/m   Patient alert and oriented and in no cardiopulmonary distress.  HEENT: No facial asymmetry, EOMI,   oropharynx pink and moist.  Neck supple no JVD, no mass.  Chest: Clear to auscultation bilaterally.  CVS: S1, S2 no murmurs, no S3.Regular rate.  ABD: Soft non tender.   Ext: No edema  MS: Decreased  ROM lumbar spine, adequate in shoulders, hips and knees. Right ring trigger finger  Skin: Intact, no ulcerations or rash noted.  Psych: Good eye contact, normal affect. Memory intact not anxious or depressed appearing.  CNS: CN 2-12 intact, power,  normal throughout.no focal deficits noted.   Assessment & Plan  Essential hypertension Controlled, no change in medication DASH diet  and commitment to daily physical activity for a minimum of 30 minutes discussed and encouraged, as a part of hypertension management. The importance of attaining a healthy weight is also discussed.  BP/Weight 06/21/2017 06/15/2017 03/02/2017 12/02/2016 06/01/2016 03/17/2016 2/97/9892  Systolic BP 119 417 408 - 144 818 563  Diastolic BP 82 70 90 - 84 77 80  Wt. (Lbs) 272 269 274 264 251 - 245.12  BMI 43.9 43.42 44.22 42.61 40.51 - 39.58       MORBID OBESITY Deteriorated. Patient re-educated about  the importance of commitment to a  minimum of 150 minutes of exercise per week.  The importance of healthy food choices with portion control discussed. Encouraged to start a food diary, count calories and to consider  joining a support group. Sample diet sheets offered. Goals set by the patient for the next several months.   Weight /BMI 06/21/2017 06/15/2017 03/02/2017  WEIGHT 272 lb 269 lb 274 lb  HEIGHT 5\' 6"  5\' 6"  5\' 6"   BMI 43.9 kg/m2 43.42 kg/m2 44.22 kg/m2    DStart half phentermine daily, info also provided on bariatric surgery  Prediabetes Patient educated about the importance of limiting  Carbohydrate intake , the need to commit to daily physical activity for a minimum of 30 minutes , and to commit weight loss. The fact that changes in all these areas will reduce or eliminate all together the development of diabetes is stressed.  Deteriorated Diabetic Labs Latest Ref Rng & Units 06/21/2017 12/02/2016 06/01/2016 12/16/2015  07/30/2015  HbA1c <5.7 % of total Hgb 6.1(H) 5.9(H) 5.7(H) 5.9(H) 6.1(H)  Chol <200 mg/dL 142 - 163 - 214(H)  HDL >50 mg/dL 55 - 61 - 63  Calc LDL <100 mg/dL - - 85 - 134(H)  Triglycerides <150 mg/dL 75 - 83 - 86  Creatinine 0.50 - 1.05 mg/dL 0.82 0.92 0.99 - 0.82   BP/Weight 06/21/2017 06/15/2017 03/02/2017 12/02/2016 06/01/2016 03/17/2016 5/91/6384  Systolic BP 665 993 570 - 177 939 030  Diastolic BP 82 70 90 - 84 77 80  Wt. (Lbs) 272 269 274 264 251 - 245.12    BMI 43.9 43.42 44.22 42.61 40.51 - 39.58   Foot/eye exam completion dates Latest Ref Rng & Units 12/09/2016  Eye Exam No Retinopathy No Retinopathy  Foot Form Completion - -      Hyperlipidemia LDL goal <100 Hyperlipidemia:Low fat diet discussed and encouraged.   Lipid Panel  Lab Results  Component Value Date   CHOL 142 06/21/2017   HDL 55 06/21/2017   LDLCALC 85 06/01/2016   TRIG 75 06/21/2017   CHOLHDL 2.6 06/21/2017   Controlled, no change in medication     Metabolic syndrome X The increased risk of cardiovascular disease associated with this diagnosis, and the need to consistently work on lifestyle to change this is discussed. Following  a  heart healthy diet ,commitment to 30 minutes of exercise at least 5 days per week, as well as control of blood sugar and cholesterol , and achieving a healthy weight are all the areas to be addressed .   Back pain Increased in past 2 weeks , short course of anti inflammatories and muscle relaxant prescribed  Trigger finger, right ring finger 2 month h/o triggering adversely affecting her ability to perform her job, needs ortho eval

## 2017-06-22 LAB — CBC
HEMATOCRIT: 37.8 % (ref 35.0–45.0)
Hemoglobin: 12.5 g/dL (ref 11.7–15.5)
MCH: 28.8 pg (ref 27.0–33.0)
MCHC: 33.1 g/dL (ref 32.0–36.0)
MCV: 87.1 fL (ref 80.0–100.0)
MPV: 9.7 fL (ref 7.5–12.5)
PLATELETS: 329 10*3/uL (ref 140–400)
RBC: 4.34 10*6/uL (ref 3.80–5.10)
RDW: 13.4 % (ref 11.0–15.0)
WBC: 8.2 10*3/uL (ref 3.8–10.8)

## 2017-06-22 LAB — COMPLETE METABOLIC PANEL WITH GFR
AG Ratio: 1.1 (calc) (ref 1.0–2.5)
ALBUMIN MSPROF: 3.8 g/dL (ref 3.6–5.1)
ALKALINE PHOSPHATASE (APISO): 91 U/L (ref 33–130)
ALT: 10 U/L (ref 6–29)
AST: 13 U/L (ref 10–35)
BILIRUBIN TOTAL: 0.6 mg/dL (ref 0.2–1.2)
BUN: 10 mg/dL (ref 7–25)
CHLORIDE: 102 mmol/L (ref 98–110)
CO2: 30 mmol/L (ref 20–32)
Calcium: 9.4 mg/dL (ref 8.6–10.4)
Creat: 0.82 mg/dL (ref 0.50–1.05)
GFR, Est African American: 95 mL/min/{1.73_m2} (ref >=60)
GFR, Est Non African American: 82 mL/min/{1.73_m2} (ref >=60)
GLOBULIN: 3.5 g/dL (ref 1.9–3.7)
Glucose, Bld: 96 mg/dL (ref 65–139)
POTASSIUM: 4.5 mmol/L (ref 3.5–5.3)
SODIUM: 138 mmol/L (ref 135–146)
Total Protein: 7.3 g/dL (ref 6.1–8.1)

## 2017-06-22 LAB — LIPID PANEL
CHOL/HDL RATIO: 2.6 (calc) (ref <5.0)
CHOLESTEROL: 142 mg/dL (ref <200)
HDL: 55 mg/dL (ref >50)
LDL CHOLESTEROL (CALC): 71 mg/dL
Non-HDL Cholesterol (Calc): 87 mg/dL (calc) (ref <130)
TRIGLYCERIDES: 75 mg/dL (ref <150)

## 2017-06-22 LAB — HEMOGLOBIN A1C
EAG (MMOL/L): 7.1 (calc)
Hgb A1c MFr Bld: 6.1 % of total Hgb — ABNORMAL HIGH (ref <5.7)
MEAN PLASMA GLUCOSE: 128 (calc)

## 2017-06-27 ENCOUNTER — Encounter: Payer: Self-pay | Admitting: Family Medicine

## 2017-06-27 DIAGNOSIS — E785 Hyperlipidemia, unspecified: Secondary | ICD-10-CM | POA: Insufficient documentation

## 2017-06-27 DIAGNOSIS — M65341 Trigger finger, right ring finger: Secondary | ICD-10-CM | POA: Insufficient documentation

## 2017-06-27 NOTE — Assessment & Plan Note (Signed)
Increased in past 2 weeks , short course of anti inflammatories and muscle relaxant prescribed

## 2017-06-27 NOTE — Assessment & Plan Note (Signed)
Deteriorated. Patient re-educated about  the importance of commitment to a  minimum of 150 minutes of exercise per week.  The importance of healthy food choices with portion control discussed. Encouraged to start a food diary, count calories and to consider  joining a support group. Sample diet sheets offered. Goals set by the patient for the next several months.   Weight /BMI 06/21/2017 06/15/2017 03/02/2017  WEIGHT 272 lb 269 lb 274 lb  HEIGHT 5\' 6"  5\' 6"  5\' 6"   BMI 43.9 kg/m2 43.42 kg/m2 44.22 kg/m2    DStart half phentermine daily, info also provided on bariatric surgery

## 2017-06-27 NOTE — Assessment & Plan Note (Signed)
The increased risk of cardiovascular disease associated with this diagnosis, and the need to consistently work on lifestyle to change this is discussed. Following  a  heart healthy diet ,commitment to 30 minutes of exercise at least 5 days per week, as well as control of blood sugar and cholesterol , and achieving a healthy weight are all the areas to be addressed .  

## 2017-06-27 NOTE — Assessment & Plan Note (Signed)
Controlled, no change in medication DASH diet and commitment to daily physical activity for a minimum of 30 minutes discussed and encouraged, as a part of hypertension management. The importance of attaining a healthy weight is also discussed.  BP/Weight 06/21/2017 06/15/2017 03/02/2017 12/02/2016 06/01/2016 03/17/2016 0/75/7322  Systolic BP 567 209 198 - 022 179 810  Diastolic BP 82 70 90 - 84 77 80  Wt. (Lbs) 272 269 274 264 251 - 245.12  BMI 43.9 43.42 44.22 42.61 40.51 - 39.58

## 2017-06-27 NOTE — Assessment & Plan Note (Signed)
Patient educated about the importance of limiting  Carbohydrate intake , the need to commit to daily physical activity for a minimum of 30 minutes , and to commit weight loss. The fact that changes in all these areas will reduce or eliminate all together the development of diabetes is stressed.  Deteriorated Diabetic Labs Latest Ref Rng & Units 06/21/2017 12/02/2016 06/01/2016 12/16/2015 07/30/2015  HbA1c <5.7 % of total Hgb 6.1(H) 5.9(H) 5.7(H) 5.9(H) 6.1(H)  Chol <200 mg/dL 142 - 163 - 214(H)  HDL >50 mg/dL 55 - 61 - 63  Calc LDL <100 mg/dL - - 85 - 134(H)  Triglycerides <150 mg/dL 75 - 83 - 86  Creatinine 0.50 - 1.05 mg/dL 0.82 0.92 0.99 - 0.82   BP/Weight 06/21/2017 06/15/2017 03/02/2017 12/02/2016 06/01/2016 03/17/2016 02/09/8109  Systolic BP 315 945 859 - 292 446 286  Diastolic BP 82 70 90 - 84 77 80  Wt. (Lbs) 272 269 274 264 251 - 245.12  BMI 43.9 43.42 44.22 42.61 40.51 - 39.58   Foot/eye exam completion dates Latest Ref Rng & Units 12/09/2016  Eye Exam No Retinopathy No Retinopathy  Foot Form Completion - -

## 2017-06-27 NOTE — Assessment & Plan Note (Signed)
2 month h/o triggering adversely affecting her ability to perform her job, needs ortho eval

## 2017-06-27 NOTE — Assessment & Plan Note (Signed)
Hyperlipidemia:Low fat diet discussed and encouraged.   Lipid Panel  Lab Results  Component Value Date   CHOL 142 06/21/2017   HDL 55 06/21/2017   LDLCALC 85 06/01/2016   TRIG 75 06/21/2017   CHOLHDL 2.6 06/21/2017   Controlled, no change in medication

## 2017-07-18 ENCOUNTER — Encounter: Payer: Self-pay | Admitting: Adult Health

## 2017-07-18 ENCOUNTER — Ambulatory Visit: Payer: BC Managed Care – PPO | Admitting: Adult Health

## 2017-07-18 ENCOUNTER — Encounter (INDEPENDENT_AMBULATORY_CARE_PROVIDER_SITE_OTHER): Payer: Self-pay

## 2017-07-18 VITALS — BP 136/70 | HR 105 | Ht 66.0 in | Wt 266.0 lb

## 2017-07-18 DIAGNOSIS — N951 Menopausal and female climacteric states: Secondary | ICD-10-CM | POA: Diagnosis not present

## 2017-07-18 DIAGNOSIS — N898 Other specified noninflammatory disorders of vagina: Secondary | ICD-10-CM | POA: Insufficient documentation

## 2017-07-18 DIAGNOSIS — F32A Depression, unspecified: Secondary | ICD-10-CM | POA: Insufficient documentation

## 2017-07-18 DIAGNOSIS — F329 Major depressive disorder, single episode, unspecified: Secondary | ICD-10-CM | POA: Insufficient documentation

## 2017-07-18 DIAGNOSIS — N920 Excessive and frequent menstruation with regular cycle: Secondary | ICD-10-CM | POA: Diagnosis not present

## 2017-07-18 MED ORDER — ESTROGENS, CONJUGATED 0.625 MG/GM VA CREA: TOPICAL_CREAM | VAGINAL | 0 refills | Status: DC

## 2017-07-18 NOTE — Patient Instructions (Signed)
Menopause Menopause is the normal time of life when menstrual periods stop completely. Menopause is complete when you have missed 12 consecutive menstrual periods. It usually occurs between the ages of 48 years and 55 years. Very rarely does a woman develop menopause before the age of 40 years. At menopause, your ovaries stop producing the female hormones estrogen and progesterone. This can cause undesirable symptoms and also affect your health. Sometimes the symptoms may occur 4-5 years before the menopause begins. There is no relationship between menopause and:  Oral contraceptives.  Number of children you had.  Race.  The age your menstrual periods started (menarche).  Heavy smokers and very thin women may develop menopause earlier in life. What are the causes?  The ovaries stop producing the female hormones estrogen and progesterone. Other causes include:  Surgery to remove both ovaries.  The ovaries stop functioning for no known reason.  Tumors of the pituitary gland in the brain.  Medical disease that affects the ovaries and hormone production.  Radiation treatment to the abdomen or pelvis.  Chemotherapy that affects the ovaries.  What are the signs or symptoms?  Hot flashes.  Night sweats.  Decrease in sex drive.  Vaginal dryness and thinning of the vagina causing painful intercourse.  Dryness of the skin and developing wrinkles.  Headaches.  Tiredness.  Irritability.  Memory problems.  Weight gain.  Bladder infections.  Hair growth of the face and chest.  Infertility. More serious symptoms include:  Loss of bone (osteoporosis) causing breaks (fractures).  Depression.  Hardening and narrowing of the arteries (atherosclerosis) causing heart attacks and strokes.  How is this diagnosed?  When the menstrual periods have stopped for 12 straight months.  Physical exam.  Hormone studies of the blood. How is this treated? There are many treatment  choices and nearly as many questions about them. The decisions to treat or not to treat menopausal changes is an individual choice made with your health care provider. Your health care provider can discuss the treatments with you. Together, you can decide which treatment will work best for you. Your treatment choices may include:  Hormone therapy (estrogen and progesterone).  Non-hormonal medicines.  Treating the individual symptoms with medicine (for example antidepressants for depression).  Herbal medicines that may help specific symptoms.  Counseling by a psychiatrist or psychologist.  Group therapy.  Lifestyle changes including: ? Eating healthy. ? Regular exercise. ? Limiting caffeine and alcohol. ? Stress management and meditation.  No treatment.  Follow these instructions at home:  Take the medicine your health care provider gives you as directed.  Get plenty of sleep and rest.  Exercise regularly.  Eat a diet that contains calcium (good for the bones) and soy products (acts like estrogen hormone).  Avoid alcoholic beverages.  Do not smoke.  If you have hot flashes, dress in layers.  Take supplements, calcium, and vitamin D to strengthen bones.  You can use over-the-counter lubricants or moisturizers for vaginal dryness.  Group therapy is sometimes very helpful.  Acupuncture may be helpful in some cases. Contact a health care provider if:  You are not sure you are in menopause.  You are having menopausal symptoms and need advice and treatment.  You are still having menstrual periods after age 55 years.  You have pain with intercourse.  Menopause is complete (no menstrual period for 12 months) and you develop vaginal bleeding.  You need a referral to a specialist (gynecologist, psychiatrist, or psychologist) for treatment. Get help right   away if:  You have severe depression.  You have excessive vaginal bleeding.  You fell and think you have a  broken bone.  You have pain when you urinate.  You develop leg or chest pain.  You have a fast pounding heart beat (palpitations).  You have severe headaches.  You develop vision problems.  You feel a lump in your breast.  You have abdominal pain or severe indigestion. This information is not intended to replace advice given to you by your health care provider. Make sure you discuss any questions you have with your health care provider. Document Released: 09/11/2003 Document Revised: 11/27/2015 Document Reviewed: 01/18/2013 Elsevier Interactive Patient Education  2017 Elsevier Inc.  

## 2017-07-18 NOTE — Progress Notes (Addendum)
Subjective:     Patient ID: Julie Ryan, female   DOB: 02/28/1966, 52 y.o.   MRN: [id number]  HPI Julie Ryan is a 52 year old black female, married in complaining of vaginal dryness, spotting, and hot flashes.Last period about 6 months ago but spots randomly.  PCP is Dr Moshe Cipro.   Review of Systems +vaginal dryness Spotting +hot flashes  Reviewed past medical,surgical, social and family history. Reviewed medications and allergies.      Objective:   Physical Exam BP 136/70 (BP Location: Left Arm, Patient Position: Sitting, Cuff Size: Large)   Pulse (!) 105   Ht 5\' 6"  (1.676 m)   Wt 266 lb (120.7 kg)   LMP 12/15/2016 Comment: spotting  BMI 42.93 kg/m   Skin warm and dry.Pelvic: external genitalia is normal in appearance no lesions, vagina: scant discharge without odor,urethra has no lesions or masses noted, cervix:smooth and bulbous, uterus: normal size, shape and contour, non tender, no masses felt, adnexa: no masses or tenderness noted. Bladder is non tender and no masses felt.    PHQ 9 score 11, denies being suicidal or homicidal and declines meds Discussed vaginal estrogen vs luvena and she would like to try vaginal estrogen Will check [place] too.   Assessment:     1. Vaginal dryness   2. Spotting   3. Depression, unspecified depression type   4. Hot flashes due to menopause       Plan:     Check FSH Given 16 gm Premarin vaginal cream, use 0.5 gm in vagina at hs for 2 weeks then 2-3 x weekly F/U in 2 weeks Review handout on menopause

## 2017-07-19 ENCOUNTER — Telehealth: Payer: Self-pay | Admitting: *Deleted

## 2017-07-19 LAB — FOLLICLE STIMULATING HORMONE: FSH: 29.5 m[IU]/mL

## 2017-07-20 ENCOUNTER — Telehealth: Payer: Self-pay | Admitting: Adult Health

## 2017-07-20 NOTE — Telephone Encounter (Signed)
Spoke to patient and informed that unless she is having an outbreak, we could not do swab and serum could give a false positive. Patient states partner went to doctor and they checked urine and blood which did come back positive for herpes. Asked patient if he tested for HSV 1 or 2 and patient did not know.  Stated she would find out and give [place] a call back.

## 2017-07-20 NOTE — Telephone Encounter (Signed)
Left message that [place] 29.5 which is slightly elevated

## 2017-07-20 NOTE — Telephone Encounter (Signed)
LMOVM requesting patient return call.

## 2017-08-01 ENCOUNTER — Encounter: Payer: Self-pay | Admitting: Adult Health

## 2017-08-01 ENCOUNTER — Ambulatory Visit: Payer: BC Managed Care – PPO | Admitting: Adult Health

## 2017-08-01 VITALS — BP 142/88 | HR 102 | Ht 66.0 in | Wt 268.0 lb

## 2017-08-01 DIAGNOSIS — N898 Other specified noninflammatory disorders of vagina: Secondary | ICD-10-CM

## 2017-08-01 DIAGNOSIS — R1031 Right lower quadrant pain: Secondary | ICD-10-CM | POA: Diagnosis not present

## 2017-08-01 MED ORDER — ESTROGENS, CONJUGATED 0.625 MG/GM VA CREA
TOPICAL_CREAM | VAGINAL | 4 refills | Status: DC
Start: 2017-08-01 — End: 2017-10-27

## 2017-08-01 NOTE — Progress Notes (Signed)
Subjective:     Patient ID: Julie Ryan, female   DOB: [birth year], 52 y.o.   MRN: [id number]  HPI Julie Ryan is a 52 year old black female back in follow up of using PVC, still feels dry and spotted after sex.FSH 29.5, partner had pain in testicle and saw his MD and had blood drawn and told he had herpes.She has never had outbreak.   Review of Systems Still feels dry Spotted after sex Reviewed past medical,surgical, social and family history. Reviewed medications and allergies.     Objective:   Physical Exam BP (!) 142/88 (BP Location: Right Arm, Patient Position: Sitting, Cuff Size: Large)   Pulse (!) 102   Ht 5\' 6"  (1.676 m)   Wt 268 lb (121.6 kg)   LMP 07/18/2017   BMI 43.26 kg/m  Skin warm and dry.Pelvic: external genitalia is normal in appearance no lesions, vagina is pink with better moisture,urethra has no lesions or masses noted, cervix:smooth and bulbous, uterus: normal size, shape and contour, mildly tender, no masses felt, adnexa: no masses,+RLQ  tenderness noted. Bladder is non tender and no masses felt.     Assessment:     1. Vaginal dryness   2. RLQ abdominal pain       Plan:     Meds ordered this encounter  Medications  . conjugated estrogens (PREMARIN) vaginal cream    Sig: Use .5 gm in vagina 2-3 x weekly    Dispense:  30 g    Refill:  4    Order Specific Question:   Supervising Provider    Answer:   Tania Ade H [[id number]]  Return in 1 week for GYN [place] Review handout on herpes

## 2017-08-01 NOTE — Patient Instructions (Signed)

## 2017-08-06 ENCOUNTER — Other Ambulatory Visit: Payer: Self-pay | Admitting: Family Medicine

## 2017-08-08 ENCOUNTER — Ambulatory Visit (INDEPENDENT_AMBULATORY_CARE_PROVIDER_SITE_OTHER): Payer: BC Managed Care – PPO

## 2017-08-08 DIAGNOSIS — N888 Other specified noninflammatory disorders of cervix uteri: Secondary | ICD-10-CM | POA: Diagnosis not present

## 2017-08-08 DIAGNOSIS — D251 Intramural leiomyoma of uterus: Secondary | ICD-10-CM

## 2017-08-08 DIAGNOSIS — R1031 Right lower quadrant pain: Secondary | ICD-10-CM

## 2017-08-08 NOTE — Progress Notes (Signed)
PELVIC US TA/TV:heterogeneous anteverted uterus w/mult.fibroids (#1) anterior right intramural fibroid  2.3 x 1.1 x 1.3 cm,(#2) anterior right intramural fibroid 1.1 x 1.1 x.9 cm,(#3) posterior intramural fibroid 1.2 x 1.3 x 1.2 cm, EEC 12.5 mm w/a 2.2 x .9 x 1.8 cm echogenic polyp w/color flow,normal ovaries bilat,ovaries appear mobile,mult nabothian cysts,no free fluid,bilat adnexal pain during ultrasound

## 2017-08-09 ENCOUNTER — Encounter: Payer: Self-pay | Admitting: Adult Health

## 2017-08-09 ENCOUNTER — Telehealth: Payer: Self-pay | Admitting: Adult Health

## 2017-08-09 DIAGNOSIS — D219 Benign neoplasm of connective and other soft tissue, unspecified: Secondary | ICD-10-CM

## 2017-08-09 DIAGNOSIS — N840 Polyp of corpus uteri: Secondary | ICD-10-CM

## 2017-08-09 HISTORY — DX: Polyp of corpus uteri: N84.0

## 2017-08-09 HISTORY — DX: Benign neoplasm of connective and other soft tissue, unspecified: D21.9

## 2017-08-09 NOTE — Telephone Encounter (Signed)
Pt aware US showed multiple small fibroids and endometrial polyp, normal ovaries, make appt with Dr Elonda Husky to discuss polyp removal.

## 2017-08-19 ENCOUNTER — Encounter: Payer: Self-pay | Admitting: Obstetrics & Gynecology

## 2017-08-19 ENCOUNTER — Other Ambulatory Visit: Payer: Self-pay

## 2017-08-19 ENCOUNTER — Ambulatory Visit: Payer: BC Managed Care – PPO | Admitting: Obstetrics & Gynecology

## 2017-08-19 VITALS — BP 156/94 | HR 100 | Ht 66.0 in | Wt 264.0 lb

## 2017-08-19 DIAGNOSIS — N920 Excessive and frequent menstruation with regular cycle: Secondary | ICD-10-CM

## 2017-08-19 DIAGNOSIS — N840 Polyp of corpus uteri: Secondary | ICD-10-CM

## 2017-08-19 NOTE — Progress Notes (Signed)
Follow up appointment for results  Chief Complaint  Patient presents with  . Follow-up    gyn u/s    Blood pressure (!) 156/94, pulse 100, height 5\' 6"  (1.676 m), weight 264 lb (119.7 kg), last menstrual period 08/19/2017.    GYNECOLOGIC SONOGRAM   Julie Ryan is a 52 y.o. G2P2001 LMP 07/18/2017,she is here for a pelvic sonogram for RLQ pain.  Uterus                      8 x 4.3 x 5.2 cm, vol 94 ml,heterogeneous anteverted uterus w/mult.fibroids   Endometrium          12.5 mm, symmetrical, 2.2 x .9 x 1.8 cm echogenic polyp w/color flow  Right ovary             3.2 x 1.6 x 2.5 cm, wnl  Left ovary                2.1 x 2.4 x 1.2 cm, wnl  Fibroids                  (#1) anterior right intramural fibroid  2.3 x 1.1 x 1.3 cm,(#2) anterior right intramural fibroid 1.1 x 1.1 x.9 cm,(#3) posterior intramural fibroid 1.2 x 1.3 x 1.2 cm,  Technician Comments:  PELVIC US TA/TV:heterogeneous anteverted uterus w/mult.fibroids (#1) anterior right intramural fibroid  2.3 x 1.1 x 1.3 cm,(#2) anterior right intramural fibroid 1.1 x 1.1 x.9 cm,(#3) posterior intramural fibroid 1.2 x 1.3 x 1.2 cm, EEC 12.5 mm w/a 2.2 x .9 x 1.8 cm echogenic polyp w/color flow,normal ovaries bilat,ovaries appear mobile,mult nabothian cysts,no free fluid,bilat adnexal pain during ultrasound   Julie Ryan 08/08/2017 11:02 AM  Clinical Impression and recommendations:  I have reviewed the sonogram results above.  Combined with the patient's current clinical course, below are my impressions and any appropriate recommendations for management based on the sonographic findings:  1. Tiny uterus, 94 gm. Thickened endometrium with internal doppler flow suggestive of endometrial polyp. 2  Normal ovaries bilaterally. 3. Endometrial sampling indicated; would consider hysteroscopy to allow removal of the polyp.  [place] ordered this encounter: No orders of the defined types were placed in  this encounter.   Orders for this encounter: No orders of the defined types were placed in this encounter.   Impression: Endometrial polyp  Spotting    Plan: Hysteroscopy D&C polyp removal endometrial ablation, [place]  Follow Up: Return for Post Op, with Dr Elonda Husky.       Face to face time:  10 minutes  Greater than 50% of the visit time was spent in counseling and coordination of care with the patient.  The summary and outline of the counseling and care coordination is summarized in the note above.   All questions were answered.  Past Medical History:  Diagnosis Date  . Anxiety   . Carpal tunnel syndrome of right wrist   . Chronic knee pain   . Depression   . Diabetes mellitus without complication ([place])   . Endometrial polyp 08/09/2017   will get appt with Dr Elonda Husky  . Fibroids 08/09/2017   Has multiple small fibroids   . GERD (gastroesophageal reflux disease)   . Hypertension   . Neuropathy   . Obesity   . Prediabetes   . Seizures ([place])    1 seizure as a chold; unknown etiology and has never been on meds  . Sleep apnea  did not go back to get results    Past Surgical History:  Procedure Laterality Date  . APPENDECTOMY    . CARPAL TUNNEL RELEASE Right 08/01/2015   Procedure: RIGHT CARPAL TUNNEL RELEASE;  Surgeon: Carole Civil, MD;  Location: [facility];  Service: Orthopedics;  Laterality: Right;  . CERVICAL POLYPECTOMY  09/14/2017   Procedure: ENDOMETRIAL POLYPECTOMY;  Surgeon: Florian Buff, MD;  Location: [facility];  Service: Gynecology;;  . COLONOSCOPY N/A 06/15/2017   Procedure: COLONOSCOPY;  Surgeon: Rogene Houston, MD;  Location: [facility];  Service: Endoscopy;  Laterality: N/A;  830  . ENDOMETRIAL ABLATION N/A 09/14/2017   Procedure: ENDOMETRIAL ABLATION( [place]);  Surgeon: Florian Buff, MD;  Location: [facility];  Service: Gynecology;  Laterality: N/A;  . ESOPHAGOGASTRODUODENOSCOPY N/A 05/25/2013   Procedure: ESOPHAGOGASTRODUODENOSCOPY (EGD);   Surgeon: Rogene Houston, MD;  Location: [facility];  Service: Endoscopy;  Laterality: N/A;  220  . HYSTEROSCOPY W/D&C N/A 09/14/2017   Procedure: DILATATION AND CURETTAGE /HYSTEROSCOPY;  Surgeon: Florian Buff, MD;  Location: [facility];  Service: Gynecology;  Laterality: N/A;  . TUBAL LIGATION      OB History    Gravida  2   Para  2   Term  2   Preterm      AB      Living  1     SAB      TAB      Ectopic      Multiple      Live Births  2           Allergies  Allergen Reactions  . Codeine Nausea Only and Other (See Comments)    Dizziness   . Effexor Xr [Venlafaxine Hcl Er] Other (See Comments)    Makes patient feel on edge   . Fluoxetine Other (See Comments)    States that med caused memory problems and crying spells   . Hydrocodone Nausea Only and Other (See Comments)    Dizziness   . Latex Itching and Rash    Social History   Socioeconomic History  . Marital status: Divorced    Spouse name: Not on file  . Number of children: Not on file  . Years of education: Not on file  . Highest education level: Not on file  Occupational History  . Not on file  Social Needs  . Financial resource strain: Not on file  . Food insecurity:    Worry: Not on file    Inability: Not on file  . Transportation needs:    Medical: Not on file    Non-medical: Not on file  Tobacco Use  . Smoking status: Former Smoker    Packs/day: 0.25    Years: 5.00    Pack years: 1.25    Types: Cigarettes    Last attempt to quit: 03/04/2017    Years since quitting: 1.0  . Smokeless tobacco: Never Used  Substance and Sexual Activity  . Alcohol use: No    Alcohol/week: 0.0 standard drinks  . Drug use: No  . Sexual activity: Yes    Birth control/protection: Surgical    Comment: tubal  Lifestyle  . Physical activity:    Days per week: Not on file    Minutes per session: Not on file  . Stress: Not on file  Relationships  . Social connections:    Talks on phone: Not on file     Gets together: Not on file  Attends religious service: Not on file    Active member of club or organization: Not on file    Attends meetings of clubs or organizations: Not on file    Relationship status: Not on file  Other Topics Concern  . Not on file  Social History Narrative  . Not on file    Family History  Problem Relation Age of Onset  . Hypertension Mother   . Heart disease Mother   . Diabetes Mother   . Diabetes Sister   . Multiple sclerosis Sister   . Lupus Sister   . Heart disease Sister   . CVA Sister   . Diabetes Brother   . Hypertension Brother   . Other Son        brain tumor  . Hypertension Daughter   . Diabetes Sister

## 2017-09-02 ENCOUNTER — Other Ambulatory Visit: Payer: Self-pay | Admitting: Family Medicine

## 2017-09-02 NOTE — Patient Instructions (Signed)
Julie Ryan  09/05/2017     @PREFPERIOPPHARMACY @   Your procedure is scheduled on  09/14/2017   Report to [facility] at  705   A.M.  Call this number if you have problems the morning of surgery:  [phone]   Remember:  Do not eat food or drink liquids after midnight.  Take these medicines the morning of surgery with A SIP OF WATER  Claritin, metorpolol, zantac.   Do not wear jewelry, make-up or nail polish.  Do not wear lotions, powders, or perfumes, or deodorant.  Do not shave 48 hours prior to surgery.  Men may shave face and neck.  Do not bring valuables to the hospital.  [facility] is not responsible for any belongings or valuables.  Contacts, dentures or bridgework may not be worn into surgery.  Leave your suitcase in the car.  After surgery it may be brought to your room.  For patients admitted to the hospital, discharge time will be determined by your treatment team.  Patients discharged the day of surgery will not be allowed to drive home.   Name and phone number of your driver:  family Special instructions:  None  Please read over the following fact sheets that you were given. Anesthesia Post-op Instructions and Care and Recovery After Surgery      Hysteroscopy Hysteroscopy is a procedure used for looking inside the womb (uterus). It may be done for various reasons, including:  To evaluate abnormal bleeding, fibroid (benign, noncancerous) tumors, polyps, scar tissue (adhesions), and possibly cancer of the uterus.  To look for lumps (tumors) and other uterine growths.  To look for causes of why a woman cannot get pregnant (infertility), causes of recurrent loss of pregnancy (miscarriages), or a lost intrauterine device (IUD).  To perform a sterilization by blocking the fallopian tubes from inside the uterus.  In this procedure, a thin, flexible tube with a tiny light and camera on the end of it (hysteroscope) is used to look inside  the uterus. A hysteroscopy should be done right after a menstrual period to be sure you are not pregnant. LET [facility] CARE PROVIDER KNOW ABOUT:  Any allergies you have.  All medicines you are taking, including vitamins, herbs, eye drops, creams, and over-the-counter medicines.  Previous problems you or members of your family have had with the use of anesthetics.  Any blood disorders you have.  Previous surgeries you have had.  Medical conditions you have. RISKS AND COMPLICATIONS Generally, this is a safe procedure. However, as with any procedure, complications can occur. Possible complications include:  Putting a hole in the uterus.  Excessive bleeding.  Infection.  Damage to the cervix.  Injury to other organs.  Allergic reaction to medicines.  Too much fluid used in the uterus for the procedure.  BEFORE THE PROCEDURE  Ask your health care provider about changing or stopping any regular medicines.  Do not take aspirin or blood thinners for 1 week before the procedure, or as directed by your health care provider. These can cause bleeding.  If you smoke, do not smoke for 2 weeks before the procedure.  In some cases, a medicine is placed in the cervix the day before the procedure. This medicine makes the cervix have a larger opening (dilate). This makes it easier for the instrument to be inserted into the uterus during the procedure.  Do not eat or drink anything  for at least 8 hours before the surgery.  Arrange for someone to take you home after the procedure. PROCEDURE  You may be given a medicine to relax you (sedative). You may also be given one of the following: ? A medicine that numbs the area around the cervix (local anesthetic). ? A medicine that makes you sleep through the procedure (general anesthetic).  The hysteroscope is inserted through the vagina into the uterus. The camera on the hysteroscope sends a picture to a TV screen. This gives the surgeon a  good view inside the uterus.  During the procedure, air or a liquid is put into the uterus, which allows the surgeon to see better.  Sometimes, tissue is gently scraped from inside the uterus. These tissue samples are sent to a lab for testing. What to expect after the procedure  If you had a general anesthetic, you may be groggy for a couple hours after the procedure.  If you had a local anesthetic, you will be able to go home as soon as you are stable and feel ready.  You may have some cramping. This normally lasts for a couple days.  You may have bleeding, which varies from light spotting for a few days to menstrual-like bleeding for 3-7 days. This is normal.  If your test results are not back during the visit, make an appointment with your health care provider to find out the results. This information is not intended to replace advice given to you by your health care provider. Make sure you discuss any questions you have with your health care provider. Document Released: 09/27/2000 Document Revised: 11/27/2015 Document Reviewed: 01/18/2013 Elsevier Interactive Patient Education  2017 [place]. Hysteroscopy, Care After Refer to this sheet in the next few weeks. These instructions provide you with information on caring for yourself after your procedure. Your health care provider may also give you more specific instructions. Your treatment has been planned according to current medical practices, but problems sometimes occur. Call your health care provider if you have any problems or questions after your procedure. What can I expect after the procedure? After your procedure, it is typical to have the following:  You may have some cramping. This normally lasts for a couple days.  You may have bleeding. This can vary from light spotting for a few days to menstrual-like bleeding for 3-7 days.  Follow these instructions at home:  Rest for the first 1-2 days after the procedure.  Only  take over-the-counter or prescription medicines as directed by your health care provider. Do not take aspirin. It can increase the chances of bleeding.  Take showers instead of baths for 2 weeks or as directed by your health care provider.  Do not drive for 24 hours or as directed.  Do not drink alcohol while taking pain medicine.  Do not use tampons, douche, or have sexual intercourse for 2 weeks or until your health care provider says it is okay.  Take your temperature twice a day for 4-5 days. Write it down each time.  Follow your health care provider's advice about diet, exercise, and lifting.  If you develop constipation, you may: ? Take a mild laxative if your health care provider approves. ? Add bran foods to your diet. ? Drink enough fluids to keep your urine clear or pale yellow.  Try to have someone with you or available to you for the first 24-48 hours, especially if you were given a general anesthetic.  Follow up with  your health care provider as directed. Contact a health care provider if:  You feel dizzy or lightheaded.  You feel sick to your stomach (nauseous).  You have abnormal vaginal discharge.  You have a rash.  You have pain that is not controlled with medicine. Get help right away if:  You have bleeding that is heavier than a normal menstrual period.  You have a fever.  You have increasing cramps or pain, not controlled with medicine.  You have new belly (abdominal) pain.  You pass out.  You have pain in the tops of your shoulders (shoulder strap areas).  You have shortness of breath. This information is not intended to replace advice given to you by your health care provider. Make sure you discuss any questions you have with your health care provider. Document Released: 04/11/2013 Document Revised: 11/27/2015 Document Reviewed: 01/18/2013 Elsevier Interactive Patient Education  2017 [place].  Dilation and Curettage or Vacuum  Curettage Dilation and curettage (D&C) and vacuum curettage are minor procedures. A D&C involves stretching (dilation) the cervix and scraping (curettage) the inside lining of the uterus (endometrium). During a D&C, tissue is gently scraped from the endometrium, starting from the top portion of the uterus down to the lowest part of the uterus (cervix). During a vacuum curettage, the lining and tissue in the uterus are removed with the use of gentle suction. Curettage may be performed to either diagnose or treat a problem. As a diagnostic procedure, curettage is performed to examine tissues from the uterus. A diagnostic curettage may be done if you have:  Irregular bleeding in the uterus.  Bleeding with the development of clots.  Spotting between menstrual periods.  Prolonged menstrual periods or other abnormal bleeding.  Bleeding after menopause.  No menstrual period (amenorrhea).  A change in size and shape of the uterus.  Abnormal endometrial cells discovered during a Pap test.  As a treatment procedure, curettage may be performed for the following reasons:  Removal of an IUD (intrauterine device).  Removal of retained placenta after giving birth.  Abortion.  Miscarriage.  Removal of endometrial polyps.  Removal of uncommon types of noncancerous lumps (fibroids).  Tell a health care provider about:  Any allergies you have, including allergies to prescribed medicine or latex.  All medicines you are taking, including vitamins, herbs, eye drops, creams, and over-the-counter medicines. This is especially important if you take any blood-thinning medicine. Bring a list of all of your medicines to your appointment.  Any problems you or family members have had with anesthetic medicines.  Any blood disorders you have.  Any surgeries you have had.  Your medical history and any medical conditions you have.  Whether you are pregnant or may be pregnant.  Recent vaginal  infections you have had.  Recent menstrual periods, bleeding problems you have had, and what form of birth control (contraception) you use. What are the risks? Generally, this is a safe procedure. However, problems may occur, including:  Infection.  Heavy vaginal bleeding.  Allergic reactions to medicines.  Damage to the cervix or other structures or organs.  Development of scar tissue (adhesions) inside the uterus, which can cause abnormal amounts of menstrual bleeding. This may make it harder to get pregnant in the future.  A hole (perforation) or puncture in the uterine wall. This is rare.  What happens before the procedure? Staying hydrated Follow instructions from your health care provider about hydration, which may include:  Up to 2 hours before the procedure -  you may continue to drink clear liquids, such as water, clear fruit juice, black coffee, and plain tea.  Eating and drinking restrictions Follow instructions from your health care provider about eating and drinking, which may include:  8 hours before the procedure - stop eating heavy meals or foods such as meat, fried foods, or fatty foods.  6 hours before the procedure - stop eating light meals or foods, such as toast or cereal.  6 hours before the procedure - stop drinking milk or drinks that contain milk.  2 hours before the procedure - stop drinking clear liquids. If your health care provider told you to take your medicine(s) on the day of your procedure, take them with only a sip of water.  Medicines  Ask your health care provider about: ? Changing or stopping your regular medicines. This is especially important if you are taking diabetes medicines or blood thinners. ? Taking medicines such as aspirin and ibuprofen. These medicines can thin your blood. Do not take these medicines before your procedure if your health care provider instructs you not to.  You may be given antibiotic medicine to help prevent  infection. General instructions  For 24 hours before your procedure, do not: ? Douche. ? Use tampons. ? Use medicines, creams, or suppositories in the vagina. ? Have sexual intercourse.  You may be given a pregnancy test on the day of the procedure.  Plan to have someone take you home from the hospital or clinic.  You may have a blood or urine sample taken.  If you will be going home right after the procedure, plan to have someone with you for 24 hours. What happens during the procedure?  To reduce your risk of infection: ? Your health care team will wash or sanitize their hands. ? Your skin will be washed with soap.  An IV tube will be inserted into one of your veins.  You will be given one of the following: ? A medicine that numbs the area in and around the cervix (local anesthetic). ? A medicine to make you fall asleep (general anesthetic).  You will lie down on your back, with your feet in foot rests (stirrups).  The size and position of your uterus will be checked.  A lubricated instrument (speculum or Sims retractor) will be inserted into the back side of your vagina. The speculum will be used to hold apart the walls of your vagina so your health care provider can see your cervix.  A tool (tenaculum) will be attached to the lip of the cervix to stabilize it.  Your cervix will be softened and dilated. This may be done by: ? Taking a medicine. ? Having tapered dilators or thin rods (laminaria) or gradual widening instruments (tapered dilators) inserted into your cervix.  A small, sharp, curved instrument (curette) will be used to scrape a small amount of tissue or cells from the endometrium or cervical canal. In some cases, gentle suction is applied with the curette. The curette will then be removed. The cells will be taken to a lab for testing. The procedure may vary among health care providers and hospitals. What happens after the procedure?  You may have mild  cramping, backache, pain, and light bleeding or spotting. You may pass small blood clots from your vagina.  You may have to wear compression stockings. These stockings help to prevent blood clots and reduce swelling in your legs.  Your blood pressure, heart rate, breathing rate, and blood oxygen level  will be monitored until the medicines you were given have worn off. Summary  Dilation and curettage (D&C) involves stretching (dilation) the cervix and scraping (curettage) the inside lining of the uterus (endometrium).  After the procedure, you may have mild cramping, backache, pain, and light bleeding or spotting. You may pass small blood clots from your vagina.  Plan to have someone take you home from the hospital or clinic. This information is not intended to replace advice given to you by your health care provider. Make sure you discuss any questions you have with your health care provider. Document Released: 06/21/2005 Document Revised: 03/07/2016 Document Reviewed: 03/07/2016 Elsevier Interactive Patient Education  2018 [employer].  Dilation and Curettage or Vacuum Curettage, Care After These instructions give you information about caring for yourself after your procedure. Your doctor may also give you more specific instructions. Call your doctor if you have any problems or questions after your procedure. Follow these instructions at home: Activity  Do not drive or use heavy machinery while taking prescription pain medicine.  For 24 hours after your procedure, avoid driving.  Take short walks often, followed by rest periods. Ask your doctor what activities are safe for you. After one or two days, you may be able to return to your normal activities.  Do not lift anything that is heavier than 10 lb (4.5 kg) until your doctor approves.  For at least 2 weeks, or as long as told by your doctor: ? Do not douche. ? Do not use tampons. ? Do not have sex. General instructions  Take  over-the-counter and prescription medicines only as told by your doctor. This is very important if you take blood thinning medicine.  Do not take baths, swim, or use a hot tub until your doctor approves. Take showers instead of baths.  Wear compression stockings as told by your doctor.  It is up to you to get the results of your procedure. Ask your doctor when your results will be ready.  Keep all follow-up visits as told by your doctor. This is important. Contact a doctor if:  You have very bad cramps that get worse or do not get better with medicine.  You have very bad pain in your belly (abdomen).  You cannot drink fluids without throwing up (vomiting).  You get pain in a different part of the area between your belly and thighs (pelvis).  You have bad-smelling discharge from your vagina.  You have a rash. Get help right away if:  You are bleeding a lot from your vagina. A lot of bleeding means soaking more than one sanitary pad in an hour, for 2 hours in a row.  You have clumps of blood (blood clots) coming from your vagina.  You have a fever or chills.  Your belly feels very tender or hard.  You have chest pain.  You have trouble breathing.  You cough up blood.  You feel dizzy.  You feel light-headed.  You pass out (faint).  You have pain in your neck or shoulder area. Summary  Take short walks often, followed by rest periods. Ask your doctor what activities are safe for you. After one or two days, you may be able to return to your normal activities.  Do not lift anything that is heavier than 10 lb (4.5 kg) until your doctor approves.  Do not take baths, swim, or use a hot tub until your doctor approves. Take showers instead of baths.  Contact your doctor if you  have any symptoms of infection, like bad-smelling discharge from your vagina. This information is not intended to replace advice given to you by your health care provider. Make sure you discuss any  questions you have with your health care provider. Document Released: 03/30/2008 Document Revised: 03/08/2016 Document Reviewed: 03/08/2016 Elsevier Interactive Patient Education  2017 [place].  Endometrial Ablation Endometrial ablation is a procedure that destroys the thin inner layer of the lining of the uterus (endometrium). This procedure may be done:  To stop heavy periods.  To stop bleeding that is causing anemia.  To control irregular bleeding.  To treat bleeding caused by small tumors (fibroids) in the endometrium.  This procedure is often an alternative to major surgery, such as removal of the uterus and cervix (hysterectomy). As a result of this procedure:  You may not be able to have children. However, if you are premenopausal (you have not gone through menopause): ? You may still have a small chance of getting pregnant. ? You will need to use a reliable method of birth control after the procedure to prevent pregnancy.  You may stop having a menstrual period, or you may have only a small amount of bleeding during your period. Menstruation may return several years after the procedure.  Tell a health care provider about:  Any allergies you have.  All medicines you are taking, including vitamins, herbs, eye drops, creams, and over-the-counter medicines.  Any problems you or family members have had with the use of anesthetic medicines.  Any blood disorders you have.  Any surgeries you have had.  Any medical conditions you have. What are the risks? Generally, this is a safe procedure. However, problems may occur, including:  A hole (perforation) in the uterus or bowel.  Infection of the uterus, bladder, or vagina.  Bleeding.  Damage to other structures or organs.  An air bubble in the lung (air embolus).  Problems with pregnancy after the procedure.  Failure of the procedure.  Decreased ability to diagnose cancer in the endometrium.  What happens  before the procedure?  You will have tests of your endometrium to make sure there are no pre-cancerous cells or cancer cells present.  You may have an ultrasound of the uterus.  You may be given medicines to thin the endometrium.  Ask your health care provider about: ? Changing or stopping your regular medicines. This is especially important if you take diabetes medicines or blood thinners. ? Taking medicines such as aspirin and ibuprofen. These medicines can thin your blood. Do not take these medicines before your procedure if your doctor tells you not to.  Plan to have someone take you home from the hospital or clinic. What happens during the procedure?  You will lie on an exam table with your feet and legs supported as in a pelvic exam.  To lower your risk of infection: ? Your health care team will wash or sanitize their hands and put on germ-free (sterile) gloves. ? Your genital area will be washed with soap.  An IV tube will be inserted into one of your veins.  You will be given a medicine to help you relax (sedative).  A surgical instrument with a light and camera (resectoscope) will be inserted into your vagina and moved into your uterus. This allows your surgeon to see inside your uterus.  Endometrial tissue will be removed using one of the following methods: ? Radiofrequency. This method uses a radiofrequency-alternating electric current to remove the endometrium. ? Cryotherapy.  This method uses extreme cold to freeze the endometrium. ? Heated-free liquid. This method uses a heated saltwater (saline) solution to remove the endometrium. ? Microwave. This method uses high-energy microwaves to heat up the endometrium and remove it. ? Thermal balloon. This method involves inserting a catheter with a balloon tip into the uterus. The balloon tip is filled with heated fluid to remove the endometrium. The procedure may vary among health care providers and hospitals. What happens  after the procedure?  Your blood pressure, heart rate, breathing rate, and blood oxygen level will be monitored until the medicines you were given have worn off.  As tissue healing occurs, you may notice vaginal bleeding for 4-6 weeks after the procedure. You may also experience: ? Cramps. ? Thin, watery vaginal discharge that is light pink or brown in color. ? A need to urinate more frequently than usual. ? Nausea.  Do not drive for 24 hours if you were given a sedative.  Do not have sex or insert anything into your vagina until your health care provider approves. Summary  Endometrial ablation is done to treat the many causes of heavy menstrual bleeding.  The procedure may be done only after medications have been tried to control the bleeding.  Plan to have someone take you home from the hospital or clinic. This information is not intended to replace advice given to you by your health care provider. Make sure you discuss any questions you have with your health care provider. Document Released: 04/30/2004 Document Revised: 07/08/2016 Document Reviewed: 07/08/2016 Elsevier Interactive Patient Education  2017 [place] Anesthesia, Adult General anesthesia is the use of medicines to make a person "go to sleep" (be unconscious) for a medical procedure. General anesthesia is often recommended when a procedure:  Is long.  Requires you to be still or in an unusual position.  Is major and can cause you to lose blood.  Is impossible to do without general anesthesia.  The medicines used for general anesthesia are called general anesthetics. In addition to making you sleep, the medicines:  Prevent pain.  Control your blood pressure.  Relax your muscles.  Tell a health care provider about:  Any allergies you have.  All medicines you are taking, including vitamins, herbs, eye drops, creams, and over-the-counter medicines.  Any problems you or family members have  had with anesthetic medicines.  Types of anesthetics you have had in the past.  Any bleeding disorders you have.  Any surgeries you have had.  Any medical conditions you have.  Any history of heart or lung conditions, such as heart failure, sleep apnea, or chronic obstructive pulmonary disease (COPD).  Whether you are pregnant or may be pregnant.  Whether you use tobacco, alcohol, marijuana, or street drugs.  Any history of Armed forces logistics/support/administrative officer.  Any history of depression or anxiety. What are the risks? Generally, this is a safe procedure. However, problems may occur, including:  Allergic reaction to anesthetics.  Lung and heart problems.  Inhaling food or liquids from your stomach into your lungs (aspiration).  Injury to nerves.  Waking up during your procedure and being unable to move (rare).  Extreme agitation or a state of mental confusion (delirium) when you wake up from the anesthetic.  Air in the bloodstream, which can lead to stroke.  These problems are more likely to develop if you are having a major surgery or if you have an advanced medical condition. You can prevent some of these complications by  answering all of your health care provider's questions thoroughly and by following all pre-procedure instructions. General anesthesia can cause side effects, including:  Nausea or vomiting  A sore throat from the breathing tube.  Feeling cold or shivery.  Feeling tired, washed out, or achy.  Sleepiness or drowsiness.  Confusion or agitation.  What happens before the procedure? Staying hydrated Follow instructions from your health care provider about hydration, which may include:  Up to 2 hours before the procedure - you may continue to drink clear liquids, such as water, clear fruit juice, black coffee, and plain tea.  Eating and drinking restrictions Follow instructions from your health care provider about eating and drinking, which may include:  8 hours  before the procedure - stop eating heavy meals or foods such as meat, fried foods, or fatty foods.  6 hours before the procedure - stop eating light meals or foods, such as toast or cereal.  6 hours before the procedure - stop drinking milk or drinks that contain milk.  2 hours before the procedure - stop drinking clear liquids.  Medicines  Ask your health care provider about: ? Changing or stopping your regular medicines. This is especially important if you are taking diabetes medicines or blood thinners. ? Taking medicines such as aspirin and ibuprofen. These medicines can thin your blood. Do not take these medicines before your procedure if your health care provider instructs you not to. ? Taking new dietary supplements or medicines. Do not take these during the week before your procedure unless your health care provider approves them.  If you are told to take a medicine or to continue taking a medicine on the day of the procedure, take the medicine with sips of water. General instructions   Ask if you will be going home the same day, the following day, or after a longer hospital stay. ? Plan to have someone take you home. ? Plan to have someone stay with you for the first 24 hours after you leave the hospital or clinic.  For 3-6 weeks before the procedure, try not to use any tobacco products, such as cigarettes, chewing tobacco, and e-cigarettes.  You may brush your teeth on the morning of the procedure, but make sure to spit out the toothpaste. What happens during the procedure?  You will be given anesthetics through a mask and through an IV tube in one of your veins.  You may receive medicine to help you relax (sedative).  As soon as you are asleep, a breathing tube may be used to help you breathe.  An anesthesia specialist will stay with you throughout the procedure. He or she will help keep you comfortable and safe by continuing to give you medicines and adjusting the amount  of medicine that you get. He or she will also watch your blood pressure, pulse, and oxygen levels to make sure that the anesthetics do not cause any problems.  If a breathing tube was used to help you breathe, it will be removed before you wake up. The procedure may vary among health care providers and hospitals. What happens after the procedure?  You will wake up, often slowly, after the procedure is complete, usually in a recovery area.  Your blood pressure, heart rate, breathing rate, and blood oxygen level will be monitored until the medicines you were given have worn off.  You may be given medicine to help you calm down if you feel anxious or agitated.  If you will be going  home the same day, your health care provider may check to make sure you can stand, drink, and urinate.  Your health care providers will treat your pain and side effects before you go home.  Do not drive for 24 hours if you received a sedative.  You may: ? Feel nauseous and vomit. ? Have a sore throat. ? Have mental slowness. ? Feel cold or shivery. ? Feel sleepy. ? Feel tired. ? Feel sore or achy, even in parts of your body where you did not have surgery. This information is not intended to replace advice given to you by your health care provider. Make sure you discuss any questions you have with your health care provider. Document Released: 09/28/2007 Document Revised: 12/02/2015 Document Reviewed: 06/05/2015 Elsevier Interactive Patient Education  2018 [place] Anesthesia, Adult, Care After These instructions provide you with information about caring for yourself after your procedure. Your health care provider may also give you more specific instructions. Your treatment has been planned according to current medical practices, but problems sometimes occur. Call your health care provider if you have any problems or questions after your procedure. What can I expect after the procedure? After the  procedure, it is common to have:  Vomiting.  A sore throat.  Mental slowness.  It is common to feel:  Nauseous.  Cold or shivery.  Sleepy.  Tired.  Sore or achy, even in parts of your body where you did not have surgery.  Follow these instructions at home: For at least 24 hours after the procedure:  Do not: ? Participate in activities where you could fall or become injured. ? Drive. ? Use heavy machinery. ? Drink alcohol. ? Take sleeping pills or medicines that cause drowsiness. ? Make important decisions or sign legal documents. ? Take care of children on your own.  Rest. Eating and drinking  If you vomit, drink water, juice, or soup when you can drink without vomiting.  Drink enough fluid to keep your urine clear or pale yellow.  Make sure you have little or no nausea before eating solid foods.  Follow the diet recommended by your health care provider. General instructions  Have a responsible adult stay with you until you are awake and alert.  Return to your normal activities as told by your health care provider. Ask your health care provider what activities are safe for you.  Take over-the-counter and prescription medicines only as told by your health care provider.  If you smoke, do not smoke without supervision.  Keep all follow-up visits as told by your health care provider. This is important. Contact a health care provider if:  You continue to have nausea or vomiting at home, and medicines are not helpful.  You cannot drink fluids or start eating again.  You cannot urinate after 8-12 hours.  You develop a skin rash.  You have fever.  You have increasing redness at the site of your procedure. Get help right away if:  You have difficulty breathing.  You have chest pain.  You have unexpected bleeding.  You feel that you are having a life-threatening or urgent problem. This information is not intended to replace advice given to you by your  health care provider. Make sure you discuss any questions you have with your health care provider. Document Released: 09/27/2000 Document Revised: 11/24/2015 Document Reviewed: 06/05/2015 Elsevier Interactive Patient Education  [employer].

## 2017-09-06 ENCOUNTER — Other Ambulatory Visit: Payer: Self-pay | Admitting: Family Medicine

## 2017-09-07 ENCOUNTER — Encounter (HOSPITAL_COMMUNITY)
Admission: RE | Admit: 2017-09-07 | Discharge: 2017-09-07 | Disposition: A | Discharge status: Home / Self Care | Payer: BC Managed Care – PPO | Source: Ambulatory Visit | Attending: Obstetrics & Gynecology | Admitting: Obstetrics & Gynecology

## 2017-09-07 ENCOUNTER — Other Ambulatory Visit: Payer: Self-pay

## 2017-09-07 ENCOUNTER — Ambulatory Visit: Payer: BC Managed Care – PPO | Admitting: Orthopedic Surgery

## 2017-09-07 ENCOUNTER — Other Ambulatory Visit: Payer: Self-pay | Admitting: Obstetrics & Gynecology

## 2017-09-07 ENCOUNTER — Encounter (HOSPITAL_COMMUNITY): Payer: Self-pay

## 2017-09-07 VITALS — BP 143/91 | HR 77 | Ht 68.0 in | Wt 256.0 lb

## 2017-09-07 DIAGNOSIS — R1031 Right lower quadrant pain: Secondary | ICD-10-CM | POA: Insufficient documentation

## 2017-09-07 DIAGNOSIS — N840 Polyp of corpus uteri: Secondary | ICD-10-CM | POA: Insufficient documentation

## 2017-09-07 DIAGNOSIS — Z01818 Encounter for other preprocedural examination: Secondary | ICD-10-CM | POA: Insufficient documentation

## 2017-09-07 DIAGNOSIS — D251 Intramural leiomyoma of uterus: Secondary | ICD-10-CM | POA: Insufficient documentation

## 2017-09-07 DIAGNOSIS — M65341 Trigger finger, right ring finger: Secondary | ICD-10-CM | POA: Diagnosis not present

## 2017-09-07 DIAGNOSIS — Z01810 Encounter for preprocedural cardiovascular examination: Secondary | ICD-10-CM | POA: Insufficient documentation

## 2017-09-07 HISTORY — DX: Polyneuropathy, unspecified: G62.9

## 2017-09-07 HISTORY — DX: Gastro-esophageal reflux disease without esophagitis: K21.9

## 2017-09-07 HISTORY — DX: Sleep apnea, unspecified: G47.30

## 2017-09-07 HISTORY — DX: Type 2 diabetes mellitus without complications: E11.9

## 2017-09-07 HISTORY — DX: Unspecified convulsions: R56.9

## 2017-09-07 LAB — URINALYSIS, ROUTINE W REFLEX MICROSCOPIC
Bilirubin Urine: NEGATIVE
Glucose, UA: NEGATIVE mg/dL
KETONES UR: NEGATIVE mg/dL
Nitrite: POSITIVE — AB
Protein, ur: NEGATIVE mg/dL
Specific Gravity, Urine: 1.023 (ref 1.005–1.030)
pH: 5 (ref 5.0–8.0)

## 2017-09-07 LAB — COMPREHENSIVE METABOLIC PANEL
ALT: 14 U/L (ref 14–54)
AST: 18 U/L (ref 15–41)
Albumin: 3.7 g/dL (ref 3.5–5.0)
Alkaline Phosphatase: 97 U/L (ref 38–126)
Anion gap: 11 (ref 5–15)
BUN: 12 mg/dL (ref 6–20)
CHLORIDE: 102 mmol/L (ref 101–111)
CO2: 23 mmol/L (ref 22–32)
CREATININE: 0.81 mg/dL (ref 0.44–1.00)
Calcium: 9.1 mg/dL (ref 8.9–10.3)
Glucose, Bld: 124 mg/dL — ABNORMAL HIGH (ref 65–99)
POTASSIUM: 3.6 mmol/L (ref 3.5–5.1)
Sodium: 136 mmol/L (ref 135–145)
Total Bilirubin: 0.7 mg/dL (ref 0.3–1.2)
Total Protein: 7.7 g/dL (ref 6.5–8.1)

## 2017-09-07 LAB — RAPID HIV SCREEN (HIV 1/2 AB+AG)
HIV 1/2 Antibodies: NONREACTIVE
HIV-1 P24 ANTIGEN - HIV24: NONREACTIVE

## 2017-09-07 LAB — CBC
HCT: 40 % (ref 36.0–46.0)
Hemoglobin: 12.7 g/dL (ref 12.0–15.0)
MCH: 28.6 pg (ref 26.0–34.0)
MCHC: 31.8 g/dL (ref 30.0–36.0)
MCV: 90.1 fL (ref 78.0–100.0)
PLATELETS: 340 10*3/uL (ref 150–400)
RBC: 4.44 MIL/uL (ref 3.87–5.11)
RDW: 13.9 % (ref 11.5–15.5)
WBC: 8.1 10*3/uL (ref 4.0–10.5)

## 2017-09-07 LAB — HEMOGLOBIN A1C
HEMOGLOBIN A1C: 5.7 % — AB (ref 4.8–5.6)
MEAN PLASMA GLUCOSE: 116.89 mg/dL

## 2017-09-07 LAB — GLUCOSE, CAPILLARY: GLUCOSE-CAPILLARY: 130 mg/dL — AB (ref 65–99)

## 2017-09-07 LAB — HCG, QUANTITATIVE, PREGNANCY: HCG, BETA CHAIN, QUANT, S: 1 m[IU]/mL (ref <5)

## 2017-09-07 NOTE — Progress Notes (Signed)
Patient ID: Julie Ryan, female   DOB: August 15, 1965, 52 y.o.   MRN: [id number]  Chief Complaint  Patient presents with  . Hand Pain    Right ring trigger finger locking for 6 months.    HPI Julie Ryan is a 52 y.o. female.  Presents for evaluation of right ring finger triggering  Patient complains of sharp pain and tenderness over the A1 pulley right ring finger for 6 months associated with catching locking and triggering no trauma  Review of Systems Review of Systems  Neurological: Negative for numbness.  All other systems reviewed and are negative.   Past Medical History:  Diagnosis Date  . Anxiety   . Carpal tunnel syndrome of right wrist   . Chronic knee pain   . Depression   . Endometrial polyp 08/09/2017   will get appt with Dr Elonda Husky  . Fibroids 08/09/2017   Has multiple small fibroids   . Hypertension   . Obesity   . Prediabetes     Past Surgical History:  Procedure Laterality Date  . APPENDECTOMY    . CARPAL TUNNEL RELEASE Right 08/01/2015   Procedure: RIGHT CARPAL TUNNEL RELEASE;  Surgeon: Carole Civil, MD;  Location: [facility];  Service: Orthopedics;  Laterality: Right;  . COLONOSCOPY N/A 06/15/2017   Procedure: COLONOSCOPY;  Surgeon: Rogene Houston, MD;  Location: [facility];  Service: Endoscopy;  Laterality: N/A;  830  . ESOPHAGOGASTRODUODENOSCOPY N/A 05/25/2013   Procedure: ESOPHAGOGASTRODUODENOSCOPY (EGD);  Surgeon: Rogene Houston, MD;  Location: [facility];  Service: Endoscopy;  Laterality: N/A;  220  . TUBAL LIGATION      Family History  Problem Relation Age of Onset  . Hypertension Mother   . Heart disease Mother   . Diabetes Mother   . Diabetes Sister   . Multiple sclerosis Sister   . Lupus Sister   . Heart disease Sister   . CVA Sister   . Diabetes Brother   . Hypertension Brother   . Other Son        brain tumor  . Hypertension Daughter   . Diabetes Sister     Social History Social History   Tobacco Use  . Smoking status:  Former Smoker    Packs/day: 0.25    Types: Cigarettes    Last attempt to quit: 03/04/2017    Years since quitting: 0.5  . Smokeless tobacco: Never Used  Substance Use Topics  . Alcohol use: No    Alcohol/week: 0.0 oz  . Drug use: No    Allergies  Allergen Reactions  . Codeine Nausea Only and Other (See Comments)    Dizziness   . Effexor Xr [Venlafaxine Hcl Er] Other (See Comments)    Makes patient feel on edge   . Fluoxetine Other (See Comments)    States that med caused memory problems and crying spells   . Hydrocodone Nausea Only and Other (See Comments)    Dizziness   . Latex Itching and Rash    Current Outpatient Medications  Medication Sig Dispense Refill  . Black Currant Seed Oil 500 MG CAPS Take 500 mg by mouth 2 (two) times daily.     Marland Kitchen conjugated estrogens (PREMARIN) vaginal cream Use .5 gm in vagina 2-3 x weekly (Patient taking differently: Place 0.5 g vaginally 2 (two) times a week. ) 30 g 4  . furosemide (LASIX) 20 MG tablet Onen tablet once daily as needed (Patient taking differently: Take 20 mg by mouth daily  as needed for edema. ) 10 tablet 0  . gabapentin (NEURONTIN) 300 MG capsule TAKE 1 CAPSULE BY MOUTH AT BEDTIME (Patient taking differently: TAKE 300 MG BY MOUTH AT BEDTIME) 90 capsule 1  . ibuprofen (ADVIL,MOTRIN) 800 MG tablet TAKE 1 TABLET BY MOUTH EVERY 8 HOURS AS NEEDED 21 tablet 0  . loratadine (CLARITIN) 10 MG tablet TAKE ONE TABLET BY MOUTH ONCE DAILY (Patient taking differently: TAKE 10 MG BY MOUTH ONCE DAILY) 30 tablet 11  . lovastatin (MEVACOR) 20 MG tablet TAKE 2 TABLETS BY MOUTH ONCE DAILY AT BEDTIME 180 tablet 1  . meloxicam (MOBIC) 15 MG tablet Take 1 tablet (15 mg total) by mouth daily. (Patient not taking: Reported on 09/02/2017) 30 tablet 5  . metFORMIN (GLUCOPHAGE-XR) 500 MG 24 hr tablet TAKE ONE TABLET BY MOUTH ONCE DAILY WITH BREAKFAST (Patient taking differently: TAKE 500 MG BY MOUTH ONCE DAILY WITH BREAKFAST) 90 tablet 1  . metoprolol tartrate  (LOPRESSOR) 25 MG tablet TAKE 1 TABLET BY MOUTH TWICE DAILY FOR BLOOD PRESSURE 180 tablet 1  . phentermine (ADIPEX-P) 37.5 MG tablet Take 1 tablet (37.5 mg total) by mouth daily before breakfast. (Patient taking differently: Take 18.75 mg by mouth daily before breakfast. ) 30 tablet 1  . potassium chloride SA (K-DUR,KLOR-CON) 20 MEQ tablet One tablet once daily as needed on days when you take the lasix (Patient taking differently: Take 20 mEq by mouth daily as needed (when taking Lasix). ) 10 tablet 0  . predniSONE (STERAPRED UNI-PAK 21 TAB) 5 MG (21) TBPK tablet TAKE BY MOUTH AS DIRECTED PER PACKAGE INSTRUCTIONS 21 tablet 0  . ranitidine (ZANTAC) 150 MG tablet TAKE 1 TABLET BY MOUTH TWICE DAILY (Patient taking differently: TAKE 150 MG BY MOUTH TWICE DAILY) 180 tablet 1  . vitamin B-12 (CYANOCOBALAMIN) 500 MCG tablet Take 500 mcg by mouth daily.     No current facility-administered medications for this visit.      Physical Exam Physical Exam Blood pressure (!) 143/91, pulse 77, height 5\' 8"  (1.727 m), weight 256 lb (116.1 kg), last menstrual period 08/19/2017. Appearance, there are no abnormalities in terms of appearance the patient was well-developed and well-nourished. The grooming and hygiene were normal.  Mental status orientation, there was normal alertness and orientation Mood pleasant Ambulatory status normal with no assistive devices  Examination of the right hand reveals tenderness over the A1 pulley of the right ring  finger Range of motion remains normal with clicking on flexion extension.  Stability tests show no abnormality of the IP joints are MP joint. The FDP and FDS strength is normal Skin warm dry and intact without laceration or ulceration or erythema Neurologic examination normal sensation Vascular examination normal pulses with warm extremity and normal capillary refill  The opposite extremity left hand ring finger no tenderness catching locking normal flexor tendon  strength alignment normal    MEDICAL DECISION SECTION  xrays ordered?  No  My independent reading of xrays: Not applicable   Encounter Diagnosis  Name Primary?  . Trigger finger, right ring finger Yes     PLAN:   No orders of the defined types were placed in this encounter.  Injection? Yes Trigger finger injection  Diagnosis   Encounter Diagnosis  Name Primary?  . Trigger finger, right ring finger Yes    Procedure injection A1 pulley Medications lidocaine 1% 1 mL and Depo-Medrol 40 mg 1 mL Skin prep alcohol and ethyl chloride Verbal consent was obtained Timeout confirmed the injection site  After cleaning the skin with alcohol and anesthetizing the skin with ethyl chloride the A1 pulley was palpated and the injection was performed without complication MRI/CT/? no

## 2017-09-07 NOTE — Patient Instructions (Addendum)
Trigger Finger Trigger finger (stenosing tenosynovitis) is a condition that causes a finger to get stuck in a bent position. Each finger has a tough, cord-like tissue that connects muscle to bone (tendon), and each tendon is surrounded by a tunnel of tissue (tendon sheath). To move your finger, your tendon needs to slide freely through the sheath. Trigger finger happens when the tendon or the sheath thickens, making it difficult to move your finger. Trigger finger can affect any finger or a thumb. It may affect more than one finger. Mild cases may clear up with rest and medicine. Severe cases require more treatment. What are the causes? Trigger finger is caused by a thickened finger tendon or tendon sheath. The cause of this thickening is not known. What increases the risk? The following factors may make you more likely to develop this condition:  Doing activities that require a strong grip.  Having rheumatoid arthritis, gout, or diabetes.  Being 40-60 years old.  Being a woman.  What are the signs or symptoms? Symptoms of this condition include:  Pain when bending or straightening your finger.  Tenderness or swelling where your finger attaches to the palm of your hand.  A lump in the palm of your hand or on the inside of your finger.  Hearing a popping sound when you try to straighten your finger.  Feeling a popping, catching, or locking sensation when you try to straighten your finger.  Being unable to straighten your finger.  How is this diagnosed? This condition is diagnosed based on your symptoms and a physical exam. How is this treated? This condition may be treated by:  Resting your finger and avoiding activities that make symptoms worse.  Wearing a finger splint to keep your finger in a slightly bent position.  Taking NSAIDs to relieve pain and swelling.  Injecting medicine (steroids) into the tendon sheath to reduce swelling and irritation. Injections may need to be  repeated.  Having surgery to open the tendon sheath. This may be done if other treatments do not work and you cannot straighten your finger. You may need physical therapy after surgery.  Follow these instructions at home:  Use moist heat to help reduce pain and swelling as told by your health care provider.  Rest your finger and avoid activities that make pain worse. Return to normal activities as told by your health care provider.  If you have a splint, wear it as told by your health care provider.  Take over-the-counter and prescription medicines only as told by your health care provider.  Keep all follow-up visits as told by your health care provider. This is important. Contact a health care provider if:  Your symptoms are not improving with home care. Summary  Trigger finger (stenosing tenosynovitis) causes your finger to get stuck in a bent position, and it can make it difficult and painful to straighten your finger.  This condition develops when a finger tendon or tendon sheath thickens.  Treatment starts with resting, wearing a splint, and taking NSAIDs.  In severe cases, surgery to open the tendon sheath may be needed. This information is not intended to replace advice given to you by your health care provider. Make sure you discuss any questions you have with your health care provider. Document Released: 04/10/2004 Document Revised: 06/01/2016 Document Reviewed: 06/01/2016 Elsevier Interactive Patient Education  2017 [employer]  

## 2017-09-08 NOTE — Pre-Procedure Instructions (Signed)
hgbA1C routed to PCP.

## 2017-09-14 ENCOUNTER — Encounter (HOSPITAL_COMMUNITY): Payer: Self-pay | Admitting: *Deleted

## 2017-09-14 ENCOUNTER — Ambulatory Visit (HOSPITAL_COMMUNITY)
Admission: RE | Admit: 2017-09-14 | Discharge: 2017-09-14 | Disposition: A | Discharge status: Home / Self Care | Payer: BC Managed Care – PPO | Source: Ambulatory Visit | Attending: Obstetrics & Gynecology | Admitting: Obstetrics & Gynecology

## 2017-09-14 ENCOUNTER — Encounter (HOSPITAL_COMMUNITY)
Admission: RE | Disposition: A | Discharge status: Home / Self Care | Payer: Self-pay | Source: Ambulatory Visit | Attending: Obstetrics & Gynecology

## 2017-09-14 ENCOUNTER — Ambulatory Visit (HOSPITAL_COMMUNITY): Payer: BC Managed Care – PPO | Admitting: Anesthesiology

## 2017-09-14 DIAGNOSIS — Z6838 Body mass index (BMI) 38.0-38.9, adult: Secondary | ICD-10-CM | POA: Diagnosis not present

## 2017-09-14 DIAGNOSIS — N921 Excessive and frequent menstruation with irregular cycle: Secondary | ICD-10-CM | POA: Insufficient documentation

## 2017-09-14 DIAGNOSIS — Z885 Allergy status to narcotic agent status: Secondary | ICD-10-CM | POA: Insufficient documentation

## 2017-09-14 DIAGNOSIS — Z833 Family history of diabetes mellitus: Secondary | ICD-10-CM | POA: Insufficient documentation

## 2017-09-14 DIAGNOSIS — K219 Gastro-esophageal reflux disease without esophagitis: Secondary | ICD-10-CM | POA: Diagnosis not present

## 2017-09-14 DIAGNOSIS — E559 Vitamin D deficiency, unspecified: Secondary | ICD-10-CM | POA: Insufficient documentation

## 2017-09-14 DIAGNOSIS — K2210 Ulcer of esophagus without bleeding: Secondary | ICD-10-CM | POA: Insufficient documentation

## 2017-09-14 DIAGNOSIS — Z8249 Family history of ischemic heart disease and other diseases of the circulatory system: Secondary | ICD-10-CM | POA: Insufficient documentation

## 2017-09-14 DIAGNOSIS — G4730 Sleep apnea, unspecified: Secondary | ICD-10-CM | POA: Insufficient documentation

## 2017-09-14 DIAGNOSIS — F329 Major depressive disorder, single episode, unspecified: Secondary | ICD-10-CM | POA: Diagnosis not present

## 2017-09-14 DIAGNOSIS — N840 Polyp of corpus uteri: Secondary | ICD-10-CM | POA: Insufficient documentation

## 2017-09-14 DIAGNOSIS — Z91040 Latex allergy status: Secondary | ICD-10-CM | POA: Insufficient documentation

## 2017-09-14 DIAGNOSIS — I10 Essential (primary) hypertension: Secondary | ICD-10-CM | POA: Diagnosis not present

## 2017-09-14 DIAGNOSIS — Z8489 Family history of other specified conditions: Secondary | ICD-10-CM | POA: Insufficient documentation

## 2017-09-14 DIAGNOSIS — Z823 Family history of stroke: Secondary | ICD-10-CM | POA: Diagnosis not present

## 2017-09-14 DIAGNOSIS — F411 Generalized anxiety disorder: Secondary | ICD-10-CM | POA: Insufficient documentation

## 2017-09-14 DIAGNOSIS — N946 Dysmenorrhea, unspecified: Secondary | ICD-10-CM | POA: Diagnosis not present

## 2017-09-14 DIAGNOSIS — Z888 Allergy status to other drugs, medicaments and biological substances status: Secondary | ICD-10-CM | POA: Insufficient documentation

## 2017-09-14 DIAGNOSIS — E1140 Type 2 diabetes mellitus with diabetic neuropathy, unspecified: Secondary | ICD-10-CM | POA: Diagnosis not present

## 2017-09-14 DIAGNOSIS — Z87891 Personal history of nicotine dependence: Secondary | ICD-10-CM | POA: Diagnosis not present

## 2017-09-14 DIAGNOSIS — Z808 Family history of malignant neoplasm of other organs or systems: Secondary | ICD-10-CM | POA: Diagnosis not present

## 2017-09-14 DIAGNOSIS — G8929 Other chronic pain: Secondary | ICD-10-CM | POA: Diagnosis not present

## 2017-09-14 DIAGNOSIS — E785 Hyperlipidemia, unspecified: Secondary | ICD-10-CM | POA: Diagnosis not present

## 2017-09-14 DIAGNOSIS — Z820 Family history of epilepsy and other diseases of the nervous system: Secondary | ICD-10-CM | POA: Diagnosis not present

## 2017-09-14 HISTORY — PX: CERVICAL POLYPECTOMY: SHX88

## 2017-09-14 HISTORY — PX: HYSTEROSCOPY WITH D & C: SHX1775

## 2017-09-14 HISTORY — PX: HYSTEROSCOPY W/D&C: SHX1775

## 2017-09-14 HISTORY — PX: ENDOMETRIAL ABLATION: SHX621

## 2017-09-14 LAB — GLUCOSE, CAPILLARY
Glucose-Capillary: 107 mg/dL — ABNORMAL HIGH (ref 65–99)
Glucose-Capillary: 112 mg/dL — ABNORMAL HIGH (ref 65–99)

## 2017-09-14 SURGERY — DILATATION AND CURETTAGE /HYSTEROSCOPY
Anesthesia: General

## 2017-09-14 MED ORDER — OXYCODONE-ACETAMINOPHEN 2.5-325 MG PO TABS: 1.0000 | ORAL_TABLET | ORAL | 0 refills | Status: DC | PRN

## 2017-09-14 MED ORDER — KETOROLAC TROMETHAMINE 30 MG/ML IJ SOLN
30.0000 mg | Freq: Once | INTRAMUSCULAR | Status: AC
  Administered 2017-09-14: 30 mg via INTRAVENOUS
  Filled 2017-09-14: qty 1

## 2017-09-14 MED ORDER — MIDAZOLAM HCL 2 MG/2ML IJ SOLN
INTRAMUSCULAR | Status: AC
  Filled 2017-09-14: qty 2

## 2017-09-14 MED ORDER — ONDANSETRON HCL 4 MG/2ML IJ SOLN
4.0000 mg | Freq: Once | INTRAMUSCULAR | Status: AC
  Administered 2017-09-14: 4 mg via INTRAVENOUS

## 2017-09-14 MED ORDER — FENTANYL CITRATE (PF) 100 MCG/2ML IJ SOLN
25.0000 ug | INTRAMUSCULAR | Status: DC | PRN
  Administered 2017-09-14: 50 ug via INTRAVENOUS
  Filled 2017-09-14: qty 2

## 2017-09-14 MED ORDER — LIDOCAINE HCL (PF) 1 % IJ SOLN
INTRAMUSCULAR | Status: AC
  Filled 2017-09-14: qty 5

## 2017-09-14 MED ORDER — PROPOFOL 10 MG/ML IV BOLUS
INTRAVENOUS | Status: AC
Start: 2017-09-14 — End: ?
  Filled 2017-09-14: qty 20

## 2017-09-14 MED ORDER — LIDOCAINE HCL 1 % IJ SOLN
INTRAMUSCULAR | Status: DC | PRN
  Administered 2017-09-14: 35 mg via INTRADERMAL

## 2017-09-14 MED ORDER — SODIUM CHLORIDE 0.9 % IR SOLN
Status: DC | PRN
  Administered 2017-09-14: 3000 mL

## 2017-09-14 MED ORDER — PROPOFOL 10 MG/ML IV BOLUS
INTRAVENOUS | Status: DC | PRN
  Administered 2017-09-14: 170 mg via INTRAVENOUS
  Administered 2017-09-14: 30 mg via INTRAVENOUS

## 2017-09-14 MED ORDER — CEFAZOLIN SODIUM-DEXTROSE 2-4 GM/100ML-% IV SOLN
2.0000 g | INTRAVENOUS | Status: AC
  Administered 2017-09-14: 2 g via INTRAVENOUS
  Filled 2017-09-14: qty 100

## 2017-09-14 MED ORDER — KETOROLAC TROMETHAMINE 10 MG PO TABS: 10.0000 mg | ORAL_TABLET | Freq: Three times a day (TID) | ORAL | 0 refills | Status: DC | PRN

## 2017-09-14 MED ORDER — FENTANYL CITRATE (PF) 100 MCG/2ML IJ SOLN
INTRAMUSCULAR | Status: DC | PRN
  Administered 2017-09-14: 50 ug via INTRAVENOUS

## 2017-09-14 MED ORDER — FENTANYL CITRATE (PF) 100 MCG/2ML IJ SOLN
INTRAMUSCULAR | Status: AC
  Filled 2017-09-14: qty 2

## 2017-09-14 MED ORDER — DEXAMETHASONE SODIUM PHOSPHATE 4 MG/ML IJ SOLN
4.0000 mg | Freq: Once | INTRAMUSCULAR | Status: AC
  Administered 2017-09-14: 4 mg via INTRAVENOUS

## 2017-09-14 MED ORDER — PROPOFOL 10 MG/ML IV BOLUS
INTRAVENOUS | Status: AC
  Filled 2017-09-14: qty 20

## 2017-09-14 MED ORDER — MIDAZOLAM HCL 5 MG/5ML IJ SOLN
INTRAMUSCULAR | Status: DC | PRN
  Administered 2017-09-14: 2 mg via INTRAVENOUS

## 2017-09-14 MED ORDER — 0.9 % SODIUM CHLORIDE (POUR BTL) OPTIME
TOPICAL | Status: DC | PRN
  Administered 2017-09-14: 1000 mL

## 2017-09-14 MED ORDER — LACTATED RINGERS IV SOLN
INTRAVENOUS | Status: DC
  Administered 2017-09-14: 1000 mL via INTRAVENOUS

## 2017-09-14 MED ORDER — MIDAZOLAM HCL 2 MG/2ML IJ SOLN
1.0000 mg | INTRAMUSCULAR | Status: AC
  Administered 2017-09-14: 2 mg via INTRAVENOUS

## 2017-09-14 MED ORDER — DEXAMETHASONE SODIUM PHOSPHATE 4 MG/ML IJ SOLN
INTRAMUSCULAR | Status: AC
  Filled 2017-09-14: qty 1

## 2017-09-14 MED ORDER — ONDANSETRON 8 MG PO TBDP: 8.0000 mg | ORAL_TABLET | Freq: Three times a day (TID) | ORAL | 0 refills | Status: DC | PRN

## 2017-09-14 MED ORDER — ONDANSETRON HCL 4 MG/2ML IJ SOLN
INTRAMUSCULAR | Status: AC
  Filled 2017-09-14: qty 2

## 2017-09-14 SURGICAL SUPPLY — 31 items
BAG HAMPER (MISCELLANEOUS) ×4 IMPLANT
BLADE 15 SAFETY STRL DISP (BLADE) ×4 IMPLANT
CLOTH BEACON ORANGE TIMEOUT ST (SAFETY) ×4 IMPLANT
COVER LIGHT HANDLE STERIS (MISCELLANEOUS) ×8 IMPLANT
GAUZE SPONGE 4X4 12PLY STRL (GAUZE/BANDAGES/DRESSINGS) ×4 IMPLANT
GAUZE SPONGE 4X4 16PLY XRAY LF (GAUZE/BANDAGES/DRESSINGS) ×4 IMPLANT
GLOVE BIOGEL PI IND STRL 6.5 (GLOVE) ×2 IMPLANT
GLOVE BIOGEL PI IND STRL 7.0 (GLOVE) ×2 IMPLANT
GLOVE BIOGEL PI IND STRL 8 (GLOVE) ×2 IMPLANT
GLOVE BIOGEL PI INDICATOR 6.5 (GLOVE) ×2
GLOVE BIOGEL PI INDICATOR 7.0 (GLOVE) ×2
GLOVE BIOGEL PI INDICATOR 8 (GLOVE) ×2
GLOVE SURG SS PI 6.5 STRL IVOR (GLOVE) ×4 IMPLANT
GLOVE SURG SS PI 8.0 STRL IVOR (GLOVE) ×4 IMPLANT
GOWN STRL REUS W/TWL LRG LVL3 (GOWN DISPOSABLE) ×8 IMPLANT
GOWN STRL REUS W/TWL XL LVL3 (GOWN DISPOSABLE) ×4 IMPLANT
HANDPIECE ABLA MINERVA ENDO (MISCELLANEOUS) ×4 IMPLANT
INST SET HYSTEROSCOPY (KITS) ×4 IMPLANT
IV NS IRRIG 3000ML ARTHROMATIC (IV SOLUTION) ×4 IMPLANT
KIT ROOM TURNOVER AP CYSTO (KITS) ×4 IMPLANT
MANIFOLD NEPTUNE II (INSTRUMENTS) ×4 IMPLANT
MARKER SKIN DUAL TIP RULER LAB (MISCELLANEOUS) ×4 IMPLANT
NS IRRIG 1000ML POUR BTL (IV SOLUTION) ×4 IMPLANT
PACK BASIC III (CUSTOM PROCEDURE TRAY) ×2
PACK SRG BSC III STRL LF ECLPS (CUSTOM PROCEDURE TRAY) ×2 IMPLANT
PAD ARMBOARD 7.5X6 YLW CONV (MISCELLANEOUS) ×4 IMPLANT
PAD TELFA 3X4 1S STER (GAUZE/BANDAGES/DRESSINGS) ×4 IMPLANT
SET BASIN LINEN APH (SET/KITS/TRAYS/PACK) ×4 IMPLANT
SET IRRIG Y TYPE TUR BLADDER L (SET/KITS/TRAYS/PACK) ×4 IMPLANT
SHEET LAVH (DRAPES) ×4 IMPLANT
YANKAUER SUCT BULB TIP 10FT TU (MISCELLANEOUS) ×4 IMPLANT

## 2017-09-14 NOTE — Op Note (Signed)
Preoperative diagnosis:  1.   menometrorrhagia                                         2.  Dysmenorrhea                                          3.  Endometrial polyp   Postoperative diagnoses: Same as above   Procedure: Hysteroscopy, removal of endometrial [polyp,  uterine curettage, endometrial ablation using Minerva  Surgeon: Florian Buff   Anesthesia: Laryngeal mask airway  Findings: The endometrium was significant for an ovoid smooth surfaced endometrial polyp, 2 cm.  The endometrium was otherwise normal. There were no other fibroids or  abnormalities.  Description of operation: The patient was taken to the operating room and placed in the supine position. She underwent general anesthesia using the laryngeal mask airway. She was placed in the dorsal lithotomy position and prepped and draped in the usual sterile fashion. A Graves speculum was placed and the anterior cervical lip was grasped with a single-tooth tenaculum. The cervix was dilated serially to allow passage of the hysteroscope. Diagnostic hysteroscopy was performed and was found to be normal, except for the benign appearing polyp. The polyp was removed in total using ring forceps.  A vigorous uterine curettage was then performed and all tissue sent to pathology for evaluation.  I then proceeded to perform the Minerva endometrial ablation.   The uterus sounded to 8.0 cm The handpiece was attached to the Minerva power source/machine and the handpiece passed the checklist. The array was squeezed down to remove all of the air present.  The array was then place into the endometrial cavity and deployed to a length of 5.5 cm. The handpiece confirmed appropriate width by being in the green portion of the visual dial. The cervical cuff was then inflated to the point the CO2 indicator was in the green. The endometrial integrity check was then performed and integrity sequence was confirmed x 2. The heating was then begun and carried out  for a total of 2 minutes(which is standard therapy time). When the plasma cycle was finished,  the cervical cuff was deflated and the array was removed with tissue present on the silicon membrane. There was appropriate post Minerva bleeding and uterine discharge.     All of the equipment worked well throughout the procedure.  The patient was awakened from anesthesia and taken to the recovery room in good stable condition all counts were correct. She received 2 g of Ancef and 30 mg of Toradol preoperatively. She will be discharged from the recovery room and followed up in the office in 1- 2 weeks.   She can expect 4 weeks of post procedure bloody watery discharge  Florian Buff, MD  09/14/2017 9:34 AM

## 2017-09-14 NOTE — Anesthesia Preprocedure Evaluation (Signed)
Anesthesia Evaluation  Patient identified by MRN, date of birth, ID band Patient awake    Reviewed: Allergy & Precautions, NPO status , Patient's Chart, lab work & pertinent test results  Airway Mallampati: II  TM Distance: >3 FB     Dental  (+) Teeth Intact, Dental Advisory Given   Pulmonary sleep apnea , former smoker,    breath sounds clear to auscultation       Cardiovascular hypertension, Pt. on medications  Rhythm:Regular Rate:Normal     Neuro/Psych Seizures -,  PSYCHIATRIC DISORDERS Anxiety Depression  Neuromuscular disease    GI/Hepatic PUD, GERD  ,  Endo/Other  diabetes, Type 2Morbid obesity  Renal/GU      Musculoskeletal   Abdominal   Peds  Hematology   Anesthesia Other Findings   Reproductive/Obstetrics                             Anesthesia Physical Anesthesia Plan  ASA: III  Anesthesia Plan: General   Post-op Pain Management:    Induction: Intravenous  PONV Risk Score and Plan:   Airway Management Planned: LMA  Additional Equipment:   Intra-op Plan:   Post-operative Plan: Extubation in OR  Informed Consent: I have reviewed the patients History and Physical, chart, labs and discussed the procedure including the risks, benefits and alternatives for the proposed anesthesia with the patient or authorized representative who has indicated his/her understanding and acceptance.     Plan Discussed with:   Anesthesia Plan Comments:         Anesthesia Quick Evaluation

## 2017-09-14 NOTE — Discharge Instructions (Signed)
Call MD: persistent nausea & vomiting Call MD: severe uncontrolled pain Call MD: temp >100.4 No driving today Increase activity slowly No lifting restrictions No sex 3 weeks    Endometrial Ablation Endometrial ablation is a procedure that destroys the thin inner layer of the lining of the uterus (endometrium). This procedure may be done:  To stop heavy periods.  To stop bleeding that is causing anemia.  To control irregular bleeding.  To treat bleeding caused by small tumors (fibroids) in the endometrium.  This procedure is often an alternative to major surgery, such as removal of the uterus and cervix (hysterectomy). As a result of this procedure:  You may not be able to have children. However, if you are premenopausal (you have not gone through menopause): ? You may still have a small chance of getting pregnant. ? You will need to use a reliable method of birth control after the procedure to prevent pregnancy.  You may stop having a menstrual period, or you may have only a small amount of bleeding during your period. Menstruation may return several years after the procedure.  Tell a health care provider about:  Any allergies you have.  All medicines you are taking, including vitamins, herbs, eye drops, creams, and over-the-counter medicines.  Any problems you or family members have had with the use of anesthetic medicines.  Any blood disorders you have.  Any surgeries you have had.  Any medical conditions you have. What are the risks? Generally, this is a safe procedure. However, problems may occur, including:  A hole (perforation) in the uterus or bowel.  Infection of the uterus, bladder, or vagina.  Bleeding.  Damage to other structures or organs.  An air bubble in the lung (air embolus).  Problems with pregnancy after the procedure.  Failure of the procedure.  Decreased ability to diagnose cancer in the endometrium.  What happens before the  procedure?  You will have tests of your endometrium to make sure there are no pre-cancerous cells or cancer cells present.  You may have an ultrasound of the uterus.  You may be given medicines to thin the endometrium.  Ask your health care provider about: ? Changing or stopping your regular medicines. This is especially important if you take diabetes medicines or blood thinners. ? Taking medicines such as aspirin and ibuprofen. These medicines can thin your blood. Do not take these medicines before your procedure if your doctor tells you not to.  Plan to have someone take you home from the hospital or clinic. What happens during the procedure?  You will lie on an exam table with your feet and legs supported as in a pelvic exam.  To lower your risk of infection: ? Your health care team will wash or sanitize their hands and put on germ-free (sterile) gloves. ? Your genital area will be washed with soap.  An IV tube will be inserted into one of your veins.  You will be given a medicine to help you relax (sedative).  A surgical instrument with a light and camera (resectoscope) will be inserted into your vagina and moved into your uterus. This allows your surgeon to see inside your uterus.  Endometrial tissue will be removed using one of the following methods: ? Radiofrequency. This method uses a radiofrequency-alternating electric current to remove the endometrium. ? Cryotherapy. This method uses extreme cold to freeze the endometrium. ? Heated-free liquid. This method uses a heated saltwater (saline) solution to remove the endometrium. ? Microwave. This method  uses high-energy microwaves to heat up the endometrium and remove it. ? Thermal balloon. This method involves inserting a catheter with a balloon tip into the uterus. The balloon tip is filled with heated fluid to remove the endometrium. The procedure may vary among health care providers and hospitals. What happens after the  procedure?  Your blood pressure, heart rate, breathing rate, and blood oxygen level will be monitored until the medicines you were given have worn off.  As tissue healing occurs, you may notice vaginal bleeding for 4-6 weeks after the procedure. You may also experience: ? Cramps. ? Thin, watery vaginal discharge that is light pink or brown in color. ? A need to urinate more frequently than usual. ? Nausea.  Do not drive for 24 hours if you were given a sedative.  Do not have sex or insert anything into your vagina until your health care provider approves. Summary  Endometrial ablation is done to treat the many causes of heavy menstrual bleeding.  The procedure may be done only after medications have been tried to control the bleeding.  Plan to have someone take you home from the hospital or clinic. This information is not intended to replace advice given to you by your health care provider. Make sure you discuss any questions you have with your health care provider. Document Released: 04/30/2004 Document Revised: 07/08/2016 Document Reviewed: 07/08/2016 Elsevier Interactive Patient Education  2017 [place].  Moderate Conscious Sedation, Adult, Care After These instructions provide you with information about caring for yourself after your procedure. Your health care provider may also give you more specific instructions. Your treatment has been planned according to current medical practices, but problems sometimes occur. Call your health care provider if you have any problems or questions after your procedure. What can I expect after the procedure? After your procedure, it is common:  To feel sleepy for several hours.  To feel clumsy and have poor balance for several hours.  To have poor judgment for several hours.  To vomit if you eat too soon.  Follow these instructions at home: For at least 24 hours after the procedure:   Do not: ? Participate in activities where you  could fall or become injured. ? Drive. ? Use heavy machinery. ? Drink alcohol. ? Take sleeping pills or medicines that cause drowsiness. ? Make important decisions or sign legal documents. ? Take care of children on your own.  Rest. Eating and drinking  Follow the diet recommended by your health care provider.  If you vomit: ? Drink water, juice, or soup when you can drink without vomiting. ? Make sure you have little or no nausea before eating solid foods. General instructions  Have a responsible adult stay with you until you are awake and alert.  Take over-the-counter and prescription medicines only as told by your health care provider.  If you smoke, do not smoke without supervision.  Keep all follow-up visits as told by your health care provider. This is important. Contact a health care provider if:  You keep feeling nauseous or you keep vomiting.  You feel light-headed.  You develop a rash.  You have a fever. Get help right away if:  You have trouble breathing. This information is not intended to replace advice given to you by your health care provider. Make sure you discuss any questions you have with your health care provider. Document Released: 04/11/2013 Document Revised: 11/24/2015 Document Reviewed: 10/11/2015 Elsevier Interactive Patient Education  [employer].

## 2017-09-14 NOTE — Anesthesia Postprocedure Evaluation (Signed)
Anesthesia Post Note  Patient: Julie Ryan  Procedure(s) Performed: DILATATION AND CURETTAGE /HYSTEROSCOPY (N/A ) ENDOMETRIAL ABLATION( Minerva) (N/A ) ENDOMETRIAL POLYPECTOMY  Patient location during evaluation: PACU Anesthesia Type: General Level of consciousness: awake and patient cooperative Pain management: pain level controlled Vital Signs Assessment: post-procedure vital signs reviewed and stable Respiratory status: spontaneous breathing, nonlabored ventilation and respiratory function stable Cardiovascular status: blood pressure returned to baseline Postop Assessment: no apparent nausea or vomiting Anesthetic complications: no     Last Vitals:  Vitals:   09/14/17 1030 09/14/17 1037  BP: (!) 115/58 112/63  Pulse: 62 65  Resp: 16 16  Temp:  36.6 C  SpO2: 95% 96%    Last Pain:  Vitals:   09/14/17 1037  TempSrc: Oral  PainSc:                  Andras Grunewald J

## 2017-09-14 NOTE — H&P (Signed)
Preoperative History and Physical  Julie Ryan is a 52 y.o. G2P2001 s/p tubal ligationwith Patient's last menstrual period was 08/31/2017 (approximate). admitted for a hysteroscopy uterine curretage removal of polyp endometrial ablation.  Pt has heavy menstrual periods and a pelvic sonogram has revealed an endometrial polyp Admitted for polypectomy uterine curettage and minerva endometrial ablation  PMH:    Past Medical History:  Diagnosis Date  . Anxiety   . Carpal tunnel syndrome of right wrist   . Chronic knee pain   . Depression   . Diabetes mellitus without complication ([place])   . Endometrial polyp 08/09/2017   will get appt with Dr Elonda Husky  . Fibroids 08/09/2017   Has multiple small fibroids   . GERD (gastroesophageal reflux disease)   . Hypertension   . Neuropathy   . Obesity   . Prediabetes   . Seizures ([place])    1 seizure as a chold; unknown etiology and has never been on meds  . Sleep apnea    did not go back to get results    PSH:     Past Surgical History:  Procedure Laterality Date  . APPENDECTOMY    . CARPAL TUNNEL RELEASE Right 08/01/2015   Procedure: RIGHT CARPAL TUNNEL RELEASE;  Surgeon: Carole Civil, MD;  Location: [facility];  Service: Orthopedics;  Laterality: Right;  . COLONOSCOPY N/A 06/15/2017   Procedure: COLONOSCOPY;  Surgeon: Rogene Houston, MD;  Location: [facility];  Service: Endoscopy;  Laterality: N/A;  830  . ESOPHAGOGASTRODUODENOSCOPY N/A 05/25/2013   Procedure: ESOPHAGOGASTRODUODENOSCOPY (EGD);  Surgeon: Rogene Houston, MD;  Location: [facility];  Service: Endoscopy;  Laterality: N/A;  220  . TUBAL LIGATION      POb/GynH:      OB History    Gravida Para Term Preterm AB Living   2 2 2     1    SAB TAB Ectopic Multiple Live Births           2      SH:   Social History   Tobacco Use  . Smoking status: Former Smoker    Packs/day: 0.25    Years: 5.00    Pack years: 1.25    Types: Cigarettes    Last attempt to quit: 03/04/2017     Years since quitting: 0.5  . Smokeless tobacco: Never Used  Substance Use Topics  . Alcohol use: No    Alcohol/week: 0.0 oz  . Drug use: No    FH:    Family History  Problem Relation Age of Onset  . Hypertension Mother   . Heart disease Mother   . Diabetes Mother   . Diabetes Sister   . Multiple sclerosis Sister   . Lupus Sister   . Heart disease Sister   . CVA Sister   . Diabetes Brother   . Hypertension Brother   . Other Son        brain tumor  . Hypertension Daughter   . Diabetes Sister      Allergies:  Allergies  Allergen Reactions  . Codeine Nausea Only and Other (See Comments)    Dizziness   . Effexor Xr [Venlafaxine Hcl Er] Other (See Comments)    Makes patient feel on edge   . Fluoxetine Other (See Comments)    States that med caused memory problems and crying spells   . Hydrocodone Nausea Only and Other (See Comments)    Dizziness   . Latex Itching and Rash  Medications:       Current Facility-Administered Medications:  .  ceFAZolin (ANCEF) IVPB 2g/100 mL premix, 2 g, Intravenous, On Call to OR, Florian Buff, MD  Review of Systems:   Review of Systems  Constitutional: Negative for fever, chills, weight loss, malaise/fatigue and diaphoresis.  HENT: Negative for hearing loss, ear pain, nosebleeds, congestion, sore throat, neck pain, tinnitus and ear discharge.   Eyes: Negative for blurred vision, double vision, photophobia, pain, discharge and redness.  Respiratory: Negative for cough, hemoptysis, sputum production, shortness of breath, wheezing and stridor.   Cardiovascular: Negative for chest pain, palpitations, orthopnea, claudication, leg swelling and PND.  Gastrointestinal: Positive for abdominal pain. Negative for heartburn, nausea, vomiting, diarrhea, constipation, blood in stool and melena.  Genitourinary: Negative for dysuria, urgency, frequency, hematuria and flank pain.  Musculoskeletal: Negative for myalgias, back pain, joint pain and  falls.  Skin: Negative for itching and rash.  Neurological: Negative for dizziness, tingling, tremors, sensory change, speech change, focal weakness, seizures, loss of consciousness, weakness and headaches.  Endo/Heme/Allergies: Negative for environmental allergies and polydipsia. Does not bruise/bleed easily.  Psychiatric/Behavioral: Negative for depression, suicidal ideas, hallucinations, memory loss and substance abuse. The patient is not nervous/anxious and does not have insomnia.      PHYSICAL EXAM:  Blood pressure 125/66, pulse 67, temperature 98 F (36.7 C), temperature source Oral, resp. rate 17, height 5\' 8"  (1.727 m), weight 256 lb (116.1 kg), last menstrual period 08/31/2017, SpO2 97 %.    Vitals reviewed. Constitutional: She is oriented to person, place, and time. She appears well-developed and well-nourished.  HENT:  Head: Normocephalic and atraumatic.  Right Ear: External ear normal.  Left Ear: External ear normal.  Nose: Nose normal.  Mouth/Throat: Oropharynx is clear and moist.  Eyes: Conjunctivae and EOM are normal. Pupils are equal, round, and reactive to light. Right eye exhibits no discharge. Left eye exhibits no discharge. No scleral icterus.  Neck: Normal range of motion. Neck supple. No tracheal deviation present. No thyromegaly present.  Cardiovascular: Normal rate, regular rhythm, normal heart sounds and intact distal pulses.  Exam reveals no gallop and no friction rub.   No murmur heard. Respiratory: Effort normal and breath sounds normal. No respiratory distress. She has no wheezes. She has no rales. She exhibits no tenderness.  GI: Soft. Bowel sounds are normal. She exhibits no distension and no mass. There is tenderness. There is no rebound and no guarding.  Genitourinary:       Vulva is normal without lesions Vagina is pink moist without discharge Cervix normal in appearance and pap is normal Uterus is normal size, contour, position, consistency,  mobility, non-tender Adnexa is negative with normal sized ovaries by sonogram  Musculoskeletal: Normal range of motion. She exhibits no edema and no tenderness.  Neurological: She is alert and oriented to person, place, and time. She has normal reflexes. She displays normal reflexes. No cranial nerve deficit. She exhibits normal muscle tone. Coordination normal.  Skin: Skin is warm and dry. No rash noted. No erythema. No pallor.  Psychiatric: She has a normal mood and affect. Her behavior is normal. Judgment and thought content normal.    Labs: Results for orders placed or performed during the hospital encounter of 09/14/17 (from the past 336 hour(s))  Glucose, capillary   Collection Time: 09/14/17  7:43 AM  Result Value Ref Range   Glucose-Capillary 107 (H) 65 - 99 mg/dL  Results for orders placed or performed during the hospital encounter of 09/07/17 (  from the past 336 hour(s))  Glucose, capillary   Collection Time: 09/07/17  1:00 PM  Result Value Ref Range   Glucose-Capillary 130 (H) 65 - 99 mg/dL  CBC   Collection Time: 09/07/17  1:02 PM  Result Value Ref Range   WBC 8.1 4.0 - 10.5 K/uL   RBC 4.44 3.87 - 5.11 MIL/uL   Hemoglobin 12.7 12.0 - 15.0 g/dL   HCT 40.0 36.0 - 46.0 %   MCV 90.1 78.0 - 100.0 fL   MCH 28.6 26.0 - 34.0 pg   MCHC 31.8 30.0 - 36.0 g/dL   RDW 13.9 11.5 - 15.5 %   Platelets 340 150 - 400 K/uL  Comprehensive metabolic panel   Collection Time: 09/07/17  1:02 PM  Result Value Ref Range   Sodium 136 135 - 145 mmol/L   Potassium 3.6 3.5 - 5.1 mmol/L   Chloride 102 101 - 111 mmol/L   CO2 23 22 - 32 mmol/L   Glucose, Bld 124 (H) 65 - 99 mg/dL   BUN 12 6 - 20 mg/dL   Creatinine, Ser 0.81 0.44 - 1.00 mg/dL   Calcium 9.1 8.9 - 10.3 mg/dL   Total Protein 7.7 6.5 - 8.1 g/dL   Albumin 3.7 3.5 - 5.0 g/dL   AST 18 15 - 41 U/L   ALT 14 14 - 54 U/L   Alkaline Phosphatase 97 38 - 126 U/L   Total Bilirubin 0.7 0.3 - 1.2 mg/dL   GFR calc non Af Amer >60 >60 mL/min    GFR calc Af Amer >60 >60 mL/min   Anion gap 11 5 - 15  hCG, quantitative, pregnancy   Collection Time: 09/07/17  1:02 PM  Result Value Ref Range   hCG, Beta Chain, Quant, S 1 <5 mIU/mL  Rapid HIV screen (HIV 1/2 Ab+Ag)   Collection Time: 09/07/17  1:02 PM  Result Value Ref Range   HIV-1 P24 Antigen - HIV24 NON REACTIVE NON REACTIVE   HIV 1/2 Antibodies NON REACTIVE NON REACTIVE   Interpretation (HIV Ag Ab)      A non reactive test result means that HIV 1 or HIV 2 antibodies and HIV 1 p24 antigen were not detected in the specimen.  Urinalysis, Routine w reflex microscopic   Collection Time: 09/07/17  1:02 PM  Result Value Ref Range   Color, Urine YELLOW YELLOW   APPearance HAZY (A) CLEAR   Specific Gravity, Urine 1.023 1.005 - 1.030   pH 5.0 5.0 - 8.0   Glucose, UA NEGATIVE NEGATIVE mg/dL   Hgb urine dipstick MODERATE (A) NEGATIVE   Bilirubin Urine NEGATIVE NEGATIVE   Ketones, ur NEGATIVE NEGATIVE mg/dL   Protein, ur NEGATIVE NEGATIVE mg/dL   Nitrite POSITIVE (A) NEGATIVE   Leukocytes, UA TRACE (A) NEGATIVE   RBC / HPF 0-5 0 - 5 RBC/hpf   WBC, UA 6-30 0 - 5 WBC/hpf   Bacteria, UA FEW (A) NONE SEEN   Squamous Epithelial / LPF 0-5 (A) NONE SEEN   Mucus PRESENT   Hemoglobin A1c   Collection Time: 09/07/17  1:02 PM  Result Value Ref Range   Hgb A1c MFr Bld 5.7 (H) 4.8 - 5.6 %   Mean Plasma Glucose 116.89 mg/dL    EKG: Orders placed or performed during the hospital encounter of 09/07/17  . EKG 12-Lead  . EKG 12-Lead    Imaging Studies: No results found.    Assessment: mneorrhagia Dysmenorrhea Endometrial polyp Patient Active Problem List   Diagnosis Date  Noted  . Endometrial polyp 08/09/2017  . Fibroids 08/09/2017  . Spotting 07/18/2017  . Vaginal dryness 07/18/2017  . Depression 07/18/2017  . Hyperlipidemia LDL goal <100 06/27/2017  . Trigger finger, right ring finger 06/27/2017  . Leg edema 03/08/2017  . Special screening for malignant neoplasms, colon  01/26/2017  . GAD (generalized anxiety disorder) 06/01/2016  . Metabolic syndrome X 87/56/4332  . Erosive esophagitis 06/25/2013  . GERD (gastroesophageal reflux disease) 03/22/2013  . Left knee pain 03/22/2013  . Snoring 08/05/2011  . Vitamin D deficiency 03/21/2011  . Allergic rhinitis 09/20/2010  . FATIGUE 09/15/2010  . Prediabetes 04/16/2010  . Back pain 03/17/2010  . MORBID OBESITY 02/11/2010  . Essential hypertension 02/11/2010    Plan: hysterscopy with removal of polyp, uterine curretage endometrial ablation  Florian Buff 09/14/2017 8:19 AM

## 2017-09-14 NOTE — Transfer of Care (Signed)
Immediate Anesthesia Transfer of Care Note  Patient: Julie Ryan  Procedure(s) Performed: DILATATION AND CURETTAGE /HYSTEROSCOPY (N/A ) ENDOMETRIAL ABLATION( Minerva) (N/A ) ENDOMETRIAL POLYPECTOMY  Patient Location: PACU  Anesthesia Type:General  Level of Consciousness: awake and patient cooperative  Airway & Oxygen Therapy: Patient Spontanous Breathing and Patient connected to face mask oxygen  Post-op Assessment: Report given to RN, Post -op Vital signs reviewed and stable and Patient moving all extremities  Post vital signs: Reviewed and stable  Last Vitals:  Vitals:   09/14/17 0835 09/14/17 0840  BP: 124/64 124/64  Pulse:    Resp: 17 18  Temp:    SpO2: 98% 98%    Last Pain:  Vitals:   09/14/17 0744  TempSrc: Oral      Patients Stated Pain Goal: 7 (14/78/29 5621)  Complications: No apparent anesthesia complications

## 2017-09-15 ENCOUNTER — Encounter (HOSPITAL_COMMUNITY): Payer: Self-pay | Admitting: Obstetrics & Gynecology

## 2017-09-16 ENCOUNTER — Encounter (HOSPITAL_COMMUNITY): Payer: Self-pay | Admitting: Obstetrics & Gynecology

## 2017-09-20 ENCOUNTER — Ambulatory Visit: Payer: BC Managed Care – PPO | Admitting: Obstetrics & Gynecology

## 2017-09-20 ENCOUNTER — Encounter: Payer: Self-pay | Admitting: Obstetrics & Gynecology

## 2017-09-20 VITALS — BP 140/78 | Ht 66.0 in | Wt 262.0 lb

## 2017-09-20 DIAGNOSIS — Z9889 Other specified postprocedural states: Secondary | ICD-10-CM

## 2017-09-20 NOTE — Progress Notes (Signed)
  HPI: Patient returns for routine postoperative follow-up having undergone hysteroscopy uterine curettage removal of polyp Minerva ablation on 09/14/2017.  The patient's immediate postoperative recovery has been unremarkable. Since hospital discharge the patient reports watery discharge.   Current Outpatient Medications: Black Currant Seed Oil 500 MG CAPS, Take 500 mg by mouth 2 (two) times daily. , Disp: , Rfl:  gabapentin (NEURONTIN) 300 MG capsule, TAKE 1 CAPSULE BY MOUTH AT BEDTIME (Patient taking differently: TAKE 300 MG BY MOUTH AT BEDTIME), Disp: 90 capsule, Rfl: 1 ibuprofen (ADVIL,MOTRIN) 800 MG tablet, TAKE 1 TABLET BY MOUTH EVERY 8 HOURS AS NEEDED, Disp: 21 tablet, Rfl: 0 loratadine (CLARITIN) 10 MG tablet, TAKE ONE TABLET BY MOUTH ONCE DAILY (Patient taking differently: TAKE 10 MG BY MOUTH ONCE DAILY), Disp: 30 tablet, Rfl: 11 lovastatin (MEVACOR) 20 MG tablet, TAKE 2 TABLETS BY MOUTH ONCE DAILY AT BEDTIME, Disp: 180 tablet, Rfl: 1 meloxicam (MOBIC) 15 MG tablet, Take 1 tablet (15 mg total) by mouth daily., Disp: 30 tablet, Rfl: 5 metFORMIN (GLUCOPHAGE-XR) 500 MG 24 hr tablet, TAKE ONE TABLET BY MOUTH ONCE DAILY WITH BREAKFAST (Patient taking differently: TAKE 500 MG BY MOUTH ONCE DAILY WITH BREAKFAST), Disp: 90 tablet, Rfl: 1 metoprolol tartrate (LOPRESSOR) 25 MG tablet, TAKE 1 TABLET BY MOUTH TWICE DAILY FOR BLOOD PRESSURE, Disp: 180 tablet, Rfl: 1 phentermine (ADIPEX-P) 37.5 MG tablet, Take 1 tablet (37.5 mg total) by mouth daily before breakfast. (Patient taking differently: Take 18.75 mg by mouth daily before breakfast. ), Disp: 30 tablet, Rfl: 1 potassium chloride SA (K-DUR,KLOR-CON) 20 MEQ tablet, One tablet once daily as needed on days when you take the lasix (Patient taking differently: Take 20 mEq by mouth daily as needed (when taking Lasix). ), Disp: 10 tablet, Rfl: 0 ranitidine (ZANTAC) 150 MG tablet, TAKE 1 TABLET BY MOUTH TWICE DAILY (Patient taking differently: TAKE 150 MG  BY MOUTH TWICE DAILY), Disp: 180 tablet, Rfl: 1 vitamin B-12 (CYANOCOBALAMIN) 500 MCG tablet, Take 500 mcg by mouth daily., Disp: , Rfl:  conjugated estrogens (PREMARIN) vaginal cream, Use .5 gm in vagina 2-3 x weekly (Patient not taking: Reported on 09/20/2017), Disp: 30 g, Rfl: 4 furosemide (LASIX) 20 MG tablet, Onen tablet once daily as needed (Patient not taking: Reported on 09/20/2017), Disp: 10 tablet, Rfl: 0 ketorolac (TORADOL) 10 MG tablet, Take 1 tablet (10 mg total) by mouth every 8 (eight) hours as needed. (Patient not taking: Reported on 09/20/2017), Disp: 15 tablet, Rfl: 0 ondansetron (ZOFRAN ODT) 8 MG disintegrating tablet, Take 1 tablet (8 mg total) by mouth every 8 (eight) hours as needed for nausea or vomiting. (Patient not taking: Reported on 09/20/2017), Disp: 20 tablet, Rfl: 0 oxycodone-acetaminophen (PERCOCET) 2.5-325 MG tablet, Take 1-2 tablets by mouth every 4 (four) hours as needed for pain. (Patient not taking: Reported on 09/20/2017), Disp: 15 tablet, Rfl: 0 predniSONE (STERAPRED UNI-PAK 21 TAB) 5 MG (21) TBPK tablet, TAKE BY MOUTH AS DIRECTED PER PACKAGE INSTRUCTIONS (Patient not taking: Reported on 09/20/2017), Disp: 21 tablet, Rfl: 0  No current facility-administered medications for this visit.     Blood pressure 140/78, height 5\' 6"  (1.676 m), weight 262 lb (118.8 kg), last menstrual period 08/31/2017.  Physical Exam: Normal post ablation, copious discharge, normal appearance  Diagnostic Tests:   Pathology: benign  Impression: S/p hysteroscopy uterine curettage Minerva ablation  Plan:   Follow up: 1  years  Florian Buff, MD

## 2017-09-21 ENCOUNTER — Encounter: Payer: BC Managed Care – PPO | Admitting: Obstetrics & Gynecology

## 2017-09-27 ENCOUNTER — Other Ambulatory Visit: Payer: Self-pay | Admitting: Family Medicine

## 2017-10-17 ENCOUNTER — Ambulatory Visit: Payer: Self-pay | Admitting: Family Medicine

## 2017-10-27 ENCOUNTER — Ambulatory Visit: Payer: BC Managed Care – PPO | Admitting: Adult Health

## 2017-10-27 ENCOUNTER — Encounter: Payer: Self-pay | Admitting: Adult Health

## 2017-10-27 VITALS — BP 118/68 | HR 90 | Ht 66.0 in | Wt 261.0 lb

## 2017-10-27 DIAGNOSIS — N3946 Mixed incontinence: Secondary | ICD-10-CM

## 2017-10-27 DIAGNOSIS — N898 Other specified noninflammatory disorders of vagina: Secondary | ICD-10-CM | POA: Diagnosis not present

## 2017-10-27 MED ORDER — MIRABEGRON ER 25 MG PO TB24: 25.0000 mg | ORAL_TABLET | Freq: Every day | ORAL | 0 refills | Status: DC

## 2017-10-27 MED ORDER — ESTROGENS, CONJUGATED 0.625 MG/GM VA CREA: TOPICAL_CREAM | VAGINAL | 1 refills | Status: DC

## 2017-10-27 NOTE — Progress Notes (Signed)
Subjective:     Patient ID: Julie Ryan, female   DOB: 04-18-1966, 52 y.o.   MRN: [id number]  HPI Julie Ryan is a 52 year old black female, in complaining of peeing with orgasm.She also has UI, and is up at night 2-3 times. Seems to have gotten worse since hysteroscopy and D&C and ablation 09/14/17. But boyfriend she had before surgery.   Review of Systems Pees with orgasm UI Vaginal dryness  Reviewed past medical,surgical, social and family history. Reviewed medications and allergies.     Objective:   Physical Exam BP 118/68 (BP Location: Right Arm, Patient Position: Sitting, Cuff Size: Large)   Pulse 90   Ht 5\' 6"  (1.676 m)   Wt 261 lb (118.4 kg)   BMI 42.13 kg/m    Skin warm and dry.Pelvic: external genitalia is normal in appearance no lesions, vagina: scant discharge,urethra has no lesions or masses noted, cervix:smooth and bulbous, uterus: normal size, shape and contour, non tender, no masses felt, adnexa: no masses or tenderness noted. Bladder is non tender and no masses felt. Will try kegel's, PVC  And myrbetriq.Try to not have long sex sessions.  Face time 15 minutes with 54% counseling pt and partner.  Assessment:     1. Mixed stress and urge urinary incontinence   2. Vaginal dryness       Plan:    Void before sex,stand and void again  Do Kegel exercises  Meds ordered this encounter  Medications  . mirabegron ER (MYRBETRIQ) 25 MG TB24 tablet    Sig: Take 1 tablet (25 mg total) by mouth daily.    Dispense:  28 tablet    Refill:  0    Order Specific Question:   Supervising Provider    Answer:   Elonda Husky, LUTHER H [[id number]]  . conjugated estrogens (PREMARIN) vaginal cream    Sig: Use .5 gm in vagina 2-3 x weekly    Dispense:  42.5 g    Refill:  1    Order Specific Question:   Supervising Provider    Answer:   Elonda Husky, LUTHER H [[id number]]  F/U in 3 weeks

## 2017-11-02 ENCOUNTER — Other Ambulatory Visit: Payer: Self-pay | Admitting: Family Medicine

## 2017-11-02 ENCOUNTER — Other Ambulatory Visit: Payer: Self-pay

## 2017-11-02 ENCOUNTER — Telehealth: Payer: Self-pay | Admitting: Family Medicine

## 2017-11-02 MED ORDER — PHENTERMINE HCL 37.5 MG PO TABS: 37.5000 mg | ORAL_TABLET | Freq: Every day | ORAL | 0 refills | Status: DC

## 2017-11-02 NOTE — Telephone Encounter (Signed)
Called patient to r/s her appt until 12/12/17, she wanted to know if you received her rx request for phentermine. Pharmacy: [facility] in [place]  Cb#: [phone]

## 2017-11-02 NOTE — Telephone Encounter (Signed)
Will give 1 refill

## 2017-11-03 IMAGING — DX DG WRIST COMPLETE 3+V*R*
4 series · 4 of 4 positions shown · non-contrast
Comparison: None.

CLINICAL DATA: Wrist pain and swelling.

EXAM:
RIGHT WRIST - COMPLETE 3+ VIEW

[wrist pa (1 of 2)]
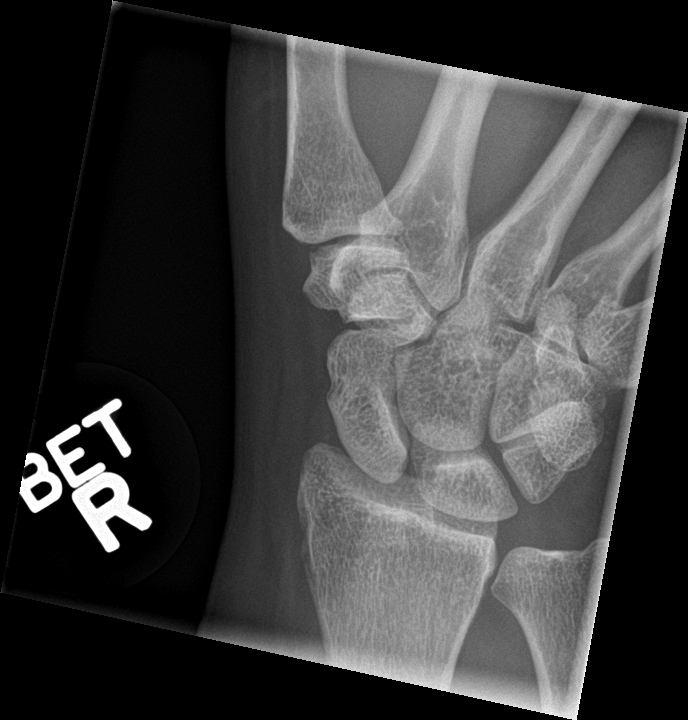

[wrist obl]
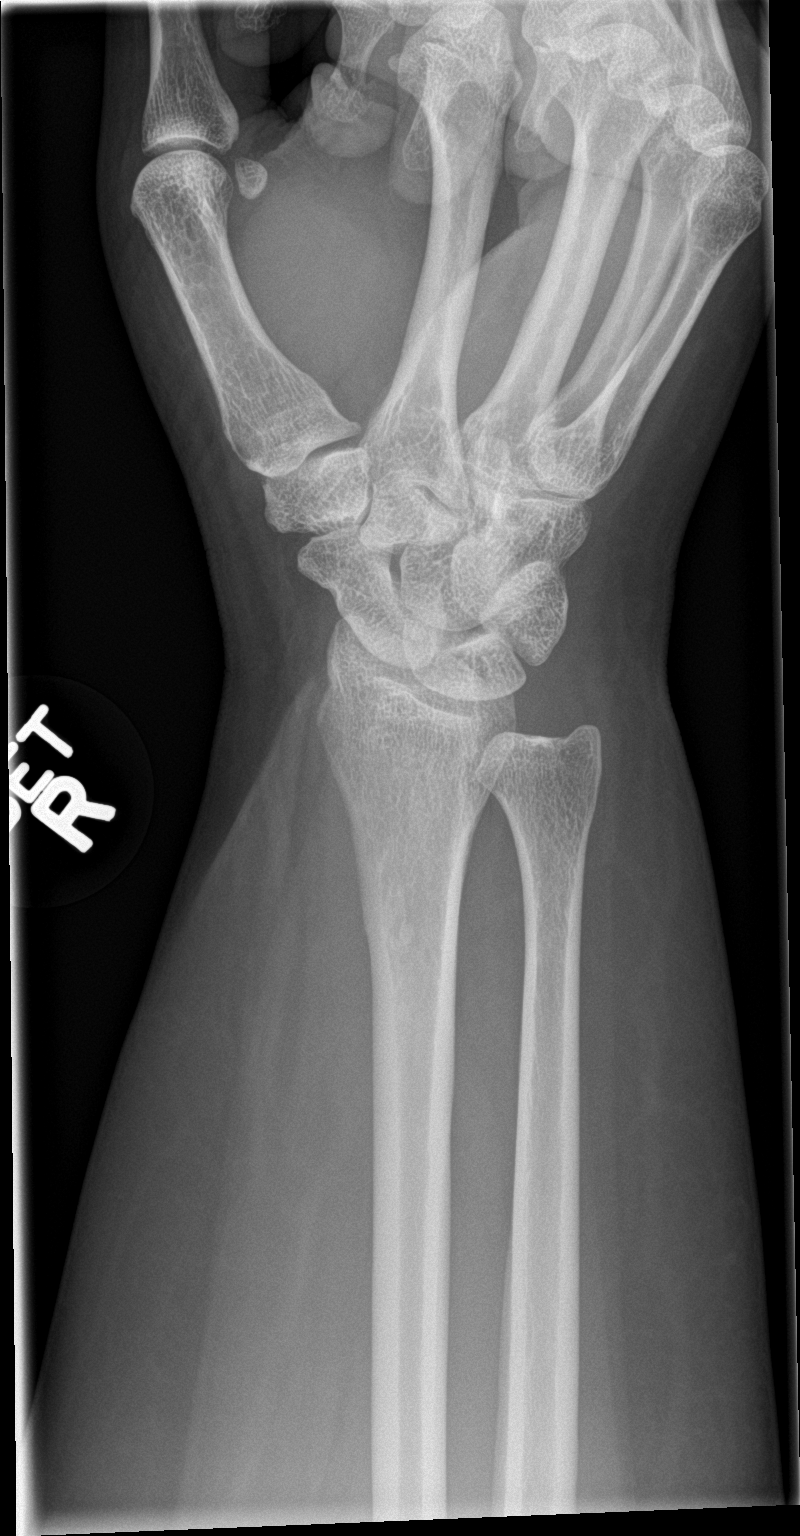

[wrist lat]
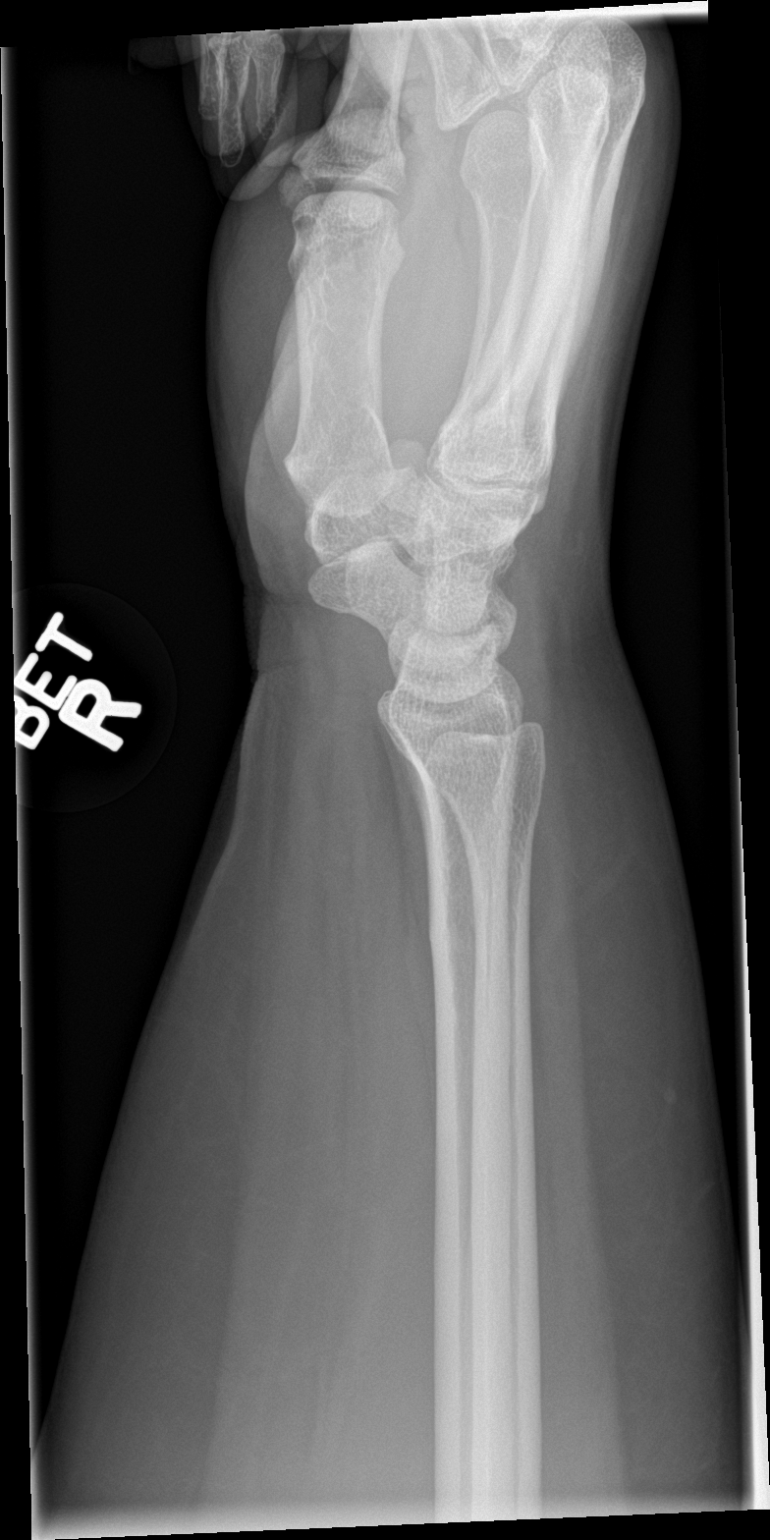

[wrist pa (2 of 2)]
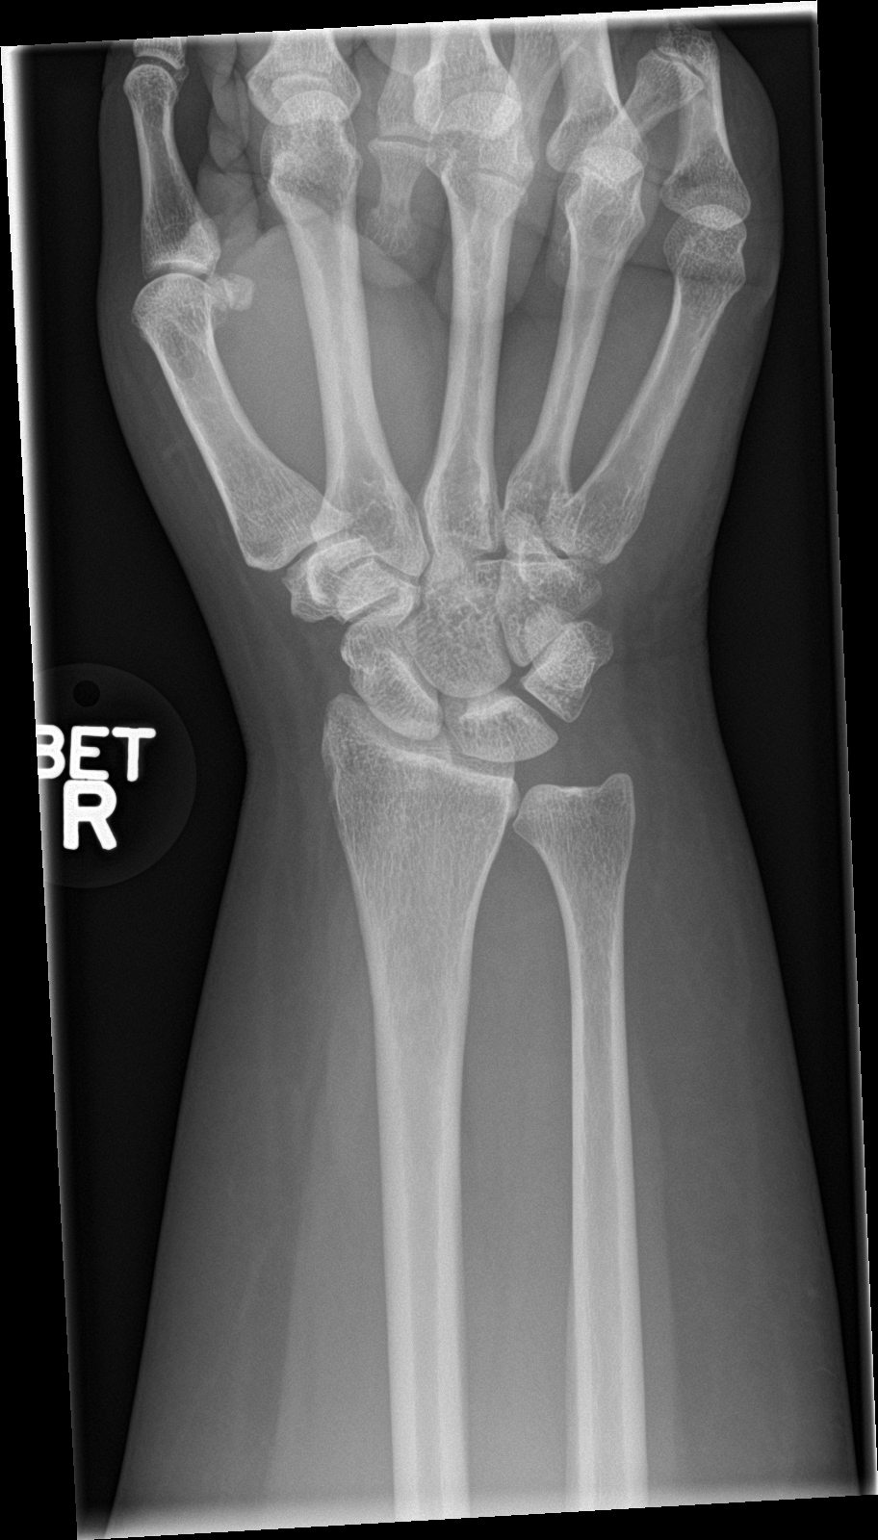

[4 of 4 positions shown; findings below may reference images not displayed]

FINDINGS: There is no evidence of fracture or dislocation. There is no
evidence of arthropathy or other focal bone abnormality. Soft
tissues are unremarkable.
IMPRESSION: Negative right wrist radiographs.

## 2017-11-07 ENCOUNTER — Ambulatory Visit: Payer: BC Managed Care – PPO | Admitting: Family Medicine

## 2017-11-17 ENCOUNTER — Encounter: Payer: Self-pay | Admitting: Adult Health

## 2017-11-17 ENCOUNTER — Ambulatory Visit (INDEPENDENT_AMBULATORY_CARE_PROVIDER_SITE_OTHER): Payer: BC Managed Care – PPO | Admitting: Adult Health

## 2017-11-17 VITALS — BP 120/70 | HR 89 | Ht 66.0 in | Wt 258.0 lb

## 2017-11-17 DIAGNOSIS — N898 Other specified noninflammatory disorders of vagina: Secondary | ICD-10-CM | POA: Diagnosis not present

## 2017-11-17 DIAGNOSIS — N3946 Mixed incontinence: Secondary | ICD-10-CM | POA: Diagnosis not present

## 2017-11-17 MED ORDER — ESTROGENS, CONJUGATED 0.625 MG/GM VA CREA: TOPICAL_CREAM | VAGINAL | 1 refills | Status: DC

## 2017-11-17 MED ORDER — MIRABEGRON ER 25 MG PO TB24: 25.0000 mg | ORAL_TABLET | Freq: Every day | ORAL | 6 refills | Status: DC

## 2017-11-17 NOTE — Progress Notes (Signed)
  Subjective:     Patient ID: Julie Ryan, female   DOB: [birth year], 52 y.o.   MRN: [id number]  HPI Julie Ryan is a 52 year old black female back in follow up of UI and vaginal dryness and is better.  Review of Systems Has better vaginal moisture  UI is better Did not pee with orgasm Reviewed past medical,surgical, social and family history. Reviewed medications and allergies.     Objective:   Physical Exam BP 120/70 (BP Location: Left Arm, Patient Position: Sitting, Cuff Size: Large)   Pulse 89   Ht 5\' 6"  (1.676 m)   Wt 258 lb (117 kg)   BMI 41.64 kg/m Talk only:   Will continue PVC and myrbetriq, and do Kegel's.   Assessment:     1. Vaginal dryness   2. Mixed stress and urge urinary incontinence       Plan:    Do Kegel's  Meds ordered this encounter  Medications  . conjugated estrogens (PREMARIN) vaginal cream    Sig: Use .5 gm in vagina 2-3 x weekly    Dispense:  42.5 g    Refill:  1    Order Specific Question:   Supervising Provider    Answer:   EURE, LUTHER H [[id number]]  . mirabegron ER (MYRBETRIQ) 25 MG TB24 tablet    Sig: Take 1 tablet (25 mg total) by mouth daily.    Dispense:  30 tablet    Refill:  6    Order Specific Question:   Supervising Provider    Answer:   Tania Ade H [[id number]]  F/U prn

## 2017-11-24 ENCOUNTER — Other Ambulatory Visit: Payer: Self-pay | Admitting: Family Medicine

## 2017-12-11 ENCOUNTER — Other Ambulatory Visit: Payer: Self-pay | Admitting: Family Medicine

## 2017-12-11 DIAGNOSIS — M79641 Pain in right hand: Secondary | ICD-10-CM

## 2017-12-12 ENCOUNTER — Encounter: Payer: Self-pay | Admitting: Family Medicine

## 2017-12-12 ENCOUNTER — Ambulatory Visit: Payer: BC Managed Care – PPO | Admitting: Family Medicine

## 2017-12-12 VITALS — BP 120/84 | HR 103 | Resp 16 | Ht 66.0 in | Wt 257.0 lb

## 2017-12-12 DIAGNOSIS — I10 Essential (primary) hypertension: Secondary | ICD-10-CM | POA: Diagnosis not present

## 2017-12-12 DIAGNOSIS — E8881 Metabolic syndrome: Secondary | ICD-10-CM | POA: Diagnosis not present

## 2017-12-12 DIAGNOSIS — E785 Hyperlipidemia, unspecified: Secondary | ICD-10-CM | POA: Diagnosis not present

## 2017-12-12 DIAGNOSIS — R7303 Prediabetes: Secondary | ICD-10-CM | POA: Diagnosis not present

## 2017-12-12 MED ORDER — PHENTERMINE HCL 37.5 MG PO TABS: 37.5000 mg | ORAL_TABLET | Freq: Every day | ORAL | 1 refills | Status: DC

## 2017-12-12 NOTE — Patient Instructions (Signed)
F/U in 4 months, call iof you need me before  Please schedule your mammogram as we discussed  Congrats on excellent weight loss , start regular exercise 30 minutes every day  Weight loss goal of 10 to 15 pounds  No change , take half phentermine daily  Thank you  for choosing [facility]. We consider it a privelige to serve you.  Delivering excellent health care in a caring and  compassionate way is our goal.  Partnering with you,  so that together we can achieve this goal is our strategy.

## 2017-12-12 NOTE — Progress Notes (Signed)
Julie Ryan     MRN: [id number]      DOB: July 04, 1966   HPI Julie Ryan is here for follow up and re-evaluation of chronic medical conditions, medication management and review of any available recent lab and radiology data.  Preventive health is updated, specifically  Cancer screening and Immunization.   Questions or concerns regarding consultations or procedures which the PT has had in the interim are  addressed. The PT denies any adverse reactions to current medications since the last visit.  2 episodes when she felt heaviness in her eyes x 3 minutes, and this recurred , primarily associated with change in position, when she raises her head Doing very well with phentermine as far as portion size , will increase her phisicaal activity to help with weight loss  ROS Denies recent fever or chills. Denies sinus pressure, nasal congestion, ear pain or sore throat. Denies chest congestion, productive cough or wheezing. Denies chest pains, palpitations and leg swelling Denies abdominal pain, nausea, vomiting,diarrhea or constipation.   Denies dysuria, frequency, hesitancy or incontinence. Denies joint pain, swelling and limitation in mobility. Denies headaches, seizures, numbness, or tingling. Denies depression, anxiety or insomnia. Denies skin break down or rash.   PE  BP 120/84 (BP Location: Left Arm, Cuff Size: Large)   Pulse (!) 103   Resp 16   Ht 5\' 6"  (1.676 m)   Wt 257 lb (116.6 kg)   SpO2 97%   BMI 41.48 kg/m   Patient alert and oriented and in no cardiopulmonary distress.  HEENT: No facial asymmetry, EOMI,   oropharynx pink and moist.  Neck supple no JVD, no mass.  Chest: Clear to auscultation bilaterally.  CVS: S1, S2 no murmurs, no S3.Regular rate.  ABD: Soft non tender.   Ext: No edema  MS: Adequate ROM spine, shoulders, hips and knees.  Skin: Intact, no ulcerations or rash noted.  Psych: Good eye contact, normal affect. Memory intact not anxious or  depressed appearing.  CNS: CN 2-12 intact, power,  normal throughout.no focal deficits noted.   Assessment & Plan  Essential hypertension Controlled, no change in medication DASH diet and commitment to daily physical activity for a minimum of 30 minutes discussed and encouraged, as a part of hypertension management. The importance of attaining a healthy weight is also discussed.  BP/Weight 12/12/2017 11/17/2017 10/27/2017 09/20/2017 09/14/2017 01/10/2955 08/05/3084  Systolic BP 578 469 629 528 413 244 010  Diastolic BP 84 70 68 78 63 91 62  Wt. (Lbs) 257 258 261 262 256 256 256  BMI 41.48 41.64 42.13 42.29 38.92 38.92 38.92       MORBID OBESITY Improved , continue half phentermine, 10 pound weight loss goal for next 4 months Patient re-educated about  the importance of commitment to a  minimum of 150 minutes of exercise per week.  The importance of healthy food choices with portion control discussed. Encouraged to start a food diary, count calories and to consider  joining a support group. Sample diet sheets offered. Goals set by the patient for the next several months.   Weight /BMI 12/12/2017 11/17/2017 10/27/2017  WEIGHT 257 lb 258 lb 261 lb  HEIGHT 5\' 6"  5\' 6"  5\' 6"   BMI 41.48 kg/m2 41.64 kg/m2 42.13 kg/m2      Hyperlipidemia LDL goal <100 Controlled, no change in medication Hyperlipidemia:Low fat diet discussed and encouraged.   Lipid Panel  Lab Results  Component Value Date   CHOL 142 06/21/2017   HDL 55  06/21/2017   LDLCALC 71 06/21/2017   TRIG 75 06/21/2017   CHOLHDL 2.6 06/21/2017       Prediabetes Patient educated about the importance of limiting  Carbohydrate intake , the need to commit to daily physical activity for a minimum of 30 minutes , and to commit weight loss. The fact that changes in all these areas will reduce or eliminate all together the development of diabetes is stressed.   Diabetic Labs Latest Ref Rng & Units 09/07/2017 06/21/2017 12/02/2016  06/01/2016 12/16/2015  HbA1c 4.8 - 5.6 % 5.7(H) 6.1(H) 5.9(H) 5.7(H) 5.9(H)  Chol <200 mg/dL - 142 - 163 -  HDL >50 mg/dL - 55 - 61 -  Calc LDL mg/dL (calc) - 71 - 85 -  Triglycerides <150 mg/dL - 75 - 83 -  Creatinine 0.44 - 1.00 mg/dL 0.81 0.82 0.92 0.99 -   BP/Weight 12/12/2017 11/17/2017 10/27/2017 09/20/2017 09/14/2017 07/10/1094 0/10/5407  Systolic BP 811 914 782 956 213 086 578  Diastolic BP 84 70 68 78 63 91 62  Wt. (Lbs) 257 258 261 262 256 256 256  BMI 41.48 41.64 42.13 42.29 38.92 38.92 38.92   Foot/eye exam completion dates Latest Ref Rng & Units 12/09/2016  Eye Exam No Retinopathy No Retinopathy  Foot Form Completion - -   Updated lab needed     Metabolic syndrome X The increased risk of cardiovascular disease associated with this diagnosis, and the need to consistently work on lifestyle to change this is discussed. Following  a  heart healthy diet ,commitment to 30 minutes of exercise at least 5 days per week, as well as control of blood sugar and cholesterol , and achieving a healthy weight are all the areas to be addressed .

## 2017-12-18 ENCOUNTER — Encounter: Payer: Self-pay | Admitting: Family Medicine

## 2017-12-18 NOTE — Assessment & Plan Note (Signed)
Controlled, no change in medication Hyperlipidemia:Low fat diet discussed and encouraged.   Lipid Panel  Lab Results  Component Value Date   CHOL 142 06/21/2017   HDL 55 06/21/2017   LDLCALC 71 06/21/2017   TRIG 75 06/21/2017   CHOLHDL 2.6 06/21/2017

## 2017-12-18 NOTE — Assessment & Plan Note (Signed)
Controlled, no change in medication DASH diet and commitment to daily physical activity for a minimum of 30 minutes discussed and encouraged, as a part of hypertension management. The importance of attaining a healthy weight is also discussed.  BP/Weight 12/12/2017 11/17/2017 10/27/2017 09/20/2017 09/14/2017 5/0/5183 09/06/8249  Systolic BP 898 421 031 281 188 677 373  Diastolic BP 84 70 68 78 63 91 62  Wt. (Lbs) 257 258 261 262 256 256 256  BMI 41.48 41.64 42.13 42.29 38.92 38.92 38.92

## 2017-12-18 NOTE — Assessment & Plan Note (Signed)
The increased risk of cardiovascular disease associated with this diagnosis, and the need to consistently work on lifestyle to change this is discussed. Following  a  heart healthy diet ,commitment to 30 minutes of exercise at least 5 days per week, as well as control of blood sugar and cholesterol , and achieving a healthy weight are all the areas to be addressed .  

## 2017-12-18 NOTE — Assessment & Plan Note (Signed)
Improved , continue half phentermine, 10 pound weight loss goal for next 4 months Patient re-educated about  the importance of commitment to a  minimum of 150 minutes of exercise per week.  The importance of healthy food choices with portion control discussed. Encouraged to start a food diary, count calories and to consider  joining a support group. Sample diet sheets offered. Goals set by the patient for the next several months.   Weight /BMI 12/12/2017 11/17/2017 10/27/2017  WEIGHT 257 lb 258 lb 261 lb  HEIGHT 5\' 6"  5\' 6"  5\' 6"   BMI 41.48 kg/m2 41.64 kg/m2 42.13 kg/m2

## 2017-12-18 NOTE — Assessment & Plan Note (Signed)
Patient educated about the importance of limiting  Carbohydrate intake , the need to commit to daily physical activity for a minimum of 30 minutes , and to commit weight loss. The fact that changes in all these areas will reduce or eliminate all together the development of diabetes is stressed.   Diabetic Labs Latest Ref Rng & Units 09/07/2017 06/21/2017 12/02/2016 06/01/2016 12/16/2015  HbA1c 4.8 - 5.6 % 5.7(H) 6.1(H) 5.9(H) 5.7(H) 5.9(H)  Chol <200 mg/dL - 142 - 163 -  HDL >50 mg/dL - 55 - 61 -  Calc LDL mg/dL (calc) - 71 - 85 -  Triglycerides <150 mg/dL - 75 - 83 -  Creatinine 0.44 - 1.00 mg/dL 0.81 0.82 0.92 0.99 -   BP/Weight 12/12/2017 11/17/2017 10/27/2017 09/20/2017 09/14/2017 09/06/4560 11/08/3891  Systolic BP 734 287 681 157 262 035 597  Diastolic BP 84 70 68 78 63 91 62  Wt. (Lbs) 257 258 261 262 256 256 256  BMI 41.48 41.64 42.13 42.29 38.92 38.92 38.92   Foot/eye exam completion dates Latest Ref Rng & Units 12/09/2016  Eye Exam No Retinopathy No Retinopathy  Foot Form Completion - -   Updated lab needed

## 2018-01-29 ENCOUNTER — Other Ambulatory Visit: Payer: Self-pay | Admitting: Family Medicine

## 2018-02-18 ENCOUNTER — Other Ambulatory Visit: Payer: Self-pay | Admitting: Family Medicine

## 2018-03-16 ENCOUNTER — Other Ambulatory Visit: Payer: Self-pay | Admitting: Family Medicine

## 2018-03-16 DIAGNOSIS — Z1231 Encounter for screening mammogram for malignant neoplasm of breast: Secondary | ICD-10-CM

## 2018-03-21 ENCOUNTER — Ambulatory Visit (INDEPENDENT_AMBULATORY_CARE_PROVIDER_SITE_OTHER): Payer: BC Managed Care – PPO

## 2018-03-21 DIAGNOSIS — Z111 Encounter for screening for respiratory tuberculosis: Secondary | ICD-10-CM

## 2018-03-21 NOTE — Progress Notes (Signed)
PPD placed in right arm. Advised to come back Thursday afternoon to have read

## 2018-03-23 LAB — TB SKIN TEST
INDURATION: 0 mm
TB SKIN TEST: NEGATIVE

## 2018-03-24 ENCOUNTER — Other Ambulatory Visit: Payer: Self-pay | Admitting: Family Medicine

## 2018-04-12 ENCOUNTER — Encounter: Payer: Self-pay | Admitting: Family Medicine

## 2018-04-12 ENCOUNTER — Ambulatory Visit (INDEPENDENT_AMBULATORY_CARE_PROVIDER_SITE_OTHER): Payer: BC Managed Care – PPO | Admitting: Family Medicine

## 2018-04-12 VITALS — BP 118/70 | HR 79 | Resp 12 | Ht 66.0 in | Wt 270.0 lb

## 2018-04-12 DIAGNOSIS — R0683 Snoring: Secondary | ICD-10-CM

## 2018-04-12 DIAGNOSIS — E785 Hyperlipidemia, unspecified: Secondary | ICD-10-CM

## 2018-04-12 DIAGNOSIS — Z23 Encounter for immunization: Secondary | ICD-10-CM | POA: Diagnosis not present

## 2018-04-12 DIAGNOSIS — F322 Major depressive disorder, single episode, severe without psychotic features: Secondary | ICD-10-CM

## 2018-04-12 DIAGNOSIS — M62830 Muscle spasm of back: Secondary | ICD-10-CM | POA: Diagnosis not present

## 2018-04-12 DIAGNOSIS — I10 Essential (primary) hypertension: Secondary | ICD-10-CM

## 2018-04-12 DIAGNOSIS — R7303 Prediabetes: Secondary | ICD-10-CM

## 2018-04-12 MED ORDER — CYCLOBENZAPRINE HCL 10 MG PO TABS: ORAL_TABLET | ORAL | 1 refills | Status: DC

## 2018-04-12 MED ORDER — PHENTERMINE HCL 37.5 MG PO TABS: 37.5000 mg | ORAL_TABLET | Freq: Every day | ORAL | 1 refills | Status: DC

## 2018-04-12 NOTE — Progress Notes (Signed)
Julie Ryan     MRN: [id number]      DOB: 12-Apr-1966   HPI [name] is here for follow up and re-evaluation of chronic medical conditions, medication management and review of any available recent lab and radiology data.  Preventive health is updated, specifically  Cancer screening and Immunization.   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 Denies sinus pressure, nasal congestion, ear pain or sore throat. Denies chest congestion, productive cough or wheezing. Denies chest pains, palpitations and leg swelling Denies abdominal pain, nausea, vomiting,diarrhea or constipation.   Denies dysuria, frequency, hesitancy or incontinence.  Denies headaches, seizures, numbness, or tingling.  Denies skin break down or rash.   PE  BP 118/70 (BP Location: Right Arm, Patient Position: Sitting, Cuff Size: Large)   Pulse 79   Resp 12   Ht 5\' 6"  (1.676 m)   Wt 270 lb (122.5 kg)   SpO2 99% Comment: room air  BMI 43.58 kg/m   Patient alert and oriented and in no cardiopulmonary distress.  HEENT: No facial asymmetry, EOMI,   oropharynx pink and moist.  Neck supple no JVD, no mass.  Chest: Clear to auscultation bilaterally.  CVS: S1, S2 no murmurs, no S3.Regular rate.  ABD: Soft non tender.   Ext: No edema  MS: Decreased  ROM lumbar  spine, shoulders, hips and knees.  Skin: Intact, no ulcerations or rash noted.  Psych: Good eye contact, normal affect. Memory intact mildly anxious and  depressed appearing.  CNS: CN 2-12 intact, power,  normal throughout.no focal deficits noted.   Assessment & Plan  Depression,  major, single episode, severe ([place]) Refer to Psychiatry asap, has intolerance recorded to 2 anti depressants, will let Psychiatry prescribe  Essential hypertension Controlled, no change in medication DASH diet and commitment to daily physical activity for a minimum of 30 minutes discussed and encouraged, as a part of hypertension management. The importance of attaining a healthy weight is also discussed.  BP/Weight 04/12/2018 12/12/2017 11/17/2017 10/27/2017 09/20/2017 4/68/0321 08/07/4823  Systolic BP 003 704 888 916 945 038 882  Diastolic BP 70 84 70 68 78 63 91  Wt. (Lbs) 270 257 258 261 262 256 256  BMI 43.58 41.48 41.64 42.13 42.29 38.92 38.92       MORBID OBESITY Deteriorated. Patient re-educated about  the importance of commitment to a  minimum of 150 minutes of exercise per week.  The importance of healthy food choices with portion control discussed. Encouraged to start a food diary, count calories and to consider  joining a support group. Sample diet sheets offered. Goals set by the patient for the next several months.   Weight /BMI 04/12/2018 12/12/2017 11/17/2017  WEIGHT 270 lb 257 lb 258 lb  HEIGHT 5\' 6"  5\' 6"  5\' 6"   BMI 43.58 kg/m2 41.48 kg/m2 41.64 kg/m2      Snoring recent sleep study done in 2018 is reviewed , she does not have sleep apnea  Back spasm Increased low back spasm in past 3 months, will use flexeril at night as needed  Need for immunization against influenza After obtaining informed consent,  the vaccine is  administered by LPN.

## 2018-04-12 NOTE — Patient Instructions (Addendum)
F/U in 4 month, call if you need me before  Flu vaccine today  Start half phentermine daily to help curb appetite and we will give you info re bariatric surgery , which you pursue independently  Fasting lipid, cmp , magnesium,and EGFr, HBA1C today  OTC magnesium  400 mg 1 daily helps spasms  You are referred to Psychiatry, you DO NEED this and will benefit greatly  It is important that you exercise regularly at least 30 minutes 5 times a week. If you develop chest pain, have severe difficulty breathing, or feel very tired, stop exercising immediately and seek medical attention  We will get sleep study report from Dr Merlene Laughter if possible and get in touch with you  Flexeril for bedtime use if needed for back spasm

## 2018-04-17 ENCOUNTER — Ambulatory Visit (HOSPITAL_COMMUNITY)
Admission: RE | Admit: 2018-04-17 | Discharge: 2018-04-17 | Disposition: A | Discharge status: Home / Self Care | Payer: BC Managed Care – PPO | Source: Ambulatory Visit | Attending: Family Medicine | Admitting: Family Medicine

## 2018-04-17 DIAGNOSIS — Z1231 Encounter for screening mammogram for malignant neoplasm of breast: Secondary | ICD-10-CM | POA: Diagnosis not present

## 2018-04-24 ENCOUNTER — Encounter: Payer: Self-pay | Admitting: Family Medicine

## 2018-05-03 ENCOUNTER — Other Ambulatory Visit: Payer: Self-pay | Admitting: Family Medicine

## 2018-05-03 DIAGNOSIS — M79641 Pain in right hand: Secondary | ICD-10-CM

## 2018-05-07 ENCOUNTER — Encounter: Payer: Self-pay | Admitting: Family Medicine

## 2018-05-07 DIAGNOSIS — F322 Major depressive disorder, single episode, severe without psychotic features: Secondary | ICD-10-CM | POA: Insufficient documentation

## 2018-05-07 NOTE — Assessment & Plan Note (Signed)
After obtaining informed consent, the vaccine is  administered by LPN.  

## 2018-05-07 NOTE — Assessment & Plan Note (Addendum)
recent sleep study done in 2018 is reviewed , she does not have sleep apnea

## 2018-05-07 NOTE — Assessment & Plan Note (Signed)
Controlled, no change in medication DASH diet and commitment to daily physical activity for a minimum of 30 minutes discussed and encouraged, as a part of hypertension management. The importance of attaining a healthy weight is also discussed.  BP/Weight 04/12/2018 12/12/2017 11/17/2017 10/27/2017 09/20/2017 5/45/6256 09/10/9371  Systolic BP 428 768 115 726 203 559 741  Diastolic BP 70 84 70 68 78 63 91  Wt. (Lbs) 270 257 258 261 262 256 256  BMI 43.58 41.48 41.64 42.13 42.29 38.92 38.92

## 2018-05-07 NOTE — Assessment & Plan Note (Signed)
Increased low back spasm in past 3 months, will use flexeril at night as needed

## 2018-05-07 NOTE — Assessment & Plan Note (Addendum)
Refer to Psychiatry asap, has intolerance recorded to 2 anti depressants, will let Psychiatry prescribe

## 2018-05-07 NOTE — Assessment & Plan Note (Signed)
Deteriorated. Patient re-educated about  the importance of commitment to a  minimum of 150 minutes of exercise per week.  The importance of healthy food choices with portion control discussed. Encouraged to start a food diary, count calories and to consider  joining a support group. Sample diet sheets offered. Goals set by the patient for the next several months.   Weight /BMI 04/12/2018 12/12/2017 11/17/2017  WEIGHT 270 lb 257 lb 258 lb  HEIGHT 5\' 6"  5\' 6"  5\' 6"   BMI 43.58 kg/m2 41.48 kg/m2 41.64 kg/m2
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
## 2018-05-11 ENCOUNTER — Encounter (HOSPITAL_COMMUNITY): Payer: Self-pay | Admitting: Psychiatry

## 2018-05-11 ENCOUNTER — Ambulatory Visit (INDEPENDENT_AMBULATORY_CARE_PROVIDER_SITE_OTHER): Payer: BC Managed Care – PPO | Admitting: Psychiatry

## 2018-05-11 VITALS — BP 135/79 | HR 87 | Ht 66.0 in | Wt 267.0 lb

## 2018-05-11 DIAGNOSIS — F331 Major depressive disorder, recurrent, moderate: Secondary | ICD-10-CM

## 2018-05-11 MED ORDER — SERTRALINE HCL 50 MG PO TABS: ORAL_TABLET | ORAL | 0 refills | Status: DC

## 2018-05-11 NOTE — Patient Instructions (Addendum)
1. Start sertraline 25 mg daily for one week, then 50 mg daily  2. Referral to [facility] for therapy 3. Return to clinic in one month for 30 mins

## 2018-05-12 ENCOUNTER — Telehealth: Payer: Self-pay | Admitting: Family Medicine

## 2018-05-12 NOTE — Telephone Encounter (Signed)
Please contact pt, telephone or letter if unable to get her, let her know that I did receive the result of her sleep apnea and she does NOT have sleep apnea

## 2018-05-12 NOTE — Telephone Encounter (Signed)
Letter mailed to patient making her aware

## 2018-05-25 ENCOUNTER — Telehealth: Payer: Self-pay | Admitting: Family Medicine

## 2018-05-25 NOTE — Telephone Encounter (Signed)
I have tried 3 times to call this patient back to get her symptoms but the phone is ringing 3 times and the operator comes on saying call cannot be completed at this time. Need to know what symptoms she's having before I send the request to the Dr (if she calls back)

## 2018-05-25 NOTE — Telephone Encounter (Signed)
Pt is calling in wants to know if she can get a refill on Benzonatate--

## 2018-06-03 ENCOUNTER — Other Ambulatory Visit: Payer: Self-pay | Admitting: Family Medicine

## 2018-06-05 MED ORDER — FAMOTIDINE 40 MG PO TABS: 40.0000 mg | ORAL_TABLET | Freq: Every day | ORAL | 1 refills | Status: DC

## 2018-06-05 NOTE — Telephone Encounter (Signed)
See pharmacy note

## 2018-06-05 NOTE — Telephone Encounter (Signed)
Please discontinue the med new is prepcid 40 mg once daily, pls send

## 2018-06-05 NOTE — Addendum Note (Signed)
Addended by: Eual Fines on: 06/05/2018 03:35 PM   Modules accepted: Orders
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
## 2018-06-13 ENCOUNTER — Ambulatory Visit (HOSPITAL_COMMUNITY): Payer: BC Managed Care – PPO | Admitting: Psychiatry

## 2018-08-11 ENCOUNTER — Other Ambulatory Visit: Payer: Self-pay | Admitting: Family Medicine

## 2018-08-14 ENCOUNTER — Ambulatory Visit (INDEPENDENT_AMBULATORY_CARE_PROVIDER_SITE_OTHER): Payer: BC Managed Care – PPO | Admitting: Family Medicine

## 2018-08-14 ENCOUNTER — Encounter: Payer: Self-pay | Admitting: Family Medicine

## 2018-08-14 VITALS — BP 130/90 | HR 73 | Resp 15 | Ht 66.0 in | Wt 268.0 lb

## 2018-08-14 DIAGNOSIS — K219 Gastro-esophageal reflux disease without esophagitis: Secondary | ICD-10-CM

## 2018-08-14 DIAGNOSIS — I10 Essential (primary) hypertension: Secondary | ICD-10-CM | POA: Diagnosis not present

## 2018-08-14 DIAGNOSIS — F17210 Nicotine dependence, cigarettes, uncomplicated: Secondary | ICD-10-CM | POA: Insufficient documentation

## 2018-08-14 DIAGNOSIS — R42 Dizziness and giddiness: Secondary | ICD-10-CM

## 2018-08-14 DIAGNOSIS — R7303 Prediabetes: Secondary | ICD-10-CM | POA: Diagnosis not present

## 2018-08-14 DIAGNOSIS — F17200 Nicotine dependence, unspecified, uncomplicated: Secondary | ICD-10-CM

## 2018-08-14 MED ORDER — PANTOPRAZOLE SODIUM 40 MG PO TBEC: 40.0000 mg | DELAYED_RELEASE_TABLET | Freq: Every day | ORAL | 1 refills | Status: DC

## 2018-08-14 MED ORDER — PHENTERMINE HCL 37.5 MG PO TABS: 37.5000 mg | ORAL_TABLET | Freq: Every day | ORAL | 1 refills | Status: DC

## 2018-08-14 NOTE — Assessment & Plan Note (Signed)
Patient educated about the importance of limiting  Carbohydrate intake , the need to commit to daily physical activity for a minimum of 30 minutes , and to commit weight loss. The fact that changes in all these areas will reduce or eliminate all together the development of diabetes is stressed.   Diabetic Labs Latest Ref Rng & Units 09/07/2017 06/21/2017 12/02/2016 06/01/2016 12/16/2015  HbA1c 4.8 - 5.6 % 5.7(H) 6.1(H) 5.9(H) 5.7(H) 5.9(H)  Chol <200 mg/dL - 142 - 163 -  HDL >50 mg/dL - 55 - 61 -  Calc LDL mg/dL (calc) - 71 - 85 -  Triglycerides <150 mg/dL - 75 - 83 -  Creatinine 0.44 - 1.00 mg/dL 0.81 0.82 0.92 0.99 -   BP/Weight 08/14/2018 04/12/2018 12/12/2017 11/17/2017 10/27/2017 09/20/2017 07/16/1622  Systolic BP 469 507 225 750 518 335 825  Diastolic BP 90 70 84 70 68 78 63  Wt. (Lbs) 268 270 257 258 261 262 256  BMI 43.26 43.58 41.48 41.64 42.13 42.29 38.92  Some encounter information is confidential and restricted. Go to Review Flowsheets activity to see all data.   Foot/eye exam completion dates Latest Ref Rng & Units 12/09/2016  Eye Exam No Retinopathy No Retinopathy  Foot Form Completion - -

## 2018-08-14 NOTE — Assessment & Plan Note (Signed)
Symptomatic for over 6 months, especially with change in position, refer for carotid doppler. Low fat , low sugar diet is stressed also

## 2018-08-14 NOTE — Assessment & Plan Note (Signed)
Unchanged, start half phentermine daily , also considering bariatric surgery  Patient re-educated about  the importance of commitment to a  minimum of 150 minutes of exercise per week as able.  The importance of healthy food choices with portion control discussed, as well as eating regularly and within a 12 hour window most days. The need to choose "clean , green" food 50 to 75% of the time is discussed, as well as to make water the primary drink and set a goal of 64 ounces water daily.  Encouraged to start a food diary,  and to consider  joining a support group. Sample diet sheets offered. Goals set by the patient for the next several months.   Weight /BMI 08/14/2018 04/12/2018 12/12/2017  WEIGHT 268 lb 270 lb 257 lb  HEIGHT 5\' 6"  5\' 6"  5\' 6"   BMI 43.26 kg/m2 43.58 kg/m2 41.48 kg/m2  Some encounter information is confidential and restricted. Go to Review Flowsheets activity to see all data.

## 2018-08-14 NOTE — Progress Notes (Signed)
Julie Ryan     MRN: [id number]      DOB: Jan 20, 1966   HPI Julie Ryan is here for follow up and re-evaluation of chronic medical conditions, medication management and review of any available recent lab and radiology data.  Preventive health is updated, specifically  Cancer screening and Immunization.   Questions or concerns regarding consultations or procedures which the PT has had in the interim are  addressed. The PT denies any adverse reactions to current medications since the last visit.  C/o light headedness especially when bending over then raising her head for months No regular exercise, has not been taking the phentermine and is considering bariatric surgery  ROS Denies recent fever or chills. Denies sinus pressure, nasal congestion, ear pain or sore throat. Denies chest congestion, productive cough or wheezing. Denies chest pains, palpitations and leg swelling Denies abdominal pain, nausea, vomiting,diarrhea or constipation.   Denies dysuria, frequency, hesitancy or incontinence. Denies joint pain, swelling and limitation in mobility. Denies headaches, seizures, numbness, or tingling. Denies depression, anxiety or insomnia. Denies skin break down or rash.   PE  BP 130/90   Pulse 73   Resp 15   Ht 5\' 6"  (1.676 m)   Wt 268 lb (121.6 kg)   SpO2 96%   BMI 43.26 kg/m   Patient alert and oriented and in no cardiopulmonary distress.  HEENT: No facial asymmetry, EOMI,   oropharynx pink and moist.  Neck supple no JVD, no mass.  Chest: Clear to auscultation bilaterally.  CVS: S1, S2 no murmurs, no S3.Regular rate.  ABD: Soft mild epigastric tenderness Ext: No edema  MS: Adequate ROM spine, shoulders, hips and knees.  Skin: Intact, no ulcerations or rash noted.  Psych: Good eye contact, normal affect. Memory intact not anxious or depressed appearing.  CNS: CN 2-12 intact, power,  normal throughout.no focal deficits noted.   Assessment & Plan  Essential  hypertension Uncontrolled, needs to reduce salt intake and work on weight loss DASH diet and commitment to daily physical activity for a minimum of 30 minutes discussed and encouraged, as a part of hypertension management. The importance of attaining a healthy weight is also discussed.  BP/Weight 08/14/2018 04/12/2018 12/12/2017 11/17/2017 10/27/2017 09/20/2017 9/79/8921  Systolic BP 194 174 081 448 185 631 497  Diastolic BP 90 70 84 70 68 78 63  Wt. (Lbs) 268 270 257 258 261 262 256  BMI 43.26 43.58 41.48 41.64 42.13 42.29 38.92  Some encounter information is confidential and restricted. Go to Review Flowsheets activity to see all data.       MORBID OBESITY Unchanged, start half phentermine daily , also considering bariatric surgery  Patient re-educated about  the importance of commitment to a  minimum of 150 minutes of exercise per week as able.  The importance of healthy food choices with portion control discussed, as well as eating regularly and within a 12 hour window most days. The need to choose "clean , green" food 50 to 75% of the time is discussed, as well as to make water the primary drink and set a goal of 64 ounces water daily.  Encouraged to start a food diary,  and to consider  joining a support group. Sample diet sheets offered. Goals set by the patient for the next several months.   Weight /BMI 08/14/2018 04/12/2018 12/12/2017  WEIGHT 268 lb 270 lb 257 lb  HEIGHT 5\' 6"  5\' 6"  5\' 6"   BMI 43.26 kg/m2 43.58 kg/m2 41.48 kg/m2  Some encounter information is confidential and restricted. Go to Review Flowsheets activity to see all data.      Prediabetes Patient educated about the importance of limiting  Carbohydrate intake , the need to commit to daily physical activity for a minimum of 30 minutes , and to commit weight loss. The fact that changes in all these areas will reduce or eliminate all together the development of diabetes is stressed.   Diabetic Labs Latest Ref Rng &  Units 09/07/2017 06/21/2017 12/02/2016 06/01/2016 12/16/2015  HbA1c 4.8 - 5.6 % 5.7(H) 6.1(H) 5.9(H) 5.7(H) 5.9(H)  Chol <200 mg/dL - 142 - 163 -  HDL >50 mg/dL - 55 - 61 -  Calc LDL mg/dL (calc) - 71 - 85 -  Triglycerides <150 mg/dL - 75 - 83 -  Creatinine 0.44 - 1.00 mg/dL 0.81 0.82 0.92 0.99 -   BP/Weight 08/14/2018 04/12/2018 12/12/2017 11/17/2017 10/27/2017 09/20/2017 9/73/5329  Systolic BP 924 268 341 962 229 798 921  Diastolic BP 90 70 84 70 68 78 63  Wt. (Lbs) 268 270 257 258 261 262 256  BMI 43.26 43.58 41.48 41.64 42.13 42.29 38.92  Some encounter information is confidential and restricted. Go to Review Flowsheets activity to see all data.   Foot/eye exam completion dates Latest Ref Rng & Units 12/09/2016  Eye Exam No Retinopathy No Retinopathy  Foot Form Completion - -      Light-headedness Symptomatic for over 6 months, especially with change in position, refer for carotid doppler. Low fat , low sugar diet is stressed also  Cigarette smoker motivated to quit Asked:confirms currently smokes cigarettes on average 5 to 7 / week, driven by stress and also another smoker in the home Assess: willing to quit but no quit date set Advise: needs to QUIT to reduce risk of cancer, cardio and cerebrovascular disease Assist: counseled for 5 minutes and literature provided Arrange: follow up in 3 months   GERD (gastroesophageal reflux disease) C/o increased epigastric burning pain, does drinbk coffee, will change to decaf and work on weight loss, start protonix also

## 2018-08-14 NOTE — Assessment & Plan Note (Signed)
C/o increased epigastric burning pain, does drinbk coffee, will change to decaf and work on weight loss, start protonix also

## 2018-08-14 NOTE — Assessment & Plan Note (Signed)
Uncontrolled, needs to reduce salt intake and work on weight loss DASH diet and commitment to daily physical activity for a minimum of 30 minutes discussed and encouraged, as a part of hypertension management. The importance of attaining a healthy weight is also discussed.  BP/Weight 08/14/2018 04/12/2018 12/12/2017 11/17/2017 10/27/2017 09/20/2017 1/63/8466  Systolic BP 599 357 017 793 903 009 233  Diastolic BP 90 70 84 70 68 78 63  Wt. (Lbs) 268 270 257 258 261 262 256  BMI 43.26 43.58 41.48 41.64 42.13 42.29 38.92  Some encounter information is confidential and restricted. Go to Review Flowsheets activity to see all data.

## 2018-08-14 NOTE — Assessment & Plan Note (Addendum)
Asked:confirms currently smokes cigarettes on average 5 to 7 / week, driven by stress and also another smoker in the home Assess: willing to quit but no quit date set Advise: needs to QUIT to reduce risk of cancer, cardio and cerebrovascular disease Assist: counseled for 5 minutes and literature provided Arrange: follow up in 3 months

## 2018-08-14 NOTE — Patient Instructions (Signed)
F/U in 4 months, call if you need me sooner  Reconsider the flu vaccine and shingles vaccines  Carotid doppler to be scheduled for light headedness   It is important that you exercise regularly at least 30 minutes 5 times a week. If you develop chest pain, have severe difficulty breathing, or feel very tired, stop exercising immediately and seek medical attention   Labs today and sheet will be given to you   Bariatric surgery  Can be life changing in a POSITIVE way for people challenged by weight   Start Protonix for reflux, stop caffeine  Thank you  for choosing [facility]. We consider it a privelige to serve you.  Delivering excellent health care in a caring and  compassionate way is our goal.  Partnering with you,  so that together we can achieve this goal is our strategy.

## 2018-08-15 LAB — COMPLETE METABOLIC PANEL WITH GFR
AG RATIO: 1.1 (calc) (ref 1.0–2.5)
ALBUMIN MSPROF: 3.8 g/dL (ref 3.6–5.1)
ALT: 11 U/L (ref 6–29)
AST: 15 U/L (ref 10–35)
Alkaline phosphatase (APISO): 104 U/L (ref 37–153)
BUN: 14 mg/dL (ref 7–25)
CALCIUM: 10 mg/dL (ref 8.6–10.4)
CO2: 28 mmol/L (ref 20–32)
CREATININE: 0.92 mg/dL (ref 0.50–1.05)
Chloride: 99 mmol/L (ref 98–110)
GFR, EST AFRICAN AMERICAN: 82 mL/min/{1.73_m2} (ref >=60)
GFR, EST NON AFRICAN AMERICAN: 71 mL/min/{1.73_m2} (ref >=60)
GLOBULIN: 3.5 g/dL (ref 1.9–3.7)
Glucose, Bld: 87 mg/dL (ref 65–139)
POTASSIUM: 4.5 mmol/L (ref 3.5–5.3)
SODIUM: 137 mmol/L (ref 135–146)
Total Bilirubin: 0.3 mg/dL (ref 0.2–1.2)
Total Protein: 7.3 g/dL (ref 6.1–8.1)

## 2018-08-15 LAB — LIPID PANEL
CHOL/HDL RATIO: 4 (calc) (ref <5.0)
CHOLESTEROL: 197 mg/dL (ref <200)
HDL: 49 mg/dL — AB (ref >=50)
LDL Cholesterol (Calc): 123 mg/dL (calc) — ABNORMAL HIGH
Non-HDL Cholesterol (Calc): 148 mg/dL (calc) — ABNORMAL HIGH (ref <130)
Triglycerides: 142 mg/dL (ref <150)

## 2018-08-15 LAB — HEMOGLOBIN A1C
HEMOGLOBIN A1C: 6.2 %{Hb} — AB (ref <5.7)
Mean Plasma Glucose: 131 (calc)
eAG (mmol/L): 7.3 (calc)

## 2018-08-15 LAB — MAGNESIUM: Magnesium: 1.8 mg/dL (ref 1.5–2.5)

## 2018-09-07 ENCOUNTER — Other Ambulatory Visit: Payer: Self-pay | Admitting: Family Medicine

## 2018-10-04 ENCOUNTER — Other Ambulatory Visit: Payer: Self-pay | Admitting: Family Medicine

## 2018-10-18 ENCOUNTER — Other Ambulatory Visit: Payer: Self-pay | Admitting: Family Medicine

## 2018-10-28 ENCOUNTER — Other Ambulatory Visit: Payer: Self-pay | Admitting: Family Medicine

## 2018-11-16 ENCOUNTER — Other Ambulatory Visit: Payer: Self-pay | Admitting: Adult Health

## 2018-12-05 ENCOUNTER — Other Ambulatory Visit: Payer: Self-pay | Admitting: Family Medicine

## 2018-12-05 NOTE — Telephone Encounter (Signed)
Requesting refill

## 2018-12-11 ENCOUNTER — Telehealth: Payer: Self-pay

## 2018-12-14 ENCOUNTER — Ambulatory Visit (INDEPENDENT_AMBULATORY_CARE_PROVIDER_SITE_OTHER): Payer: BC Managed Care – PPO | Admitting: Family Medicine

## 2018-12-14 ENCOUNTER — Encounter: Payer: Self-pay | Admitting: Family Medicine

## 2018-12-14 ENCOUNTER — Other Ambulatory Visit: Payer: Self-pay

## 2018-12-14 VITALS — BP 130/90 | Ht 66.0 in | Wt 280.0 lb

## 2018-12-14 DIAGNOSIS — Z1231 Encounter for screening mammogram for malignant neoplasm of breast: Secondary | ICD-10-CM | POA: Diagnosis not present

## 2018-12-14 DIAGNOSIS — R42 Dizziness and giddiness: Secondary | ICD-10-CM

## 2018-12-14 DIAGNOSIS — I10 Essential (primary) hypertension: Secondary | ICD-10-CM

## 2018-12-14 DIAGNOSIS — R7303 Prediabetes: Secondary | ICD-10-CM

## 2018-12-14 DIAGNOSIS — E785 Hyperlipidemia, unspecified: Secondary | ICD-10-CM

## 2018-12-14 MED ORDER — MECLIZINE HCL 25 MG PO TABS: 25.0000 mg | ORAL_TABLET | Freq: Three times a day (TID) | ORAL | 0 refills | Status: DC | PRN

## 2018-12-14 MED ORDER — POTASSIUM CHLORIDE CRYS ER 20 MEQ PO TBCR: EXTENDED_RELEASE_TABLET | ORAL | 0 refills | Status: DC

## 2018-12-14 MED ORDER — FUROSEMIDE 20 MG PO TABS: ORAL_TABLET | ORAL | 0 refills | Status: DC

## 2018-12-14 NOTE — Assessment & Plan Note (Signed)
5 month history, new since January, very severe episode earlier this week, when held down head , seemed as though the whole room was spinning, also aggravated when she turns on her side, has used no medication. Will refer urgently to PT and also send  Some meclizine , however , needs therapy

## 2018-12-14 NOTE — Patient Instructions (Signed)
Annual physical exam with MD, no pap, end October, call if you need me sooner.   Mammogram scheduled , please keep appointment  You are referred  urgently to physical therapy for vertigo, and medication also sent. We will call with your appointment   All the best with bariatric surgery, this will lead to improved health, remember food choice and regular exercise are THE MAIN CHANGeS you need to make and stick to!  Social distancing. Frequent hand washing with soap and water Keeping your hands off of your face. These 3 practices will help to keep both you and your community healthy during this time. Please practice them faithfully! Thanks for choosing [facility], we consider it a privelige to serve you.

## 2018-12-14 NOTE — Assessment & Plan Note (Addendum)
Deteriorated.Plans bariatric surgery.  Patient re-educated about  the importance of commitment to a  minimum of 150 minutes of exercise per week as able.  The importance of healthy food choices with portion control discussed, as well as eating regularly and within a 12 hour window most days. The need to choose "clean , green" food 50 to 75% of the time is discussed, as well as to make water the primary drink and set a goal of 64 ounces water daily.  Preparing for bariatric surgery in next several months   Weight /BMI 12/14/2018 08/14/2018 04/12/2018  WEIGHT 280 lb 268 lb 270 lb  HEIGHT 5\' 6"  5\' 6"  5\' 6"   BMI 45.19 kg/m2 43.26 kg/m2 43.58 kg/m2  Some encounter information is confidential and restricted. Go to Review Flowsheets activity to see all data.

## 2018-12-14 NOTE — Progress Notes (Signed)
Virtual Visit via Telephone Note  I connected with Julie Ryan on 12/14/18 at 11:00 AM EDT by telephone and verified that I am speaking with the correct person using two identifiers.  Location: Patient:work Provider: office   I discussed the limitations, risks, security and privacy concerns of performing an evaluation and management service by telephone and the availability of in person appointments. I also discussed with the patient that there may be a patient responsible charge related to this service. The patient expressed understanding and agreed to proceed.   History of Present Illness: Dizzy x 5 monhts, aggravated by movement of her head, severe episode 2 days ago when she repositioned her head while lying down. Denies recent fever , chills or URI symptoms Proceeding with bariaitric surgery , phentermine of no benefit, and her weight is the most it has ever been Denies recent fever or chills. Denies sinus pressure, nasal congestion, ear pain or sore throat. Denies chest congestion, productive cough or wheezing. Denies chest pains, palpitations and leg swelling Denies abdominal pain, nausea, vomiting,diarrhea or constipation.   Denies dysuria, frequency, hesitancy or incontinence. Denies  uncontrolled joint pain, swelling and limitation in mobility. Denies headaches, seizures, numbness, or tingling. Denies depression, anxiety or insomnia. Denies skin break down or rash.       Observations/Objective: BP 130/90   Ht 5\' 6"  (1.676 m)   Wt 280 lb (127 kg)   BMI 45.19 kg/m  Good communication with no confusion and intact memory. Alert and oriented x 3 No signs of respiratory distress during sppech    Assessment and Plan:  Vertigo 5 month history, new since January, very severe episode earlier this week, when held down head , seemed as though the whole room was spinning, also aggravated when she turns on her side, has used no medication. Will refer urgently to PT and also  send  Some meclizine , however , needs therapy  MORBID OBESITY Deteriorated.Plans bariatric surgery.  Patient re-educated about  the importance of commitment to a  minimum of 150 minutes of exercise per week as able.  The importance of healthy food choices with portion control discussed, as well as eating regularly and within a 12 hour window most days. The need to choose "clean , green" food 50 to 75% of the time is discussed, as well as to make water the primary drink and set a goal of 64 ounces water daily.  Preparing for bariatric surgery in next several months   Weight /BMI 12/14/2018 08/14/2018 04/12/2018  WEIGHT 280 lb 268 lb 270 lb  HEIGHT 5\' 6"  5\' 6"  5\' 6"   BMI 45.19 kg/m2 43.26 kg/m2 43.58 kg/m2  Some encounter information is confidential and restricted. Go to Review Flowsheets activity to see all data.      Essential hypertension Controlled, no change in medication DASH diet and commitment to daily physical activity for a minimum of 30 minutes discussed and encouraged, as a part of hypertension management. The importance of attaining a healthy weight is also discussed.  BP/Weight 12/14/2018 08/14/2018 04/12/2018 12/12/2017 11/17/2017 10/27/2017 1/82/9937  Systolic BP 169 678 938 101 751 025 852  Diastolic BP 90 90 70 84 70 68 78  Wt. (Lbs) 280 268 270 257 258 261 262  BMI 45.19 43.26 43.58 41.48 41.64 42.13 42.29  Some encounter information is confidential and restricted. Go to Review Flowsheets activity to see all data.       Hyperlipidemia LDL goal <100 Hyperlipidemia:Low fat diet discussed and encouraged.  Lipid Panel  Lab Results  Component Value Date   CHOL 197 08/14/2018   HDL 49 (L) 08/14/2018   LDLCALC 123 (H) 08/14/2018   TRIG 142 08/14/2018   CHOLHDL 4.0 08/14/2018       Prediabetes Patient educated about the importance of limiting  Carbohydrate intake , the need to commit to daily physical activity for a minimum of 30 minutes , and to commit weight  loss. The fact that changes in all these areas will reduce or eliminate all together the development of diabetes is stressed.   Diabetic Labs Latest Ref Rng & Units 08/14/2018 09/07/2017 06/21/2017 12/02/2016 06/01/2016  HbA1c <5.7 % of total Hgb 6.2(H) 5.7(H) 6.1(H) 5.9(H) 5.7(H)  Chol <200 mg/dL 197 - 142 - 163  HDL > OR = 50 mg/dL 49(L) - 55 - 61  Calc LDL mg/dL (calc) 123(H) - 71 - 85  Triglycerides <150 mg/dL 142 - 75 - 83  Creatinine 0.50 - 1.05 mg/dL 0.92 0.81 0.82 0.92 0.99   BP/Weight 12/14/2018 08/14/2018 04/12/2018 12/12/2017 11/17/2017 10/27/2017 4/66/5993  Systolic BP 570 177 939 030 092 330 076  Diastolic BP 90 90 70 84 70 68 78  Wt. (Lbs) 280 268 270 257 258 261 262  BMI 45.19 43.26 43.58 41.48 41.64 42.13 42.29  Some encounter information is confidential and restricted. Go to Review Flowsheets activity to see all data.   Foot/eye exam completion dates Latest Ref Rng & Units 12/09/2016  Eye Exam No Retinopathy No Retinopathy  Foot Form Completion - -       Follow Up Instructions:    I discussed the assessment and treatment plan with the patient. The patient was provided an opportunity to ask questions and all were answered. The patient agreed with the plan and demonstrated an understanding of the instructions.   The patient was advised to call back or seek an in-person evaluation if the symptoms worsen or if the condition fails to improve as anticipated.  I provided 25 minutes of non-face-to-face time during this encounter.   Tula Nakayama, MD

## 2018-12-15 ENCOUNTER — Encounter: Payer: Self-pay | Admitting: Family Medicine

## 2018-12-15 NOTE — Assessment & Plan Note (Signed)
Hyperlipidemia:Low fat diet discussed and encouraged.   Lipid Panel  Lab Results  Component Value Date   CHOL 197 08/14/2018   HDL 49 (L) 08/14/2018   LDLCALC 123 (H) 08/14/2018   TRIG 142 08/14/2018   CHOLHDL 4.0 08/14/2018

## 2018-12-15 NOTE — Assessment & Plan Note (Signed)
Patient educated about the importance of limiting  Carbohydrate intake , the need to commit to daily physical activity for a minimum of 30 minutes , and to commit weight loss. The fact that changes in all these areas will reduce or eliminate all together the development of diabetes is stressed.   Diabetic Labs Latest Ref Rng & Units 08/14/2018 09/07/2017 06/21/2017 12/02/2016 06/01/2016  HbA1c <5.7 % of total Hgb 6.2(H) 5.7(H) 6.1(H) 5.9(H) 5.7(H)  Chol <200 mg/dL 197 - 142 - 163  HDL > OR = 50 mg/dL 49(L) - 55 - 61  Calc LDL mg/dL (calc) 123(H) - 71 - 85  Triglycerides <150 mg/dL 142 - 75 - 83  Creatinine 0.50 - 1.05 mg/dL 0.92 0.81 0.82 0.92 0.99   BP/Weight 12/14/2018 08/14/2018 04/12/2018 12/12/2017 11/17/2017 10/27/2017 11/30/4130  Systolic BP 440 102 725 366 440 347 425  Diastolic BP 90 90 70 84 70 68 78  Wt. (Lbs) 280 268 270 257 258 261 262  BMI 45.19 43.26 43.58 41.48 41.64 42.13 42.29  Some encounter information is confidential and restricted. Go to Review Flowsheets activity to see all data.   Foot/eye exam completion dates Latest Ref Rng & Units 12/09/2016  Eye Exam No Retinopathy No Retinopathy  Foot Form Completion - -

## 2018-12-15 NOTE — Assessment & Plan Note (Signed)
Controlled, no change in medication DASH diet and commitment to daily physical activity for a minimum of 30 minutes discussed and encouraged, as a part of hypertension management. The importance of attaining a healthy weight is also discussed.  BP/Weight 12/14/2018 08/14/2018 04/12/2018 12/12/2017 11/17/2017 10/27/2017 1/46/0479  Systolic BP 987 215 872 761 848 592 763  Diastolic BP 90 90 70 84 70 68 78  Wt. (Lbs) 280 268 270 257 258 261 262  BMI 45.19 43.26 43.58 41.48 41.64 42.13 42.29  Some encounter information is confidential and restricted. Go to Review Flowsheets activity to see all data.

## 2018-12-21 ENCOUNTER — Encounter: Payer: Self-pay | Admitting: Family Medicine

## 2018-12-21 ENCOUNTER — Other Ambulatory Visit: Payer: Self-pay | Admitting: Family Medicine

## 2019-01-05 ENCOUNTER — Encounter: Payer: Self-pay | Admitting: Family Medicine

## 2019-01-08 ENCOUNTER — Other Ambulatory Visit: Payer: Self-pay

## 2019-01-08 ENCOUNTER — Other Ambulatory Visit: Payer: Self-pay | Admitting: Internal Medicine

## 2019-01-08 DIAGNOSIS — Z20822 Contact with and (suspected) exposure to covid-19: Secondary | ICD-10-CM

## 2019-01-12 ENCOUNTER — Encounter: Payer: Self-pay | Admitting: Family Medicine

## 2019-01-13 LAB — NOVEL CORONAVIRUS, NAA: SARS-CoV-2, NAA: DETECTED — AB

## 2019-01-14 ENCOUNTER — Encounter: Payer: Self-pay | Admitting: Family Medicine

## 2019-01-15 ENCOUNTER — Telehealth: Payer: Self-pay | Admitting: Family Medicine

## 2019-01-15 ENCOUNTER — Encounter: Payer: Self-pay | Admitting: Family Medicine

## 2019-01-15 NOTE — Telephone Encounter (Signed)
noted 

## 2019-01-15 NOTE — Telephone Encounter (Signed)
Message left o n telephone today for pt to call after 8 am, I have tried unsuccessfully twice through hospital operator and sent a pt message also , no contact yet. Pt is positivefor Covid 19, needs to self isolate , go to eD for difficulty breathing , or deterioration in health, tylenol, fluids and rest Health dept is aware and should be I touch re contact tracing as it relates to public health  Close contacts need testing Pls explain and let her know, try calling if she does not call in before lunch please

## 2019-01-15 NOTE — Telephone Encounter (Signed)
Positive Covid

## 2019-01-17 ENCOUNTER — Other Ambulatory Visit: Payer: Self-pay | Admitting: Family Medicine

## 2019-01-17 DIAGNOSIS — M79641 Pain in right hand: Secondary | ICD-10-CM

## 2019-01-18 ENCOUNTER — Other Ambulatory Visit: Payer: Self-pay | Admitting: Family Medicine

## 2019-01-18 DIAGNOSIS — U071 COVID-19: Secondary | ICD-10-CM

## 2019-01-18 MED ORDER — ALBUTEROL SULFATE HFA 108 (90 BASE) MCG/ACT IN AERS: 2.0000 | INHALATION_SPRAY | Freq: Four times a day (QID) | RESPIRATORY_TRACT | 1 refills | Status: DC | PRN

## 2019-01-20 ENCOUNTER — Other Ambulatory Visit: Payer: Self-pay | Admitting: Family Medicine

## 2019-01-22 ENCOUNTER — Other Ambulatory Visit: Payer: Self-pay

## 2019-01-22 DIAGNOSIS — Z20822 Contact with and (suspected) exposure to covid-19: Secondary | ICD-10-CM

## 2019-01-25 LAB — NOVEL CORONAVIRUS, NAA: SARS-CoV-2, NAA: NOT DETECTED

## 2019-01-30 ENCOUNTER — Encounter: Payer: Self-pay | Admitting: Family Medicine

## 2019-01-31 ENCOUNTER — Telehealth: Payer: Self-pay | Admitting: Family Medicine

## 2019-01-31 DIAGNOSIS — R05 Cough: Secondary | ICD-10-CM

## 2019-01-31 DIAGNOSIS — R053 Chronic cough: Secondary | ICD-10-CM

## 2019-01-31 NOTE — Addendum Note (Signed)
Addended by: Eual Fines on: 01/31/2019 11:47 AM   Modules accepted: Orders

## 2019-01-31 NOTE — Addendum Note (Signed)
Addended by: Eual Fines on: 01/31/2019 11:52 AM   Modules accepted: Orders

## 2019-01-31 NOTE — Telephone Encounter (Signed)
Please see patient message.  Please order chest x-ray CBC and differential and Cmp and eGFR to be done today, not stat

## 2019-02-03 LAB — CBC WITH DIFFERENTIAL/PLATELET
Absolute Monocytes: 598 cells/uL (ref 200–950)
Basophils Absolute: 46 cells/uL (ref 0–200)
Basophils Relative: 0.5 %
Eosinophils Absolute: 202 cells/uL (ref 15–500)
Eosinophils Relative: 2.2 %
HCT: 38.6 % (ref 35.0–45.0)
Hemoglobin: 12.9 g/dL (ref 11.7–15.5)
Lymphs Abs: 2907 cells/uL (ref 850–3900)
MCH: 29.6 pg (ref 27.0–33.0)
MCHC: 33.4 g/dL (ref 32.0–36.0)
MCV: 88.5 fL (ref 80.0–100.0)
MPV: 9 fL (ref 7.5–12.5)
Monocytes Relative: 6.5 %
Neutro Abs: 5446 cells/uL (ref 1500–7800)
Neutrophils Relative %: 59.2 %
Platelets: 288 10*3/uL (ref 140–400)
RBC: 4.36 10*6/uL (ref 3.80–5.10)
RDW: 13.9 % (ref 11.0–15.0)
Total Lymphocyte: 31.6 %
WBC: 9.2 10*3/uL (ref 3.8–10.8)

## 2019-02-03 LAB — COMPLETE METABOLIC PANEL WITH GFR
AG Ratio: 1.1 (calc) (ref 1.0–2.5)
ALT: 10 U/L (ref 6–29)
AST: 13 U/L (ref 10–35)
Albumin: 3.9 g/dL (ref 3.6–5.1)
Alkaline phosphatase (APISO): 115 U/L (ref 37–153)
BUN: 12 mg/dL (ref 7–25)
CO2: 25 mmol/L (ref 20–32)
Calcium: 9.6 mg/dL (ref 8.6–10.4)
Chloride: 100 mmol/L (ref 98–110)
Creat: 0.88 mg/dL (ref 0.50–1.05)
GFR, Est African American: 86 mL/min/{1.73_m2} (ref >=60)
GFR, Est Non African American: 74 mL/min/{1.73_m2} (ref >=60)
Globulin: 3.6 g/dL (calc) (ref 1.9–3.7)
Glucose, Bld: 111 mg/dL (ref 65–139)
Potassium: 4.2 mmol/L (ref 3.5–5.3)
Sodium: 136 mmol/L (ref 135–146)
Total Bilirubin: 0.5 mg/dL (ref 0.2–1.2)
Total Protein: 7.5 g/dL (ref 6.1–8.1)

## 2019-02-08 ENCOUNTER — Other Ambulatory Visit: Payer: Self-pay

## 2019-02-08 DIAGNOSIS — Z20822 Contact with and (suspected) exposure to covid-19: Secondary | ICD-10-CM

## 2019-02-09 LAB — NOVEL CORONAVIRUS, NAA: SARS-CoV-2, NAA: NOT DETECTED

## 2019-02-15 ENCOUNTER — Other Ambulatory Visit: Payer: Self-pay | Admitting: Family Medicine

## 2019-03-24 ENCOUNTER — Other Ambulatory Visit: Payer: Self-pay | Admitting: Adult Health

## 2019-03-24 ENCOUNTER — Other Ambulatory Visit: Payer: Self-pay | Admitting: Family Medicine

## 2019-04-10 ENCOUNTER — Other Ambulatory Visit: Payer: Self-pay | Admitting: Family Medicine

## 2019-04-10 ENCOUNTER — Other Ambulatory Visit: Payer: Self-pay | Admitting: Women's Health

## 2019-04-22 ENCOUNTER — Other Ambulatory Visit: Payer: Self-pay | Admitting: Family Medicine

## 2019-04-22 ENCOUNTER — Other Ambulatory Visit: Payer: Self-pay | Admitting: Adult Health

## 2019-04-23 ENCOUNTER — Ambulatory Visit (HOSPITAL_COMMUNITY)
Admission: RE | Admit: 2019-04-23 | Discharge: 2019-04-23 | Disposition: A | Discharge status: Home / Self Care | Payer: BC Managed Care – PPO | Source: Ambulatory Visit | Attending: Family Medicine | Admitting: Family Medicine

## 2019-04-23 ENCOUNTER — Other Ambulatory Visit (HOSPITAL_COMMUNITY): Payer: Self-pay | Admitting: Family Medicine

## 2019-04-23 ENCOUNTER — Other Ambulatory Visit: Payer: Self-pay

## 2019-04-23 DIAGNOSIS — R928 Other abnormal and inconclusive findings on diagnostic imaging of breast: Secondary | ICD-10-CM

## 2019-04-23 DIAGNOSIS — Z1231 Encounter for screening mammogram for malignant neoplasm of breast: Secondary | ICD-10-CM | POA: Diagnosis present

## 2019-04-26 ENCOUNTER — Other Ambulatory Visit: Payer: Self-pay

## 2019-04-26 MED ORDER — PANTOPRAZOLE SODIUM 40 MG PO TBEC: 40.0000 mg | DELAYED_RELEASE_TABLET | Freq: Every day | ORAL | 0 refills | Status: DC

## 2019-05-01 ENCOUNTER — Ambulatory Visit (HOSPITAL_COMMUNITY): Admission: RE | Admit: 2019-05-01 | Payer: BC Managed Care – PPO | Source: Ambulatory Visit

## 2019-05-01 ENCOUNTER — Ambulatory Visit (HOSPITAL_COMMUNITY)
Admission: RE | Admit: 2019-05-01 | Discharge: 2019-05-01 | Disposition: A | Discharge status: Home / Self Care | Payer: BC Managed Care – PPO | Source: Ambulatory Visit | Attending: Family Medicine | Admitting: Family Medicine

## 2019-05-01 ENCOUNTER — Other Ambulatory Visit: Payer: Self-pay

## 2019-05-01 DIAGNOSIS — R928 Other abnormal and inconclusive findings on diagnostic imaging of breast: Secondary | ICD-10-CM | POA: Insufficient documentation

## 2019-05-07 ENCOUNTER — Encounter: Payer: Self-pay | Admitting: Family Medicine

## 2019-05-07 ENCOUNTER — Other Ambulatory Visit: Payer: Self-pay

## 2019-05-07 ENCOUNTER — Ambulatory Visit (INDEPENDENT_AMBULATORY_CARE_PROVIDER_SITE_OTHER): Payer: BC Managed Care – PPO | Admitting: Family Medicine

## 2019-05-07 VITALS — BP 130/90 | HR 79 | Temp 97.7°F | Resp 15 | Ht 66.0 in | Wt 297.0 lb

## 2019-05-07 DIAGNOSIS — Z23 Encounter for immunization: Secondary | ICD-10-CM | POA: Diagnosis not present

## 2019-05-07 DIAGNOSIS — E785 Hyperlipidemia, unspecified: Secondary | ICD-10-CM

## 2019-05-07 DIAGNOSIS — I10 Essential (primary) hypertension: Secondary | ICD-10-CM

## 2019-05-07 DIAGNOSIS — E559 Vitamin D deficiency, unspecified: Secondary | ICD-10-CM

## 2019-05-07 DIAGNOSIS — Z0000 Encounter for general adult medical examination without abnormal findings: Secondary | ICD-10-CM | POA: Diagnosis not present

## 2019-05-07 DIAGNOSIS — R7301 Impaired fasting glucose: Secondary | ICD-10-CM

## 2019-05-07 MED ORDER — MIRABEGRON ER 25 MG PO TB24: 25.0000 mg | ORAL_TABLET | Freq: Every day | ORAL | 1 refills | Status: DC

## 2019-05-07 MED ORDER — SAXENDA 18 MG/3ML ~~LOC~~ SOPN: PEN_INJECTOR | SUBCUTANEOUS | 1 refills | Status: DC

## 2019-05-07 MED ORDER — MELOXICAM 15 MG PO TABS: 15.0000 mg | ORAL_TABLET | Freq: Every day | ORAL | 1 refills | Status: DC

## 2019-05-07 MED ORDER — METOPROLOL TARTRATE 25 MG PO TABS: 25.0000 mg | ORAL_TABLET | Freq: Two times a day (BID) | ORAL | 3 refills | Status: DC

## 2019-05-07 MED ORDER — HYDROCHLOROTHIAZIDE 12.5 MG PO CAPS: 12.5000 mg | ORAL_CAPSULE | Freq: Every day | ORAL | 1 refills | Status: DC

## 2019-05-07 MED ORDER — CYCLOBENZAPRINE HCL 10 MG PO TABS: 10.0000 mg | ORAL_TABLET | Freq: Every day | ORAL | 1 refills | Status: DC

## 2019-05-07 MED ORDER — GABAPENTIN 300 MG PO CAPS: 300.0000 mg | ORAL_CAPSULE | Freq: Every day | ORAL | 1 refills | Status: DC

## 2019-05-07 MED ORDER — POTASSIUM CHLORIDE CRYS ER 20 MEQ PO TBCR: 20.0000 meq | EXTENDED_RELEASE_TABLET | Freq: Every day | ORAL | 1 refills | Status: DC

## 2019-05-07 NOTE — Patient Instructions (Addendum)
F/U in office with MD mid December, needs BP re evaluation, and re eval weight AND SHINGRIX #1  Flu vaccine today  Fasting labs 11/06, CBC, lipid, cmp and eGFr, tSH, vit D and hBA1C  You will receive 1500 CAL DIET SHEET NEW IS DAILY SAXENDA TO HELP WITH WEIGHT LOSS  It is important that you exercise regularly at least 30 minutes 5 times a week. If you develop chest pain, have severe difficulty breathing, or feel very tired, stop exercising immediately and seek medical attention   ADDITIONAL MEDICATION FOR BLOOD PRESSURE , HCTZ AND POTASSIUM, CONTINUE METOPROLOL AS BEFORE   Thanks for choosing  Primary Care, we consider it a privelige to serve you.

## 2019-05-07 NOTE — Assessment & Plan Note (Signed)

## 2019-05-07 NOTE — Progress Notes (Signed)
Julie Ryan     MRN: [id number]      DOB: August 10, 1965  HPI: Patient is in for annual physical exam. C/o excess weight gain. Panned bariatric surgery had to be postponed due to her becoming infected with covid 19. Recent labs, if available are reviewed. Immunization is reviewed , and  updated    PE: BP 130/90   Pulse 79   Temp 97.7 F (36.5 C) (Temporal)   Resp 15   Ht 5\' 6"  (1.676 m)   Wt 297 lb (134.7 kg)   SpO2 96%   BMI 47.94 kg/m   Pleasant  female, alert and oriented x 3, in no cardio-pulmonary distress. Afebrile. HEENT No facial trauma or asymetry. Sinuses non tender.  Extra occullar muscles intact.. External ears normal, . Neck: supple, no adenopathy,JVD or thyromegaly.No bruits.  Chest: Clear to ascultation bilaterally.No crackles or wheezes. Non tender to palpation  Breast: Not examined  Cardiovascular system; Heart sounds normal,  S1 and  S2 ,no S3.  No murmur, or thrill. Apical beat not displaced Peripheral pulses normal.  Abdomen: Soft, non tender,  GU: Asymptomatic. Not examined.   Musculoskeletal exam: Full ROM of spine, hips , shoulders and knees. No deformity ,swelling or crepitus noted. No muscle wasting or atrophy.   Neurologic: Cranial nerves 2 to 12 intact. Power, tone ,sensation and reflexes normal throughout. No disturbance in gait. No tremor.  Skin: Intact, no ulceration, erythema , scaling or rash noted. Pigmentation normal throughout  Psych; Normal mood and affect. Judgement and concentration normal   Assessment & Plan:  Annual physical exam Annual exam as documented. Counseling done  re healthy lifestyle involving commitment to 150 minutes exercise per week, heart healthy diet, and attaining healthy weight.The importance of adequate sleep also discussed. Regular seat belt use and home safety, is also discussed. Changes in health habits are decided on by the patient with goals and time frames  set for achieving  them. Immunization and cancer screening needs are specifically addressed at this visit.   Essential hypertension Uncontrolled, add HCTZ 12.5 mg, and potassium 20 meq DASH diet and commitment to daily physical activity for a minimum of 30 minutes discussed and encouraged, as a part of hypertension management. The importance of attaining a healthy weight is also discussed.  BP/Weight 05/07/2019 12/14/2018 08/14/2018 04/12/2018 12/12/2017 11/17/2017 123456  Systolic BP [id number] 123456 123456 123456 123456  Diastolic BP 90 90 90 70 84 70 68  Wt. (Lbs) 297 280 268 270 257 258 261  BMI 47.94 45.19 43.26 43.58 41.48 41.64 42.13  Some encounter information is confidential and restricted. Go to Review Flowsheets activity to see all data.       MORBID OBESITY Expects bariatric surgery in the next monht  Patient re-educated about  the importance of commitment to a  minimum of 150 minutes of exercise per week as able.  The importance of healthy food choices with portion control discussed, as well as eating regularly and within a 12 hour window most days. The need to choose "clean , green" food 50 to 75% of the time is discussed, as well as to make water the primary drink and set a goal of 64 ounces water daily.    Weight /BMI 05/07/2019 12/14/2018 08/14/2018  WEIGHT 297 lb 280 lb 268 lb  HEIGHT 5\' 6"  5\' 6"  5\' 6"   BMI 47.94 kg/m2 45.19 kg/m2 43.26 kg/m2  Some encounter information is confidential and restricted. Go to Review Flowsheets activity to  see all data.    Start saxneda, 1500 cal diet

## 2019-05-07 NOTE — Assessment & Plan Note (Signed)
Expects bariatric surgery in the next monht  Patient re-educated about  the importance of commitment to a  minimum of 150 minutes of exercise per week as able.  The importance of healthy food choices with portion control discussed, as well as eating regularly and within a 12 hour window most days. The need to choose "clean , green" food 50 to 75% of the time is discussed, as well as to make water the primary drink and set a goal of 64 ounces water daily.    Weight /BMI 05/07/2019 12/14/2018 08/14/2018  WEIGHT 297 lb 280 lb 268 lb  HEIGHT 5\' 6"  5\' 6"  5\' 6"   BMI 47.94 kg/m2 45.19 kg/m2 43.26 kg/m2  Some encounter information is confidential and restricted. Go to Review Flowsheets activity to see all data.    Start saxneda, 1500 cal diet

## 2019-05-07 NOTE — Assessment & Plan Note (Signed)
Uncontrolled, add HCTZ 12.5 mg, and potassium 20 meq DASH diet and commitment to daily physical activity for a minimum of 30 minutes discussed and encouraged, as a part of hypertension management. The importance of attaining a healthy weight is also discussed.  BP/Weight 05/07/2019 12/14/2018 08/14/2018 04/12/2018 12/12/2017 11/17/2017 123456  Systolic BP [id number] 123456 123456 123456 123456  Diastolic BP 90 90 90 70 84 70 68  Wt. (Lbs) 297 280 268 270 257 258 261  BMI 47.94 45.19 43.26 43.58 41.48 41.64 42.13  Some encounter information is confidential and restricted. Go to Review Flowsheets activity to see all data.

## 2019-05-08 MED ORDER — SAXENDA 18 MG/3ML ~~LOC~~ SOPN: PEN_INJECTOR | SUBCUTANEOUS | 1 refills | Status: DC

## 2019-05-10 ENCOUNTER — Encounter: Payer: Self-pay | Admitting: Family Medicine

## 2019-05-10 ENCOUNTER — Telehealth: Payer: Self-pay

## 2019-05-10 MED ORDER — PEN NEEDLES 31G X 8 MM MISC: 1.0000 | 0 refills | Status: DC

## 2019-05-10 NOTE — Telephone Encounter (Signed)
Pen needles sent in to be used with saxenda

## 2019-05-12 LAB — LIPID PANEL
Cholesterol: 192 mg/dL (ref <200)
HDL: 49 mg/dL — ABNORMAL LOW (ref >=50)
LDL Cholesterol (Calc): 122 mg/dL (calc) — ABNORMAL HIGH
Non-HDL Cholesterol (Calc): 143 mg/dL (calc) — ABNORMAL HIGH (ref <130)
Total CHOL/HDL Ratio: 3.9 (calc) (ref <5.0)
Triglycerides: 106 mg/dL (ref <150)

## 2019-05-12 LAB — CBC
HCT: 36.9 % (ref 35.0–45.0)
Hemoglobin: 12.4 g/dL (ref 11.7–15.5)
MCH: 29.6 pg (ref 27.0–33.0)
MCHC: 33.6 g/dL (ref 32.0–36.0)
MCV: 88.1 fL (ref 80.0–100.0)
MPV: 9.7 fL (ref 7.5–12.5)
Platelets: 313 10*3/uL (ref 140–400)
RBC: 4.19 10*6/uL (ref 3.80–5.10)
RDW: 12.8 % (ref 11.0–15.0)
WBC: 8.5 10*3/uL (ref 3.8–10.8)

## 2019-05-12 LAB — COMPLETE METABOLIC PANEL WITH GFR
AG Ratio: 1.2 (calc) (ref 1.0–2.5)
ALT: 12 U/L (ref 6–29)
AST: 15 U/L (ref 10–35)
Albumin: 3.8 g/dL (ref 3.6–5.1)
Alkaline phosphatase (APISO): 99 U/L (ref 37–153)
BUN: 12 mg/dL (ref 7–25)
CO2: 27 mmol/L (ref 20–32)
Calcium: 9.3 mg/dL (ref 8.6–10.4)
Chloride: 104 mmol/L (ref 98–110)
Creat: 0.88 mg/dL (ref 0.50–1.05)
GFR, Est African American: 86 mL/min/{1.73_m2} (ref >=60)
GFR, Est Non African American: 74 mL/min/{1.73_m2} (ref >=60)
Globulin: 3.2 g/dL (calc) (ref 1.9–3.7)
Glucose, Bld: 124 mg/dL — ABNORMAL HIGH (ref 65–99)
Potassium: 4.3 mmol/L (ref 3.5–5.3)
Sodium: 133 mmol/L — ABNORMAL LOW (ref 135–146)
Total Bilirubin: 0.6 mg/dL (ref 0.2–1.2)
Total Protein: 7 g/dL (ref 6.1–8.1)

## 2019-05-12 LAB — VITAMIN D 25 HYDROXY (VIT D DEFICIENCY, FRACTURES): Vit D, 25-Hydroxy: 27 ng/mL — ABNORMAL LOW (ref 30–100)

## 2019-05-12 LAB — TSH: TSH: 2.39 mIU/L

## 2019-05-12 LAB — HEMOGLOBIN A1C
Hgb A1c MFr Bld: 6.7 % of total Hgb — ABNORMAL HIGH (ref <5.7)
Mean Plasma Glucose: 146 (calc)
eAG (mmol/L): 8.1 (calc)

## 2019-05-14 ENCOUNTER — Encounter: Payer: Self-pay | Admitting: Family Medicine

## 2019-05-20 ENCOUNTER — Other Ambulatory Visit: Payer: Self-pay | Admitting: Family Medicine

## 2019-06-13 ENCOUNTER — Ambulatory Visit: Payer: BC Managed Care – PPO | Admitting: Family Medicine

## 2019-06-19 ENCOUNTER — Other Ambulatory Visit: Payer: Self-pay

## 2019-06-19 ENCOUNTER — Encounter: Payer: Self-pay | Admitting: Family Medicine

## 2019-06-19 ENCOUNTER — Ambulatory Visit (INDEPENDENT_AMBULATORY_CARE_PROVIDER_SITE_OTHER): Payer: BC Managed Care – PPO | Admitting: Family Medicine

## 2019-06-19 VITALS — BP 118/62 | HR 100 | Resp 15 | Ht 66.0 in | Wt 289.0 lb

## 2019-06-19 DIAGNOSIS — K219 Gastro-esophageal reflux disease without esophagitis: Secondary | ICD-10-CM

## 2019-06-19 DIAGNOSIS — E119 Type 2 diabetes mellitus without complications: Secondary | ICD-10-CM

## 2019-06-19 DIAGNOSIS — E1159 Type 2 diabetes mellitus with other circulatory complications: Secondary | ICD-10-CM

## 2019-06-19 DIAGNOSIS — E785 Hyperlipidemia, unspecified: Secondary | ICD-10-CM

## 2019-06-19 DIAGNOSIS — I10 Essential (primary) hypertension: Secondary | ICD-10-CM | POA: Diagnosis not present

## 2019-06-19 NOTE — Patient Instructions (Addendum)
F/U in 6  weeks in office with MD, call if you need me before  I Continue current  Medication, but this week Thursday since surgery is expected in the near future, I do not believe the phentermine  should be continued at that time.   Congrats on weight loss    All the best with planned surgery  Microalb today  Thanks for choosing [facility], we consider it a privelige to serve you.

## 2019-06-20 ENCOUNTER — Other Ambulatory Visit (HOSPITAL_COMMUNITY)
Admission: RE | Admit: 2019-06-20 | Discharge: 2019-06-20 | Disposition: A | Discharge status: Home / Self Care | Payer: BC Managed Care – PPO | Source: Skilled Nursing Facility | Attending: Family Medicine | Admitting: Family Medicine

## 2019-06-20 DIAGNOSIS — E119 Type 2 diabetes mellitus without complications: Secondary | ICD-10-CM | POA: Insufficient documentation

## 2019-06-21 LAB — MICROALBUMIN / CREATININE URINE RATIO
Creatinine, Urine: 114.9 mg/dL
Microalb Creat Ratio: 3 mg/g creat (ref 0–29)
Microalb, Ur: 3.8 ug/mL — ABNORMAL HIGH

## 2019-06-24 ENCOUNTER — Encounter: Payer: Self-pay | Admitting: Family Medicine

## 2019-06-24 NOTE — Assessment & Plan Note (Addendum)
  Patient re-educated about  the importance of commitment to a  minimum of 150 minutes of exercise per week as able.  The importance of healthy food choices with portion control discussed, as well as eating regularly and within a 12 hour window most days. The need to choose "clean , green" food 50 to 75% of the time is discussed, as well as to make water the primary drink and set a goal of 64 ounces water daily.    Weight /BMI 06/19/2019 05/07/2019 12/14/2018  WEIGHT 289 lb 297 lb 280 lb  HEIGHT 5\' 6"  5\' 6"  5\' 6"   BMI [id number] kg/m2 47.94 kg/m2 45.19 kg/m2  Some encounter information is confidential and restricted. Go to Review Flowsheets activity to see all data.   No refill on  saxenda as bariatric surgery is anticipated in next 6 weeks

## 2019-06-24 NOTE — Assessment & Plan Note (Signed)
Hyperlipidemia:Low fat diet discussed and encouraged.   Lipid Panel  Lab Results  Component Value Date   CHOL 192 05/11/2019   HDL 49 (L) 05/11/2019   LDLCALC 122 (H) 05/11/2019   TRIG 106 05/11/2019   CHOLHDL 3.9 05/11/2019   Needs to increase exercise and reduce fat intake

## 2019-06-24 NOTE — Assessment & Plan Note (Signed)
[name] is reminded of the importance of commitment to daily physical activity for 30 minutes or more, as able and the need to limit carbohydrate intake to 30 to 60 grams per meal to help with blood sugar control.   The need to take medication as prescribed, test blood sugar as directed, and to call between visits if there is a concern that blood sugar is uncontrolled is also discussed.   [name] is reminded of the importance of daily foot exam, annual eye examination, and good blood sugar, blood pressure and cholesterol control.  Diabetic Labs Latest Ref Rng & Units 06/20/2019 05/11/2019 02/02/2019 08/14/2018 09/07/2017  HbA1c <5.7 % of total Hgb - 6.7(H) - 6.2(H) 5.7(H)  Microalbumin Not Estab. ug/mL 3.8(H) - - - -  Micro/Creat Ratio 0 - 29 mg/g creat 3 - - - -  Chol <200 mg/dL - 192 - 197 -  HDL > OR = 50 mg/dL - 49(L) - 49(L) -  Calc LDL mg/dL (calc) - 122(H) - 123(H) -  Triglycerides <150 mg/dL - 106 - 142 -  Creatinine 0.50 - 1.05 mg/dL - 0.88 0.88 0.92 0.81   BP/Weight 06/19/2019 05/07/2019 12/14/2018 08/14/2018 04/12/2018 12/12/2017 [id number]  Systolic BP 123456 [id number] 123456 123456 123456  Diastolic BP 62 90 90 90 70 84 70  Wt. (Lbs) 289 297 280 268 270 257 258  BMI 46.65 47.94 45.19 43.26 43.58 41.48 41.64  Some encounter information is confidential and restricted. Go to Review Flowsheets activity to see all data.   Foot/eye exam completion dates Latest Ref Rng & Units 12/09/2016  Eye Exam No Retinopathy No Retinopathy  Foot Form Completion - -

## 2019-06-24 NOTE — Assessment & Plan Note (Signed)
Controlled, no change in medication DASH diet and commitment to daily physical activity for a minimum of 30 minutes discussed and encouraged, as a part of hypertension management. The importance of attaining a healthy weight is also discussed.  BP/Weight 06/19/2019 05/07/2019 12/14/2018 08/14/2018 04/12/2018 12/12/2017 [id number]  Systolic BP 123456 [id number] 123456 123456 123456  Diastolic BP 62 90 90 90 70 84 70  Wt. (Lbs) 289 297 280 268 270 257 258  BMI 46.65 47.94 45.19 43.26 43.58 41.48 41.64  Some encounter information is confidential and restricted. Go to Review Flowsheets activity to see all data.

## 2019-06-24 NOTE — Assessment & Plan Note (Signed)
Controlled, no change in medication  

## 2019-06-24 NOTE — Progress Notes (Signed)
Julie Ryan     MRN: [id number]      DOB: [birth year]   HPI [name] is here for follow up and re-evaluation of chronic medical conditions, medication management and review of any available recent lab and radiology data.  Preventive health is updated, specifically  Cancer screening and Immunization.   Questions or concerns regarding consultations or procedures which the PT has had in the interim are  addressed. The PT denies any adverse reactions to current medications since the last visit.  There are no new concerns.  There are no specific complaints   ROS Denies recent fever or chills. Denies sinus pressure, nasal congestion, ear pain or sore throat. Denies chest congestion, productive cough or wheezing. Denies chest pains, palpitations and leg swelling Denies abdominal pain, nausea, vomiting,diarrhea or constipation.   Denies dysuria, frequency, hesitancy or incontinence. Denies joint pain, swelling and limitation in mobility. Denies headaches, seizures, numbness, or tingling. Denies depression, anxiety or insomnia. Denies skin break down or rash.   PE  BP 118/62   Pulse 100   Resp 15   Ht 5\' 6"  (1.676 m)   Wt 289 lb (131.1 kg)   SpO2 97%   BMI 46.65 kg/m   Patient alert and oriented and in no cardiopulmonary distress.  HEENT: No facial asymmetry, EOMI,     Neck supple .  Chest: Clear to auscultation bilaterally.  CVS: S1, S2 no murmurs, no S3.Regular rate.  ABD: Soft non tender.   Ext: No edema  MS: Adequate ROM spine, shoulders, hips and knees.  Skin: Intact, no ulcerations or rash noted.  Psych: Good eye contact, normal affect. Memory intact not anxious or depressed appearing.  CNS: CN 2-12 intact, power,  normal throughout.no focal deficits noted.   Assessment & Plan  Essential hypertension Controlled, no change in medication DASH diet and commitment to daily physical activity for a minimum of 30 minutes discussed and encouraged, as a part of  hypertension management. The importance of attaining a healthy weight is also discussed.  BP/Weight 06/19/2019 05/07/2019 12/14/2018 08/14/2018 04/12/2018 12/12/2017 [id number]  Systolic BP 123456 [id number] 123456 123456 123456  Diastolic BP 62 90 90 90 70 84 70  Wt. (Lbs) 289 297 280 268 270 257 258  BMI 46.65 47.94 45.19 43.26 43.58 41.48 41.64  Some encounter information is confidential and restricted. Go to Review Flowsheets activity to see all data.       MORBID OBESITY  Patient re-educated about  the importance of commitment to a  minimum of 150 minutes of exercise per week as able.  The importance of healthy food choices with portion control discussed, as well as eating regularly and within a 12 hour window most days. The need to choose "clean , green" food 50 to 75% of the time is discussed, as well as to make water the primary drink and set a goal of 64 ounces water daily.    Weight /BMI 06/19/2019 05/07/2019 12/14/2018  WEIGHT 289 lb 297 lb 280 lb  HEIGHT 5\' 6"  5\' 6"  5\' 6"   BMI 46.65 kg/m2 47.94 kg/m2 45.19 kg/m2  Some encounter information is confidential and restricted. Go to Review Flowsheets activity to see all data.      Hyperlipidemia LDL goal <100 Hyperlipidemia:Low fat diet discussed and encouraged.   Lipid Panel  Lab Results  Component Value Date   CHOL 192 05/11/2019   HDL 49 (L) 05/11/2019   LDLCALC 122 (H) 05/11/2019   TRIG 106 05/11/2019  CHOLHDL 3.9 05/11/2019   Needs to increase exercise and reduce fat intake    GERD (gastroesophageal reflux disease) Controlled, no change in medication

## 2019-06-25 ENCOUNTER — Encounter: Payer: Self-pay | Admitting: Family Medicine

## 2019-07-05 MED ORDER — OXYCODONE HCL 5 MG/5ML PO SOLN
5.00 | ORAL | Status: DC
Start: ? — End: 2019-07-05

## 2019-07-05 MED ORDER — HYDROMORPHONE HCL 1 MG/ML IJ SOLN
0.50 | INTRAMUSCULAR | Status: DC
Start: ? — End: 2019-07-05

## 2019-07-05 MED ORDER — DIPHENHYDRAMINE HCL 50 MG/ML IJ SOLN
25.00 | INTRAMUSCULAR | Status: DC
Start: ? — End: 2019-07-05

## 2019-07-05 MED ORDER — KCL IN DEXTROSE-NACL 20-5-0.45 MEQ/L-%-% IV SOLN
125.00 | INTRAVENOUS | Status: DC
Start: ? — End: 2019-07-05

## 2019-07-05 MED ORDER — ONDANSETRON HCL 4 MG/2ML IJ SOLN
4.00 | INTRAMUSCULAR | Status: DC
Start: ? — End: 2019-07-05

## 2019-07-05 MED ORDER — LABETALOL HCL 5 MG/ML IV SOLN
5.00 | INTRAVENOUS | Status: DC
Start: ? — End: 2019-07-05

## 2019-07-05 MED ORDER — SIMETHICONE 80 MG PO CHEW
160.00 | CHEWABLE_TABLET | ORAL | Status: DC
Start: ? — End: 2019-07-05

## 2019-07-05 MED ORDER — ENOXAPARIN SODIUM 40 MG/0.4ML ~~LOC~~ SOLN
40.00 | SUBCUTANEOUS | Status: DC
Start: 2019-07-05 — End: 2019-07-05

## 2019-07-05 MED ORDER — ACETAMINOPHEN 650 MG RE SUPP
650.00 | RECTAL | Status: DC
Start: ? — End: 2019-07-05

## 2019-07-05 MED ORDER — ALBUTEROL SULFATE (2.5 MG/3ML) 0.083% IN NEBU
2.50 | INHALATION_SOLUTION | RESPIRATORY_TRACT | Status: DC
Start: ? — End: 2019-07-05

## 2019-07-05 MED ORDER — ACETAMINOPHEN 325 MG PO TABS
650.00 | ORAL_TABLET | ORAL | Status: DC
Start: ? — End: 2019-07-05

## 2019-07-05 MED ORDER — HYDRALAZINE HCL 20 MG/ML IJ SOLN
10.00 | INTRAMUSCULAR | Status: DC
Start: ? — End: 2019-07-05

## 2019-07-05 MED ORDER — GABAPENTIN 300 MG PO CAPS
300.00 | ORAL_CAPSULE | ORAL | Status: DC
Start: 2019-07-04 — End: 2019-07-05

## 2019-07-05 MED ORDER — GENERIC EXTERNAL MEDICATION
12.50 | Status: DC
Start: ? — End: 2019-07-05

## 2019-07-05 MED ORDER — HYDROCODONE-ACETAMINOPHEN 7.5-325 MG/15ML PO SOLN
10.00 | ORAL | Status: DC
Start: ? — End: 2019-07-05

## 2019-07-05 MED ORDER — ALUM & MAG HYDROXIDE-SIMETH 200-200-20 MG/5ML PO SUSP
20.00 | ORAL | Status: DC
Start: ? — End: 2019-07-05

## 2019-07-09 ENCOUNTER — Telehealth: Payer: Self-pay

## 2019-07-09 ENCOUNTER — Encounter: Payer: Self-pay | Admitting: Family Medicine

## 2019-07-09 ENCOUNTER — Other Ambulatory Visit: Payer: Self-pay | Admitting: Family Medicine

## 2019-07-09 ENCOUNTER — Other Ambulatory Visit: Payer: Self-pay | Admitting: Women's Health

## 2019-07-09 MED ORDER — POTASSIUM CHLORIDE 20 MEQ/15ML (10%) PO SOLN: ORAL | 2 refills | Status: DC

## 2019-07-09 NOTE — Telephone Encounter (Signed)
Transition Care Management Follow-up Telephone Call   Date discharged?  07/04/2019              How have you been since you were released from the hospital? lower back and stomach are hurting because she hasn't been able to pass gas yet   Do you understand why you were in the hospital? Gastric bypass surgery and hernia repair    Do you understand the discharge instructions?yes   Where were you discharged to? home   Items Reviewed:  Medications reviewed: yes  Allergies reviewed: yes  Dietary changes reviewed: gastric bypass diet  Referrals reviewed: no knew referrals   Functional Questionnaire:   Activities of Daily Living (ADLs):  not all of them.    Any transportation issues/concerns?: no   Any patient concerns? no   Confirmed importance and date/time of follow-up visits scheduled 07/13/2019 with DNP     Confirmed with patient if condition begins to worsen call PCP or go to the ER.  Patient was given the office number and encouraged to call back with question or concerns. Yes with verbal understanding

## 2019-07-11 ENCOUNTER — Encounter: Payer: Self-pay | Admitting: *Deleted

## 2019-07-12 ENCOUNTER — Other Ambulatory Visit: Payer: Self-pay | Admitting: Adult Health

## 2019-07-12 MED ORDER — ESTRADIOL 0.1 MG/GM VA CREA: TOPICAL_CREAM | VAGINAL | 2 refills | Status: DC

## 2019-07-12 NOTE — Progress Notes (Signed)
Will rx estrace vaginal cream ,says PVC is costly

## 2019-07-13 ENCOUNTER — Other Ambulatory Visit: Payer: Self-pay | Admitting: Family Medicine

## 2019-07-13 ENCOUNTER — Inpatient Hospital Stay: Payer: BC Managed Care – PPO | Admitting: Family Medicine

## 2019-07-17 ENCOUNTER — Ambulatory Visit (INDEPENDENT_AMBULATORY_CARE_PROVIDER_SITE_OTHER): Payer: BC Managed Care – PPO | Admitting: Family Medicine

## 2019-07-17 ENCOUNTER — Other Ambulatory Visit: Payer: Self-pay

## 2019-07-17 ENCOUNTER — Other Ambulatory Visit: Payer: BC Managed Care – PPO

## 2019-07-17 ENCOUNTER — Encounter: Payer: Self-pay | Admitting: Family Medicine

## 2019-07-17 VITALS — BP 138/98 | HR 110 | Temp 98.2°F | Resp 15 | Ht 66.0 in | Wt 259.0 lb

## 2019-07-17 DIAGNOSIS — Z9884 Bariatric surgery status: Secondary | ICD-10-CM

## 2019-07-17 DIAGNOSIS — I10 Essential (primary) hypertension: Secondary | ICD-10-CM

## 2019-07-17 DIAGNOSIS — K449 Diaphragmatic hernia without obstruction or gangrene: Secondary | ICD-10-CM

## 2019-07-17 DIAGNOSIS — Z7689 Persons encountering health services in other specified circumstances: Secondary | ICD-10-CM

## 2019-07-17 DIAGNOSIS — R000 Tachycardia, unspecified: Secondary | ICD-10-CM

## 2019-07-17 DIAGNOSIS — E1159 Type 2 diabetes mellitus with other circulatory complications: Secondary | ICD-10-CM | POA: Diagnosis not present

## 2019-07-17 NOTE — Assessment & Plan Note (Signed)
Refusing any medications at this time for Blood sugar control. Encouraged her to think about this before her next appt. As you should be on something given her increase of A1c over the last year.

## 2019-07-17 NOTE — Assessment & Plan Note (Signed)
Please see note for documentation. No updated labs needed. Pt needs fluids, does not want to go to ED at this time, will try to drink more. Advised follow up with surgeon office.

## 2019-07-17 NOTE — Assessment & Plan Note (Signed)
With [facility]- s/p 07/02/2019 Healing incisions, struggling to keep taking in liquid diet. Has follow up via telephone tomorrow.

## 2019-07-17 NOTE — Assessment & Plan Note (Signed)
ST today-most likely related to dehydration, I have encouraged fluids. Advised to follow up about this with sx office and go to the ED if she  Starts to feel worse, becomes dizzy, short of breath or experiences palpitations or chest pain. Patient acknowledged agreement and understanding of the plan.

## 2019-07-17 NOTE — Patient Instructions (Addendum)
Happy New Year! May you have a year filled with hope, love, happiness and laughter.  I appreciate the opportunity to provide you with care for your health and wellness. Today we discussed: recent surgery  Follow up: 08/06/2019 as previously scheduled  No labs or referrals today  Try your best to hydrate, your Heart Rate is elevated, which might mean you are dehydrated.  Please follow up with your Surgeon to make sure you do not need to go in for fluids.  Please continue to practice social distancing to keep you, your family, and our community safe.  If you must go out, please wear a mask and practice good handwashing.  It was a pleasure to see you and I look forward to continuing to work together on your health and well-being. Please do not hesitate to call the office if you need care or have questions about your care.  Have a wonderful day and week. With Gratitude, Cherly Beach, DNP, AGNP-BC

## 2019-07-17 NOTE — Assessment & Plan Note (Signed)
Stable, needs to continue to focus on DASH diet and exercise.

## 2019-07-17 NOTE — Progress Notes (Signed)
Subjective:  Patient ID: Julie Ryan, female    DOB: 16-Jul-1965  Age: 54 y.o. MRN: [id number]  CC:  Chief Complaint  Patient presents with  . Transitions Of Care     HPI  HPI  54 yo F w/PMHx of DM2, HLD, HTN, and morbid obesity who presents to the [facility] 07/02/2019 for a RNY gastric bypass and hiatal hernia repair with Dr. Luanna Cole.  Hospital Course: The patient presented on 07/02/2019 for RNY gastric bypass and hiatal hernia repair with Dr. Luanna Cole. She tolerated the procedure well without any noted complications.   POD#1: Her UGI did not demonstrate any surgical complications. She is tolerating her CL diet, although her PO intake is below goal. She is ambulating and using IS consistently. She attended her bariatric education class.  POD#2: Patient's p.o. intake has improved to goal. Her pain is well controlled, and she is ambulating frequently. She does not report any nausea/vomiting, chest pain, shortness of breath, or fever/chills.  Prior to discharge, she was seen by a registered dietitian for additional nutrition education and the bariatric coordinator for discharge instruction and planning. On the day of discharge, pain is adequately controlled on po PRN medications, she is breathing comfortably on room air, remains hemodynamically stable, is tolerating bariatric diet, voiding, and is ambulatory. She is being discharged in stable condition. Recommendations: Respiratory: Continue using incentive spirometry at least 10 times ever hour while awake until bariatric surgery follow-up.   Diet: Bariatric clear liquids to advance per nutrition guidelines  Activity: As tolerated. Do not lift anything heavier than 20 pounds for 1 month. May shower; do not submerge incisions. Do not work or drive while taking narcotics.  Follow up:  Bariatric Surgery, Dr. Luanna Cole, 2 weeks  Today patient denies signs and symptoms of COVID 19 infection including fever, chills, cough, shortness of  breath, and headache. Past Medical, Surgical, Social History, Allergies, and Medications have been Reviewed.   Past Medical History:  Diagnosis Date  . Anxiety   . Carpal tunnel syndrome of right wrist   . Chronic knee pain   . Depression   . Diabetes mellitus without complication ([place])   . Endometrial polyp 08/09/2017   will get appt with Dr Elonda Husky  . Fibroids 08/09/2017   Has multiple small fibroids   . GERD (gastroesophageal reflux disease)   . Hypertension   . Neuropathy   . Obesity   . Prediabetes   . Seizures ([place])    1 seizure as a chold; unknown etiology and has never been on meds  . Sleep apnea    did not go back to get results  . Snoring    normal/ unremakable sleep study per neurology in 12/2016    Current Meds  Medication Sig  . albuterol (VENTOLIN HFA) 108 (90 Base) MCG/ACT inhaler Inhale 2 puffs into the lungs every 6 (six) hours as needed for wheezing or shortness of breath.  . cyclobenzaprine (FLEXERIL) 10 MG tablet Take 1 tablet (10 mg total) by mouth at bedtime.  Marland Kitchen estradiol (ESTRACE VAGINAL) 0.1 MG/GM vaginal cream Use 1 applicator in vagina at bedtime 2-3 x weekly  . gabapentin (NEURONTIN) 300 MG capsule Take 1 capsule (300 mg total) by mouth at bedtime.  . hydrochlorothiazide (MICROZIDE) 12.5 MG capsule Take 1 capsule (12.5 mg total) by mouth daily.  . meloxicam (MOBIC) 15 MG tablet Take 1 tablet (15 mg total) by mouth daily.  . metoprolol tartrate (LOPRESSOR) 25 MG tablet Take 1 tablet (25 mg  total) by mouth 2 (two) times daily.  . mirabegron ER (MYRBETRIQ) 25 MG TB24 tablet Take 1 tablet (25 mg total) by mouth daily.  . pantoprazole (PROTONIX) 40 MG tablet Take 1 tablet (40 mg total) by mouth daily.  . potassium chloride 20 MEQ/15ML (10%) SOLN Take 15 ml by mouth once daily  . vitamin B-12 (CYANOCOBALAMIN) 500 MCG tablet Take 500 mcg by mouth daily.  . [DISCONTINUED] Insulin Pen Needle (PEN NEEDLES) 31G X 8 MM MISC 1 each by Does not apply route as directed.     ROS:  Review of Systems  Constitutional: Negative.   HENT: Negative.   Eyes: Negative.   Respiratory: Negative.   Cardiovascular: Negative.   Gastrointestinal: Negative.   Genitourinary: Negative.   Musculoskeletal: Negative.   Skin: Negative.   Neurological: Negative.   Endo/Heme/Allergies: Negative.   Psychiatric/Behavioral: Negative.   All other systems reviewed and are negative.    Objective:   Today's Vitals: BP (!) 138/98   Pulse (!) 110   Temp 98.2 F (36.8 C) (Oral)   Resp 15   Ht 5\' 6"  (1.676 m)   Wt 259 lb (117.5 kg)   SpO2 96%   BMI 41.80 kg/m  Vitals with BMI 07/17/2019 06/19/2019 05/07/2019  Height 5\' 6"  5\' 6"  5\' 6"   Weight 259 lbs 289 lbs 297 lbs  BMI 41.82 0000000 [id number]  Systolic 0000000 123456 [id number]  Diastolic 98 62 90  Pulse [id number] 100 79  Some encounter information is confidential and restricted. Go to Review Flowsheets activity to see all data.     Physical Exam Vitals and nursing note reviewed.  Constitutional:      Appearance: Normal appearance. She is well-developed and well-groomed. She is morbidly obese.  HENT:     Head: Normocephalic and atraumatic.     Right Ear: External ear normal.     Left Ear: External ear normal.     Nose: Nose normal.     Mouth/Throat:     Mouth: Mucous membranes are moist.     Pharynx: Oropharynx is clear.  Eyes:     General:        Right eye: No discharge.        Left eye: No discharge.     Conjunctiva/sclera: Conjunctivae normal.  Cardiovascular:     Rate and Rhythm: Normal rate and regular rhythm.     Pulses: Normal pulses.     Heart sounds: Normal heart sounds.  Pulmonary:     Effort: Pulmonary effort is normal.     Breath sounds: Normal breath sounds.  Abdominal:     Comments: 5 healing incisions from sx  Musculoskeletal:        General: Normal range of motion.     Cervical back: Normal range of motion and neck supple.  Skin:    General: Skin is warm.  Neurological:     General: No focal deficit  present.     Mental Status: She is alert and oriented to person, place, and time.  Psychiatric:        Attention and Perception: Attention normal.        Mood and Affect: Mood normal.        Speech: Speech normal.        Behavior: Behavior normal. Behavior is cooperative.        Thought Content: Thought content normal.        Cognition and Memory: Cognition normal.        Judgment: Judgment normal.  Assessment   1. Encounter for support and coordination of transition of care   2. Type 2 diabetes mellitus with vascular disease ([place])   3. MORBID OBESITY   4. Hiatal hernia   5. History of Roux-en-Y gastric bypass   6. Essential hypertension   7. Sinus tachycardia     Tests ordered No orders of the defined types were placed in this encounter.    Plan: Please see assessment and plan per problem list above.   No orders of the defined types were placed in this encounter.   Patient to follow-up in 08/06/2019   Perlie Mayo, NP

## 2019-07-17 NOTE — Assessment & Plan Note (Signed)
S/p repair on 07/02/2019

## 2019-07-27 ENCOUNTER — Emergency Department (HOSPITAL_COMMUNITY)
Admission: EM | Admit: 2019-07-27 | Discharge: 2019-07-27 | Disposition: A | Discharge status: Home / Self Care | Payer: BC Managed Care – PPO | Attending: Emergency Medicine | Admitting: Emergency Medicine

## 2019-07-27 ENCOUNTER — Emergency Department (HOSPITAL_COMMUNITY): Payer: BC Managed Care – PPO

## 2019-07-27 ENCOUNTER — Encounter (HOSPITAL_COMMUNITY): Payer: Self-pay

## 2019-07-27 ENCOUNTER — Other Ambulatory Visit: Payer: Self-pay

## 2019-07-27 DIAGNOSIS — R5383 Other fatigue: Secondary | ICD-10-CM | POA: Insufficient documentation

## 2019-07-27 DIAGNOSIS — Z79899 Other long term (current) drug therapy: Secondary | ICD-10-CM | POA: Insufficient documentation

## 2019-07-27 DIAGNOSIS — Z91040 Latex allergy status: Secondary | ICD-10-CM | POA: Insufficient documentation

## 2019-07-27 DIAGNOSIS — K5900 Constipation, unspecified: Secondary | ICD-10-CM | POA: Diagnosis not present

## 2019-07-27 DIAGNOSIS — E119 Type 2 diabetes mellitus without complications: Secondary | ICD-10-CM | POA: Diagnosis not present

## 2019-07-27 DIAGNOSIS — Z87891 Personal history of nicotine dependence: Secondary | ICD-10-CM | POA: Diagnosis not present

## 2019-07-27 DIAGNOSIS — I10 Essential (primary) hypertension: Secondary | ICD-10-CM | POA: Diagnosis not present

## 2019-07-27 LAB — CBC WITH DIFFERENTIAL/PLATELET
Abs Immature Granulocytes: 0.02 10*3/uL (ref 0.00–0.07)
Basophils Absolute: 0.1 10*3/uL (ref 0.0–0.1)
Basophils Relative: 1 %
Eosinophils Absolute: 0.1 10*3/uL (ref 0.0–0.5)
Eosinophils Relative: 1 %
HCT: 43.3 % (ref 36.0–46.0)
Hemoglobin: 14.1 g/dL (ref 12.0–15.0)
Immature Granulocytes: 0 %
Lymphocytes Relative: 22 %
Lymphs Abs: 2.1 10*3/uL (ref 0.7–4.0)
MCH: 29.1 pg (ref 26.0–34.0)
MCHC: 32.6 g/dL (ref 30.0–36.0)
MCV: 89.3 fL (ref 80.0–100.0)
Monocytes Absolute: 0.8 10*3/uL (ref 0.1–1.0)
Monocytes Relative: 8 %
Neutro Abs: 6.4 10*3/uL (ref 1.7–7.7)
Neutrophils Relative %: 68 %
Platelets: 300 10*3/uL (ref 150–400)
RBC: 4.85 MIL/uL (ref 3.87–5.11)
RDW: 13.4 % (ref 11.5–15.5)
WBC: 9.4 10*3/uL (ref 4.0–10.5)
nRBC: 0 % (ref 0.0–0.2)

## 2019-07-27 LAB — COMPREHENSIVE METABOLIC PANEL
ALT: 51 U/L — ABNORMAL HIGH (ref 0–44)
AST: 50 U/L — ABNORMAL HIGH (ref 15–41)
Albumin: 4.1 g/dL (ref 3.5–5.0)
Alkaline Phosphatase: 79 U/L (ref 38–126)
Anion gap: 18 — ABNORMAL HIGH (ref 5–15)
BUN: 15 mg/dL (ref 6–20)
CO2: 22 mmol/L (ref 22–32)
Calcium: 9.8 mg/dL (ref 8.9–10.3)
Chloride: 95 mmol/L — ABNORMAL LOW (ref 98–111)
Creatinine, Ser: 0.98 mg/dL (ref 0.44–1.00)
GFR calc Af Amer: >60 mL/min (ref >60)
GFR calc non Af Amer: >60 mL/min (ref >60)
Glucose, Bld: 97 mg/dL (ref 70–99)
Potassium: 3.5 mmol/L (ref 3.5–5.1)
Sodium: 135 mmol/L (ref 135–145)
Total Bilirubin: 1.2 mg/dL (ref 0.3–1.2)
Total Protein: 8.6 g/dL — ABNORMAL HIGH (ref 6.5–8.1)

## 2019-07-27 LAB — URINALYSIS, ROUTINE W REFLEX MICROSCOPIC
Glucose, UA: NEGATIVE mg/dL
Hgb urine dipstick: NEGATIVE
Ketones, ur: 80 mg/dL — AB
Leukocytes,Ua: NEGATIVE
Nitrite: NEGATIVE
Protein, ur: 30 mg/dL — AB
Specific Gravity, Urine: 1.025 (ref 1.005–1.030)
pH: 6 (ref 5.0–8.0)

## 2019-07-27 LAB — LIPASE, BLOOD: Lipase: 30 U/L (ref 11–51)

## 2019-07-27 MED ORDER — SODIUM CHLORIDE 0.9 % IV BOLUS
1000.0000 mL | Freq: Once | INTRAVENOUS | Status: AC
  Administered 2019-07-27: 18:00:00 1000 mL via INTRAVENOUS

## 2019-07-27 NOTE — ED Triage Notes (Signed)
Pt presents to ED with complaints of fatigue x 2 weeks. Pt states she had gastric bypass on 12/28 and states she has not been able to eat and feels fatigue and dehydrated. Pt denies N/V/D.

## 2019-07-27 NOTE — ED Provider Notes (Signed)
[facility] EMERGENCY DEPARTMENT Provider Note   CSN: [id number] Arrival date & time: 07/27/19  1628     History Chief Complaint  Patient presents with  . Fatigue    Julie Ryan is a 54 y.o. female.  HPI      Julie Ryan is a 54 y.o. female with past medical history of diabetes, GERD, hypertension, seizures and gastric bypass surgery in December 2020 who presents to the Emergency Department complaining of generalized weakness and fatigue.  She states that since her surgery she has had a loss of appetite and early satiety.  She has been tolerating fluids without difficulty but states that she has not been able to tolerate solid foods due to her loss of appetite.  She also reports flatus, but no bowel movement in 5 days.  No relief from stool softener.  She denies fever, chills, dysuria, flank pain, vomiting and abdominal pain.  No chest pain or shortness of breath.    Past Medical History:  Diagnosis Date  . Anxiety   . Carpal tunnel syndrome of right wrist   . Chronic knee pain   . Depression   . Diabetes mellitus without complication ([place])   . Endometrial polyp 08/09/2017   will get appt with Dr Elonda Husky  . Fibroids 08/09/2017   Has multiple small fibroids   . GERD (gastroesophageal reflux disease)   . Hypertension   . Neuropathy   . Obesity   . Prediabetes   . Seizures ([place])    1 seizure as a chold; unknown etiology and has never been on meds  . Sleep apnea    did not go back to get results  . Snoring    normal/ unremakable sleep study per neurology in 12/2016    Patient Active Problem List   Diagnosis Date Noted  . Hiatal hernia 07/17/2019  . Encounter for support and coordination of transition of care 07/17/2019  . 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 Sinus tachycardia 07/17/2019  . Vertigo 12/14/2018  . Cigarette smoker motivated to quit 08/14/2018  . Back spasm 04/12/2018  . Endometrial polyp 08/09/2017  . Fibroids 08/09/2017  . Spotting  07/18/2017  . Vaginal dryness 07/18/2017  . Hyperlipidemia LDL goal <100 06/27/2017  . Trigger finger, right ring finger 06/27/2017  . Leg edema 03/08/2017  . Metabolic syndrome X 123456  . Erosive esophagitis 06/25/2013  . GERD (gastroesophageal reflux disease) 03/22/2013  . Left knee pain 03/22/2013  . Snoring 08/05/2011  . Vitamin D deficiency 03/21/2011  . Allergic rhinitis 09/20/2010  . 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 Back pain 03/17/2010  . MORBID OBESITY 02/11/2010  . Essential hypertension 02/11/2010    Past Surgical History:  Procedure Laterality Date  . APPENDECTOMY    . CARPAL TUNNEL RELEASE Right 08/01/2015   Procedure: RIGHT CARPAL TUNNEL RELEASE;  Surgeon: Carole Civil, MD;  Location: [facility];  Service: Orthopedics;  Laterality: Right;  . CERVICAL POLYPECTOMY  09/14/2017   Procedure: ENDOMETRIAL POLYPECTOMY;  Surgeon: Florian Buff, MD;  Location: [facility];  Service: Gynecology;;  . COLONOSCOPY N/A 06/15/2017   Procedure: COLONOSCOPY;  Surgeon: Rogene Houston, MD;  Location: [facility] SU[facility];  Service: Endoscopy;  Laterality: N/A;  830  . ENDOMETRIAL ABLATION N/A 09/14/2017   Procedure: ENDOMETRIAL ABLATION( [place]);  Surgeon: Florian Buff, MD;  Location: [facility];  Service: Gynecology;  Laterality: N/A;  . ESOPHAGOGASTRODUODENOSCOPY N/A 05/25/2013   Procedure: ESOPHAGOGASTRODUODENOSCOPY (EGD);  Surgeon: Rogene Houston, MD;  Location: [facility];  Service: Endoscopy;  Laterality: N/A;  220  . HYSTEROSCOPY WITH D & C N/A 09/14/2017   Procedure: DILATATION AND CURETTAGE /HYSTEROSCOPY;  Surgeon: Florian Buff, MD;  Location: [facility];  Service: Gynecology;  Laterality: N/A;  . TUBAL LIGATION       OB History    Gravida  2   Para  2   Term  2   Preterm      AB      Living  1     SAB      TAB      Ectopic      Multiple      Live Births  2           Family History  Problem Relation Age of Onset  .  Hypertension Mother   . Heart disease Mother   . Diabetes Mother   . Diabetes Sister   . Multiple sclerosis Sister   . Lupus Sister   . Heart disease Sister   . CVA Sister   . Depression Sister   . Diabetes Brother   . Hypertension Brother   . Other Son        brain tumor  . Hypertension Daughter   . Diabetes Sister     Social History   Tobacco Use  . Smoking status: Former Smoker    Packs/day: 0.25    Years: 5.00    Pack years: 1.25    Types: Cigarettes  . Smokeless tobacco: Never Used  . Tobacco comment: 5 cigarettes/ week  Substance Use Topics  . Alcohol use: No    Alcohol/week: 0.0 standard drinks  . Drug use: No    Home Medications Prior to Admission medications   Medication Sig Start Date End Date Taking? Authorizing Provider  albuterol (VENTOLIN HFA) 108 (90 Base) MCG/ACT inhaler Inhale 2 puffs into the lungs every 6 (six) hours as needed for wheezing or shortness of breath. 01/18/19   Perlie Mayo, NP  cyclobenzaprine (FLEXERIL) 10 MG tablet Take 1 tablet (10 mg total) by mouth at bedtime. 05/07/19   Fayrene Helper, MD  estradiol (ESTRACE VAGINAL) 0.1 MG/GM vaginal cream Use 1 applicator in vagina at bedtime 2-3 x weekly 07/12/19   Derrek Monaco A, NP  gabapentin (NEURONTIN) 300 MG capsule Take 1 capsule (300 mg total) by mouth at bedtime. 05/07/19   Fayrene Helper, MD  hydrochlorothiazide (MICROZIDE) 12.5 MG capsule Take 1 capsule (12.5 mg total) by mouth daily. 05/07/19   Fayrene Helper, MD  meloxicam (MOBIC) 15 MG tablet Take 1 tablet (15 mg total) by mouth daily. 05/07/19   Fayrene Helper, MD  metoprolol tartrate (LOPRESSOR) 25 MG tablet Take 1 tablet (25 mg total) by mouth 2 (two) times daily. 05/07/19   Fayrene Helper, MD  mirabegron ER (MYRBETRIQ) 25 MG TB24 tablet Take 1 tablet (25 mg total) by mouth daily. 05/07/19   Fayrene Helper, MD  pantoprazole (PROTONIX) 40 MG tablet Take 1 tablet (40 mg total) by mouth daily. 04/26/19    Fayrene Helper, MD  potassium chloride 20 MEQ/15ML (10%) SOLN Take 15 ml by mouth once daily 07/09/19   Fayrene Helper, MD  vitamin B-12 (CYANOCOBALAMIN) 500 MCG tablet Take 500 mcg by mouth daily.    [provider]  famotidine (PEPCID) 40 MG tablet Take 1 tablet (40 mg total) by mouth daily. 06/05/18 05/07/19  Fayrene Helper,  MD    Allergies    Codeine, Effexor xr [venlafaxine hcl er], Fluoxetine, Hydrocodone, and Latex  Review of Systems   Review of Systems  Constitutional: Negative for appetite change, chills and fever.  HENT: Negative for sore throat and trouble swallowing.   Respiratory: Negative for shortness of breath.   Cardiovascular: Negative for chest pain.  Gastrointestinal: Positive for constipation. Negative for abdominal pain, blood in stool, nausea and vomiting.  Genitourinary: Negative for decreased urine volume, difficulty urinating, dysuria and flank pain.  Musculoskeletal: Negative for back pain.  Skin: Negative for rash.  Neurological: Negative for dizziness, weakness and numbness.  Hematological: Negative for adenopathy.    Physical Exam Updated Vital Signs BP (!) 146/64 (BP Location: Right Arm)   Pulse 88   Temp 97.9 F (36.6 C) (Oral)   Resp 20   Ht 5\' 6"  (1.676 m)   Wt 115.7 kg   SpO2 100%   BMI 41.16 kg/m   Physical Exam Vitals and nursing note reviewed.  Constitutional:      Appearance: Normal appearance.  HENT:     Head: Normocephalic.  Eyes:     Pupils: Pupils are equal, round, and reactive to light.  Neck:     Thyroid: No thyromegaly.     Meningeal: Kernig's sign absent.  Cardiovascular:     Rate and Rhythm: Normal rate and regular rhythm.     Pulses: Normal pulses.  Pulmonary:     Effort: Pulmonary effort is normal.     Breath sounds: Normal breath sounds. No wheezing.  Abdominal:     Palpations: Abdomen is soft.     Tenderness: There is no abdominal tenderness. There is no guarding or rebound.    Musculoskeletal:        General: Normal range of motion.     Cervical back: Normal range of motion and neck supple.     Right lower leg: No edema.     Left lower leg: No edema.  Skin:    General: Skin is warm.     Capillary Refill: Capillary refill takes less than 2 seconds.     Findings: No erythema or rash.  Neurological:     General: No focal deficit present.     Mental Status: She is alert.     Sensory: No sensory deficit.     Motor: No weakness.     ED Results / Procedures / Treatments   Labs (all labs ordered are listed, but only abnormal results are displayed) Labs Reviewed - No data to display  EKG None  Radiology DG Abd 2 Views  Result Date: 07/27/2019 CLINICAL DATA:  Constipation. Additional history provided by technologist: Patient presents to ED with complaints of fatigue for 2 weeks, reported gastric bypass 1228, patient reports not being able to eat and feeling fatigued as well as dehydrated EXAM: ABDOMEN - 2 VIEW COMPARISON:  No pertinent prior studies available for comparison. FINDINGS: No dilated loops of bowel are demonstrated to suggest bowel obstruction. Moderate stool burden throughout the colon. The visualized lung bases are clear. Heart size within normal limits. No acute bony abnormality. IMPRESSION: No radiographic evidence of bowel obstruction. Moderate stool burden throughout the colon. Electronically Signed   By: Kellie Simmering DO   On: 07/27/2019 18:40    Procedures Procedures (including critical care time)  Medications Ordered in ED Medications - No data to display  ED Course  I have reviewed the triage vital signs and the nursing notes.  Pertinent labs &  imaging results that were available during my care of the patient were reviewed by me and considered in my medical decision making (see chart for details).    MDM Rules/Calculators/A&P                      Pt well appearing.  Vitals reviewed.  No concerning sx's for acute abdomen. Labs are  reassuring.  XR neg for SBO, shows constipation.  Pt agrees to increase fluid intake, OTC miralax for constipation, and PCP f/u.  Return precautions given.    Final Clinical Impression(s) / ED Diagnoses Final diagnoses:  Fatigue, unspecified type  Constipation, unspecified constipation type    Rx / DC Orders ED Discharge Orders    None       Kem Parkinson, PA-C 07/28/19 1528    Milton Ferguson, MD 07/30/19 1651

## 2019-07-27 NOTE — Discharge Instructions (Addendum)
Your x-ray today shows that you are constipated.  You can take over-the-counter MiraLAX 1 heaping capful mixed with 8 to 10 ounces of fluids twice a day until you have relief of your constipation.  Also be sure to drink plenty of water for the next 2 to 3 days.  Follow-up with your primary care provider early next week for recheck.  Return to the ER if you develop any worsening symptoms such as abdominal pain, vomiting, or fever

## 2019-08-06 ENCOUNTER — Ambulatory Visit: Payer: BC Managed Care – PPO | Admitting: Family Medicine

## 2019-08-08 ENCOUNTER — Ambulatory Visit: Payer: BC Managed Care – PPO | Admitting: Family Medicine

## 2019-08-09 ENCOUNTER — Telehealth (INDEPENDENT_AMBULATORY_CARE_PROVIDER_SITE_OTHER): Payer: BC Managed Care – PPO | Admitting: Family Medicine

## 2019-08-09 ENCOUNTER — Other Ambulatory Visit: Payer: Self-pay

## 2019-08-09 VITALS — BP 146/64 | Ht 66.0 in | Wt 240.0 lb

## 2019-08-09 DIAGNOSIS — R110 Nausea: Secondary | ICD-10-CM | POA: Diagnosis not present

## 2019-08-09 DIAGNOSIS — Z9884 Bariatric surgery status: Secondary | ICD-10-CM | POA: Diagnosis not present

## 2019-08-09 DIAGNOSIS — I10 Essential (primary) hypertension: Secondary | ICD-10-CM

## 2019-08-09 DIAGNOSIS — K219 Gastro-esophageal reflux disease without esophagitis: Secondary | ICD-10-CM | POA: Diagnosis not present

## 2019-08-09 MED ORDER — ONDANSETRON HCL 4 MG PO TABS: ORAL_TABLET | ORAL | 0 refills | Status: DC

## 2019-08-09 NOTE — Patient Instructions (Signed)
F/U in office with MD , re evaluate blood pressure and weight in 4 weeks, call if you need me sooner  Drink small amounts of liquid often so that you get in the 64 ounces daily  Zofran is prescribed for once daily use  For nausea, please let me know if you need to take this more often, if possible try taking half tablet twice daily  Congrats on weight loss, continue to increase activity slowly  Non fasting cmp and EGFr 3 days before next visit please 

## 2019-08-15 ENCOUNTER — Encounter: Payer: Self-pay | Admitting: Family Medicine

## 2019-08-15 DIAGNOSIS — R110 Nausea: Secondary | ICD-10-CM | POA: Insufficient documentation

## 2019-08-15 NOTE — Assessment & Plan Note (Signed)
Increased and uncontrolled following recent surgery, zofran prescribed

## 2019-08-15 NOTE — Assessment & Plan Note (Signed)
50 pound weight loss in 2 months, however excessive nausea

## 2019-08-15 NOTE — Assessment & Plan Note (Signed)
Elevated at last visit, will re address in office  DASH diet and commitment to daily physical activity for a minimum of 30 minutes discussed and encouraged, as a part of hypertension management. The importance of attaining a healthy weight is also discussed.  BP/Weight 08/09/2019 07/27/2019 07/17/2019 06/19/2019 05/07/2019 12/14/2018 0000000  Systolic BP 123456 [id number] 0000000 123456 [id number]  Diastolic BP 64 55 98 62 90 90 90  Wt. (Lbs) 240 255 259 289 297 280 268  BMI 38.74 41.16 41.8 46.65 47.94 45.19 43.26  Some encounter information is confidential and restricted. Go to Review Flowsheets activity to see all data.

## 2019-08-15 NOTE — Assessment & Plan Note (Signed)
Controlled, no change in medication  

## 2019-08-15 NOTE — Progress Notes (Signed)
Virtual Visit via Telephone Note  I connected with Julie Ryan on 08/15/19 at  1:20 PM EST by telephone and verified that I am speaking with the correct person using two identifiers.  Location: Patient: home Provider: office   I discussed the limitations, risks, security and privacy concerns of performing an evaluation and management service by telephone and the availability of in person appointments. I also discussed with the patient that there may be a patient responsible charge related to this service. The patient expressed understanding and agreed to proceed.   History of Present Illness: F/U chronic problems, medication review, and refill medication when necessary. Review most recent labs and order labs which are due Review preventive health and update with necessary referrals or immunizations as indicated C/o uncontrolled nausea with poor appetite following bariatric surgery over 1 month ago. Not improving and her surgeon recommend she call her PCP. Back at work for over 1 week. Denies constipation, bloating or abdominal pain   Observations/Objective: BP (!) 146/64   Ht 5\' 6"  (1.676 m)   Wt 240 lb (108.9 kg)   BMI 38.74 kg/m  Good communication with no confusion and intact memory. Alert and oriented x 3 No signs of respiratory distress during speech    Assessment and Plan: Nausea Increased and uncontrolled following recent surgery, zofran prescribed  History of Roux-en-Y gastric bypass 50 pound weight loss in 2 months, however excessive nausea  Essential hypertension Elevated at last visit, will re address in office  DASH diet and commitment to daily physical activity for a minimum of 30 minutes discussed and encouraged, as a part of hypertension management. The importance of attaining a healthy weight is also discussed.  BP/Weight 08/09/2019 07/27/2019 07/17/2019 06/19/2019 05/07/2019 12/14/2018 0000000  Systolic BP 123456 [id number] 0000000 123456 [id number]  Diastolic BP 64 55 98  62 90 90 90  Wt. (Lbs) 240 255 259 289 297 280 268  BMI 38.74 41.16 41.8 46.65 47.94 45.19 43.26  Some encounter information is confidential and restricted. Go to Review Flowsheets activity to see all data.       GERD (gastroesophageal reflux disease) Controlled, no change in medication     Follow Up Instructions:    I discussed the assessment and treatment plan with the patient. The patient was provided an opportunity to ask questions and all were answered. The patient agreed with the plan and demonstrated an understanding of the instructions.   The patient was advised to call back or seek an in-person evaluation if the symptoms worsen or if the condition fails to improve as anticipated.  I provided 11 minutes of non-face-to-face time during this encounter.   Tula Nakayama, MD

## 2019-09-06 ENCOUNTER — Encounter: Payer: Self-pay | Admitting: Family Medicine

## 2019-09-06 ENCOUNTER — Ambulatory Visit (INDEPENDENT_AMBULATORY_CARE_PROVIDER_SITE_OTHER): Payer: BC Managed Care – PPO | Admitting: Family Medicine

## 2019-09-06 ENCOUNTER — Other Ambulatory Visit: Payer: Self-pay

## 2019-09-06 VITALS — BP 146/64 | Ht 66.0 in | Wt 240.0 lb

## 2019-09-06 DIAGNOSIS — E559 Vitamin D deficiency, unspecified: Secondary | ICD-10-CM

## 2019-09-06 DIAGNOSIS — Z6838 Body mass index (BMI) 38.0-38.9, adult: Secondary | ICD-10-CM

## 2019-09-06 DIAGNOSIS — I10 Essential (primary) hypertension: Secondary | ICD-10-CM

## 2019-09-06 DIAGNOSIS — E1159 Type 2 diabetes mellitus with other circulatory complications: Secondary | ICD-10-CM

## 2019-09-06 DIAGNOSIS — M25362 Other instability, left knee: Secondary | ICD-10-CM | POA: Diagnosis not present

## 2019-09-06 DIAGNOSIS — E785 Hyperlipidemia, unspecified: Secondary | ICD-10-CM

## 2019-09-06 NOTE — Patient Instructions (Addendum)
ANNUAL PHYSICAL EXAM IN OFFICE WITH PAP END AUGUST, CALL IF YOU NEED ME SOONER  You are referred to Dr Aline Brochure re left knee instability and pain  Keep up healthy weight loss with good food choices  Fasting lipid, cmp and eGFR, HBA1C and vit D 1 week before next visit  It is important that you exercise regularly at least 30 minutes 5 times a week. If you develop chest pain, have severe difficulty breathing, or feel very tired, stop exercising immediately and seek medical attention   Think about what you will eat, plan ahead. Choose " clean, green, fresh or frozen" over canned, processed or packaged foods which are more sugary, salty and fatty. 70 to 75% of food eaten should be vegetables and fruit. Three meals at set times with snacks allowed between meals, but they must be fruit or vegetables. Aim to eat over a 12 hour period , example 7 am to 7 pm, and STOP after  your last meal of the day. Drink water,generally about 64 ounces per day, no other drink is as healthy. Fruit juice is best enjoyed in a healthy way, by EATING the fruit.   Thanks for choosing [facility], we consider it a privelige to serve you.

## 2019-09-06 NOTE — Progress Notes (Signed)
Virtual Visit via Telephone Note  I connected with Julie Ryan on 09/06/19 at  1:20 PM EST by telephone and verified that I am speaking with the correct person using two identifiers.  Location: Patient:HOME Provider: OFFICE   I discussed the limitations, risks, security and privacy concerns of performing an evaluation and management service by telephone and the availability of in person appointments. I also discussed with the patient that there may be a patient responsible charge related to this service. The patient expressed understanding and agreed to proceed.   History of Present Illness: F/U chronic problems, medication review, and refill medication when necessary. Review most recent labs and order labs which are due Review preventive health and update with necessary referrals or immunizations as indicated 3 WEEK H/O LEFT KNEE PAIN AND POPPING Improvement in nausea and now able to eat Denies recent fever or chills. Denies sinus pressure, nasal congestion, ear pain or sore throat. Denies chest congestion, productive cough or wheezing. Denies chest pains, palpitations and leg swelling Denies abdominal pain, nausea, vomiting,diarrhea or constipation.   Denies dysuria, frequency, hesitancy or incontinence. Denies headaches, seizures, numbness, or tingling. Denies depression, anxiety or insomnia. Denies skin break down or rash.       Observations/Objective: BP (!) 146/64   Ht 5\' 6"  (1.676 m)   Wt 240 lb (108.9 kg)   BMI 38.74 kg/m  Good communication with no confusion and intact memory. Alert and oriented x 3 No signs of respiratory distress during speech    Assessment and Plan: Knee instability, left 3 WEEK HISTORY OF INCREASED AND UNCONTROLLED LEFT KNEE PAIN AND INSTABILITY, REFER OTRTHO, NO AGGARVATING TRAUMA  Essential hypertension Controlled, no change in medication DASH diet and commitment to daily physical activity for a minimum of 30 minutes discussed and  encouraged, as a part of hypertension management. The importance of attaining a healthy weight is also discussed.  BP/Weight 09/06/2019 08/09/2019 07/27/2019 07/17/2019 06/19/2019 05/07/2019 [id number]  Systolic BP 123456 123456 [id number] 0000000 123456 [id number]  Diastolic BP 64 64 55 98 62 90 90  Wt. (Lbs) 240 240 255 259 289 297 280  BMI 38.74 38.74 41.16 41.8 46.65 47.94 45.19  Some encounter information is confidential and restricted. Go to Review Flowsheets activity to see all data.       MORBID OBESITY  Patient re-educated about  the importance of commitment to a  minimum of 150 minutes of exercise per week as able.  The importance of healthy food choices with portion control discussed, as well as eating regularly and within a 12 hour window most days. The need to choose "clean , green" food 50 to 75% of the time is discussed, as well as to make water the primary drink and set a goal of 64 ounces water daily.    Weight /BMI 09/06/2019 08/09/2019 07/27/2019  WEIGHT 240 lb 240 lb 255 lb  HEIGHT 5\' 6"  5\' 6"  5\' 6"   BMI 38.74 kg/m2 38.74 kg/m2 41.16 kg/m2  Some encounter information is confidential and restricted. Go to Review Flowsheets activity to see all data.        Follow Up Instructions:    I discussed the assessment and treatment plan with the patient. The patient was provided an opportunity to ask questions and all were answered. The patient agreed with the plan and demonstrated an understanding of the instructions.   The patient was advised to call back or seek an in-person evaluation if the symptoms worsen or if the condition fails to  improve as anticipated.  I provided 20 minutes of non-face-to-face time during this encounter.   Tula Nakayama, MD

## 2019-09-06 NOTE — Assessment & Plan Note (Signed)
3 WEEK HISTORY OF INCREASED AND UNCONTROLLED LEFT KNEE PAIN AND INSTABILITY, REFER OTRTHO, NO AGGARVATING TRAUMA

## 2019-09-08 ENCOUNTER — Encounter: Payer: Self-pay | Admitting: Family Medicine

## 2019-09-08 NOTE — Assessment & Plan Note (Signed)
Controlled, no change in medication DASH diet and commitment to daily physical activity for a minimum of 30 minutes discussed and encouraged, as a part of hypertension management. The importance of attaining a healthy weight is also discussed.  BP/Weight 09/06/2019 08/09/2019 07/27/2019 07/17/2019 06/19/2019 05/07/2019 [id number]  Systolic BP 123456 123456 [id number] 0000000 123456 [id number]  Diastolic BP 64 64 55 98 62 90 90  Wt. (Lbs) 240 240 255 259 289 297 280  BMI 38.74 38.74 41.16 41.8 46.65 47.94 45.19  Some encounter information is confidential and restricted. Go to Review Flowsheets activity to see all data.

## 2019-09-08 NOTE — Assessment & Plan Note (Signed)
  Patient re-educated about  the importance of commitment to a  minimum of 150 minutes of exercise per week as able.  The importance of healthy food choices with portion control discussed, as well as eating regularly and within a 12 hour window most days. The need to choose "clean , green" food 50 to 75% of the time is discussed, as well as to make water the primary drink and set a goal of 64 ounces water daily.    Weight /BMI 09/06/2019 08/09/2019 07/27/2019  WEIGHT 240 lb 240 lb 255 lb  HEIGHT 5\' 6"  5\' 6"  5\' 6"   BMI 38.74 kg/m2 38.74 kg/m2 41.16 kg/m2  Some encounter information is confidential and restricted. Go to Review Flowsheets activity to see all data.

## 2019-10-04 ENCOUNTER — Encounter: Payer: Self-pay | Admitting: Family Medicine

## 2019-10-04 ENCOUNTER — Other Ambulatory Visit: Payer: Self-pay | Admitting: *Deleted

## 2019-10-04 MED ORDER — IBUPROFEN 800 MG PO TABS: 800.0000 mg | ORAL_TABLET | Freq: Three times a day (TID) | ORAL | 0 refills | Status: DC | PRN

## 2019-10-09 ENCOUNTER — Other Ambulatory Visit: Payer: Self-pay | Admitting: Family Medicine

## 2019-10-09 MED ORDER — FUROSEMIDE 20 MG PO TABS: ORAL_TABLET | ORAL | 1 refills | Status: DC

## 2019-10-09 MED ORDER — POTASSIUM CHLORIDE CRYS ER 20 MEQ PO TBCR: EXTENDED_RELEASE_TABLET | ORAL | 1 refills | Status: DC

## 2019-10-25 ENCOUNTER — Other Ambulatory Visit: Payer: Self-pay | Admitting: Family Medicine

## 2019-10-30 ENCOUNTER — Encounter: Payer: Self-pay | Admitting: Radiology

## 2019-12-16 ENCOUNTER — Encounter: Payer: Self-pay | Admitting: Family Medicine

## 2019-12-17 ENCOUNTER — Other Ambulatory Visit: Payer: Self-pay

## 2019-12-17 ENCOUNTER — Ambulatory Visit (INDEPENDENT_AMBULATORY_CARE_PROVIDER_SITE_OTHER): Payer: BC Managed Care – PPO

## 2019-12-17 DIAGNOSIS — Z111 Encounter for screening for respiratory tuberculosis: Secondary | ICD-10-CM

## 2019-12-17 MED ORDER — CYCLOBENZAPRINE HCL 10 MG PO TABS: 10.0000 mg | ORAL_TABLET | Freq: Every day | ORAL | 1 refills | Status: DC

## 2019-12-17 MED ORDER — MELOXICAM 15 MG PO TABS: 15.0000 mg | ORAL_TABLET | Freq: Every day | ORAL | 1 refills | Status: DC

## 2019-12-17 MED ORDER — POTASSIUM CHLORIDE CRYS ER 20 MEQ PO TBCR: EXTENDED_RELEASE_TABLET | ORAL | 1 refills | Status: DC

## 2019-12-17 MED ORDER — METOPROLOL TARTRATE 25 MG PO TABS: 25.0000 mg | ORAL_TABLET | Freq: Two times a day (BID) | ORAL | 1 refills | Status: DC

## 2019-12-17 MED ORDER — GABAPENTIN 300 MG PO CAPS: 300.0000 mg | ORAL_CAPSULE | Freq: Every day | ORAL | 1 refills | Status: DC

## 2019-12-17 MED ORDER — MIRABEGRON ER 25 MG PO TB24: 25.0000 mg | ORAL_TABLET | Freq: Every day | ORAL | 1 refills | Status: DC

## 2019-12-17 MED ORDER — FUROSEMIDE 20 MG PO TABS: ORAL_TABLET | ORAL | 1 refills | Status: DC

## 2019-12-17 NOTE — Progress Notes (Signed)
PPD placed in right arm.

## 2019-12-19 ENCOUNTER — Other Ambulatory Visit: Payer: Self-pay

## 2019-12-19 LAB — TB SKIN TEST
Induration: 0 mm
TB Skin Test: NEGATIVE

## 2019-12-19 MED ORDER — GABAPENTIN 300 MG PO CAPS: 300.0000 mg | ORAL_CAPSULE | Freq: Every day | ORAL | 1 refills | Status: DC

## 2019-12-19 MED ORDER — METOPROLOL TARTRATE 25 MG PO TABS: 25.0000 mg | ORAL_TABLET | Freq: Two times a day (BID) | ORAL | 1 refills | Status: DC

## 2020-01-01 ENCOUNTER — Ambulatory Visit: Payer: BC Managed Care – PPO

## 2020-01-01 ENCOUNTER — Encounter: Payer: Self-pay | Admitting: Orthopedic Surgery

## 2020-01-01 ENCOUNTER — Other Ambulatory Visit: Payer: Self-pay

## 2020-01-01 ENCOUNTER — Ambulatory Visit (INDEPENDENT_AMBULATORY_CARE_PROVIDER_SITE_OTHER): Payer: BC Managed Care – PPO | Admitting: Orthopedic Surgery

## 2020-01-01 VITALS — BP 148/87 | HR 102 | Ht 66.0 in | Wt 218.0 lb

## 2020-01-01 DIAGNOSIS — M79645 Pain in left finger(s): Secondary | ICD-10-CM | POA: Diagnosis not present

## 2020-01-01 DIAGNOSIS — M1812 Unilateral primary osteoarthritis of first carpometacarpal joint, left hand: Secondary | ICD-10-CM

## 2020-01-01 DIAGNOSIS — G8929 Other chronic pain: Secondary | ICD-10-CM | POA: Diagnosis not present

## 2020-01-01 DIAGNOSIS — M542 Cervicalgia: Secondary | ICD-10-CM

## 2020-01-01 NOTE — Patient Instructions (Addendum)
Use Voltaren gel over the counter 4  times daily make sure you rub it in well each time you use it.   Wear splint for 6 weeks   Note not to lift heavy crates if possible

## 2020-01-01 NOTE — Progress Notes (Addendum)
NEW PROBLEM//OFFICE VISIT  Chief Complaint  Patient presents with  . Hand Pain    base of left thumb painful   . Neck Pain    into both shoulders at night/ into left thumb at night     54 year old female presents with several month history of pain in her left thumb which radiates up to her left shoulder and then across her neck into her right shoulder.  She says that she feels like her hands are weak but she does not have any trouble picking up or holding onto things.  She denies neck stiffness and denies any numbness or tingling just complains of pain over the [facility] joint of the left thumb     Review of Systems  Constitutional: Negative.   Neurological: Negative.      Past Medical History:  Diagnosis Date  . Anxiety   . Carpal tunnel syndrome of right wrist   . Chronic knee pain   . Depression   . Diabetes mellitus without complication ([place])   . Endometrial polyp 08/09/2017   will get appt with Dr Elonda Husky  . Fibroids 08/09/2017   Has multiple small fibroids   . GERD (gastroesophageal reflux disease)   . Hypertension   . Neuropathy   . Obesity   . Prediabetes   . Seizures ([place])    1 seizure as a chold; unknown etiology and has never been on meds  . Sleep apnea    did not go back to get results  . Snoring    normal/ unremakable sleep study per neurology in 12/2016    Past Surgical History:  Procedure Laterality Date  . APPENDECTOMY    . CARPAL TUNNEL RELEASE Right 08/01/2015   Procedure: RIGHT CARPAL TUNNEL RELEASE;  Surgeon: Carole Civil, MD;  Location: [facility];  Service: Orthopedics;  Laterality: Right;  . CERVICAL POLYPECTOMY  09/14/2017   Procedure: ENDOMETRIAL POLYPECTOMY;  Surgeon: Florian Buff, MD;  Location: [facility];  Service: Gynecology;;  . COLONOSCOPY N/A 06/15/2017   Procedure: COLONOSCOPY;  Surgeon: Rogene Houston, MD;  Location: [facility];  Service: Endoscopy;  Laterality: N/A;  830  . ENDOMETRIAL ABLATION N/A 09/14/2017   Procedure: ENDOMETRIAL  ABLATION( [place]);  Surgeon: Florian Buff, MD;  Location: [facility];  Service: Gynecology;  Laterality: N/A;  . ESOPHAGOGASTRODUODENOSCOPY N/A 05/25/2013   Procedure: ESOPHAGOGASTRODUODENOSCOPY (EGD);  Surgeon: Rogene Houston, MD;  Location: [facility];  Service: Endoscopy;  Laterality: N/A;  220  . HYSTEROSCOPY WITH D & C N/A 09/14/2017   Procedure: DILATATION AND CURETTAGE /HYSTEROSCOPY;  Surgeon: Florian Buff, MD;  Location: [facility];  Service: Gynecology;  Laterality: N/A;  . TUBAL LIGATION      Family History  Problem Relation Age of Onset  . Hypertension Mother   . Heart disease Mother   . Diabetes Mother   . Diabetes Sister   . Multiple sclerosis Sister   . Lupus Sister   . Heart disease Sister   . CVA Sister   . Depression Sister   . Diabetes Brother   . Hypertension Brother   . Other Son        brain tumor  . Hypertension Daughter   . Diabetes Sister    Social History   Tobacco Use  . Smoking status: Former Smoker    Packs/day: 0.25    Years: 5.00    Pack years: 1.25    Types: Cigarettes  . Smokeless tobacco: Never Used  . Tobacco  comment: 5 cigarettes/ week  Vaping Use  . Vaping Use: Never used  Substance Use Topics  . Alcohol use: No    Alcohol/week: 0.0 standard drinks  . Drug use: No    Allergies  Allergen Reactions  . Codeine Nausea Only and Other (See Comments)    Dizziness   . Effexor Xr [Venlafaxine Hcl Er] Other (See Comments)    Makes patient feel on edge   . Fluoxetine Other (See Comments)    States that med caused memory problems and crying spells   . Hydrocodone Nausea Only and Other (See Comments)    Dizziness   . Latex Itching and Rash    Current Meds  Medication Sig  . cyclobenzaprine (FLEXERIL) 10 MG tablet Take 1 tablet (10 mg total) by mouth at bedtime.  Marland Kitchen estradiol (ESTRACE VAGINAL) 0.1 MG/GM vaginal cream Use 1 applicator in vagina at bedtime 2-3 x weekly  . furosemide (LASIX) 20 MG tablet Take one tablet by mouth two times  weekly, as needed, for leg and ankle  swelling  . gabapentin (NEURONTIN) 300 MG capsule Take 1 capsule (300 mg total) by mouth at bedtime.  Marland Kitchen ibuprofen (ADVIL) 800 MG tablet Take 1 tablet (800 mg total) by mouth every 8 (eight) hours as needed.  . meloxicam (MOBIC) 15 MG tablet Take 1 tablet (15 mg total) by mouth daily.  . metoprolol tartrate (LOPRESSOR) 25 MG tablet Take 1 tablet (25 mg total) by mouth 2 (two) times daily.  . mirabegron ER (MYRBETRIQ) 25 MG TB24 tablet Take 1 tablet (25 mg total) by mouth daily.  . potassium chloride SA (KLOR-CON) 20 MEQ tablet Take one tablet by mouth two times  Weekly, as needed, when taking furosemide for leg swelling  . vitamin B-12 (CYANOCOBALAMIN) 500 MCG tablet Take 500 mcg by mouth daily.    BP (!) 148/87   Pulse (!) 102   Ht 5\' 6"  (1.676 m)   Wt 218 lb (98.9 kg)   BMI 35.19 kg/m   Physical Exam Constitutional:      General: She is not in acute distress.    Appearance: She is well-developed.  Cardiovascular:     Comments: No peripheral edema Skin:    General: Skin is warm and dry.  Neurological:     Mental Status: She is alert and oriented to person, place, and time.     Sensory: No sensory deficit.     Coordination: Coordination normal.     Gait: Gait normal.     Deep Tendon Reflexes: Reflexes are normal and symmetric.     Ortho Exam Left thumb tenderness over the [facility] joint no major crepitance pinch strength was normal remaining neurovascular exam was normal full range of motion of the wrist and hand     MEDICAL DECISION MAKING   Encounter Diagnoses  Name Primary?  . Cervicalgia Yes  . Chronic pain of left thumb   . Arthritis of carpometacarpal (CMC) joint of left thumb    Seems to have CMC arthritis of the left thumb early onset despite fairly negative x-ray  Recommend Voltaren gel secondary to patient having recent gastric bypass surgery and recommend splinting for 6 weeks with 6 weeks follow-up with options of  injection if this continues  Also patient will avoid lifting heavy crates at work as much as possible No orders of the defined types were placed in this encounter.     Arther Abbott, MD  01/01/2020 2:10 PM

## 2020-02-06 LAB — LIPID PANEL
Cholesterol: 166 mg/dL
HDL: 59 mg/dL
LDL Cholesterol (Calc): 88 mg/dL
Non-HDL Cholesterol (Calc): 107 mg/dL
Total CHOL/HDL Ratio: 2.8 (calc)
Triglycerides: 92 mg/dL

## 2020-02-06 LAB — COMPLETE METABOLIC PANEL WITH GFR
AG Ratio: 1.2 (calc) (ref 1.0–2.5)
ALT: 22 U/L (ref 6–29)
AST: 22 U/L (ref 10–35)
Albumin: 3.9 g/dL (ref 3.6–5.1)
Alkaline phosphatase (APISO): 114 U/L (ref 37–153)
BUN: 17 mg/dL (ref 7–25)
CO2: 27 mmol/L (ref 20–32)
Calcium: 9.3 mg/dL (ref 8.6–10.4)
Chloride: 103 mmol/L (ref 98–110)
Creat: 0.71 mg/dL (ref 0.50–1.05)
GFR, Est African American: 110 mL/min/{1.73_m2} (ref >=60)
GFR, Est Non African American: 95 mL/min/{1.73_m2} (ref >=60)
Globulin: 3.3 g/dL (calc) (ref 1.9–3.7)
Glucose, Bld: 90 mg/dL (ref 65–99)
Potassium: 4.1 mmol/L (ref 3.5–5.3)
Sodium: 139 mmol/L (ref 135–146)
Total Bilirubin: 0.7 mg/dL (ref 0.2–1.2)
Total Protein: 7.2 g/dL (ref 6.1–8.1)

## 2020-02-06 LAB — HEMOGLOBIN A1C
Hgb A1c MFr Bld: 5.4 % of total Hgb (ref <5.7)
Mean Plasma Glucose: 108 (calc)
eAG (mmol/L): 6 (calc)

## 2020-02-06 LAB — VITAMIN D 25 HYDROXY (VIT D DEFICIENCY, FRACTURES): Vit D, 25-Hydroxy: 35 ng/mL (ref 30–100)

## 2020-02-07 ENCOUNTER — Encounter: Payer: Self-pay | Admitting: Family Medicine

## 2020-02-07 ENCOUNTER — Other Ambulatory Visit: Payer: Self-pay

## 2020-02-07 ENCOUNTER — Ambulatory Visit (INDEPENDENT_AMBULATORY_CARE_PROVIDER_SITE_OTHER): Payer: BC Managed Care – PPO | Admitting: Family Medicine

## 2020-02-07 VITALS — BP 140/80 | HR 92 | Resp 16 | Ht 66.0 in | Wt 224.0 lb

## 2020-02-07 DIAGNOSIS — Z1211 Encounter for screening for malignant neoplasm of colon: Secondary | ICD-10-CM

## 2020-02-07 DIAGNOSIS — Z23 Encounter for immunization: Secondary | ICD-10-CM

## 2020-02-07 DIAGNOSIS — Z0000 Encounter for general adult medical examination without abnormal findings: Secondary | ICD-10-CM | POA: Diagnosis not present

## 2020-02-07 DIAGNOSIS — I10 Essential (primary) hypertension: Secondary | ICD-10-CM

## 2020-02-07 DIAGNOSIS — Z01419 Encounter for gynecological examination (general) (routine) without abnormal findings: Secondary | ICD-10-CM | POA: Diagnosis not present

## 2020-02-07 DIAGNOSIS — Z1231 Encounter for screening mammogram for malignant neoplasm of breast: Secondary | ICD-10-CM

## 2020-02-07 DIAGNOSIS — R32 Unspecified urinary incontinence: Secondary | ICD-10-CM

## 2020-02-07 NOTE — Assessment & Plan Note (Signed)
Inadequate response to oral med refer to Urology

## 2020-02-07 NOTE — Assessment & Plan Note (Signed)

## 2020-02-07 NOTE — Progress Notes (Signed)
Julie Ryan     MRN: [id number]      DOB: [birth year]  HPI: Patient is in for annual physical exam. C/o urinary incontinence despite medication Has upcoming Ortho appt re right knee Excellent weight loss following bypass Recent labs, if available are reviewed. Immunization is reviewed , and  updated    PE: BP 140/80   Pulse 92   Resp 16   Ht 5\' 6"  (1.676 m)   Wt 224 lb 0.6 oz (101.6 kg)   SpO2 96%   BMI 36.16 kg/m    Pleasant  female, alert and oriented x 3, in no cardio-pulmonary distress. Afebrile. HEENT No facial trauma or asymetry. Sinuses non tender.  Extra occullar muscles intact.. External ears normal, . Neck: supple, no adenopathy,JVD or thyromegaly.No bruits.  Chest: Clear to ascultation bilaterally.No crackles or wheezes. Non tender to palpation  Breast: Not examined, pt preference  Cardiovascular system; Heart sounds normal,  S1 and  S2 ,no S3.  No murmur, or thrill. Apical beat not displaced Peripheral pulses normal.  Abdomen: Soft, non tender, . No guarding, tenderness or rebound.   GU: Not examined, pt preference, referred to [facility]  Musculoskeletal exam: Full ROM of spine, hips , shoulders and reduced in right knees.  deformity ,swelling and  crepitus noted of right knee No muscle wasting or atrophy.   Neurologic: Cranial nerves 2 to 12 intact. Power, tone ,sensation  normal throughout.  disturbance in gait. No tremor.  Skin: Intact, no ulceration, erythema , scaling or rash noted. Pigmentation normal throughout  Psych; Normal mood and affect. Judgement and concentration normal   Assessment & Plan:  Annual physical exam Annual exam as documented. Counseling done  re healthy lifestyle involving commitment to 150 minutes exercise per week, heart healthy diet, and attaining healthy weight.The importance of adequate sleep also discussed. Regular seat belt use and home safety, is also discussed. Changes in health habits are decided  on by the patient with goals and time frames  set for achieving them. Immunization and cancer screening needs are specifically addressed at this visit.   Urinary incontinence Inadequate response to oral med refer to Urology  MORBID OBESITY  Patient re-educated about  the importance of commitment to a  minimum of 150 minutes of exercise per week as able.  The importance of healthy food choices with portion control discussed, as well as eating regularly and within a 12 hour window most days. The need to choose "clean , green" food 50 to 75% of the time is discussed, as well as to make water the primary drink and set a goal of 64 ounces water daily.    Weight /BMI 02/07/2020 01/01/2020 09/06/2019  WEIGHT 224 lb 0.6 oz 218 lb 240 lb  HEIGHT 5\' 6"  5\' 6"  5\' 6"   BMI 36.16 kg/m2 35.19 kg/m2 38.74 kg/m2  Some encounter information is confidential and restricted. Go to Review Flowsheets activity to see all data.      Essential hypertension Not at goal, no med change, with weight loss should achieve adeqquate control, re eval in 2 to 4 months DASH diet and commitment to daily physical activity for a minimum of 30 minutes discussed and encouraged, as a part of hypertension management. The importance of attaining a healthy weight is also discussed.  BP/Weight 02/07/2020 01/01/2020 09/06/2019 08/09/2019 07/27/2019 07/17/2019 71/69/6789  Systolic BP 381 017 [phone] 423  Diastolic BP 80 87 64 64 55 98 62  Wt. (Lbs) 224.04 218 240  240 255 259 289  BMI 36.16 35.19 38.74 38.74 41.16 41.8 46.65  Some encounter information is confidential and restricted. Go to Review Flowsheets activity to see all data.       Need for zoster vaccination After obtaining informed consent, the vaccine is  administered , with no adverse effect noted at the time of administration.

## 2020-02-07 NOTE — Patient Instructions (Signed)
Follow-up in office with MD in mid January call if you need me sooner.  Weight loss goal of 15 pounds   Congratulations on excellent weight loss excellent labs and improved health overall.  Please keep up the good work.  Shingrix No. 1 in office today.  Nurse appointment in 8 weeks for Shingrix No. 2.  You are referred to gynecology for your pelvic exam and Pap smear.  You are referred to urology to manage incontinence.  Please schedule your mammogram as we discussed it is due in October.  Cologuard to be arranged prior to checkout  It is important that you exercise regularly at least 30 minutes 5 times a week. If you develop chest pain, have severe difficulty breathing, or feel very tired, stop exercising immediately and seek medical attention  Think about what you will eat, plan ahead. Choose " clean, green, fresh or frozen" over canned, processed or packaged foods which are more sugary, salty and fatty. 70 to 75% of food eaten should be vegetables and fruit. Three meals at set times with snacks allowed between meals, but they must be fruit or vegetables. Aim to eat over a 12 hour period , example 7 am to 7 pm, and STOP after  your last meal of the day. Drink water,generally about 64 ounces per day, no other drink is as healthy. Fruit juice is best enjoyed in a healthy way, by EATING the fruit. Thanks for choosing [facility], we consider it a privelige to serve you.

## 2020-02-08 ENCOUNTER — Encounter: Payer: Self-pay | Admitting: Family Medicine

## 2020-02-08 DIAGNOSIS — Z23 Encounter for immunization: Secondary | ICD-10-CM | POA: Insufficient documentation

## 2020-02-08 NOTE — Assessment & Plan Note (Signed)
After obtaining informed consent, the vaccine is  administered , with no adverse effect noted at the time of administration.  

## 2020-02-08 NOTE — Assessment & Plan Note (Signed)
Not at goal, no med change, with weight loss should achieve adeqquate control, re eval in 2 to 4 months DASH diet and commitment to daily physical activity for a minimum of 30 minutes discussed and encouraged, as a part of hypertension management. The importance of attaining a healthy weight is also discussed.  BP/Weight 02/07/2020 01/01/2020 09/06/2019 08/09/2019 07/27/2019 07/17/2019 67/20/9470  Systolic BP 962 836 629 476 546 503 546  Diastolic BP 80 87 64 64 55 98 62  Wt. (Lbs) 224.04 218 240 240 255 259 289  BMI 36.16 35.19 38.74 38.74 41.16 41.8 46.65  Some encounter information is confidential and restricted. Go to Review Flowsheets activity to see all data.

## 2020-02-08 NOTE — Assessment & Plan Note (Signed)
  Patient re-educated about  the importance of commitment to a  minimum of 150 minutes of exercise per week as able.  The importance of healthy food choices with portion control discussed, as well as eating regularly and within a 12 hour window most days. The need to choose "clean , green" food 50 to 75% of the time is discussed, as well as to make water the primary drink and set a goal of 64 ounces water daily.    Weight /BMI 02/07/2020 01/01/2020 09/06/2019  WEIGHT 224 lb 0.6 oz 218 lb 240 lb  HEIGHT 5\' 6"  5\' 6"  5\' 6"   BMI 36.16 kg/m2 35.19 kg/m2 38.74 kg/m2  Some encounter information is confidential and restricted. Go to Review Flowsheets activity to see all data.

## 2020-02-12 ENCOUNTER — Encounter: Payer: Self-pay | Admitting: Orthopedic Surgery

## 2020-02-12 ENCOUNTER — Other Ambulatory Visit: Payer: Self-pay

## 2020-02-12 ENCOUNTER — Ambulatory Visit: Payer: BC Managed Care – PPO | Admitting: Orthopedic Surgery

## 2020-02-12 VITALS — BP 163/97 | HR 83 | Ht 66.0 in | Wt 220.0 lb

## 2020-02-12 DIAGNOSIS — M1812 Unilateral primary osteoarthritis of first carpometacarpal joint, left hand: Secondary | ICD-10-CM | POA: Diagnosis not present

## 2020-02-12 NOTE — Patient Instructions (Addendum)
You have received an injection of steroids into the joint. 15% of patients will have increased pain within the 24 hours postinjection.   This is transient and will go away.   We recommend that you use ice packs on the injection site for 20 minutes every 2 hours and extra strength Tylenol 2 tablets every 8 as needed until the pain resolves.  If you continue to have pain after taking the Tylenol and using the ice please call the office for further instructions.  Capzasin   Aspercreme odor free

## 2020-02-12 NOTE — Progress Notes (Signed)
Chief Complaint  Patient presents with  . Hand Pain    thumb Left painful/ having a hard time at work / works in Estate manager/land agent at [employer]    54 yo female c/o pain left thumb for 2 months, no trauma. I can't hold things. Pain is located at the base of the thumb.  She has been wearing a splint for 6 weeks, and also tried topical nasids and tylenol arthritis   ROS: MSK , knee pain // Neuro, no symptoms of nerve irritation  Past Medical History:  Diagnosis Date  . Anxiety   . Carpal tunnel syndrome of right wrist   . Chronic knee pain   . Depression   . Diabetes mellitus without complication ([place])   . Endometrial polyp 08/09/2017   will get appt with Dr Elonda Husky  . Fibroids 08/09/2017   Has multiple small fibroids   . GERD (gastroesophageal reflux disease)   . Hypertension   . Neuropathy   . Obesity   . Prediabetes   . Seizures ([place])    1 seizure as a chold; unknown etiology and has never been on meds  . Sleep apnea    did not go back to get results  . Snoring    normal/ unremakable sleep study per neurology in 12/2016   Blood pressure (!) 163/97, pulse 83, height 5\' 6"  (1.676 m), weight 220 lb (99.8 kg).  Body mass index is 35.51 kg/m.  Physical Exam Vitals and nursing note reviewed.  Constitutional:      General: She is not in acute distress.    Appearance: She is normal weight.  Musculoskeletal:     Comments: Left THUMB:  Tenderness base of left thumb  Grind positive Weak pinch  Normal rom  The thumb is stable   Skin:    General: Skin is warm and dry.     Capillary Refill: Capillary refill takes less than 2 seconds.  Neurological:     General: No focal deficit present.     Mental Status: She is alert.  Psychiatric:        Mood and Affect: Mood normal.        Thought Content: Thought content normal.       Procedure note injection left thumb  Injection left CMC joint Medication Depo-Medrol 40 mg and lidocaine 1% 2 cc The patient gave verbal consent Timeout  confirmed the site of injection left CMC joint  Alcohol and ethyl chloride was used to repair the skin and then a 25-gauge needle was used to inject the [facility] joint of the left thumb  There were no complications a sterile Band-Aid was applied  xrays last time mild OA  Chronic problem/ injection  Encounter Diagnosis  Name Primary?  Marland Kitchen Arthritis of carpometacarpal (CMC) joint of left thumb Yes

## 2020-02-26 ENCOUNTER — Other Ambulatory Visit (HOSPITAL_COMMUNITY)
Admission: RE | Admit: 2020-02-26 | Discharge: 2020-02-26 | Disposition: A | Discharge status: Home / Self Care | Payer: BC Managed Care – PPO | Source: Ambulatory Visit | Attending: Adult Health | Admitting: Adult Health

## 2020-02-26 ENCOUNTER — Encounter: Payer: Self-pay | Admitting: Adult Health

## 2020-02-26 ENCOUNTER — Ambulatory Visit (INDEPENDENT_AMBULATORY_CARE_PROVIDER_SITE_OTHER): Payer: BC Managed Care – PPO | Admitting: Adult Health

## 2020-02-26 VITALS — BP 142/69 | HR 105 | Ht 66.0 in | Wt 221.6 lb

## 2020-02-26 DIAGNOSIS — Z1211 Encounter for screening for malignant neoplasm of colon: Secondary | ICD-10-CM | POA: Insufficient documentation

## 2020-02-26 DIAGNOSIS — Z01419 Encounter for gynecological examination (general) (routine) without abnormal findings: Secondary | ICD-10-CM | POA: Diagnosis present

## 2020-02-26 LAB — HEMOCCULT GUIAC POC 1CARD (OFFICE): Fecal Occult Blood, POC: NEGATIVE

## 2020-02-26 MED ORDER — ESTRADIOL 0.1 MG/GM VA CREA: TOPICAL_CREAM | VAGINAL | 2 refills | Status: DC

## 2020-02-26 NOTE — Progress Notes (Signed)
Patient ID: Julie Ryan, female   DOB: [birth year], 54 y.o.   MRN: [id number] History of Present Illness: Julie Ryan is a 54 year old black female, divorced,PM in for pap and pelvic exam. She has lost over 50 lbs since gastric by pass. She continues to use vaginal estrace and that helps with dryness.  PCP is Dr Moshe Cipro.   Current Medications, Allergies, Past Medical History, Past Surgical History, Family History and Social History were reviewed in [employer] record.     Review of Systems: Patient denies any headaches, hearing loss, fatigue, blurred vision, shortness of breath, chest pain, abdominal pain, problems with bowel movements, urination, or intercourse(not currently active). No joint pain or mood swings.    Physical Exam:BP (!) 142/69 (BP Location: Right Arm, Patient Position: Sitting, Cuff Size: Large)   Pulse (!) 105   Ht 5\' 6"  (1.676 m)   Wt 221 lb 9.6 oz (100.5 kg)   BMI 35.77 kg/m  General:  Well developed, well nourished, no acute distress Skin:  Warm and dry Lungs; Clear to auscultation bilaterally Cardiovascular: Regular rate and rhythm Pelvic:  External genitalia is normal in appearance, no lesions.  The vagina is normal in appearance. Urethra has no lesions or masses. The cervix is bulbous.Pap with high risk HPV Genotyping performed.  Uterus is felt to be normal size, shape, and contour.  No adnexal masses or tenderness noted.Bladder is non tender, no masses felt. Rectal: Good sphincter tone, no polyps, or hemorrhoids felt.  Hemoccult negative. Extremities/musculoskeletal:  No swelling or varicosities noted, no clubbing or cyanosis Psych:  No mood changes, alert and cooperative,seems happy AA is 1 Fall risk is low PHQ 9 score is 0  Upstream - 02/26/20 1346      Pregnancy Intention Screening   Does the patient want to become pregnant in the next year? N/A    Does the patient's partner want to become pregnant in the next year? N/A    Would the  patient like to discuss contraceptive options today? N/A      Contraception Wrap Up   Current Method Female Sterilization    End Method Female Sterilization    Contraception Counseling Provided No         Examination chaperoned by Diona Fanti CMA   Impression and Plan: 1. Encounter for gynecological examination with Papanicolaou smear of cervix Pap sent Pap in 3 years if normal Physical with PCP Labs with PCP Colonoscopy per GI  2. Encounter for screening fecal occult blood testing

## 2020-02-28 ENCOUNTER — Ambulatory Visit (INDEPENDENT_AMBULATORY_CARE_PROVIDER_SITE_OTHER): Payer: BC Managed Care – PPO | Admitting: Orthopedic Surgery

## 2020-02-28 ENCOUNTER — Ambulatory Visit: Payer: BC Managed Care – PPO

## 2020-02-28 ENCOUNTER — Encounter: Payer: Self-pay | Admitting: Orthopedic Surgery

## 2020-02-28 ENCOUNTER — Other Ambulatory Visit: Payer: Self-pay

## 2020-02-28 VITALS — BP 147/81 | HR 89 | Ht 66.0 in | Wt 220.0 lb

## 2020-02-28 DIAGNOSIS — G8929 Other chronic pain: Secondary | ICD-10-CM

## 2020-02-28 DIAGNOSIS — M25561 Pain in right knee: Secondary | ICD-10-CM | POA: Diagnosis not present

## 2020-02-28 MED ORDER — TRAMADOL-ACETAMINOPHEN 37.5-325 MG PO TABS: 1.0000 | ORAL_TABLET | Freq: Three times a day (TID) | ORAL | 5 refills | Status: AC | PRN

## 2020-02-28 NOTE — Progress Notes (Addendum)
Chief Complaint  Patient presents with  . Knee Pain    right knee pain, no injury, no falls, feels like its catcing in the bend of the knee. 8/10 for pain level,      54 year old female with right knee pain and catching, with posterior pain and pulling and decreased range of motion.  She says her left knee is usually the knee that bothers her but recently the right knee has become more symptomatic  No trauma  No prior surgeries  ROS  Denies radicular symptoms back pain thigh pain, numbness or tingling does report some weakness to the right lower extremity as if it may give out  Past Medical History:  Diagnosis Date  . Anxiety   . Carpal tunnel syndrome of right wrist   . Chronic knee pain   . Depression   .    Marland Kitchen Endometrial polyp 08/09/2017   will get appt with Dr Elonda Husky  . Fibroids 08/09/2017   Has multiple small fibroids   . GERD (gastroesophageal reflux disease)   . Hypertension   . Neuropathy   . Obesity       . Seizures ([place])    1 seizure as a chold; unknown etiology and has never been on meds  . Sleep apnea    did not go back to get results  . Snoring    normal/ unremakable sleep study per neurology in 12/2016    Past Surgical History:  Procedure Laterality Date  . APPENDECTOMY    . CARPAL TUNNEL RELEASE Right 08/01/2015   Procedure: RIGHT CARPAL TUNNEL RELEASE;  Surgeon: Carole Civil, MD;  Location: [facility];  Service: Orthopedics;  Laterality: Right;  . CERVICAL POLYPECTOMY  09/14/2017   Procedure: ENDOMETRIAL POLYPECTOMY;  Surgeon: Florian Buff, MD;  Location: [facility];  Service: Gynecology;;  . COLONOSCOPY N/A 06/15/2017   Procedure: COLONOSCOPY;  Surgeon: Rogene Houston, MD;  Location: [facility];  Service: Endoscopy;  Laterality: N/A;  830  . ENDOMETRIAL ABLATION N/A 09/14/2017   Procedure: ENDOMETRIAL ABLATION( [place]);  Surgeon: Florian Buff, MD;  Location: [facility];  Service: Gynecology;  Laterality: N/A;  . ESOPHAGOGASTRODUODENOSCOPY N/A 05/25/2013    Procedure: ESOPHAGOGASTRODUODENOSCOPY (EGD);  Surgeon: Rogene Houston, MD;  Location: [facility];  Service: Endoscopy;  Laterality: N/A;  220  . GASTRIC BYPASS    . HYSTEROSCOPY WITH D & C N/A 09/14/2017   Procedure: DILATATION AND CURETTAGE /HYSTEROSCOPY;  Surgeon: Florian Buff, MD;  Location: [facility];  Service: Gynecology;  Laterality: N/A;  . TUBAL LIGATION      BP (!) 147/81   Pulse 89   Ht 5\' 6"  (1.676 m)   Wt 220 lb (99.8 kg)   BMI 35.51 kg/m   Physical Exam Constitutional:      General: She is not in acute distress.    Appearance: She is well-developed.  Cardiovascular:     Comments: No peripheral edema Skin:    General: Skin is warm and dry.  Neurological:     Mental Status: She is alert and oriented to person, place, and time.     Sensory: No sensory deficit.     Coordination: Coordination normal.     Gait: Gait normal.     Deep Tendon Reflexes: Reflexes are normal and symmetric.    Right knee shows swelling compared to the left so there is a small joint effusion the knee comes to full extension and flexes 115 degrees it feels stable in the  AP plane as well as the coronal plane there is no pain with rotation of the joint of the McMurray's maneuver her muscle tone and strength are normal  Gait is unremarkable with no significant limping   Procedure note right knee injection   verbal consent was obtained to inject right knee joint  Timeout was completed to confirm the site of injection  The medications used were 40 mg of Depo-Medrol and 1% lidocaine 3 cc  Anesthesia was provided by ethyl chloride and the skin was prepped with alcohol.  After cleaning the skin with alcohol a 20-gauge needle was used to inject the right knee joint. There were no complications. A sterile bandage was applied.  X-rays both knees show significant wear on the medial side she is in varus in both knees lateral x-ray shows the joint degeneration even better of the medial  compartment  The patellofemoral joint shows mild narrowing laterally no osteophytes  Chronic with progression, injection, medication prescription.

## 2020-02-28 NOTE — Patient Instructions (Signed)

## 2020-02-29 LAB — CYTOLOGY - PAP
Comment: NEGATIVE
Diagnosis: NEGATIVE
High risk HPV: NEGATIVE

## 2020-03-31 ENCOUNTER — Other Ambulatory Visit: Payer: Self-pay

## 2020-03-31 ENCOUNTER — Ambulatory Visit (INDEPENDENT_AMBULATORY_CARE_PROVIDER_SITE_OTHER): Payer: BC Managed Care – PPO | Admitting: Orthopedic Surgery

## 2020-03-31 VITALS — Ht 66.0 in | Wt 220.0 lb

## 2020-03-31 DIAGNOSIS — M23321 Other meniscus derangements, posterior horn of medial meniscus, right knee: Secondary | ICD-10-CM

## 2020-03-31 DIAGNOSIS — G8929 Other chronic pain: Secondary | ICD-10-CM

## 2020-03-31 NOTE — Patient Instructions (Signed)
MELOXICAM/ TYLENOL ARTHRITIS   ICE   ACTIVITY LIMIT TO TOLERABLE   MRI SCHEDULE

## 2020-03-31 NOTE — Progress Notes (Signed)
FOLLOW UP   Chief Complaint  Patient presents with  . Follow-up    Recheck on right knee.    Encounter Diagnosis  Name Primary?  . Chronic pain of right knee Yes   History and presentation  54 year old female with right knee pain and catching, with posterior pain and pulling and decreased range of motion.  She says her left knee is usually the knee that bothers her but recently the right knee has become more symptomatic   Today  The patient says the medication meloxicam plus injection activity modification not helping pain is worse knee is more swollen feels like the knee is giving out  Right knee exam I could not tell if she had real fluid although the knee.  Looks bigger than the right she felt tightness at 90 degrees flexion medial joint line tenderness and pain and McMurray's sign positive  Recommend MRI right knee continue activity modification anti-inflammatories  Follow-up after MRI

## 2020-04-01 ENCOUNTER — Ambulatory Visit: Payer: BC Managed Care – PPO | Admitting: Urology

## 2020-04-11 ENCOUNTER — Other Ambulatory Visit: Payer: Self-pay

## 2020-04-11 ENCOUNTER — Ambulatory Visit (HOSPITAL_COMMUNITY)
Admission: RE | Admit: 2020-04-11 | Discharge: 2020-04-11 | Disposition: A | Discharge status: Home / Self Care | Payer: BC Managed Care – PPO | Source: Ambulatory Visit | Attending: Orthopedic Surgery | Admitting: Orthopedic Surgery

## 2020-04-11 DIAGNOSIS — G8929 Other chronic pain: Secondary | ICD-10-CM

## 2020-04-11 DIAGNOSIS — M25561 Pain in right knee: Secondary | ICD-10-CM | POA: Insufficient documentation

## 2020-04-16 ENCOUNTER — Ambulatory Visit (INDEPENDENT_AMBULATORY_CARE_PROVIDER_SITE_OTHER): Payer: BC Managed Care – PPO | Admitting: Orthopedic Surgery

## 2020-04-16 ENCOUNTER — Other Ambulatory Visit: Payer: Self-pay

## 2020-04-16 ENCOUNTER — Encounter: Payer: Self-pay | Admitting: Orthopedic Surgery

## 2020-04-16 VITALS — BP 151/91 | HR 97 | Ht 66.0 in | Wt 218.0 lb

## 2020-04-16 DIAGNOSIS — G8929 Other chronic pain: Secondary | ICD-10-CM

## 2020-04-16 DIAGNOSIS — M1711 Unilateral primary osteoarthritis, right knee: Secondary | ICD-10-CM

## 2020-04-16 DIAGNOSIS — M1710 Unilateral primary osteoarthritis, unspecified knee: Secondary | ICD-10-CM

## 2020-04-16 MED ORDER — DICLOFENAC SODIUM 75 MG PO TBEC: 75.0000 mg | DELAYED_RELEASE_TABLET | Freq: Two times a day (BID) | ORAL | 2 refills | Status: DC

## 2020-04-16 MED ORDER — ACETAMINOPHEN-CODEINE #3 300-30 MG PO TABS: 1.0000 | ORAL_TABLET | ORAL | 0 refills | Status: DC | PRN

## 2020-04-16 NOTE — Progress Notes (Signed)
Chief Complaint  Patient presents with  . Knee Pain    getting worse cant sleep at night   . Results    review R knee MRI     54 year old female complains of 10 out of 10 right knee pain she had 1 injection in the right knee and she is on tramadol and meloxicam  She had an MRI of the knee for progressive pain  I read the MRI she has degenerative arthritis all 3 compartments she has a degenerative free edge medial meniscus tear with extrusion  In this setting degenerative meniscal tears no benefit from surgery  Continue nonoperative treatment with weight loss exercise medication changes   Meds ordered this encounter  Medications  . diclofenac (VOLTAREN) 75 MG EC tablet    Sig: Take 1 tablet (75 mg total) by mouth 2 (two) times daily with a meal.    Dispense:  60 tablet    Refill:  2  . acetaminophen-codeine (TYLENOL #3) 300-30 MG tablet    Sig: Take 1 tablet by mouth every 4 (four) hours as needed for up to 5 days for moderate pain.    Dispense:  30 tablet    Refill:  0    I think she will benefit from hyaluronic acid injection and or platelet rich plasma  Follow-up in 3 months   MRI right knee report IMPRESSION: 1. Degenerative free edge tearing of the posterior horn and body of the medial meniscus with associated partial extrusion of the meniscus from the joint. The meniscal root appears intact. 2. Underlying moderate medial compartment osteoarthritis. No acute osseous findings. 3. Small mildly complex joint effusion and small Baker's cyst. 4. Patella alta.   Electronically Signed   By: Richardean Sale M.D.   On: 04/13/2020 11:20   Body mass index is 35.19 kg/m.

## 2020-04-16 NOTE — Patient Instructions (Signed)
Stop taking tramadol and meloxicam  Start diclofenac and codeine   Try weight loss

## 2020-04-17 ENCOUNTER — Encounter (HOSPITAL_COMMUNITY): Payer: Self-pay | Admitting: Emergency Medicine

## 2020-04-17 ENCOUNTER — Emergency Department (HOSPITAL_COMMUNITY): Payer: BC Managed Care – PPO

## 2020-04-17 ENCOUNTER — Emergency Department (HOSPITAL_COMMUNITY)
Admission: EM | Admit: 2020-04-17 | Discharge: 2020-04-17 | Disposition: A | Discharge status: Home / Self Care | Payer: BC Managed Care – PPO | Attending: Emergency Medicine | Admitting: Emergency Medicine

## 2020-04-17 DIAGNOSIS — Z91040 Latex allergy status: Secondary | ICD-10-CM | POA: Insufficient documentation

## 2020-04-17 DIAGNOSIS — E1140 Type 2 diabetes mellitus with diabetic neuropathy, unspecified: Secondary | ICD-10-CM | POA: Diagnosis not present

## 2020-04-17 DIAGNOSIS — M79661 Pain in right lower leg: Secondary | ICD-10-CM | POA: Insufficient documentation

## 2020-04-17 DIAGNOSIS — I10 Essential (primary) hypertension: Secondary | ICD-10-CM | POA: Insufficient documentation

## 2020-04-17 DIAGNOSIS — Z87891 Personal history of nicotine dependence: Secondary | ICD-10-CM | POA: Diagnosis not present

## 2020-04-17 DIAGNOSIS — M7989 Other specified soft tissue disorders: Secondary | ICD-10-CM

## 2020-04-17 NOTE — Discharge Instructions (Signed)
You were seen in the ED today with right leg pain. Your ultrasound did not show a blood clot in the leg. Please go to your pharmacy or medical supply [facility] and purchase either an ACE wrap or compression stocking to use at home. Keep your legs elevated and apply a cool compress as needed. This should reduce your pain symptoms and swelling but please involve your primary care doctor who can schedule additional testing as needed. Return to the ED with any worsening pain, numbness, chest pain, or trouble breathing.

## 2020-04-17 NOTE — ED Triage Notes (Signed)
Patient has been seeing Dr. Aline Brochure for right knee pain.  Patient states tightness in her lower leg started last night.

## 2020-04-17 NOTE — ED Provider Notes (Signed)
Emergency Department Provider Note   I have reviewed the triage vital signs and the nursing notes.   HISTORY  Chief Complaint No chief complaint on file.   HPI Julie Ryan is a 54 y.o. female with PMH reviewed below presents to the emergency department for evaluation of right leg pain, swelling, tightness.  Symptoms began overnight without injury or other clear provoking factors.  Pain radiates from just below the right knee down through the calf.  She denies associated shortness of breath or chest pain.  She has degenerative changes in both knees and has trouble getting around at baseline but states that she has been pushing herself to stay as active as she can.  She denies injury fevers.  No similar symptoms in the past.   Past Medical History:  Diagnosis Date  . Anxiety   . Carpal tunnel syndrome of right wrist   . Chronic knee pain   . Depression   . Diabetes mellitus without complication ([place])   . Endometrial polyp 08/09/2017   will get appt with Dr Elonda Husky  . Fibroids 08/09/2017   Has multiple small fibroids   . GERD (gastroesophageal reflux disease)   . Hypertension   . Neuropathy   . Obesity   . Prediabetes   . Seizures ([place])    1 seizure as a chold; unknown etiology and has never been on meds  . Sleep apnea    did not go back to get results  . Snoring    normal/ unremakable sleep study per neurology in 12/2016    Patient Active Problem List   Diagnosis Date Noted  . Encounter for screening fecal occult blood testing 02/26/2020  . Encounter for gynecological examination with Papanicolaou smear of cervix 02/26/2020  . Need for zoster vaccination 02/08/2020  . Knee instability, left 09/06/2019  . Hiatal hernia 07/17/2019  . 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 Back spasm 04/12/2018  . Endometrial polyp 08/09/2017  . Fibroids 08/09/2017  . Vaginal dryness 07/18/2017  . Trigger finger, right ring finger 06/27/2017  . Leg edema 03/08/2017  .  Metabolic syndrome X 44/07/270  . Erosive esophagitis 06/25/2013  . Annual physical exam 03/25/2013  . GERD (gastroesophageal reflux disease) 03/22/2013  . Left knee pain 03/22/2013  . Snoring 08/05/2011  . Urinary incontinence 08/05/2011  . Vitamin D deficiency 03/21/2011  . Allergic rhinitis 09/20/2010  . 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 Back pain 03/17/2010  . MORBID OBESITY 02/11/2010  . Essential hypertension 02/11/2010    Past Surgical History:  Procedure Laterality Date  . APPENDECTOMY    . CARPAL TUNNEL RELEASE Right 08/01/2015   Procedure: RIGHT CARPAL TUNNEL RELEASE;  Surgeon: Carole Civil, MD;  Location: [facility];  Service: Orthopedics;  Laterality: Right;  . CERVICAL POLYPECTOMY  09/14/2017   Procedure: ENDOMETRIAL POLYPECTOMY;  Surgeon: Florian Buff, MD;  Location: [facility];  Service: Gynecology;;  . COLONOSCOPY N/A 06/15/2017   Procedure: COLONOSCOPY;  Surgeon: Rogene Houston, MD;  Location: [facility];  Service: Endoscopy;  Laterality: N/A;  830  . ENDOMETRIAL ABLATION N/A 09/14/2017   Procedure: ENDOMETRIAL ABLATION( [place]);  Surgeon: Florian Buff, MD;  Location: [facility];  Service: Gynecology;  Laterality: N/A;  . ESOPHAGOGASTRODUODENOSCOPY N/A 05/25/2013   Procedure: ESOPHAGOGASTRODUODENOSCOPY (EGD);  Surgeon: Rogene Houston, MD;  Location: [facility];  Service: Endoscopy;  Laterality: N/A;  220  . GASTRIC BYPASS    . HYSTEROSCOPY WITH  D & C N/A 09/14/2017   Procedure: DILATATION AND CURETTAGE /HYSTEROSCOPY;  Surgeon: Florian Buff, MD;  Location: [facility];  Service: Gynecology;  Laterality: N/A;  . TUBAL LIGATION      Allergies Codeine, Effexor xr [venlafaxine hcl er], Fluoxetine, Hydrocodone, and Latex  Family History  Problem Relation Age of Onset  . Hypertension Mother   . Heart disease Mother   . Diabetes Mother   . Diabetes Sister   . Multiple sclerosis Sister   . Lupus Sister   . Heart disease Sister     . CVA Sister   . Depression Sister   . Diabetes Brother   . Hypertension Brother   . Other Son        brain tumor  . Hypertension Daughter   . Diabetes Sister     Social History Social History   Tobacco Use  . Smoking status: Former Smoker    Packs/day: 0.25    Years: 5.00    Pack years: 1.25    Types: Cigarettes  . Smokeless tobacco: Never Used  . Tobacco comment: 5 cigarettes/ week  Vaping Use  . Vaping Use: Never used  Substance Use Topics  . Alcohol use: No    Alcohol/week: 0.0 standard drinks  . Drug use: No    Review of Systems  Constitutional: No fever/chills Cardiovascular: Denies chest pain. Respiratory: Denies shortness of breath. Gastrointestinal: No abdominal pain.  Musculoskeletal: Positive right leg pain.  Skin: Negative for rash. Neurological: Negative for weakness or numbness.  10-point ROS otherwise negative.  ____________________________________________   PHYSICAL EXAM:  VITAL SIGNS: ED Triage Vitals  Enc Vitals Group     BP 04/17/20 1005 139/87     Pulse Rate 04/17/20 1005 77     Resp 04/17/20 1005 20     Temp 04/17/20 1005 98 F (36.7 C)     Temp Source 04/17/20 1005 Oral     SpO2 04/17/20 1005 99 %     Weight 04/17/20 1006 218 lb (98.9 kg)     Height 04/17/20 1006 5\' 6"  (1.676 m)   Constitutional: Alert and oriented. Well appearing and in no acute distress. Eyes: Conjunctivae are normal. Head: Atraumatic. Nose: No congestion/rhinnorhea. Mouth/Throat: Mucous membranes are moist.   Neck: No stridor.   Cardiovascular: Normal rate, regular rhythm.  Respiratory: Normal respiratory effort.  Gastrointestinal: No distention.  Musculoskeletal: Mild edema over the right calf compared to the left with some mild popliteal tenderness. Normal strength and sensation in the RLE. Normal pulses in the right foot.  Neurologic:  Normal speech and language. Normal sensation in the RLE.  Skin:  Skin is warm, dry and intact. No rash  noted.  ____________________________________________  RADIOLOGY  US Venous Img Lower Right (DVT Study)  Result Date: 04/17/2020 CLINICAL DATA:  Right lower extremity pain and edema. EXAM: RIGHT LOWER EXTREMITY VENOUS DOPPLER ULTRASOUND TECHNIQUE: Gray-scale sonography with graded compression, as well as color Doppler and duplex ultrasound were performed to evaluate the lower extremity deep venous systems from the level of the common femoral vein and including the common femoral, femoral, profunda femoral, popliteal and calf veins including the posterior tibial, peroneal and gastrocnemius veins when visible. The superficial great saphenous vein was also interrogated. Spectral Doppler was utilized to evaluate flow at rest and with distal augmentation maneuvers in the common femoral, femoral and popliteal veins. COMPARISON:  None. FINDINGS: Contralateral Common Femoral Vein: Respiratory phasicity is normal and symmetric with the symptomatic side. No evidence of thrombus. Normal  compressibility. Common Femoral Vein: No evidence of thrombus. Normal compressibility, respiratory phasicity and response to augmentation. Saphenofemoral Junction: No evidence of thrombus. Normal compressibility and flow on color Doppler imaging. Profunda Femoral Vein: No evidence of thrombus. Normal compressibility and flow on color Doppler imaging. Femoral Vein: No evidence of thrombus. Normal compressibility, respiratory phasicity and response to augmentation. Popliteal Vein: No evidence of thrombus. Normal compressibility, respiratory phasicity and response to augmentation. Calf Veins: No evidence of thrombus. Normal compressibility and flow on color Doppler imaging. Superficial Great Saphenous Vein: No evidence of thrombus. Normal compressibility. Venous Reflux:  None. Other Findings: No evidence of superficial thrombophlebitis or abnormal fluid collection. IMPRESSION: No evidence of right lower extremity deep venous thrombosis.  Electronically Signed   By: Aletta Edouard M.D.   On: 04/17/2020 13:17    ____________________________________________   PROCEDURES  Procedure(s) performed:   Procedures  None  ____________________________________________   INITIAL IMPRESSION / ASSESSMENT AND PLAN / ED COURSE  Pertinent labs & imaging results that were available during my care of the patient were reviewed by me and considered in my medical decision making (see chart for details).   Patient presents to the emergency department valuation of right leg pain with some swelling.  No injury.  I reviewed the recent MRI results of the right knee.  Patient is followed by Dr. Aline Brochure with orthopedics.  Doubt this is flare of the patient's arthritis given some swelling.  Will need DVT rule out ultrasound of the right lower extremity which was ordered.  No chest pain or shortness of breath to suspect concurrent PE. Exam not consistent with compartment syndrome or cellulitis.   No evidence of DVT on [place] after review. Plan for RICE at home and close PCP follow up. Discussed ED return precautions.  ____________________________________________  FINAL CLINICAL IMPRESSION(S) / ED DIAGNOSES  Final diagnoses:  Pain and swelling of lower leg, right    Note:  This document was prepared using Dragon voice recognition software and may include unintentional dictation errors.  Nanda Quinton, MD, [facility] Emergency Medicine    Opie Maclaughlin, [facility], MD 04/18/20 [phone]

## 2020-04-19 ENCOUNTER — Other Ambulatory Visit: Payer: Self-pay | Admitting: Family Medicine

## 2020-04-29 ENCOUNTER — Telehealth: Payer: Self-pay | Admitting: Orthopedic Surgery

## 2020-04-29 DIAGNOSIS — G8929 Other chronic pain: Secondary | ICD-10-CM

## 2020-04-29 NOTE — Telephone Encounter (Signed)
SORRY CAN HAVE ANYTHING STRONGER WITHOUT GOING TO PAIN CLINIC

## 2020-04-29 NOTE — Telephone Encounter (Signed)
Patient called and requested something stronger than Tylenol #3 for pain  She uses [facility] on [address]

## 2020-04-29 NOTE — Telephone Encounter (Signed)
Left message for her to call back

## 2020-04-30 NOTE — Telephone Encounter (Signed)
Called again Left message I will call her back this afternoon

## 2020-04-30 NOTE — Telephone Encounter (Signed)
Spoke to her, she agrees to it, have put in referral

## 2020-05-01 ENCOUNTER — Other Ambulatory Visit: Payer: Self-pay

## 2020-05-01 ENCOUNTER — Ambulatory Visit (INDEPENDENT_AMBULATORY_CARE_PROVIDER_SITE_OTHER): Payer: BC Managed Care – PPO

## 2020-05-01 DIAGNOSIS — Z23 Encounter for immunization: Secondary | ICD-10-CM | POA: Diagnosis not present

## 2020-05-13 ENCOUNTER — Encounter: Payer: Self-pay | Admitting: Urology

## 2020-05-13 ENCOUNTER — Other Ambulatory Visit: Payer: Self-pay

## 2020-05-13 ENCOUNTER — Telehealth: Payer: Self-pay | Admitting: Radiology

## 2020-05-13 ENCOUNTER — Ambulatory Visit (INDEPENDENT_AMBULATORY_CARE_PROVIDER_SITE_OTHER): Payer: BC Managed Care – PPO | Admitting: Urology

## 2020-05-13 VITALS — BP 152/79 | HR 80 | Temp 98.4°F | Ht 66.0 in | Wt 218.0 lb

## 2020-05-13 DIAGNOSIS — N3281 Overactive bladder: Secondary | ICD-10-CM | POA: Diagnosis not present

## 2020-05-13 DIAGNOSIS — R32 Unspecified urinary incontinence: Secondary | ICD-10-CM | POA: Diagnosis not present

## 2020-05-13 LAB — URINALYSIS, ROUTINE W REFLEX MICROSCOPIC
Bilirubin, UA: NEGATIVE
Glucose, UA: NEGATIVE
Leukocytes,UA: NEGATIVE
Nitrite, UA: POSITIVE — AB
Protein,UA: NEGATIVE
RBC, UA: NEGATIVE
Specific Gravity, UA: 1.025 (ref 1.005–1.030)
Urobilinogen, Ur: 1 mg/dL (ref 0.2–1.0)
pH, UA: 6 (ref 5.0–7.5)

## 2020-05-13 LAB — MICROSCOPIC EXAMINATION: Renal Epithel, UA: NONE SEEN /hpf

## 2020-05-13 MED ORDER — SOLIFENACIN SUCCINATE 10 MG PO TABS: 10.0000 mg | ORAL_TABLET | Freq: Every day | ORAL | 11 refills | Status: DC

## 2020-05-13 NOTE — Progress Notes (Signed)
H&P  Chief Complaint: Urinary Incontinence - NP referral  History of Present Illness: Julie Ryan is a 54 y.o. year old female new patient here for referral of her urinary incontinence. Her symptoms presented approximately 3 years ago and she was placed on myrbetriq at that time. She reports a mix of SUI and UUI (leaning towards UUI) but does not wear incontinence pads at this time. She denies increased urinary frequency, recent UTIs, or constipation. She feels that she is able to empty her bladder completely and has a good FOS. She denies any history of kidney stones.  Past Medical History:  Diagnosis Date  . Anxiety   . Carpal tunnel syndrome of right wrist   . Chronic knee pain   . Depression   . Diabetes mellitus without complication ([place])   . Endometrial polyp 08/09/2017   will get appt with Dr Elonda Husky  . Fibroids 08/09/2017   Has multiple small fibroids   . GERD (gastroesophageal reflux disease)   . Hypertension   . Neuropathy   . Obesity   . Prediabetes   . Seizures ([place])    1 seizure as a chold; unknown etiology and has never been on meds  . Sleep apnea    did not go back to get results  . Snoring    normal/ unremakable sleep study per neurology in 12/2016    Past Surgical History:  Procedure Laterality Date  . APPENDECTOMY    . CARPAL TUNNEL RELEASE Right 08/01/2015   Procedure: RIGHT CARPAL TUNNEL RELEASE;  Surgeon: Carole Civil, MD;  Location: [facility];  Service: Orthopedics;  Laterality: Right;  . CERVICAL POLYPECTOMY  09/14/2017   Procedure: ENDOMETRIAL POLYPECTOMY;  Surgeon: Florian Buff, MD;  Location: [facility];  Service: Gynecology;;  . COLONOSCOPY N/A 06/15/2017   Procedure: COLONOSCOPY;  Surgeon: Rogene Houston, MD;  [facility];  Service: Endoscopy;  Laterality: N/A;  830  . ENDOMETRIAL ABLATION N/A 09/14/2017   Procedure: ENDOMETRIAL ABLATION( [place]);  Surgeon: Florian Buff, MD;  Location: [facility];  Service: Gynecology;  Laterality: N/A;  .  ESOPHAGOGASTRODUODENOSCOPY N/A 05/25/2013   Procedure: ESOPHAGOGASTRODUODENOSCOPY (EGD);  Surgeon: Rogene Houston, MD;  [facility];  Service: Endoscopy;  Laterality: N/A;  220  . GASTRIC BYPASS    . HYSTEROSCOPY WITH D & C N/A 09/14/2017   Procedure: DILATATION AND CURETTAGE /HYSTEROSCOPY;  Surgeon: Florian Buff, MD;  Location: [facility];  Service: Gynecology;  Laterality: N/A;  . TUBAL LIGATION      Home Medications:  (Not in a hospital admission)   Allergies:  Allergies  Allergen Reactions  . Codeine Nausea Only and Other (See Comments)    Dizziness   . Effexor Xr [Venlafaxine Hcl Er] Other (See Comments)    Makes patient feel on edge   . Fluoxetine Other (See Comments)    States that med caused memory problems and crying spells   . Hydrocodone Nausea Only and Other (See Comments)    Dizziness   . Latex Itching and Rash    Family History  Problem Relation Age of Onset  . Hypertension Mother   . Heart disease Mother   . Diabetes Mother   . Diabetes Sister   . Multiple sclerosis Sister   . Lupus Sister   . Heart disease Sister   . CVA Sister   . Depression Sister   . Diabetes Brother   . Hypertension Brother   . Other Son  brain tumor  . Hypertension Daughter   . Diabetes Sister     Social History:  reports that she has quit smoking. Her smoking use included cigarettes. She has a 1.25 pack-year smoking history. She has never used smokeless tobacco. She reports that she does not drink alcohol and does not use drugs.  ROS: A complete review of systems was performed.  All systems are negative except for pertinent findings as noted.  Physical Exam:  Vital signs in last 24 hours: BP: ()/()  Arterial Line BP: ()/()  General:  Alert and oriented, No acute distress HEENT: Normocephalic, atraumatic Neck: No JVD Extremities: No edema Neurologic: Grossly intact  I have reviewed prior pt notes  I have reviewed notes from referring/previous  physicians  I have reviewed urinalysis results    Impression/Assessment:  OAB -w/ mixed urinary incontinence. Sx's bothersome despite Myrbetriq (lower dose). Med a bit expensive for her--she requests alternative as well as non-medical Rx.   Plan:  1. Pt provided with and oriented to OAB worksheet for self-help measures   2. Pt started on solifenacin 10 mg daily and advised regarding dosage and side-effects (should be less expensive).  3. F/U in 3 months for OV and symptom recheck.  CC: Dr. Tula Nakayama  [name] 05/13/2020, 9:18 AM  Lillette Boxer. Modena Bellemare MD

## 2020-05-13 NOTE — Progress Notes (Signed)
Urological Symptom Review  Patient is experiencing the following symptoms: Get up at night to urinate Leakage of urine   Review of Systems  Gastrointestinal (upper)  : Negative for upper GI symptoms  Gastrointestinal (lower) : Negative for lower GI symptoms  Constitutional : Negative for symptoms  Skin: Negative for skin symptoms  Eyes: Negative for eye symptoms  Ear/Nose/Throat : Negative for Ear/Nose/Throat symptoms  Hematologic/Lymphatic: Negative for Hematologic/Lymphatic symptoms  Cardiovascular : Leg swelling  Respiratory : Negative for respiratory symptoms  Endocrine: Negative for endocrine symptoms  Musculoskeletal: Negative for musculoskeletal symptoms  Neurological: Negative for neurological symptoms  Psychologic: Depression Anxiety

## 2020-05-13 NOTE — Telephone Encounter (Signed)
Completed prior auth on cover my meds for the [facility]

## 2020-05-13 NOTE — Addendum Note (Signed)
Addended by: Valentina Lucks on: 05/13/2020 01:53 PM   Modules accepted: Orders

## 2020-05-15 ENCOUNTER — Ambulatory Visit: Payer: BC Managed Care – PPO

## 2020-05-16 LAB — CULTURE, URINE COMPREHENSIVE

## 2020-05-20 ENCOUNTER — Other Ambulatory Visit: Payer: Self-pay | Admitting: Urology

## 2020-05-20 ENCOUNTER — Telehealth: Payer: Self-pay

## 2020-05-20 DIAGNOSIS — N3000 Acute cystitis without hematuria: Secondary | ICD-10-CM

## 2020-05-20 MED ORDER — SULFAMETHOXAZOLE-TRIMETHOPRIM 800-160 MG PO TABS: 1.0000 | ORAL_TABLET | Freq: Two times a day (BID) | ORAL | 0 refills | Status: DC

## 2020-05-20 NOTE — Telephone Encounter (Signed)
-----   Message from Franchot Gallo, MD sent at 05/20/2020  8:59 AM EST ----- Notify pt--culture grew bacteria--I sent abx in ----- Message ----- From: Iris Pert, LPN Sent: [id number]   8:40 AM EST To: Franchot Gallo, MD  Please review

## 2020-05-20 NOTE — Telephone Encounter (Signed)
Lft msg to call back about cx and abx.

## 2020-05-21 NOTE — Telephone Encounter (Signed)
Pt called and made aware

## 2020-05-21 NOTE — Telephone Encounter (Signed)
-----   Message from Franchot Gallo, MD sent at 05/20/2020  8:59 AM EST ----- Notify pt--culture grew bacteria--I sent abx in ----- Message ----- From: Iris Pert, LPN Sent: [id number]/[phone]   8:40 AM EST To: Franchot Gallo, MD  Please review

## 2020-05-22 ENCOUNTER — Telehealth: Payer: Self-pay | Admitting: Family Medicine

## 2020-05-22 NOTE — Telephone Encounter (Signed)
s schedule appt with me in December , follow up 9 thanks

## 2020-06-19 ENCOUNTER — Other Ambulatory Visit: Payer: Self-pay | Admitting: Family Medicine

## 2020-06-26 ENCOUNTER — Other Ambulatory Visit: Payer: Self-pay

## 2020-06-26 ENCOUNTER — Ambulatory Visit: Payer: BC Managed Care – PPO | Admitting: Family Medicine

## 2020-06-26 ENCOUNTER — Encounter: Payer: Self-pay | Admitting: Family Medicine

## 2020-06-26 VITALS — BP 143/86 | HR 88 | Resp 16 | Ht 66.0 in | Wt 218.1 lb

## 2020-06-26 DIAGNOSIS — Z23 Encounter for immunization: Secondary | ICD-10-CM | POA: Diagnosis not present

## 2020-06-26 DIAGNOSIS — B369 Superficial mycosis, unspecified: Secondary | ICD-10-CM

## 2020-06-26 DIAGNOSIS — I10 Essential (primary) hypertension: Secondary | ICD-10-CM | POA: Diagnosis not present

## 2020-06-26 DIAGNOSIS — R768 Other specified abnormal immunological findings in serum: Secondary | ICD-10-CM

## 2020-06-26 DIAGNOSIS — M20009 Unspecified deformity of unspecified finger(s): Secondary | ICD-10-CM

## 2020-06-26 MED ORDER — CLOTRIMAZOLE-BETAMETHASONE 1-0.05 % EX CREA: TOPICAL_CREAM | CUTANEOUS | 0 refills | Status: DC

## 2020-06-26 MED ORDER — AMLODIPINE BESYLATE 5 MG PO TABS: 5.0000 mg | ORAL_TABLET | Freq: Every day | ORAL | 3 refills | Status: DC

## 2020-06-26 NOTE — Patient Instructions (Addendum)
Follow-up as before call if you need me sooner.  New for blood pressure is amlodipine 5 mg 1 daily.  New for rash in the elbow joints is clotrimazole betamethasone cream use as directed.  You are referred to rheumatologist for the abnormal blood work with deformity of the joints of your hands.  Flu vaccine in office today.  Covid booster is due December 30 or after.  Please ensure that you get this.  It is important that you exercise regularly at least 30 minutes 5 times a week. If you develop chest pain, have severe difficulty breathing, or feel very tired, stop exercising immediately and seek medical attention  Think about what you will eat, plan ahead. Choose " clean, green, fresh or frozen" over canned, processed or packaged foods which are more sugary, salty and fatty. 70 to 75% of food eaten should be vegetables and fruit. Three meals at set times with snacks allowed between meals, but they must be fruit or vegetables. Aim to eat over a 12 hour period , example 7 am to 7 pm, and STOP after  your last meal of the day. Drink water,generally about 64 ounces per day, no other drink is as healthy. Fruit juice is best enjoyed in a healthy way, by EATING the fruit. Thanks for choosing [facility], we consider it a privelige to serve you.  Best for 2022!

## 2020-07-01 ENCOUNTER — Encounter: Payer: Self-pay | Admitting: Family Medicine

## 2020-07-01 DIAGNOSIS — B369 Superficial mycosis, unspecified: Secondary | ICD-10-CM | POA: Insufficient documentation

## 2020-07-01 DIAGNOSIS — R768 Other specified abnormal immunological findings in serum: Secondary | ICD-10-CM | POA: Insufficient documentation

## 2020-07-01 NOTE — Assessment & Plan Note (Signed)
Uncontrolled, start amlodipine 5 mg DASH diet and commitment to daily physical activity for a minimum of 30 minutes discussed and encouraged, as a part of hypertension management. The importance of attaining a healthy weight is also discussed.  BP/Weight 06/26/2020 05/13/2020 04/17/2020 04/16/2020 03/31/2020 02/28/2020 02/26/2020  Systolic BP 143 152 130 151 - 121 142  Diastolic BP 86 79 89 91 - 81 69  Wt. (Lbs) 218.08 218 218 218 220 220 221.6  BMI 35.2 35.19 35.19 35.19 35.51 35.51 35.77  Some encounter information is confidential and restricted. Go to Review Flowsheets activity to see all data.

## 2020-07-01 NOTE — Assessment & Plan Note (Signed)
  Patient re-educated about  the importance of commitment to a  minimum of 150 minutes of exercise per week as able.  The importance of healthy food choices with portion control discussed, as well as eating regularly and within a 12 hour window most days. The need to choose "clean , green" food 50 to 75% of the time is discussed, as well as to make water the primary drink and set a goal of 64 ounces water daily.    Weight /BMI 06/26/2020 05/13/2020 04/17/2020  WEIGHT 218 lb 1.3 oz 218 lb 218 lb  HEIGHT 5\' 6"  5\' 6"  5\' 6"   BMI 35.2 kg/m2 35.19 kg/m2 35.19 kg/m2  Some encounter information is confidential and restricted. Go to Review Flowsheets activity to see all data.

## 2020-07-01 NOTE — Assessment & Plan Note (Signed)
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

## 2020-07-01 NOTE — Progress Notes (Signed)
   Julie Ryan     MRN: [id number]      DOB: [birth year]   HPI Julie Ryan is here for follow up and re-evaluation of chronic medical conditions, medication management and review of any available recent lab and radiology data.  Preventive health is updated, specifically  Cancer screening and Immunization.   Recent labs show markedly elevated rheumatoid  C/o itchy rash in elbow creases , recurrent and worse in the Summer factor The PT denies any adverse reactions to current medications since the last visit.     ROS Denies recent fever or chills. Denies sinus pressure, nasal congestion, ear pain or sore throat. Denies chest congestion, productive cough or wheezing. Denies chest pains, palpitations and leg swelling Denies abdominal pain, nausea, vomiting,diarrhea or constipation.   Denies dysuria, frequency, hesitancy or incontinence. C/o small joint pain  And deformity Denies headaches, seizures, numbness, or tingling. Denies depression, anxiety or insomnia.  PE  BP (!) 143/86   Pulse 88   Resp 16   Ht 5\' 6"  (1.676 m)   Wt 218 lb 1.3 oz (98.9 kg)   SpO2 97%   BMI 35.20 kg/m   Patient alert and oriented and in no cardiopulmonary distress.  HEENT: No facial asymmetry, EOMI,     Neck supple .  Chest: Clear to auscultation bilaterally.  CVS: S1, S2 no murmurs, no S3.Regular rate.  ABD: Soft non tender.   Ext: No edema  MS: Adequate ROM spine, shoulders, hips and knees. Deformity noted in joints of digits Skin: Intact, fungal and eczematous  rash noted.in elbow creases anterior aspect  Psych: Good eye contact, normal affect. Memory intact not anxious or depressed appearing.  CNS: CN 2-12 intact, power,  normal throughout.no focal deficits noted.   Assessment & Plan Essential hypertension Uncontrolled, start amlodipine 5 mg DASH diet and commitment to daily physical activity for a minimum of 30 minutes discussed and encouraged, as a part of hypertension  management. The importance of attaining a healthy weight is also discussed.  BP/Weight 06/26/2020 05/13/2020 04/17/2020 04/16/2020 03/31/2020 02/28/2020 02/26/2020  Systolic BP 143 152 130 151 - 02/28/2020 142  Diastolic BP 86 79 89 91 - 81 69  Wt. (Lbs) 218.08 218 218 218 220 220 221.6  BMI 35.2 35.19 35.19 35.19 35.51 35.51 35.77  Some encounter information is confidential and restricted. Go to Review Flowsheets activity to see all data.       Elevated rheumatoid factor Markedly elevated rheumatoid factor with deformity of finger joints, refer to Rheumatology  Dermatomycosis topicsl clotrimazole betameth  MORBID OBESITY  Patient re-educated about  the importance of commitment to a  minimum of 150 minutes of exercise per week as able.  The importance of healthy food choices with portion control discussed, as well as eating regularly and within a 12 hour window most days. The need to choose "clean , green" food 50 to 75% of the time is discussed, as well as to make water the primary drink and set a goal of 64 ounces water daily.    Weight /BMI 06/26/2020 05/13/2020 04/17/2020  WEIGHT 218 lb 1.3 oz 218 lb 218 lb  HEIGHT 5\' 6"  5\' 6"  5\' 6"   BMI 35.2 kg/m2 35.19 kg/m2 35.19 kg/m2  Some encounter information is confidential and restricted. Go to Review Flowsheets activity to see all data.

## 2020-07-01 NOTE — Assessment & Plan Note (Signed)
topicsl clotrimazole betameth

## 2020-07-09 ENCOUNTER — Other Ambulatory Visit: Payer: Self-pay | Admitting: Orthopedic Surgery

## 2020-07-17 ENCOUNTER — Ambulatory Visit: Payer: BC Managed Care – PPO | Admitting: Orthopedic Surgery

## 2020-07-23 ENCOUNTER — Other Ambulatory Visit: Payer: Self-pay

## 2020-07-23 ENCOUNTER — Encounter: Payer: Self-pay | Admitting: Orthopedic Surgery

## 2020-07-23 ENCOUNTER — Ambulatory Visit: Payer: Self-pay | Admitting: Orthopedic Surgery

## 2020-07-23 ENCOUNTER — Ambulatory Visit (INDEPENDENT_AMBULATORY_CARE_PROVIDER_SITE_OTHER): Payer: Self-pay | Admitting: Orthopedic Surgery

## 2020-07-23 VITALS — BP 143/91 | HR 79 | Ht 66.0 in | Wt 218.0 lb

## 2020-07-23 DIAGNOSIS — M25561 Pain in right knee: Secondary | ICD-10-CM

## 2020-07-23 DIAGNOSIS — G8929 Other chronic pain: Secondary | ICD-10-CM

## 2020-07-23 DIAGNOSIS — M1710 Unilateral primary osteoarthritis, unspecified knee: Secondary | ICD-10-CM

## 2020-07-23 DIAGNOSIS — M23321 Other meniscus derangements, posterior horn of medial meniscus, right knee: Secondary | ICD-10-CM

## 2020-07-23 NOTE — Progress Notes (Signed)
Schedule follow-up visit for Julie Ryan  Encounter Diagnoses  Name Primary?  . Chronic pain of right knee Yes  . Primary localized osteoarthritis of knee-right   . Derangement of posterior horn of medial meniscus of right knee     She says her knee feels good on the medication which includes Voltaren and Tylenol 3 she is not having any symptoms right now  She has excellent range of motion no crepitance in both knees and both knees are stable she is ambulatory without assistive devices  She will continue the medicine and we released her for now  (2 chronic illnesses both stable low risk of morbidity from the medication)

## 2020-08-05 ENCOUNTER — Encounter: Payer: Self-pay | Admitting: Urology

## 2020-08-05 ENCOUNTER — Other Ambulatory Visit: Payer: Self-pay

## 2020-08-05 ENCOUNTER — Ambulatory Visit (INDEPENDENT_AMBULATORY_CARE_PROVIDER_SITE_OTHER): Payer: BC Managed Care – PPO | Admitting: Urology

## 2020-08-05 VITALS — BP 121/81 | HR 89 | Temp 98.3°F

## 2020-08-05 DIAGNOSIS — N3281 Overactive bladder: Secondary | ICD-10-CM | POA: Diagnosis not present

## 2020-08-05 LAB — URINALYSIS, ROUTINE W REFLEX MICROSCOPIC
Bilirubin, UA: NEGATIVE
Glucose, UA: NEGATIVE
Ketones, UA: NEGATIVE
Leukocytes,UA: NEGATIVE
Nitrite, UA: POSITIVE — AB
Protein,UA: NEGATIVE
RBC, UA: NEGATIVE
Specific Gravity, UA: 1.025 (ref 1.005–1.030)
Urobilinogen, Ur: 1 mg/dL (ref 0.2–1.0)
pH, UA: 5.5 (ref 5.0–7.5)

## 2020-08-05 LAB — MICROSCOPIC EXAMINATION
Epithelial Cells (non renal): >10 /hpf — AB (ref 0–10)
RBC, Urine: NONE SEEN /hpf (ref 0–2)
Renal Epithel, UA: NONE SEEN /hpf

## 2020-08-05 NOTE — Progress Notes (Signed)
H&P  Chief Complaint: F/U of OAB  History of Present Illness: Julie Ryan is a 55 y.o. year old female here for f/u of her urinary incontinence.   2.1.2022: She reports that she dc'd Myrbetriq and started solifenacin. She is pleased w/ her response to this new medication and reports that she empties well, has a good FOS, has had no interim UTIs, and that her incontinence has resolved. She experiences some dry-mouth but no constipation.  (below copied from [facility] records):  11.09.2021: Her symptoms presented approximately 3 years ago and she was placed on myrbetriq at that time. She reports a mix of SUI and UUI (leaning towards UUI) but does not wear incontinence pads at this time. She denies increased urinary frequency, recent UTIs, or constipation. She feels that she is able to empty her bladder completely and has a good FOS. She denies any history of kidney stones.  Past Medical History:  Diagnosis Date  . Anxiety   . Carpal tunnel syndrome of right wrist   . Chronic knee pain   . Depression   . Diabetes mellitus without complication ([facility])   . Endometrial polyp 08/09/2017   will get appt with Dr Elonda Husky  . Fibroids 08/09/2017   Has multiple small fibroids   . GERD (gastroesophageal reflux disease)   . Hypertension   . Neuropathy   . Obesity   . Prediabetes   . Seizures ([place])    1 seizure as a chold; unknown etiology and has never been on meds  . Sleep apnea    did not go back to get results  . Snoring    normal/ unremakable sleep study per neurology in 12/2016    Past Surgical History:  Procedure Laterality Date  . APPENDECTOMY    . CARPAL TUNNEL RELEASE Right 08/01/2015   Procedure: RIGHT CARPAL TUNNEL RELEASE;  Surgeon: Carole Civil, MD;  Location: [facility];  Service: Orthopedics;  Laterality: Right;  . CERVICAL POLYPECTOMY  09/14/2017   Procedure: ENDOMETRIAL POLYPECTOMY;  Surgeon: Florian Buff, MD;  Location: [facility];  Service: Gynecology;;  . COLONOSCOPY N/A 06/15/2017    Procedure: COLONOSCOPY;  Surgeon: Rogene Houston, MD;  Location: [facility];  Service: Endoscopy;  Laterality: N/A;  830  . ENDOMETRIAL ABLATION N/A 09/14/2017   Procedure: ENDOMETRIAL ABLATION( [place]);  Surgeon: Florian Buff, MD;  Location: [facility];  Service: Gynecology;  Laterality: N/A;  . ESOPHAGOGASTRODUODENOSCOPY N/A 05/25/2013   Procedure: ESOPHAGOGASTRODUODENOSCOPY (EGD);  Surgeon: Rogene Houston, MD;  Location: [facility];  Service: Endoscopy;  Laterality: N/A;  220  . GASTRIC BYPASS    . HYSTEROSCOPY WITH D & C N/A 09/14/2017   Procedure: DILATATION AND CURETTAGE /HYSTEROSCOPY;  Surgeon: Florian Buff, MD;  Location: [facility];  Service: Gynecology;  Laterality: N/A;  . TUBAL LIGATION      Home Medications:  (Not in a hospital admission)   Allergies:  Allergies  Allergen Reactions  . Codeine Nausea Only and Other (See Comments)    Dizziness   . Effexor Xr [Venlafaxine Hcl Er] Other (See Comments)    Makes patient feel on edge   . Fluoxetine Other (See Comments)    States that med caused memory problems and crying spells   . Hydrocodone Nausea Only and Other (See Comments)    Dizziness   . Latex Itching and Rash    Family History  Problem Relation Age of Onset  . Hypertension Mother   . Heart disease Mother   .  Diabetes Mother   . Diabetes Sister   . Multiple sclerosis Sister   . Lupus Sister   . Heart disease Sister   . CVA Sister   . Depression Sister   . Diabetes Brother   . Hypertension Brother   . Other Son        brain tumor  . Hypertension Daughter   . Diabetes Sister     Social History:  reports that she quit smoking about 14 months ago. Her smoking use included cigarettes. She has a 5.00 pack-year smoking history. She has never used smokeless tobacco. She reports that she does not drink alcohol and does not use drugs.  ROS: A complete review of systems was performed.  All systems are negative except for pertinent findings as  noted.  Physical Exam:  Vital signs in last 24 hours:   General:  Alert and oriented, No acute distress HEENT: Normocephalic, atraumatic Neck: No JVD or lymphadenopathy Cardiovascular: Regular rate  Extremities: No edema Neurologic: Grossly intact  I have reviewed prior pt notes  I have reviewed notes from referring/previous physicians  I have reviewed urinalysis results   Impression/Assessment:  OAB - Pt has responded very well to solifenacin. She will be continued accordingly and requires no further evaluation at this time.  Plan:  1. Pt provided w/ OAB sheet to aid in addressing her urinary symptoms.  2. Her urologic medication will be managed by her PCP  3. F/U PRN.  CC: Dr. Tula Nakayama  [name] 08/05/2020, 11:59 AM  Lillette Boxer. Tawn Fitzner MD

## 2020-08-05 NOTE — Progress Notes (Signed)
Urological Symptom Review ° °Patient is experiencing the following symptoms: °Get up at night to urinate ° ° °Review of Systems ° °Gastrointestinal (upper)  : °Negative for upper GI symptoms ° °Gastrointestinal (lower) : °Negative for lower GI symptoms ° °Constitutional : °Negative for symptoms ° °Skin: °Negative for skin symptoms ° °Eyes: °Blurred vision ° °Ear/Nose/Throat : °Negative for Ear/Nose/Throat symptoms ° °Hematologic/Lymphatic: °Negative for Hematologic/Lymphatic symptoms ° °Cardiovascular : °Negative for cardiovascular symptoms ° °Respiratory : °Negative for respiratory symptoms ° °Endocrine: °Negative for endocrine symptoms ° °Musculoskeletal: °Negative for musculoskeletal symptoms ° °Neurological: °Negative for neurological symptoms ° °Psychologic: °Negative for psychiatric symptoms °

## 2020-08-08 ENCOUNTER — Other Ambulatory Visit: Payer: Self-pay | Admitting: Orthopedic Surgery

## 2020-08-18 ENCOUNTER — Other Ambulatory Visit: Payer: Self-pay

## 2020-08-18 ENCOUNTER — Encounter: Payer: Self-pay | Admitting: Family Medicine

## 2020-08-18 ENCOUNTER — Telehealth (INDEPENDENT_AMBULATORY_CARE_PROVIDER_SITE_OTHER): Payer: BC Managed Care – PPO | Admitting: Family Medicine

## 2020-08-18 VITALS — BP 120/30 | Ht 66.0 in | Wt 214.0 lb

## 2020-08-18 DIAGNOSIS — M542 Cervicalgia: Secondary | ICD-10-CM

## 2020-08-19 ENCOUNTER — Other Ambulatory Visit: Payer: Self-pay

## 2020-08-19 ENCOUNTER — Telehealth: Payer: BC Managed Care – PPO | Admitting: Family Medicine

## 2020-08-19 VITALS — BP 120/82 | Ht 66.0 in | Wt 214.0 lb

## 2020-08-19 DIAGNOSIS — R768 Other specified abnormal immunological findings in serum: Secondary | ICD-10-CM | POA: Diagnosis not present

## 2020-08-19 DIAGNOSIS — M542 Cervicalgia: Secondary | ICD-10-CM | POA: Diagnosis not present

## 2020-08-19 DIAGNOSIS — R32 Unspecified urinary incontinence: Secondary | ICD-10-CM

## 2020-08-19 DIAGNOSIS — E559 Vitamin D deficiency, unspecified: Secondary | ICD-10-CM

## 2020-08-19 DIAGNOSIS — I10 Essential (primary) hypertension: Secondary | ICD-10-CM | POA: Diagnosis not present

## 2020-08-19 MED ORDER — LORAZEPAM 1 MG PO TABS: ORAL_TABLET | ORAL | 0 refills | Status: DC

## 2020-08-19 NOTE — Patient Instructions (Addendum)
F/U in 5 months, call if you need me before  Please come tomorrow around 1:15 pm for TdAP ,  please verify with patient   Please call and schedule your Rheumatology appointment  Aim for 2 to 3 pound weight loss each month  You are referred for MRI of your neck, 2 tablets are prescribed for anxiety to take before the test, you need someone to drive you to the test  Think about what you will eat, plan ahead. Choose " clean, green, fresh or frozen" over canned, processed or packaged foods which are more sugary, salty and fatty. 70 to 75% of food eaten should be vegetables and fruit. Three meals at set times with snacks allowed between meals, but they must be fruit or vegetables. Aim to eat over a 12 hour period , example 7 am to 7 pm, and STOP after  your last meal of the day. Drink water,generally about 64 ounces per day, no other drink is as healthy. Fruit juice is best enjoyed in a healthy way, by EATING the fruit. Thanks for choosing [facility], we consider it a privelige to serve you.

## 2020-08-19 NOTE — Assessment & Plan Note (Signed)
DASH diet and commitment to daily physical activity for a minimum of 30 minutes discussed and encouraged, as a part of hypertension management. The importance of attaining a healthy weight is also discussed.  BP/Weight 08/19/2020 08/18/2020 08/05/2020 07/23/2020 06/26/2020 05/13/2020 75/30/1040  Systolic BP 459 136 859 923 414 436 016  Diastolic BP 82 30 81 91 86 79 89  Wt. (Lbs) 214 214 - 218 218.08 218 218  BMI 34.54 34.54 - 35.19 35.2 35.19 35.19  Some encounter information is confidential and restricted. Go to Review Flowsheets activity to see all data.

## 2020-08-19 NOTE — Assessment & Plan Note (Signed)
Pt to call and schedule missed their call and she understands tat she needs to call in

## 2020-08-19 NOTE — Progress Notes (Signed)
Virtual Visit via Telephone Note  I connected with [name] on 08/19/20 at  8:20 AM EST by telephone and verified that I am speaking with the correct person using two identifiers.  Location: Patient:work  Provider:office   I discussed the limitations, risks, security and privacy concerns of performing an evaluation and management service by telephone and the availability of in person appointments. I also discussed with the patient that there may be a patient responsible charge related to this service. The patient expressed understanding and agreed to proceed.   History of Present Illness: F/U chronic problems and address any new or current concerns. Review and update medications and allergies. Review recent lab and radiologic data . Update routine health maintainace. Review an encourage improved health habits to include nutrition, exercise and  sleep .  Denies recent fever or chills. Denies sinus pressure, nasal congestion, ear pain or sore throat. Denies chest congestion, productive cough or wheezing. Denies chest pains, palpitations and leg swelling Denies abdominal pain, nausea, vomiting,diarrhea or constipation.   Denies dysuria, frequency, hesitancy or incontinence. Increased knee and neck pain followed by pain clinic, recently saw Dr Aline Brochure who prescribed T3 for knees and did X ray of the neck which suggests both arthritis an disc disease Duration is 2 months, ranges from a 7 to 10 and involves hands which are weak Denies headaches, seizures, numbness, or tingling. Denies depression, anxiety or insomnia. Denies skin break down or rash.       Observations/Objective: Ht 5\' 6"  (1.676 m)   Wt 214 lb (97.1 kg)   BMI 34.54 kg/m  Good communication with no confusion and intact memory. Alert and oriented x 3 No signs of respiratory distress during speech    Assessment and Plan:  Neck pain 2 month h/o neck pain radiaiting to hands from a 7 to a 10, both hands are  weak, has had right  CTS, needs MRI of neck  Essential hypertension DASH diet and commitment to daily physical activity for a minimum of 30 minutes discussed and encouraged, as a part of hypertension management. The importance of attaining a healthy weight is also discussed.  BP/Weight 08/19/2020 08/18/2020 08/05/2020 07/23/2020 06/26/2020 05/13/2020 35/57/3220  Systolic BP 254 270 623 762 831 517 616  Diastolic BP 82 30 81 91 86 79 89  Wt. (Lbs) 214 214 - 218 218.08 218 218  BMI 34.54 34.54 - 35.19 35.2 35.19 35.19  Some encounter information is confidential and restricted. Go to Review Flowsheets activity to see all data.       MORBID OBESITY  Patient re-educated about  the importance of commitment to a  minimum of 150 minutes of exercise per week as able.  The importance of healthy food choices with portion control discussed, as well as eating regularly and within a 12 hour window most days. The need to choose "clean , green" food 50 to 75% of the time is discussed, as well as to make water the primary drink and set a goal of 64 ounces water daily.    Weight /BMI 08/19/2020 08/18/2020 07/23/2020  WEIGHT 214 lb 214 lb 218 lb  HEIGHT 5\' 6"  5\' 6"  5\' 6"   BMI 34.54 kg/m2 34.54 kg/m2 35.19 kg/m2  Some encounter information is confidential and restricted. Go to Review Flowsheets activity to see all data.      Elevated rheumatoid factor Pt to call and schedule missed their call and she understands tat she needs to call in  Urinary incontinence Controlled, no change  in medication Has had urology eval and been released  Vitamin D deficiency Updated lab needed at/ before next visit.    Follow Up Instructions:    I discussed the assessment and treatment plan with the patient. The patient was provided an opportunity to ask questions and all were answered. The patient agreed with the plan and demonstrated an understanding of the instructions.   The patient was advised to call back or  seek an in-person evaluation if the symptoms worsen or if the condition fails to improve as anticipated.  I provided 24 minutes of non-face-to-face time during this encounter.   Tula Nakayama, MD

## 2020-08-19 NOTE — Assessment & Plan Note (Signed)
Controlled, no change in medication Has had urology eval and been released

## 2020-08-19 NOTE — Assessment & Plan Note (Signed)
Updated lab needed at/ before next visit.   

## 2020-08-19 NOTE — Assessment & Plan Note (Signed)
  Patient re-educated about  the importance of commitment to a  minimum of 150 minutes of exercise per week as able.  The importance of healthy food choices with portion control discussed, as well as eating regularly and within a 12 hour window most days. The need to choose "clean , green" food 50 to 75% of the time is discussed, as well as to make water the primary drink and set a goal of 64 ounces water daily.    Weight /BMI 08/19/2020 08/18/2020 07/23/2020  WEIGHT 214 lb 214 lb 218 lb  HEIGHT 5\' 6"  5\' 6"  5\' 6"   BMI 34.54 kg/m2 34.54 kg/m2 35.19 kg/m2  Some encounter information is confidential and restricted. Go to Review Flowsheets activity to see all data.

## 2020-08-19 NOTE — Assessment & Plan Note (Signed)
2 month h/o neck pain radiaiting to hands from a 7 to a 10, both hands are weak, has had right  CTS, needs MRI of neck

## 2020-08-20 ENCOUNTER — Ambulatory Visit (INDEPENDENT_AMBULATORY_CARE_PROVIDER_SITE_OTHER): Payer: BC Managed Care – PPO

## 2020-08-20 ENCOUNTER — Other Ambulatory Visit: Payer: Self-pay

## 2020-08-20 DIAGNOSIS — Z23 Encounter for immunization: Secondary | ICD-10-CM | POA: Diagnosis not present

## 2020-08-21 ENCOUNTER — Ambulatory Visit (HOSPITAL_COMMUNITY)
Admission: RE | Admit: 2020-08-21 | Discharge: 2020-08-21 | Disposition: A | Discharge status: Home / Self Care | Payer: BC Managed Care – PPO | Source: Ambulatory Visit | Attending: Family Medicine | Admitting: Family Medicine

## 2020-08-21 DIAGNOSIS — M542 Cervicalgia: Secondary | ICD-10-CM | POA: Insufficient documentation

## 2020-08-22 ENCOUNTER — Other Ambulatory Visit: Payer: Self-pay | Admitting: Family Medicine

## 2020-08-22 DIAGNOSIS — R937 Abnormal findings on diagnostic imaging of other parts of musculoskeletal system: Secondary | ICD-10-CM

## 2020-08-22 DIAGNOSIS — M542 Cervicalgia: Secondary | ICD-10-CM

## 2020-08-24 NOTE — Progress Notes (Signed)
Unable to connect with pt, cancelled

## 2020-08-26 ENCOUNTER — Telehealth: Payer: Self-pay

## 2020-08-26 NOTE — Telephone Encounter (Signed)
Patient called she got a tetnus shot with [place] last week and she has a knot is this normal ? I can call her back just let me know p# [phone]

## 2020-08-26 NOTE — Telephone Encounter (Signed)
This can be normal at the injection site. Watch for redness that is spreading and warm to touch. She can use hot/cold compresses and Tylenol/Motrin. It should resolve within a few days. If not, she may need an appt, but it can be normal.

## 2020-08-27 NOTE — Progress Notes (Signed)
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 )   History of Present Illness: Julie Ryan is a 55 y.o. female with history of T2DM, R-n-Y bypass surgery, GERD and erosive esophagitis here for evaluation of positive rheumatoid factor and deformity of fingers.  She has longstanding history of bilateral knee pain with multiple arthroscopic procedures this is partially improved after bariatric surgery with some weight loss but remains limiting for her activity level.  More recently she is having more trouble with neck pain and stiffness as well as bilateral hand pain stiffness and swelling.  This is ongoing since at least a few months ago.  Her symptoms are worst in the morning with stiffness lasting at least several hours and improves with activity during the day. Laboratory evaluation in November showed highly positive rheumatoid factor. She has had partial benefit with analgesics but not on any specific treatment for this currently.  She had MRI of the cervical spine earlier this month that showed multilevel disc degeneration.  She has had an injection for right hand fourth digit trigger finger but no other joint surgery or procedures outside of her knees.  She has a family history of lupus but no rheumatoid arthritis.    Labs reviewed 05/2020 RF 153 ANA neg Vit D 36 Uric acid 5.3 ESR 23   Imaging reviewed 08/2020 MR Cervical spine 1. Multilevel cervical disc degeneration, greatest at C6-7 where there is mild-to-moderate right neural foraminal stenosis. 2. No spinal stenosis.  04/2020 MRI R  knee 1. Degenerative free edge tearing of the posterior horn and body of the medial meniscus with associated partial extrusion of the meniscus from the joint. The meniscal root appears intact. 2. Underlying moderate medial compartment osteoarthritis. No acute osseous findings. 3. Small mildly complex joint effusion and small Baker's cyst. 4. Patella alta.  12/2019 Xray left hand Findings: The alignment of the bones in the hand show normal findings and angles. The joint spaces are preserved. There is no evidence of arthritis. End of bone tumors or soft tissue swelling.   Activities of Daily Living:  Patient reports morning stiffness for several hours to all day.    Patient Reports nocturnal pain.  Difficulty dressing/grooming: Denies Difficulty climbing stairs: Reports Difficulty getting out of chair: Reports Difficulty using hands for taps, buttons, cutlery, and/or writing: Reports  Review of Systems  Constitutional: Positive for fatigue.  HENT: Positive for mouth dryness. Negative for mouth sores and nose dryness.   Eyes: Positive for itching, visual disturbance and dryness. Negative for pain.  Respiratory: Negative for cough, hemoptysis, shortness of breath and difficulty breathing.   Cardiovascular: Positive for swelling in legs/feet. Negative for chest pain and palpitations.  Gastrointestinal: Negative for abdominal pain, blood in stool, constipation and diarrhea.  Endocrine: Negative for increased urination.  Genitourinary: Negative for painful urination.  Musculoskeletal: Positive for arthralgias, joint pain, joint swelling and morning stiffness. Negative for myalgias, muscle weakness, muscle tenderness and myalgias.  Skin: Positive  for color change and rash. Negative for redness.  Allergic/Immunologic: Negative for susceptible to infections.  Neurological: Positive for headaches, memory loss and weakness. Negative for dizziness and numbness.  Hematological: Negative for swollen  glands.  Psychiatric/Behavioral: Positive for confusion and sleep disturbance.    PMFS History:  Patient Active Problem List   Diagnosis Date Noted  . 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 factor ([place]) 08/28/2020  . High risk medication use 08/28/2020  . Neck pain 08/19/2020  . Elevated rheumatoid factor 07/01/2020  . Dermatomycosis 07/01/2020  . Knee instability, left 09/06/2019  . Hiatal hernia 07/17/2019  . 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 Back spasm 04/12/2018  . Endometrial polyp 08/09/2017  . Fibroids 08/09/2017  . Vaginal dryness 07/18/2017  . Leg edema 03/08/2017  . Metabolic syndrome X 43/15/4008  . Erosive esophagitis 06/25/2013  . GERD (gastroesophageal reflux disease) 03/22/2013  . Left knee pain 03/22/2013  . Snoring 08/05/2011  . Urinary incontinence 08/05/2011  . Vitamin D deficiency 03/21/2011  . Allergic rhinitis 09/20/2010  . Type 2 diabetes mellitus with vascular disease ([place]) 04/16/2010  . Back pain 03/17/2010  . MORBID OBESITY 02/11/2010  . Essential hypertension 02/11/2010    Past Medical History:  Diagnosis Date  . Anxiety   . Arthritis    Phreesia 08/19/2020  . Carpal tunnel syndrome of right wrist   . Chronic knee pain   . Depression   . Diabetes mellitus without complication ([place])   . Endometrial polyp 08/09/2017   will get appt with Dr Elonda Husky  . Fibroids 08/09/2017   Has multiple small fibroids   . GERD (gastroesophageal reflux disease)   . Hypertension   . Neuropathy   . Obesity   . Prediabetes   . Seizures ([place])    1 seizure as a chold; unknown etiology and has never been on meds  . Sleep apnea    did not go back to get results  . Snoring    normal/ unremakable sleep study per neurology in 12/2016    Family History  Problem Relation Age of Onset  . Hypertension Mother   . Heart disease Mother   . Diabetes Mother   . Diabetes Sister   . Multiple sclerosis Sister   . Lupus Sister   .  Heart disease Sister   . CVA Sister   . Depression Sister   . Diabetes Brother   . Hypertension Brother   . Other Son        brain tumor  . Hypertension Daughter   . Diabetes Sister    Past Surgical History:  Procedure Laterality Date  . APPENDECTOMY    . CARPAL TUNNEL RELEASE Right 08/01/2015   Procedure: RIGHT CARPAL TUNNEL RELEASE;  Surgeon: Carole Civil, MD;  Location: [facility];  Service: Orthopedics;  Laterality: Right;  . CERVICAL POLYPECTOMY  09/14/2017   Procedure: ENDOMETRIAL POLYPECTOMY;  Surgeon: Florian Buff, MD;  Location: [facility];  Service: Gynecology;;  . COLONOSCOPY N/A 06/15/2017   Procedure: COLONOSCOPY;  Surgeon: Rogene Houston, MD;  Location: [facility];  Service: Endoscopy;  Laterality: N/A;  830  . ENDOMETRIAL ABLATION N/A 09/14/2017   Procedure: ENDOMETRIAL ABLATION( [place]);  Surgeon: Florian Buff, MD;  Location: [facility];  Service: Gynecology;  Laterality: N/A;  . ESOPHAGOGASTRODUODENOSCOPY N/A 05/25/2013   Procedure: ESOPHAGOGASTRODUODENOSCOPY (EGD);  Surgeon: Rogene Houston, MD;  Location: [facility];  Service: Endoscopy;  Laterality: N/A;  220  . GASTRIC BYPASS    .  HYSTEROSCOPY WITH D & C N/A 09/14/2017   Procedure: DILATATION AND CURETTAGE /HYSTEROSCOPY;  Surgeon: Florian Buff, MD;  Location: [facility];  Service: Gynecology;  Laterality: N/A;  . TUBAL LIGATION     Social History   Social History Narrative  . Not on file   Immunization History  Administered Date(s) Administered  . Influenza Whole 03/18/2011  . Influenza,inj,Quad PF,6+ Mos 03/22/2013, 07/31/2015, 06/01/2016, 04/12/2018, 05/07/2019, 06/26/2020  . Influenza-Unspecified 02/26/2017  . Moderna Sars-Covid-2 Vaccination 12/05/2019, 01/02/2020, 08/01/2020  . PPD Test 07/04/2013, 07/26/2013, 03/15/2016, 03/02/2017, 03/21/2018, 12/17/2019  . Td 06/24/2010  . Tdap 08/20/2020  . Zoster Recombinat (Shingrix) 02/07/2020, 05/01/2020     Objective: Vital Signs: BP 120/69 (BP  Location: Left Arm, Patient Position: Sitting, Cuff Size: Normal)   Pulse 64   Ht 5' 6.25" (1.683 m)   Wt 217 lb 3.2 oz (98.5 kg)   BMI 34.79 kg/m    Physical Exam Constitutional:      Appearance: She is obese.  HENT:     Right Ear: External ear normal.     Left Ear: External ear normal.     Mouth/Throat:     Mouth: Mucous membranes are moist.     Pharynx: Oropharynx is clear.  Eyes:     Conjunctiva/sclera: Conjunctivae normal.  Cardiovascular:     Rate and Rhythm: Normal rate and regular rhythm.  Pulmonary:     Effort: Pulmonary effort is normal.     Breath sounds: Normal breath sounds.  Skin:    General: Skin is warm and dry.     Findings: No rash.  Neurological:     General: No focal deficit present.     Mental Status: She is alert.  Psychiatric:        Mood and Affect: Mood normal.     Musculoskeletal Exam:  Neck full ROM paraspinal muscle pain with lateral rotation left greater than Shoulders full ROM no tenderness or swelling, restricted range of motion with internal rotation while abducted with muscle tightness Elbows full ROM no tenderness or swelling Wrists full ROM no tenderness or swelling Fingers full ROM, swelling of the first CMP third MCP and third PIP of the right hand and third PIP of the left hand, bilateral third digit swan-neck deformity and tenderness to palpation Knees bilateral bony enlargement in mild swelling present range of motion seems normal with patellofemoral crepitus and mild tenderness and range of motion in flexion and to pressure Ankles full ROM no tenderness or swelling MTPs full ROM no tenderness or swelling, cocked up toe deformity of MTPs left more than right   CDAI Exam: CDAI Score: 11.9  Patient Global: 4 mm; Provider Global: 5 mm Swollen: 6 ; Tender: 5  Joint Exam 08/28/2020      Right  Left  MCP 1  Swollen Tender     MCP 3  Swollen      PIP 3  Swollen Tender  Swollen Tender  Knee  Swollen Tender  Swollen Tender      Investigation: No additional findings.  Imaging: MR Cervical Spine Wo Contrast  Result Date: 08/22/2020 CLINICAL DATA:  Neck pain radiating to the upper extremities, worse on the left. EXAM: MRI CERVICAL SPINE WITHOUT CONTRAST TECHNIQUE: Multiplanar, multisequence MR imaging of the cervical spine was performed. No intravenous contrast was administered. COMPARISON:  Cervical spine radiographs 01/01/2020 FINDINGS: Alignment: Cervical spine straightening.  No listhesis. Vertebrae: No fracture, suspicious osseous lesion, or significant marrow edema. Mild disc space narrowing at C4-5 and  moderate narrowing at C5-6 and C6-7 with associated chronic degenerative endplate changes. Cord: Normal signal. Posterior Fossa, vertebral arteries, paraspinal tissues: Unremarkable. Disc levels: C2-3: Negative. C3-4: Small central disc protrusion without stenosis. C4-5: A small central disc protrusion slightly indents the ventral spinal cord. No significant stenosis. C5-6: Disc bulging slightly flattens the ventral spinal cord. No significant stenosis. C6-7: Disc bulging and asymmetric right uncovertebral spurring result in mild-to-moderate right neural foraminal stenosis without significant spinal stenosis. C7-T1: Mild right facet arthrosis without disc herniation or stenosis. IMPRESSION: 1. Multilevel cervical disc degeneration, greatest at C6-7 where there is mild-to-moderate right neural foraminal stenosis. 2. No spinal stenosis. Electronically Signed   By: Logan Bores M.D.   On: 08/22/2020 08:25   XR Hand 2 View Left  Result Date: 08/28/2020 X-ray left hand 2 views Radiocarpal joint space appears normal. Multiple cystic appearing changes present at the carpal bones and base of first metacarpal on radial aspect. MCP PIP and DIP joint spaces all appear normal no osteophyte or erosion seen. Bone mineralization appears normal. No soft tissue swelling seen. Impression Mild first CMC joint osteoarthritis no erosive  changes seen  XR Hand 2 View Right  Result Date: 08/28/2020 X-ray right hand 2 views Radiocarpal joint space appears normal. Carpal bones are normal mild first CMC joint degenerative changes with mild subluxation. Probable partial subluxation at first MCP joint without sclerosis or osteophytes. MCP PIP and DIP joint spaces appear normal. Bone mineralization appears normal. No soft tissue swelling seen. Impression Mild first CMC joint osteoarthritis some increased subluxation at first CMP no erosive changes   Recent Labs: Lab Results  Component Value Date   WBC 9.4 07/27/2019   HGB 14.1 07/27/2019   PLT 300 07/27/2019   NA 139 02/05/2020   K 4.1 02/05/2020   CL 103 02/05/2020   CO2 27 02/05/2020   GLUCOSE 90 02/05/2020   BUN 17 02/05/2020   CREATININE 0.71 02/05/2020   BILITOT 0.7 02/05/2020   ALKPHOS 79 07/27/2019   AST 22 02/05/2020   ALT 22 02/05/2020   PROT 7.2 02/05/2020   ALBUMIN 4.1 07/27/2019   CALCIUM 9.3 02/05/2020   GFRAA 110 02/05/2020    Speciality Comments: No specialty comments available.  Procedures:  No procedures performed Allergies: Codeine, Effexor xr [venlafaxine hcl er], Fluoxetine, Hydrocodone, and Latex   Assessment / Plan:     Visit Diagnoses: 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 factor ([place]) - Plan: XR Hand 2 View Left, XR Hand 2 View Right, CBC with Differential/Platelet, Cyclic citrul peptide antibody, IgG, Sedimentation rate  Joint pain and swelling present and bilateral hands consistent with RA.  Her neck pain and knee pain may be entirely degenerative but can also be involved with rheumatoid arthritis.  We will check CCP and sedimentation rate again today for disease activity and characterization.  Checking bilateral hand x-rays for any erosive disease progress. Likely methotrexate first-line if not contraindicated, high disease activity by CDAI so significant risk to need additional treatments.  Elevated rheumatoid  factor  Rheumatoid factor significantly elevated at 153.  We will also check CCP antibody titer and repeat sedimentation rate.  She has no history of other chronic conditions associated with this antibody.  Type 2 diabetes mellitus with vascular disease ([place])  Type 2 diabetes with vascular complications will aim to minimize exposure to steroid use for this also with a history of erosive esophagitis.  Neck pain  Cervical degenerative disc disease can be increased related to root arthritis  or could be independent problem.  She also has paraspinal muscle pain with occipital type headaches would probably benefit from starting or increasing stretching exercises.  High risk medication use - Plan: CBC with Differential/Platelet, COMPLETE METABOLIC PANEL WITH GFR, Hepatitis panel, acute, HIV Antibody (routine testing w rflx), QuantiFERON-TB Gold Plus  Checking CBC, CMP, hepatitis panel, TB as baseline screening and monitoring for high risk medication use  Orders: Orders Placed This Encounter  Procedures  . XR Hand 2 View Left  . XR Hand 2 View Right  . CBC with Differential/Platelet  . COMPLETE METABOLIC PANEL WITH GFR  . Cyclic citrul peptide antibody, IgG  . Hepatitis panel, acute  . HIV Antibody (routine testing w rflx)  . QuantiFERON-TB Gold Plus  . Sedimentation rate   No orders of the defined types were placed in this encounter.   Follow-Up Instructions: Return in about 2 weeks (around 09/11/2020).   Collier Salina, MD  Note - This record has been created using [employer].  Chart creation errors have been sought, but may not always  have been located. Such creation errors do not reflect on  the standard of medical care.

## 2020-08-28 ENCOUNTER — Other Ambulatory Visit: Payer: Self-pay

## 2020-08-28 ENCOUNTER — Encounter: Payer: Self-pay | Admitting: Internal Medicine

## 2020-08-28 ENCOUNTER — Ambulatory Visit (INDEPENDENT_AMBULATORY_CARE_PROVIDER_SITE_OTHER): Payer: BC Managed Care – PPO | Admitting: Internal Medicine

## 2020-08-28 ENCOUNTER — Ambulatory Visit: Payer: Self-pay

## 2020-08-28 VITALS — BP 120/69 | HR 64 | Ht 66.25 in | Wt 217.2 lb

## 2020-08-28 DIAGNOSIS — M05741 Rheumatoid arthritis with rheumatoid factor of right hand without organ or systems involvement: Secondary | ICD-10-CM | POA: Diagnosis not present

## 2020-08-28 DIAGNOSIS — E1159 Type 2 diabetes mellitus with other circulatory complications: Secondary | ICD-10-CM

## 2020-08-28 DIAGNOSIS — M542 Cervicalgia: Secondary | ICD-10-CM

## 2020-08-28 DIAGNOSIS — R768 Other specified abnormal immunological findings in serum: Secondary | ICD-10-CM

## 2020-08-28 DIAGNOSIS — M05742 Rheumatoid arthritis with rheumatoid factor of left hand without organ or systems involvement: Secondary | ICD-10-CM

## 2020-08-28 DIAGNOSIS — Z79899 Other long term (current) drug therapy: Secondary | ICD-10-CM

## 2020-08-28 NOTE — Patient Instructions (Signed)
Rheumatoid Arthritis Rheumatoid arthritis (RA) is a long-term (chronic) disease that causes inflammation in your joints. RA may start slowly. It most often affects the small joints of the hands and feet. Usually, the same joints are affected on both sides of your body. Inflammation from RA can also affect other parts of your body, including your heart, eyes, or lungs. There is no cure for RA, but medicines can help your symptoms and halt or slow down the progression of the disease. What are the causes? RA is an autoimmune disease. When you have an autoimmune disease, your body's defense system (immune system) mistakenly attacks healthy body tissues. The exact cause of RA is not known. What increases the risk? You are more likely to develop this condition if you:  Are a woman.  Have a family history of RA or other autoimmune diseases.  Have a history of smoking.  Are obese.  Have been exposed to pollutants or chemicals. What are the signs or symptoms? The first symptom of this condition may be morning stiffness that lasts longer than 30 minutes.  Symptoms usually start gradually. They are often worse in the morning. As RA progresses, symptoms may include:  Pain, stiffness, swelling, warmth, and tenderness in joints on both sides of your body.  Loss of energy.  Loss of appetite.  Weight loss.  Low-grade fever.  Dry eyes and dry mouth.  Firm lumps (rheumatoid nodules) that grow beneath your skin in areas such as your forearm bones near your elbows and on your hands.  Changes in the appearance of joints (deformity) and loss of joint function. Symptoms of this condition vary from person to person.  Symptoms of RA often come and go.  Sometimes, symptoms get worse for a period of time. These are called flares. How is this diagnosed? This condition is diagnosed based on your symptoms, medical history, and physical exam.  You may have X-rays or an MRI to check for the type  of joint changes that are caused by RA. You may also have blood tests to look for:  Proteins (antibodies) that your immune system may make if you have RA. These include rheumatoid factor (RF) and anti-CCP. ? When blood tests show these proteins, you are said to have "seropositive RA." ? When blood tests do not show these proteins, you may have "seronegative RA."  Inflammation in your blood.  A low number of red blood cells (anemia). How is this treated? The goals of treatment are to relieve pain, reduce inflammation, and slow down or stop joint damage and disability. Treatment may include:  Lifestyle changes. It is important to rest as needed, eat a healthy diet, and exercise.  Medicines. Your health care provider may adjust your medicines every 3 months until treatment goals are reached. Common medicines include: ? Pain relievers (analgesics). ? Corticosteroids and NSAIDs to reduce inflammation. ? Disease-modifying antirheumatic drugs (DMARDs) to try to slow the course of the disease. ? Biologic response modifiers to reduce inflammation and damage.  Physical therapy and occupational therapy.  Surgery, if you have severe joint damage. Joint replacement or fusing of joints may be needed. Your health care provider will work with you to identify the best treatment option for you based on assessment of the overall disease activity in your body.   Follow these instructions at home: Activity  Return to your normal activities as told by your health care provider. Ask your health care provider what activities are safe for you.  Rest when  you are having a flare.  Start an exercise program as told by your health care provider. General instructions  Keep all follow-up visits as told by your health care provider. This is important.  Take over-the-counter and prescription medicines only as told by your health care provider. Where to find more information  [employer] of Rheumatology:  www.rheumatology.[place]: www.arthritis.org Contact a health care provider if:  You have a flare-up of RA symptoms.  You have a fever.  You have side effects from your medicines. Get help right away if:  You have chest pain.  You have trouble breathing.  You quickly develop a hot, painful joint that is more severe than your usual joint aches. Summary  Rheumatoid arthritis (RA) is a long-term (chronic) disease that causes inflammation in your joints.  RA is an autoimmune disease.  The goals of treatment are to relieve pain, reduce inflammation, and slow down or stop joint damage and disability. This information is not intended to replace advice given to you by your health care provider. Make sure you discuss any questions you have with your health care provider. Document Revised: 01/02/2019 Document Reviewed: 02/21/2018 Elsevier Patient Education  2021 [employer].

## 2020-09-01 NOTE — Progress Notes (Signed)
Xrays of hands show no erosive damage such as may be seen with inflammatory arthritis. I do not have ordered lab results to review yet.

## 2020-09-03 NOTE — Progress Notes (Signed)
Additional antibody test for rheumatoid arthritis is highly positive. Based on our visit and on this result this is very consistent with rheumatoid arthritis. We should follow up as planned to take another look at her and talk about treatment options.

## 2020-09-04 LAB — CBC WITH DIFFERENTIAL/PLATELET
Absolute Monocytes: 479 cells/uL (ref 200–950)
Basophils Absolute: 42 cells/uL (ref 0–200)
Basophils Relative: 0.5 %
Eosinophils Absolute: 210 cells/uL (ref 15–500)
Eosinophils Relative: 2.5 %
HCT: 41.7 % (ref 35.0–45.0)
Hemoglobin: 14 g/dL (ref 11.7–15.5)
Lymphs Abs: 2957 cells/uL (ref 850–3900)
MCH: 31.3 pg (ref 27.0–33.0)
MCHC: 33.6 g/dL (ref 32.0–36.0)
MCV: 93.3 fL (ref 80.0–100.0)
MPV: 10 fL (ref 7.5–12.5)
Monocytes Relative: 5.7 %
Neutro Abs: 4712 cells/uL (ref 1500–7800)
Neutrophils Relative %: 56.1 %
Platelets: 342 10*3/uL (ref 140–400)
RBC: 4.47 10*6/uL (ref 3.80–5.10)
RDW: 13.1 % (ref 11.0–15.0)
Total Lymphocyte: 35.2 %
WBC: 8.4 10*3/uL (ref 3.8–10.8)

## 2020-09-04 LAB — QUANTIFERON-TB GOLD PLUS
Mitogen-NIL: >10 IU/mL
NIL: 0.04 IU/mL
QuantiFERON-TB Gold Plus: NEGATIVE
TB1-NIL: <0 IU/mL
TB2-NIL: <0 IU/mL

## 2020-09-04 LAB — COMPLETE METABOLIC PANEL WITH GFR
AG Ratio: 1.2 (calc) (ref 1.0–2.5)
ALT: 17 U/L (ref 6–29)
AST: 15 U/L (ref 10–35)
Albumin: 4.1 g/dL (ref 3.6–5.1)
Alkaline phosphatase (APISO): 117 U/L (ref 37–153)
BUN: 14 mg/dL (ref 7–25)
CO2: 29 mmol/L (ref 20–32)
Calcium: 9.5 mg/dL (ref 8.6–10.4)
Chloride: 105 mmol/L (ref 98–110)
Creat: 0.69 mg/dL (ref 0.50–1.05)
GFR, Est African American: 113 mL/min/{1.73_m2} (ref >=60)
GFR, Est Non African American: 97 mL/min/{1.73_m2} (ref >=60)
Globulin: 3.4 g/dL (calc) (ref 1.9–3.7)
Glucose, Bld: 89 mg/dL (ref 65–99)
Potassium: 3.8 mmol/L (ref 3.5–5.3)
Sodium: 141 mmol/L (ref 135–146)
Total Bilirubin: 0.4 mg/dL (ref 0.2–1.2)
Total Protein: 7.5 g/dL (ref 6.1–8.1)

## 2020-09-04 LAB — ADVANCED WRITTEN NOTIFICATION (AWN) TEST REFUSAL: AWN TEST REFUSED: 10306

## 2020-09-04 LAB — SEDIMENTATION RATE: Sed Rate: 34 mm/h — ABNORMAL HIGH (ref 0–30)

## 2020-09-04 LAB — HIV ANTIBODY (ROUTINE TESTING W REFLEX): HIV 1&2 Ab, 4th Generation: NONREACTIVE

## 2020-09-04 LAB — CYCLIC CITRUL PEPTIDE ANTIBODY, IGG: Cyclic Citrullin Peptide Ab: >250 UNITS — ABNORMAL HIGH

## 2020-09-15 ENCOUNTER — Ambulatory Visit: Payer: BC Managed Care – PPO | Admitting: Internal Medicine

## 2020-09-15 NOTE — Progress Notes (Signed)
Office Visit Note  Patient: Julie Ryan             Date of Birth: Jan 28, 1966           MRN: [id number]             PCP: Fayrene Helper, MD Referring: Fayrene Helper, MD Visit Date: 09/16/2020   Subjective:  Follow-up (Patient denies changes in symptoms since last visit. )   History of Present Illness: Julie Ryan is a 55 y.o. female here with history of T2DM, R-n-Y bypass surgery, GERD and erosive esophagitis here for follow up of rheumatoid arthritis with multiple joint pain and few areas of swelling at last visit with positive serology for RA. Since the last visit her symptoms are about the same. She also complains of the pain in her neck on the right side is bothering her in particular. She was having more arm pain before this is slightly improved. Still has hand stiffness in the mornings.     Review of Systems  Constitutional: Negative for fatigue.  HENT: Negative for mouth sores, mouth dryness and nose dryness.   Eyes: Positive for visual disturbance and dryness. Negative for pain and itching.  Respiratory: Negative for cough, hemoptysis, shortness of breath and difficulty breathing.   Cardiovascular: Negative for chest pain, palpitations and swelling in legs/feet.  Gastrointestinal: Negative for abdominal pain, blood in stool, constipation and diarrhea.  Endocrine: Negative for increased urination.  Genitourinary: Negative for painful urination.  Musculoskeletal: Positive for arthralgias, joint pain, joint swelling and morning stiffness. Negative for myalgias, muscle weakness, muscle tenderness and myalgias.  Skin: Negative for color change, rash and redness.  Allergic/Immunologic: Negative for susceptible to infections.  Neurological: Positive for weakness. Negative for dizziness, numbness, headaches and memory loss.  Hematological: Negative for swollen glands.  Psychiatric/Behavioral: Negative for confusion and sleep disturbance.    PMFS History:  Patient  Active Problem List   Diagnosis Date Noted  . 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 factor ([place]) 08/28/2020  . High risk medication use 08/28/2020  . Neck pain 08/19/2020  . Dermatomycosis 07/01/2020  . Knee instability, left 09/06/2019  . Hiatal hernia 07/17/2019  . 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 Back spasm 04/12/2018  . Endometrial polyp 08/09/2017  . Fibroids 08/09/2017  . Vaginal dryness 07/18/2017  . Leg edema 03/08/2017  . Metabolic syndrome X 51/76/1607  . Erosive esophagitis 06/25/2013  . GERD (gastroesophageal reflux disease) 03/22/2013  . Left knee pain 03/22/2013  . Snoring 08/05/2011  . Urinary incontinence 08/05/2011  . Vitamin D deficiency 03/21/2011  . Allergic rhinitis 09/20/2010  . Type 2 diabetes mellitus with vascular disease ([place]) 04/16/2010  . Back pain 03/17/2010  . MORBID OBESITY 02/11/2010  . Essential hypertension 02/11/2010    Past Medical History:  Diagnosis Date  . Anxiety   . Arthritis    Phreesia 08/19/2020  . Carpal tunnel syndrome of right wrist   . Chronic knee pain   . Depression   . Diabetes mellitus without complication ([place])   . Endometrial polyp 08/09/2017   will get appt with Dr Elonda Husky  . Fibroids 08/09/2017   Has multiple small fibroids   . GERD (gastroesophageal reflux disease)   . Hypertension   . Neuropathy   . Obesity   . Prediabetes   . Seizures ([place])    1 seizure as a chold; unknown etiology and has never been on meds  . Sleep apnea  did not go back to get results  . Snoring    normal/ unremakable sleep study per neurology in 12/2016    Family History  Problem Relation Age of Onset  . Hypertension Mother   . Heart disease Mother   . Diabetes Mother   . Diabetes Sister   . Multiple sclerosis Sister   . Lupus Sister   . Heart disease Sister   . CVA Sister   . Depression Sister   . Diabetes Brother   . Hypertension Brother   . Other Son        brain tumor  .  Hypertension Daughter   . Diabetes Sister    Past Surgical History:  Procedure Laterality Date  . APPENDECTOMY    . CARPAL TUNNEL RELEASE Right 08/01/2015   Procedure: RIGHT CARPAL TUNNEL RELEASE;  Surgeon: Carole Civil, MD;  Location: [facility];  Service: Orthopedics;  Laterality: Right;  . CERVICAL POLYPECTOMY  09/14/2017   Procedure: ENDOMETRIAL POLYPECTOMY;  Surgeon: Florian Buff, MD;  Location: [facility];  Service: Gynecology;;  . COLONOSCOPY N/A 06/15/2017   Procedure: COLONOSCOPY;  Surgeon: Rogene Houston, MD;  Location: [facility];  Service: Endoscopy;  Laterality: N/A;  830  . ENDOMETRIAL ABLATION N/A 09/14/2017   Procedure: ENDOMETRIAL ABLATION( [place]);  Surgeon: Florian Buff, MD;  Location: [facility];  Service: Gynecology;  Laterality: N/A;  . ESOPHAGOGASTRODUODENOSCOPY N/A 05/25/2013   Procedure: ESOPHAGOGASTRODUODENOSCOPY (EGD);  Surgeon: Rogene Houston, MD;  [facility];  Service: Endoscopy;  Laterality: N/A;  220  . GASTRIC BYPASS    . HYSTEROSCOPY WITH D & C N/A 09/14/2017   Procedure: DILATATION AND CURETTAGE /HYSTEROSCOPY;  Surgeon: Florian Buff, MD;  Location: [facility];  Service: Gynecology;  Laterality: N/A;  . TUBAL LIGATION     Social History   Social History Narrative  . Not on file   Immunization History  Administered Date(s) Administered  . Influenza Whole 03/18/2011  . Influenza,inj,Quad PF,6+ Mos 03/22/2013, 07/31/2015, 06/01/2016, 04/12/2018, 05/07/2019, 06/26/2020  . Influenza-Unspecified 02/26/2017  . Moderna Sars-Covid-2 Vaccination 12/05/2019, 01/02/2020, 08/01/2020  . PPD Test 07/04/2013, 07/26/2013, 03/15/2016, 03/02/2017, 03/21/2018, 12/17/2019  . Td 06/24/2010  . Tdap 08/20/2020  . Zoster Recombinat (Shingrix) 02/07/2020, 05/01/2020     Objective: Vital Signs: BP 136/79 (BP Location: Left Arm, Patient Position: Sitting, Cuff Size: Normal)   Pulse 85   Ht 5\' 6"  (1.676 m)   Wt 222 lb 1.6 oz (100.7 kg)   BMI 35.85 kg/m     Physical Exam Constitutional:      Appearance: She is obese.  Cardiovascular:     Rate and Rhythm: Normal rate and regular rhythm.  Skin:    General: Skin is warm and dry.     Findings: No rash.  Neurological:     Mental Status: She is alert.  Psychiatric:        Mood and Affect: Mood normal.     Musculoskeletal Exam:  Neck full ROM, right sided paraspinal muscle tenderness to palpation and pain with lateral rotation Shoulders full ROM no tenderness or swelling Elbows full ROM no tenderness or swelling Wrists full ROM no tenderness or swelling Fingers full ROM, right 1st MCP and 3rd swelling and tenderness, left hand no swelling   CDAI Exam: CDAI Score: 11  Patient Global: 50 mm; Provider Global: 20 mm Swollen: 2 ; Tender: 2  Joint Exam 09/16/2020      Right  Left  MCP 1  Swollen Tender  MCP 3  Swollen Tender        Investigation: No additional findings.  Imaging: MR Cervical Spine Wo Contrast  Result Date: 08/22/2020 CLINICAL DATA:  Neck pain radiating to the upper extremities, worse on the left. EXAM: MRI CERVICAL SPINE WITHOUT CONTRAST TECHNIQUE: Multiplanar, multisequence MR imaging of the cervical spine was performed. No intravenous contrast was administered. COMPARISON:  Cervical spine radiographs 01/01/2020 FINDINGS: Alignment: Cervical spine straightening.  No listhesis. Vertebrae: No fracture, suspicious osseous lesion, or significant marrow edema. Mild disc space narrowing at C4-5 and moderate narrowing at C5-6 and C6-7 with associated chronic degenerative endplate changes. Cord: Normal signal. Posterior Fossa, vertebral arteries, paraspinal tissues: Unremarkable. Disc levels: C2-3: Negative. C3-4: Small central disc protrusion without stenosis. C4-5: A small central disc protrusion slightly indents the ventral spinal cord. No significant stenosis. C5-6: Disc bulging slightly flattens the ventral spinal cord. No significant stenosis. C6-7: Disc bulging and  asymmetric right uncovertebral spurring result in mild-to-moderate right neural foraminal stenosis without significant spinal stenosis. C7-T1: Mild right facet arthrosis without disc herniation or stenosis. IMPRESSION: 1. Multilevel cervical disc degeneration, greatest at C6-7 where there is mild-to-moderate right neural foraminal stenosis. 2. No spinal stenosis. Electronically Signed   By: Logan Bores M.D.   On: 08/22/2020 08:25   XR Hand 2 View Left  Result Date: 08/28/2020 X-ray left hand 2 views Radiocarpal joint space appears normal. Multiple cystic appearing changes present at the carpal bones and base of first metacarpal on radial aspect. MCP PIP and DIP joint spaces all appear normal no osteophyte or erosion seen. Bone mineralization appears normal. No soft tissue swelling seen. Impression Mild first CMC joint osteoarthritis no erosive changes seen  XR Hand 2 View Right  Result Date: 08/28/2020 X-ray right hand 2 views Radiocarpal joint space appears normal. Carpal bones are normal mild first CMC joint degenerative changes with mild subluxation. Probable partial subluxation at first MCP joint without sclerosis or osteophytes. MCP PIP and DIP joint spaces appear normal. Bone mineralization appears normal. No soft tissue swelling seen. Impression Mild first [facility] joint osteoarthritis some increased subluxation at first CMP no erosive changes   Recent Labs: Lab Results  Component Value Date   WBC 8.4 09/02/2020   HGB 14.0 09/02/2020   PLT 342 09/02/2020   NA 141 09/02/2020   K 3.8 09/02/2020   CL 105 09/02/2020   CO2 29 09/02/2020   GLUCOSE 89 09/02/2020   BUN 14 09/02/2020   CREATININE 0.69 09/02/2020   BILITOT 0.4 09/02/2020   ALKPHOS 79 07/27/2019   AST 15 09/02/2020   ALT 17 09/02/2020   PROT 7.5 09/02/2020   ALBUMIN 4.1 07/27/2019   CALCIUM 9.5 09/02/2020   GFRAA 113 09/02/2020   QFTBGOLDPLUS NEGATIVE 09/02/2020    Speciality Comments: No specialty comments  available.  Procedures:  No procedures performed Allergies: Codeine, Effexor xr [venlafaxine hcl er], Fluoxetine, Hydrocodone, and Latex   Assessment / Plan:     Visit Diagnoses: 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 factor ([place]) - Plan: methotrexate 50 WV/3XT injection, folic acid (FOLVITE) 1 MG tablet, TUBERCULIN SYR 1CC/26GX3/8" 26G X 3/8" 1 ML MISC  Symptoms appear consistent with RA with some swollen joints on exam although she does have significant muscular pain issues going on as well that do not appear inflammatory. Recommending [place] methotrexate with her history of bypass surgery and severe GERD. 15 mg [place] weekly and start folic acid 1 mg daily.  Neck pain  Right side neck pain  but localizes to muscles in area without focal tenderness over spine or over true shoulder joints. Provided recommendations for stretching exercise printed out for patient today.  High risk medication use  Methotrexate requires monitoring for toxicity including cytopenias and liver function changes, f/u in 4 wks for repeating monitoring.  Orders: No orders of the defined types were placed in this encounter.  Meds ordered this encounter  Medications  . methotrexate 50 MG/2ML injection    Sig: Inject 0.6 mLs (15 mg total) into the skin once a week.    Dispense:  4 mL    Refill:  0  . folic acid (FOLVITE) 1 MG tablet    Sig: Take 1 tablet (1 mg total) by mouth daily.    Dispense:  90 tablet    Refill:  0  . TUBERCULIN SYR 1CC/26GX3/8" 26G X 3/8" 1 ML MISC    Sig: Use once weekly for methotrexate injection subcutaneously    Dispense:  25 each    Refill:  0    Pharmacy substitution for other 1 mL syringe for [place] injection is acceptable     Follow-Up Instructions: Return in about 4 weeks (around 10/14/2020) for RA new MTX f/u.   Collier Salina, MD  Note - This record has been created using [employer].  Chart creation errors have been sought, but may not always   have been located. Such creation errors do not reflect on  the standard of medical care.

## 2020-09-16 ENCOUNTER — Ambulatory Visit: Payer: BC Managed Care – PPO | Admitting: Internal Medicine

## 2020-09-16 ENCOUNTER — Encounter: Payer: Self-pay | Admitting: Internal Medicine

## 2020-09-16 ENCOUNTER — Other Ambulatory Visit: Payer: Self-pay

## 2020-09-16 VITALS — BP 136/79 | HR 85 | Ht 66.0 in | Wt 222.1 lb

## 2020-09-16 DIAGNOSIS — M05741 Rheumatoid arthritis with rheumatoid factor of right hand without organ or systems involvement: Secondary | ICD-10-CM | POA: Diagnosis not present

## 2020-09-16 DIAGNOSIS — Z79899 Other long term (current) drug therapy: Secondary | ICD-10-CM

## 2020-09-16 DIAGNOSIS — M05742 Rheumatoid arthritis with rheumatoid factor of left hand without organ or systems involvement: Secondary | ICD-10-CM | POA: Diagnosis not present

## 2020-09-16 DIAGNOSIS — M542 Cervicalgia: Secondary | ICD-10-CM | POA: Diagnosis not present

## 2020-09-16 MED ORDER — METHOTREXATE SODIUM CHEMO INJECTION 50 MG/2ML: 15.0000 mg | INTRAMUSCULAR | 0 refills | Status: DC

## 2020-09-16 MED ORDER — FOLIC ACID 1 MG PO TABS: 1.0000 mg | ORAL_TABLET | Freq: Every day | ORAL | 0 refills | Status: DC

## 2020-09-16 MED ORDER — "TUBERCULIN SYRINGE 26G X 3/8"" 1 ML MISC": 0 refills | Status: DC

## 2020-09-16 NOTE — Patient Instructions (Addendum)
Neck Exercises Ask your health care provider which exercises are safe for you. Do exercises exactly as told by your health care provider and adjust them as directed. It is normal to feel mild stretching, pulling, tightness, or discomfort as you do these exercises. Stop right away if you feel sudden pain or your pain gets worse.  Neck exercises can be important for many reasons. They can improve strength and maintain flexibility in your neck, which will help your upper back and prevent neck pain. Stretching exercises Rotation neck stretching 1. Sit in a chair or stand up. 2. Place your feet flat on the floor, shoulder width apart. 3. Slowly turn your head (rotate) to the right until a slight stretch is felt. Turn it all the way to the right so you can look over your right shoulder. Do not tilt or tip your head. 4. Hold this position for 10-30 seconds. 5. Slowly turn your head (rotate) to the left until a slight stretch is felt. Turn it all the way to the left so you can look over your left shoulder. Do not tilt or tip your head. 6. Hold this position for 10-30 seconds. Repeat __________ times. Complete this exercise __________ times a day.    Neck retraction 1. Sit in a sturdy chair or stand up. 2. Look straight ahead. Do not bend your neck. 3. Use your fingers to push your chin backward (retraction). Do not bend your neck for this movement. Continue to face straight ahead. If you are doing the exercise properly, you will feel a slight sensation in your throat and a stretch at the back of your neck. 4. Hold the stretch for 1-2 seconds. Repeat __________ times. Complete this exercise __________ times a day.   Levator scapula stretch   Strengthening exercises Neck press 1. Lie on your back on a firm bed or on the floor with a pillow under your head. 2. Use your neck muscles to push your head down on the pillow and straighten your spine. 3. Hold the position as well as you can. Keep your head  facing up (in a neutral position) and your chin tucked. 4. Slowly count to 5 while holding this position. Repeat __________ times. Complete this exercise __________ times a day.  Scapular retraction 1. Stand with your arms at your sides. Look straight ahead. 2. Slowly pull both shoulders (scapulae) backward and downward (retraction) until you feel a stretch between your shoulder blades in your upper back. 3. Hold for 10-30 seconds. 4. Relax and repeat. Repeat __________ times. Complete this exercise __________ times a day.

## 2020-09-23 ENCOUNTER — Other Ambulatory Visit: Payer: Self-pay | Admitting: Family Medicine

## 2020-10-03 ENCOUNTER — Encounter: Payer: Self-pay | Admitting: Family Medicine

## 2020-10-06 ENCOUNTER — Encounter: Payer: Self-pay | Admitting: Internal Medicine

## 2020-10-06 ENCOUNTER — Telehealth: Payer: BC Managed Care – PPO | Admitting: Internal Medicine

## 2020-10-06 ENCOUNTER — Other Ambulatory Visit: Payer: Self-pay

## 2020-10-06 DIAGNOSIS — J0110 Acute frontal sinusitis, unspecified: Secondary | ICD-10-CM | POA: Diagnosis not present

## 2020-10-06 MED ORDER — AMOXICILLIN-POT CLAVULANATE 875-125 MG PO TABS: 1.0000 | ORAL_TABLET | Freq: Two times a day (BID) | ORAL | 0 refills | Status: DC

## 2020-10-06 NOTE — Progress Notes (Signed)
Virtual Visit via Telephone Note   This visit type was conducted due to national recommendations for restrictions regarding the COVID-19 Pandemic (e.g. social distancing) in an effort to limit this patient's exposure and mitigate transmission in our community.  Due to her co-morbid illnesses, this patient is at least at moderate risk for complications without adequate follow up.  This format is felt to be most appropriate for this patient at this time.  The patient did not have access to video technology/had technical difficulties with video requiring transitioning to audio format only (telephone).  All issues noted in this document were discussed and addressed.  No physical exam could be performed with this format.  Evaluation Performed:  Follow-up visit  Date:  10/06/2020   ID:  Julie Ryan, DOB 1965-09-09, MRN [id number]  Patient Location: Home Provider Location: Office/Clinic  Location of Patient: Home Location of Provider: Telehealth Consent was obtain for visit to be over via telehealth. I verified that I am speaking with the correct person using two identifiers.  PCP:  Fayrene Helper, MD   Chief Complaint:  Headache, cough and sore throat  History of Present Illness:    Julie Ryan is a 55 y.o. female who has a televisit for c/o URTI symptoms.  Upper Respiratory Infection: Patient complains of symptoms of a URI, possible sinusitis. Symptoms include congestion, cough and sore throat. Onset of symptoms was 5 days ago, gradually worsening since that time. She also c/o bilateral ear pressure/pain and headache described as dull for the past 4 days .  She is drinking plenty of fluids. Evaluation to date: none. Treatment to date: cough suppressants. She denies any fever, chills, nausea, vomiting. Denies any dyspnea or wheezing. She is up-to-date with COVID vaccine. Denies any sick contacts.     The patient does not have symptoms concerning for COVID-19 infection (fever,  chills, cough, or new shortness of breath).   Past Medical, Surgical, Social History, Allergies, and Medications have been Reviewed.  Past Medical History:  Diagnosis Date  . Anxiety   . Arthritis    Phreesia 08/19/2020  . Carpal tunnel syndrome of right wrist   . Chronic knee pain   . Depression   . Diabetes mellitus without complication ([place])   . Endometrial polyp 08/09/2017   will get appt with Dr Elonda Husky  . Fibroids 08/09/2017   Has multiple small fibroids   . GERD (gastroesophageal reflux disease)   . Hypertension   . Neuropathy   . Obesity   . Prediabetes   . Seizures ([place])    1 seizure as a chold; unknown etiology and has never been on meds  . Sleep apnea    did not go back to get results  . Snoring    normal/ unremakable sleep study per neurology in 12/2016   Past Surgical History:  Procedure Laterality Date  . APPENDECTOMY    . CARPAL TUNNEL RELEASE Right 08/01/2015   Procedure: RIGHT CARPAL TUNNEL RELEASE;  Surgeon: Carole Civil, MD;  Location: [facility];  Service: Orthopedics;  Laterality: Right;  . CERVICAL POLYPECTOMY  09/14/2017   Procedure: ENDOMETRIAL POLYPECTOMY;  Surgeon: Florian Buff, MD;  Location: [facility];  Service: Gynecology;;  . COLONOSCOPY N/A 06/15/2017   Procedure: COLONOSCOPY;  Surgeon: Rogene Houston, MD;  Location[facility];  Service: Endoscopy;  Laterality: N/A;  830  . ENDOMETRIAL ABLATION N/A 09/14/2017   Procedure: ENDOMETRIAL ABLATION( [place]);  Surgeon: Florian Buff, MD;  Location:  [facility];  Service: Gynecology;  Laterality: N/A;  . ESOPHAGOGASTRODUODENOSCOPY N/A 05/25/2013   Procedure: ESOPHAGOGASTRODUODENOSCOPY (EGD);  Surgeon: Najeeb U Rehman, MD;  [facility];  Service: Endoscopy;  Laterality: N/A;  220  . GASTRIC BYPASS    . HYSTEROSCOPY WITH D & C N/A 09/14/2017   Procedure: DILATATION AND CURETTAGE /HYSTEROSCOPY;  Surgeon: Eure, Luther H, MD;  Location: [facility];  Service: Gynecology;  Laterality: N/A;  . TUBAL  LIGATION       Current Meds  Medication Sig  . acetaminophen-codeine (TYLENOL #4) 300-60 MG tablet   . amLODipine (NORVASC) 5 MG tablet Take 1 tablet (5 mg total) by mouth daily.  . clotrimazole-betamethasone (LOTRISONE) cream APPLY TOPICALLY TO THE AFFECTED AREA TWICE DAILY FOR 5 DAYS THEN AS NEEDED  . cyclobenzaprine (FLEXERIL) 10 MG tablet TAKE 1 TABLET(10 MG) BY MOUTH AT BEDTIME  . diclofenac (VOLTAREN) 75 MG EC tablet TAKE 1 TABLET(75 MG) BY MOUTH TWICE DAILY WITH A MEAL  . estradiol (ESTRACE VAGINAL) 0.1 MG/GM vaginal cream Use 1 applicator in vagina at bedtime 2-3 x weekly  . folic acid (FOLVITE) 1 MG tablet Take 1 tablet (1 mg total) by mouth daily.  . furosemide (LASIX) 20 MG tablet TAKE 1 TABLET BY MOUTH 2 TIMES WEEKLY AS NEEDED FOR LEG AND ANKLE SWELLING  . gabapentin (NEURONTIN) 300 MG capsule TAKE 1 CAPSULE(300 MG) BY MOUTH AT BEDTIME  . LORazepam (ATIVAN) 1 MG tablet Take one tablet 30 minutes before the test, you may repeat one time only 15 minutes after if needed, for anxiety  . methotrexate 50 MG/2ML injection Inject 0.6 mLs (15 mg total) into the skin once a week.  . metoprolol tartrate (LOPRESSOR) 25 MG tablet TAKE 1 TABLET(25 MG) BY MOUTH TWICE DAILY  . NARCAN 4 MG/0.1ML LIQD nasal spray kit SMARTSIG:1 Spray(s) Both Nares Once PRN  . potassium chloride SA (KLOR-CON) 20 MEQ tablet Take one tablet by mouth two times  Weekly, as needed, when taking furosemide for leg swelling  . solifenacin (VESICARE) 10 MG tablet Take 1 tablet (10 mg total) by mouth daily.  . TUBERCULIN SYR 1CC/26GX3/8" 26G X 3/8" 1 ML MISC Use once weekly for methotrexate injection subcutaneously  . vitamin B-12 (CYANOCOBALAMIN) 500 MCG tablet Take 500 mcg by mouth daily.  . amoxicillin-clavulanate (AUGMENTIN) 875-125 MG tablet Take 1 tablet by mouth 2 (two) times daily.     Allergies:   Codeine, Effexor xr [venlafaxine hcl er], Fluoxetine, Hydrocodone, and Latex   ROS:   Please see the history of  present illness.     All other systems reviewed and are negative.   Labs/Other Tests and Data Reviewed:    Recent Labs: 09/02/2020: ALT 17; BUN 14; Creat 0.69; Hemoglobin 14.0; Platelets 342; Potassium 3.8; Sodium 141   Recent Lipid Panel Lab Results  Component Value Date/Time   CHOL 166 02/05/2020 07:08 AM   TRIG 92 02/05/2020 07:08 AM   HDL 59 02/05/2020 07:08 AM   CHOLHDL 2.8 02/05/2020 07:08 AM   LDLCALC 88 02/05/2020 07:08 AM    Wt Readings from Last 3 Encounters:  09/16/20 222 lb 1.6 oz (100.7 kg)  08/28/20 217 lb 3.2 oz (98.5 kg)  08/19/20 214 lb (97.1 kg)      ASSESSMENT & PLAN:    Acute sinusitis Started Augmentin Continue Mucinex for cough Tylenol for fever/headache/myalgias Use humidifier at nighttime Contact if persistent or worsening symptoms  Time:   Today, I have spent 13 minutes reviewing the chart, including   problem list, medications, and with the patient with telehealth technology discussing the above problems.   Medication Adjustments/Labs and Tests Ordered: Current medicines are reviewed at length with the patient today.  Concerns regarding medicines are outlined above.   Tests Ordered: No orders of the defined types were placed in this encounter.   Medication Changes: Meds ordered this encounter  Medications  . amoxicillin-clavulanate (AUGMENTIN) 875-125 MG tablet    Sig: Take 1 tablet by mouth 2 (two) times daily.    Dispense:  20 tablet    Refill:  0     Note: This dictation was prepared with Dragon dictation along with smaller phrase technology. Similar sounding words can be transcribed inadequately or may not be corrected upon review. Any transcriptional errors that result from this process are unintentional.      Disposition:  Follow up  Signed, Lindell Spar, MD  10/06/2020 9:48 AM     [place]

## 2020-10-06 NOTE — Patient Instructions (Signed)

## 2020-10-09 ENCOUNTER — Other Ambulatory Visit: Payer: Self-pay | Admitting: Orthopedic Surgery

## 2020-10-14 ENCOUNTER — Other Ambulatory Visit: Payer: Self-pay

## 2020-10-14 ENCOUNTER — Ambulatory Visit: Payer: BC Managed Care – PPO | Admitting: Internal Medicine

## 2020-10-14 ENCOUNTER — Encounter: Payer: Self-pay | Admitting: Internal Medicine

## 2020-10-14 VITALS — BP 126/84 | HR 71 | Ht 66.0 in | Wt 218.0 lb

## 2020-10-14 DIAGNOSIS — M05742 Rheumatoid arthritis with rheumatoid factor of left hand without organ or systems involvement: Secondary | ICD-10-CM

## 2020-10-14 DIAGNOSIS — M542 Cervicalgia: Secondary | ICD-10-CM | POA: Diagnosis not present

## 2020-10-14 DIAGNOSIS — M05741 Rheumatoid arthritis with rheumatoid factor of right hand without organ or systems involvement: Secondary | ICD-10-CM | POA: Diagnosis not present

## 2020-10-14 DIAGNOSIS — Z79899 Other long term (current) drug therapy: Secondary | ICD-10-CM | POA: Diagnosis not present

## 2020-10-14 NOTE — Progress Notes (Signed)
Office Visit Note  Patient: Julie Ryan             Date of Birth: [birth year]           MRN: [id number]             PCP: Fayrene Helper, MD Referring: Fayrene Helper, MD Visit Date: 10/14/2020   Subjective:  Follow-up (Patient has noticed improvement in symptoms. Patient is doing well on MTX. )   History of Present Illness: Julie Ryan is a 55 y.o. female here for follow up for seropositive RA after starting methotrexate 15 mg [place] weekly and folic acid 1 mg daily. She notices some mouth dryness and altered sense of taste after her medication doses that improves after a few days. Otherwise no trouble tolerating this and she has felt improvement in the neck pain and decreased swelling in her right hand fingers able to wear rings again that previously were not fitting.    Review of Systems  Constitutional: Negative for fatigue.  HENT: Positive for mouth dryness. Negative for mouth sores and nose dryness.        Patient has noticed bumps on her tongue.   Eyes: Negative for pain, itching, visual disturbance and dryness.  Respiratory: Negative for cough, hemoptysis, shortness of breath and difficulty breathing.   Cardiovascular: Negative for chest pain, palpitations and swelling in legs/feet.  Gastrointestinal: Negative for abdominal pain, blood in stool, constipation and diarrhea.  Endocrine: Negative for increased urination.  Genitourinary: Negative for painful urination.  Musculoskeletal: Positive for arthralgias, joint pain, joint swelling and morning stiffness. Negative for myalgias, muscle weakness, muscle tenderness and myalgias.  Skin: Negative for color change, rash and redness.  Allergic/Immunologic: Negative for susceptible to infections.  Neurological: Negative for dizziness, numbness, headaches, memory loss and weakness.  Hematological: Negative for swollen glands.  Psychiatric/Behavioral: Negative for confusion and sleep disturbance.    PMFS History:   Patient Active Problem List   Diagnosis Date Noted  . 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 factor ([place]) 08/28/2020  . High risk medication use 08/28/2020  . Neck pain 08/19/2020  . Dermatomycosis 07/01/2020  . Knee instability, left 09/06/2019  . Hiatal hernia 07/17/2019  . 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 Back spasm 04/12/2018  . Endometrial polyp 08/09/2017  . Fibroids 08/09/2017  . Vaginal dryness 07/18/2017  . Leg edema 03/08/2017  . Metabolic syndrome X 40/97/3532  . Erosive esophagitis 06/25/2013  . GERD (gastroesophageal reflux disease) 03/22/2013  . Left knee pain 03/22/2013  . Snoring 08/05/2011  . Urinary incontinence 08/05/2011  . Vitamin D deficiency 03/21/2011  . Allergic rhinitis 09/20/2010  . Back pain 03/17/2010  . MORBID OBESITY 02/11/2010  . Essential hypertension 02/11/2010    Past Medical History:  Diagnosis Date  . Anxiety   . Arthritis    Phreesia 08/19/2020  . Carpal tunnel syndrome of right wrist   . Chronic knee pain   . Depression   . Diabetes mellitus without complication ([place])   . Endometrial polyp 08/09/2017   will get appt with Dr Elonda Husky  . Fibroids 08/09/2017   Has multiple small fibroids   . GERD (gastroesophageal reflux disease)   . Hypertension   . Neuropathy   . Obesity   . Prediabetes   . Seizures ([place])    1 seizure as a chold; unknown etiology and has never been on meds  . Sleep apnea    did not go back to get  results  . Snoring    normal/ unremakable sleep study per neurology in 12/2016    Family History  Problem Relation Age of Onset  . Hypertension Mother   . Heart disease Mother   . Diabetes Mother   . Diabetes Sister   . Multiple sclerosis Sister   . Lupus Sister   . Heart disease Sister   . CVA Sister   . Depression Sister   . Diabetes Brother   . Hypertension Brother   . Other Son        brain tumor  . Hypertension Daughter   . Diabetes Sister    Past Surgical  History:  Procedure Laterality Date  . APPENDECTOMY    . CARPAL TUNNEL RELEASE Right 08/01/2015   Procedure: RIGHT CARPAL TUNNEL RELEASE;  Surgeon: Carole Civil, MD;  Location: [facility];  Service: Orthopedics;  Laterality: Right;  . CERVICAL POLYPECTOMY  09/14/2017   Procedure: ENDOMETRIAL POLYPECTOMY;  Surgeon: Florian Buff, MD;  Location: [facility];  Service: Gynecology;;  . COLONOSCOPY N/A 06/15/2017   Procedure: COLONOSCOPY;  Surgeon: Rogene Houston, MD;  [facility];  Service: Endoscopy;  Laterality: N/A;  830  . ENDOMETRIAL ABLATION N/A 09/14/2017   Procedure: ENDOMETRIAL ABLATION( [place]);  Surgeon: Florian Buff, MD;  Location: [facility];  Service: Gynecology;  Laterality: N/A;  . ESOPHAGOGASTRODUODENOSCOPY N/A 05/25/2013   Procedure: ESOPHAGOGASTRODUODENOSCOPY (EGD);  Surgeon: Rogene Houston, MD;  [facility];  Service: Endoscopy;  Laterality: N/A;  220  . GASTRIC BYPASS    . HYSTEROSCOPY WITH D & C N/A 09/14/2017   Procedure: DILATATION AND CURETTAGE /HYSTEROSCOPY;  Surgeon: Florian Buff, MD;  Location: [facility];  Service: Gynecology;  Laterality: N/A;  . TUBAL LIGATION     Social History   Social History Narrative  . Not on file   Immunization History  Administered Date(s) Administered  . Influenza Whole 03/18/2011  . Influenza,inj,Quad PF,6+ Mos 03/22/2013, 07/31/2015, 06/01/2016, 04/12/2018, 05/07/2019, 06/26/2020  . Influenza-Unspecified 02/26/2017  . Moderna Sars-Covid-2 Vaccination 12/05/2019, 01/02/2020, 08/01/2020  . PPD Test 07/04/2013, 07/26/2013, 03/15/2016, 03/02/2017, 03/21/2018, 12/17/2019  . Td 06/24/2010  . Tdap 08/20/2020  . Zoster Recombinat (Shingrix) 02/07/2020, 05/01/2020     Objective: Vital Signs: BP 126/84 (BP Location: Left Arm, Patient Position: Sitting, Cuff Size: Large)   Pulse 71   Ht 5\' 6"  (1.676 m)   Wt 218 lb (98.9 kg)   BMI 35.19 kg/m    Physical Exam Constitutional:      Appearance: She is obese.  Eyes:      Conjunctiva/sclera: Conjunctivae normal.  Skin:    General: Skin is warm and dry.     Findings: No rash.  Neurological:     General: No focal deficit present.     Mental Status: She is alert.  Psychiatric:        Mood and Affect: Mood normal.     Musculoskeletal Exam: Neck full ROM no tenderness Shoulders full ROM no tenderness or swelling Elbows full ROM no tenderness or swelling Wrists full ROM no tenderness or swelling Fingers full ROM right 1st-2nd MCP swelling thumb is tender, left 3rd PIP swelling and 3rd digit deformity Knees full ROM no tenderness or swelling    CDAI Exam: CDAI Score: 13  Patient Global: 60 mm; Provider Global: 30 mm Swollen: 3 ; Tender: 1  Joint Exam 10/14/2020      Right  Left  MCP 1  Swollen Tender  MCP 2  Swollen      PIP 3     Swollen      Investigation: No additional findings.  Imaging: No results found.  Recent Labs: Lab Results  Component Value Date   WBC 8.0 10/14/2020   HGB 14.2 10/14/2020   PLT 350 10/14/2020   NA 139 10/14/2020   K 4.8 10/14/2020   CL 101 10/14/2020   CO2 29 10/14/2020   GLUCOSE 84 10/14/2020   BUN 16 10/14/2020   CREATININE 0.78 10/14/2020   BILITOT 0.9 10/14/2020   ALKPHOS 79 07/27/2019   AST 312 (H) 10/14/2020   ALT 650 (H) 10/14/2020   PROT 7.7 10/14/2020   ALBUMIN 4.1 07/27/2019   CALCIUM 10.0 10/14/2020   GFRAA 98 10/14/2020   QFTBGOLDPLUS NEGATIVE 09/02/2020    Speciality Comments: No specialty comments available.  Procedures:  No procedures performed Allergies: Codeine, Effexor xr [venlafaxine hcl er], Fluoxetine, Hydrocodone, and Latex   Assessment / Plan:     Visit Diagnoses: 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 factor ([place]) - Plan: Sedimentation rate  Symptoms show improvement since starting methotrexate with reduced finger swelling although active disease on exam still early for drug efficacy. Plan to continue MTX 15 mg [place] weekly and folic  acid 1 mg daily decide efficacy by 8 wks f/u.  High risk medication use - Plan: CBC with Differential/Platelet, COMPLETE METABOLIC PANEL WITH GFR, Hepatitis B core antibody, IgM, Hepatitis B surface antigen, Hepatitis C antibody  Methotrexate toxicity monitoring with CBC, CMP ordered today. Also her previously ordered hepatitis panel was not collected due to insurance will reorder today need to rule out for continue MTX use.  Orders: Orders Placed This Encounter  Procedures  . Sedimentation rate  . CBC with Differential/Platelet  . COMPLETE METABOLIC PANEL WITH GFR  . Hepatitis B core antibody, IgM  . Hepatitis B surface antigen  . Hepatitis C antibody   No orders of the defined types were placed in this encounter.    Follow-Up Instructions: Return in about 2 months (around 12/14/2020) for RA f/u @3mos  new MTX.   Collier Salina, MD  Note - This record has been created using [employer].  Chart creation errors have been sought, but may not always  have been located. Such creation errors do not reflect on  the standard of medical care.

## 2020-10-15 LAB — COMPLETE METABOLIC PANEL WITH GFR
AG Ratio: 1.2 (calc) (ref 1.0–2.5)
ALT: 650 U/L — ABNORMAL HIGH (ref 6–29)
AST: 312 U/L — ABNORMAL HIGH (ref 10–35)
Albumin: 4.2 g/dL (ref 3.6–5.1)
Alkaline phosphatase (APISO): 243 U/L — ABNORMAL HIGH (ref 37–153)
BUN: 16 mg/dL (ref 7–25)
CO2: 29 mmol/L (ref 20–32)
Calcium: 10 mg/dL (ref 8.6–10.4)
Chloride: 101 mmol/L (ref 98–110)
Creat: 0.78 mg/dL (ref 0.50–1.05)
GFR, Est African American: 98 mL/min/{1.73_m2} (ref >=60)
GFR, Est Non African American: 85 mL/min/{1.73_m2} (ref >=60)
Globulin: 3.5 g/dL (calc) (ref 1.9–3.7)
Glucose, Bld: 84 mg/dL (ref 65–99)
Potassium: 4.8 mmol/L (ref 3.5–5.3)
Sodium: 139 mmol/L (ref 135–146)
Total Bilirubin: 0.9 mg/dL (ref 0.2–1.2)
Total Protein: 7.7 g/dL (ref 6.1–8.1)

## 2020-10-15 LAB — CBC WITH DIFFERENTIAL/PLATELET
Absolute Monocytes: 416 cells/uL (ref 200–950)
Basophils Absolute: 40 cells/uL (ref 0–200)
Basophils Relative: 0.5 %
Eosinophils Absolute: 312 cells/uL (ref 15–500)
Eosinophils Relative: 3.9 %
HCT: 43 % (ref 35.0–45.0)
Hemoglobin: 14.2 g/dL (ref 11.7–15.5)
Lymphs Abs: 2536 cells/uL (ref 850–3900)
MCH: 30.5 pg (ref 27.0–33.0)
MCHC: 33 g/dL (ref 32.0–36.0)
MCV: 92.5 fL (ref 80.0–100.0)
MPV: 9.7 fL (ref 7.5–12.5)
Monocytes Relative: 5.2 %
Neutro Abs: 4696 cells/uL (ref 1500–7800)
Neutrophils Relative %: 58.7 %
Platelets: 350 10*3/uL (ref 140–400)
RBC: 4.65 10*6/uL (ref 3.80–5.10)
RDW: 13.3 % (ref 11.0–15.0)
Total Lymphocyte: 31.7 %
WBC: 8 10*3/uL (ref 3.8–10.8)

## 2020-10-15 LAB — HEPATITIS C ANTIBODY
Hepatitis C Ab: NONREACTIVE
SIGNAL TO CUT-OFF: 0.01 (ref <1.00)

## 2020-10-15 LAB — HEPATITIS B CORE ANTIBODY, IGM: Hep B C IgM: NONREACTIVE

## 2020-10-15 LAB — HEPATITIS B SURFACE ANTIGEN: Hepatitis B Surface Ag: NONREACTIVE

## 2020-10-15 LAB — SEDIMENTATION RATE: Sed Rate: 45 mm/h — ABNORMAL HIGH (ref 0–30)

## 2020-10-15 NOTE — Progress Notes (Signed)
Julie Ryan needs to discontinue methotrexate her liver function tests show a new abnormality that could represent inflammation related to the medicine. We will need to check her blood tests again 2 weeks after stopping the medicine and make sure these go back down after removing it, and that this is not some other new problem.

## 2020-10-17 ENCOUNTER — Other Ambulatory Visit: Payer: Self-pay | Admitting: Internal Medicine

## 2020-10-17 DIAGNOSIS — M05741 Rheumatoid arthritis with rheumatoid factor of right hand without organ or systems involvement: Secondary | ICD-10-CM

## 2020-10-19 ENCOUNTER — Other Ambulatory Visit: Payer: Self-pay | Admitting: Family Medicine

## 2020-10-29 ENCOUNTER — Other Ambulatory Visit: Payer: Self-pay | Admitting: Internal Medicine

## 2020-10-29 DIAGNOSIS — M05741 Rheumatoid arthritis with rheumatoid factor of right hand without organ or systems involvement: Secondary | ICD-10-CM

## 2020-10-29 DIAGNOSIS — M05742 Rheumatoid arthritis with rheumatoid factor of left hand without organ or systems involvement: Secondary | ICD-10-CM

## 2020-10-29 NOTE — Telephone Encounter (Signed)
Julie Ryan needs to discontinue the methotrexate due to her significant liver function test values more than 10 times upper normal limit.  We should follow-up for repeat labs to make sure these returned to normal after discontinuing the medicine.  We could also then discuss alternative treatment options.

## 2020-10-29 NOTE — Telephone Encounter (Signed)
Next Visit: *Nothing scheduled, when is patient due for f/u? 8 weeks after 10/14/2020 or 8 weeks after starting MTX?  Last Visit: 10/14/2020  Last Fill: 09/16/2020  DX: 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 factor  Current Dose per office note 10/14/2020: MTX 15 mg  weekly   Labs: 10/14/2020- CBC & CMP  Okay to refill MTX?

## 2020-11-03 ENCOUNTER — Other Ambulatory Visit: Payer: Self-pay | Admitting: Radiology

## 2020-11-03 ENCOUNTER — Telehealth: Payer: Self-pay

## 2020-11-03 DIAGNOSIS — Z79899 Other long term (current) drug therapy: Secondary | ICD-10-CM

## 2020-11-03 NOTE — Telephone Encounter (Signed)
Hepatic function panel is all that's needed, correct? I have put in a future order for patient to have lab drawn at [facility] tomorrow.

## 2020-11-03 NOTE — Telephone Encounter (Signed)
Patient called requesting labwork orders be sent to [facility] in [place].  Patient states she will be going tomorrow morning 11/04/20.

## 2020-11-04 ENCOUNTER — Other Ambulatory Visit: Payer: Self-pay | Admitting: Radiology

## 2020-11-04 DIAGNOSIS — Z79899 Other long term (current) drug therapy: Secondary | ICD-10-CM

## 2020-11-04 NOTE — Telephone Encounter (Signed)
Yes that is all that is needed

## 2020-11-05 LAB — HEPATIC FUNCTION PANEL
AG Ratio: 1.4 (calc) (ref 1.0–2.5)
ALT: 15 U/L (ref 6–29)
AST: 16 U/L (ref 10–35)
Albumin: 4 g/dL (ref 3.6–5.1)
Alkaline phosphatase (APISO): 116 U/L (ref 37–153)
Bilirubin, Direct: 0.1 mg/dL (ref 0.0–0.2)
Globulin: 2.8 g/dL (calc) (ref 1.9–3.7)
Indirect Bilirubin: 0.5 mg/dL (calc) (ref 0.2–1.2)
Total Bilirubin: 0.6 mg/dL (ref 0.2–1.2)
Total Protein: 6.8 g/dL (ref 6.1–8.1)

## 2020-11-05 NOTE — Progress Notes (Signed)
Lab test shows the liver function tests are completely back to normal now after stopping the methotrexate. This indicates the drug was probably the cause of these lab changes so she should definitely not restart this medicine. We can schedule a new follow up soon to discuss an alternative medicine or she can wait a little while to see if symptoms change.

## 2020-11-11 ENCOUNTER — Encounter: Payer: Self-pay | Admitting: Internal Medicine

## 2020-11-11 ENCOUNTER — Other Ambulatory Visit: Payer: Self-pay

## 2020-11-11 ENCOUNTER — Ambulatory Visit: Payer: BC Managed Care – PPO | Admitting: Internal Medicine

## 2020-11-11 VITALS — BP 119/72 | HR 82 | Ht 66.0 in | Wt 224.6 lb

## 2020-11-11 DIAGNOSIS — M05742 Rheumatoid arthritis with rheumatoid factor of left hand without organ or systems involvement: Secondary | ICD-10-CM | POA: Diagnosis not present

## 2020-11-11 DIAGNOSIS — M05741 Rheumatoid arthritis with rheumatoid factor of right hand without organ or systems involvement: Secondary | ICD-10-CM

## 2020-11-11 DIAGNOSIS — K2210 Ulcer of esophagus without bleeding: Secondary | ICD-10-CM

## 2020-11-11 DIAGNOSIS — Z9884 Bariatric surgery status: Secondary | ICD-10-CM

## 2020-11-11 NOTE — Progress Notes (Signed)
Office Visit Note  Patient: Julie Ryan             Date of Birth: 05-Dec-1965           MRN: [id number]             PCP: Fayrene Helper, MD Referring: Fayrene Helper, MD Visit Date: 11/11/2020   Subjective:  Follow-up (Patient has discontinued MTX due to liver function tests. Patient would like to discuss other treatment options. )   History of Present Illness: Julie Ryan is a 55 y.o. female here for follow up for seropositive rheumatoid arthritis.  She previously started subcutaneous methotrexate 15 mg weekly and had described a improvement in her joint pain swelling and stiffness especially in the bilateral hands but this medicine was stopped due to a considerable rise in transaminase levels on repeat labs.  She has been off any medicine for over 2 weeks now and she does feel like her joint pain and stiffness is getting worse again.   Review of Systems  Constitutional: Positive for fatigue.  HENT: Positive for mouth dryness. Negative for mouth sores and nose dryness.   Eyes: Positive for pain, itching and dryness. Negative for visual disturbance.  Respiratory: Negative for cough, hemoptysis, shortness of breath and difficulty breathing.   Cardiovascular: Positive for chest pain. Negative for palpitations and swelling in legs/feet.  Gastrointestinal: Negative for abdominal pain, blood in stool, constipation and diarrhea.  Endocrine: Negative for increased urination.  Genitourinary: Negative for painful urination.  Musculoskeletal: Positive for arthralgias, joint pain, joint swelling, myalgias, muscle weakness, morning stiffness and myalgias. Negative for muscle tenderness.  Skin: Negative for color change, rash and redness.  Allergic/Immunologic: Negative for susceptible to infections.  Neurological: Positive for weakness. Negative for dizziness, numbness, headaches and memory loss.  Hematological: Negative for swollen glands.  Psychiatric/Behavioral: Negative for  confusion and sleep disturbance.    PMFS History:  Patient Active Problem List   Diagnosis Date Noted  . 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 factor ([place]) 08/28/2020  . High risk medication use 08/28/2020  . Neck pain 08/19/2020  . Dermatomycosis 07/01/2020  . Knee instability, left 09/06/2019  . Hiatal hernia 07/17/2019  . 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 Back spasm 04/12/2018  . Endometrial polyp 08/09/2017  . Fibroids 08/09/2017  . Vaginal dryness 07/18/2017  . Leg edema 03/08/2017  . Metabolic syndrome X 78/29/5621  . Erosive esophagitis 06/25/2013  . GERD (gastroesophageal reflux disease) 03/22/2013  . Left knee pain 03/22/2013  . Snoring 08/05/2011  . Urinary incontinence 08/05/2011  . Vitamin D deficiency 03/21/2011  . Allergic rhinitis 09/20/2010  . Back pain 03/17/2010  . MORBID OBESITY 02/11/2010  . Essential hypertension 02/11/2010    Past Medical History:  Diagnosis Date  . Anxiety   . Arthritis    Phreesia 08/19/2020  . Carpal tunnel syndrome of right wrist   . Chronic knee pain   . Depression   . Diabetes mellitus without complication ([place])   . Endometrial polyp 08/09/2017   will get appt with Dr Elonda Husky  . Fibroids 08/09/2017   Has multiple small fibroids   . GERD (gastroesophageal reflux disease)   . Hypertension   . Neuropathy   . Obesity   . Prediabetes   . Seizures ([place])    1 seizure as a chold; unknown etiology and has never been on meds  . Sleep apnea    did not go back to get results  .  Snoring    normal/ unremakable sleep study per neurology in 12/2016    Family History  Problem Relation Age of Onset  . Hypertension Mother   . Heart disease Mother   . Diabetes Mother   . Diabetes Sister   . Multiple sclerosis Sister   . Lupus Sister   . Heart disease Sister   . CVA Sister   . Depression Sister   . Diabetes Brother   . Hypertension Brother   . Other Son        brain tumor  .  Hypertension Daughter   . Diabetes Sister    Past Surgical History:  Procedure Laterality Date  . APPENDECTOMY    . CARPAL TUNNEL RELEASE Right 08/01/2015   Procedure: RIGHT CARPAL TUNNEL RELEASE;  Surgeon: Carole Civil, MD;  Location: [facility];  Service: Orthopedics;  Laterality: Right;  . CERVICAL POLYPECTOMY  09/14/2017   Procedure: ENDOMETRIAL POLYPECTOMY;  Surgeon: Florian Buff, MD;  Location: [facility];  Service: Gynecology;;  . COLONOSCOPY N/A 06/15/2017   Procedure: COLONOSCOPY;  Surgeon: Rogene Houston, MD;  Location[facility];  Service: Endoscopy;  Laterality: N/A;  830  . ENDOMETRIAL ABLATION N/A 09/14/2017   Procedure: ENDOMETRIAL ABLATION( [place]);  Surgeon: Florian Buff, MD;  Location: [facility];  Service: Gynecology;  Laterality: N/A;  . ESOPHAGOGASTRODUODENOSCOPY N/A 05/25/2013   Procedure: ESOPHAGOGASTRODUODENOSCOPY (EGD);  Surgeon: Rogene Houston, MD;  [facility];  Service: Endoscopy;  Laterality: N/A;  220  . GASTRIC BYPASS    . HYSTEROSCOPY WITH D & C N/A 09/14/2017   Procedure: DILATATION AND CURETTAGE /HYSTEROSCOPY;  Surgeon: Florian Buff, MD;  Location: [facility];  Service: Gynecology;  Laterality: N/A;  . TUBAL LIGATION     Social History   Social History Narrative  . Not on file   Immunization History  Administered Date(s) Administered  . Influenza Whole 03/18/2011  . Influenza,inj,Quad PF,6+ Mos 03/22/2013, 07/31/2015, 06/01/2016, 04/12/2018, 05/07/2019, 06/26/2020  . Influenza-Unspecified 02/26/2017  . Moderna Sars-Covid-2 Vaccination 12/05/2019, 01/02/2020, 08/01/2020  . PPD Test 07/04/2013, 07/26/2013, 03/15/2016, 03/02/2017, 03/21/2018, 12/17/2019  . Td 06/24/2010  . Tdap 08/20/2020  . Zoster Recombinat (Shingrix) 02/07/2020, 05/01/2020     Objective: Vital Signs: BP 119/72 (BP Location: Left Arm, Patient Position: Sitting, Cuff Size: Large)   Pulse 82   Ht 5\' 6"  (1.676 m)   Wt 224 lb 9.6 oz (101.9 kg)   BMI 36.25 kg/m     Physical Exam Constitutional:      Appearance: She is obese.  Eyes:     Conjunctiva/sclera: Conjunctivae normal.  Skin:    General: Skin is warm and dry.     Findings: No rash.  Neurological:     General: No focal deficit present.     Mental Status: She is alert.  Psychiatric:        Mood and Affect: Mood normal.    Musculoskeletal Exam:  Shoulders full ROM no tenderness or swelling Elbows full ROM no tenderness or swelling Wrists full ROM no tenderness or swelling Fingers right hand 1st MCP swollen and tender, 2nd MCP swollen, 3rd DIP tenderness left hand 3rd PIP is swollen and tender and boutonniere's deformity of the finger Knees extensive patellofemoral crepitus, tenderness around medial joint line, no swelling appreciated Ankles full ROM no tenderness or swelling   CDAI Exam: CDAI Score: 13  Patient Global: 30 mm; Provider Global: 20 mm Swollen: 3 ; Tender: 5  Joint Exam 11/11/2020  Right  Left  MCP 1  Swollen Tender     MCP 2  Swollen      PIP 3   Tender  Swollen Tender  Knee   Tender   Tender     Investigation: No additional findings.  Imaging: No results found.  Recent Labs: Lab Results  Component Value Date   WBC 8.0 10/14/2020   HGB 14.2 10/14/2020   PLT 350 10/14/2020   NA 139 10/14/2020   K 4.8 10/14/2020   CL 101 10/14/2020   CO2 29 10/14/2020   GLUCOSE 84 10/14/2020   BUN 16 10/14/2020   CREATININE 0.78 10/14/2020   BILITOT 0.6 11/04/2020   ALKPHOS 79 07/27/2019   AST 16 11/04/2020   ALT 15 11/04/2020   PROT 6.8 11/04/2020   ALBUMIN 4.1 07/27/2019   CALCIUM 10.0 10/14/2020   GFRAA 98 10/14/2020   QFTBGOLDPLUS NEGATIVE 09/02/2020    Speciality Comments: No specialty comments available.  Procedures:  No procedures performed Allergies: Codeine, Effexor xr [venlafaxine hcl er], Fluoxetine, Hydrocodone, Methotrexate derivatives, and Latex   Assessment / Plan:     Visit Diagnoses: Rheumatoid arthritis involving both hands with  positive rheumatoid factor ([place])  Seropositive RA now moderately active due to withdrawal of methotrexate on account of transaminitis.  We discussed recommendation to start an alternative treatment due to the increase in her current symptoms also to prevent further joint deformity.  Recommend starting treatment with Enbrel 50 mg subcu weekly she agrees with current plan. We discussed treatment side effect risks including infections, malignancy, heart failure, cytopenias, transaminitis, or injection site reactions and need for monitoring.  Erosive esophagitis History of Roux-en-Y gastric bypass  She has history of erosive esophagitis also Roux-en-Y gastric bypass surgery so recommendations are for subcutaneous therapy preferentially due to low tolerance of GI side effects and less oral bioavailability.  Orders: No orders of the defined types were placed in this encounter.  No orders of the defined types were placed in this encounter.    Follow-Up Instructions: No follow-ups on file.   Collier Salina, MD  Note - This record has been created using [employer].  Chart creation errors have been sought, but may not always  have been located. Such creation errors do not reflect on  the standard of medical care.

## 2020-11-19 ENCOUNTER — Encounter: Payer: Self-pay | Admitting: Internal Medicine

## 2020-11-19 ENCOUNTER — Telehealth: Payer: Self-pay | Admitting: Pharmacist

## 2020-11-19 NOTE — Telephone Encounter (Signed)
Please start Enbrel Mini BIV. **please make urgent if there's an option on the auth**  Dose: 50mg  every 7 days  Dx: M05.71 (RA)  Previously tried therapies: . Methotrexate - stopped d/t elevated transaminases . Has not tried any biologics DMARDs  Not currently taking anything  Knox Saliva, PharmD, MPH Clinical Pharmacist (Rheumatology and Pulmonology)

## 2020-11-19 NOTE — Telephone Encounter (Signed)
Submitted an URGENT Prior Authorization request to [facility] for ENBREL via CoverMyMeds. Will update once we receive a response.   Key: [id number]

## 2020-11-19 NOTE — Telephone Encounter (Signed)
Received notification from [facility] regarding a prior authorization for ENBREL. Authorization has been APPROVED from 11/19/2020 to 11/19/2021.   Plan requires that medication be filled [facility].  Authorization # [id number] RS Phone # [phone] Fax# [phone]

## 2020-11-20 NOTE — Telephone Encounter (Signed)
Signed patient up for Enbrel copay card and Nurse Partner Program with her consent.  Patient scheduled for Enbrel new start on 11/26/20 with pharmacist  Knox Saliva, PharmD, MPH Clinical Pharmacist (Rheumatology and Pulmonology)

## 2020-11-25 ENCOUNTER — Telehealth: Payer: Self-pay

## 2020-11-25 NOTE — Telephone Encounter (Signed)
Patient left a voicemail stating she has an appointment tomorrow 11/26/20 at 9:00 and wanted to make sure the office received her medication.  Patient requested a return call.

## 2020-11-25 NOTE — Telephone Encounter (Signed)
We will use sample for new start of Enbrel tomorrow. Called patient but unable to reach. Left VM to advise  Knox Saliva, PharmD, MPH Clinical Pharmacist (Rheumatology and Pulmonology)

## 2020-11-26 ENCOUNTER — Ambulatory Visit: Payer: BC Managed Care – PPO | Admitting: Pharmacist

## 2020-11-26 ENCOUNTER — Encounter: Payer: Self-pay | Admitting: Pharmacist

## 2020-11-26 ENCOUNTER — Encounter: Payer: Self-pay | Admitting: Family Medicine

## 2020-11-26 ENCOUNTER — Other Ambulatory Visit: Payer: Self-pay

## 2020-11-26 VITALS — BP 144/74 | HR 64

## 2020-11-26 DIAGNOSIS — M05742 Rheumatoid arthritis with rheumatoid factor of left hand without organ or systems involvement: Secondary | ICD-10-CM

## 2020-11-26 DIAGNOSIS — Z79899 Other long term (current) drug therapy: Secondary | ICD-10-CM

## 2020-11-26 DIAGNOSIS — M05741 Rheumatoid arthritis with rheumatoid factor of right hand without organ or systems involvement: Secondary | ICD-10-CM

## 2020-11-26 MED ORDER — ENBREL MINI 50 MG/ML ~~LOC~~ SOCT: 50.0000 mg | SUBCUTANEOUS | 0 refills | Status: DC

## 2020-11-26 NOTE — Patient Instructions (Addendum)
Your next Enbrel dose is due on 6/1, 6/8, and every 7 days thereafter  Your prescription will be shipped from [facility]. Their phone number is: [phone] .  Please call to schedule shipment and confirm address this evening or tomorrow morning - provide them with your copay card information.  They will mail medication to your home.  Your copay should be affordable. If you call the pharmacy and it is not affordable, please double-check that they are billing through your Enbrel copay/debit card as secondary coverage.  Labs are due in 1 month then every 3 months.  Remember the 5 C's:  COUNTER - leave on the counter at least 30 minutes but up to overnight to bring medication to room temperature. This may help prevent stinging  COLD - place something cold (like an ice gel pack or cold water bottle) on the injection site just before cleansing with alcohol. This may help reduce pain  CLARITIN - use Claritin (generic name is loratadine) for the first two weeks of treatment or the day of, the day before, and the day after injecting. This will help to minimize injection site reactions  CORTISONE CREAM - apply if injection site is irritated and itching  CALL ME - if injection site reaction is bigger than the size of your fist, looks infected, blisters, or if you develop hives  If you test POSITIVE for COVID19 and have MILD to MODERATE symptoms: o First, call your PCP if you would like to receive COVID19 treatment AND o Hold your medications during the infection and for at least 1 week after your symptoms have resolved: - Injectable medication (Benlysta, Cimzia, Cosentyx, Enbrel, Humira, Orencia, Remicade, Simponi, Stelara, Taltz, Tremfya) - Methotrexate  If you test POSITIVE for COVID19 and have NO symptoms: o First, call your PCP if you would like to receive COVID19 treatment AND o Hold your medications for at least 10 days after the day that you tested positive - Injectable  medication (Benlysta, Cimzia, Cosentyx, Enbrel, Humira, Orencia, Remicade, Simponi, Stelara, Taltz, Tremfya) - Methotrexate  If you have signs or symptoms of an infection or start antibiotics: . First, call your PCP for workup of your infection. . Hold your medication through the infection, until you complete your antibiotics, and until symptoms resolve if you take the following: o Injectable medication (Actemra, Benlysta, Cimzia, Cosentyx, Enbrel, Humira, Kevzara, Orencia, Remicade, Simponi, Stelara, Taltz, Tremfya) o Methotrexate

## 2020-11-26 NOTE — Progress Notes (Signed)
Pharmacy Note  Subjective:   Julie Ryan presents to clinic today to receive first dose of Enbrel Mini for rheumatoid arthritis. PMH significant for T2DM, depression, anxiety, and history of Roux-en-Y gastric bypass, GERD, erosive esophagitis. She has stopped methotrexate d/t elevated LFTs which resolved to be WNL after holding MTX for 2 weeks. She states she had improvement while on MTX but understands why she is switching to Enbrel now. She states the injections were slightly bothersome and stung though she did not have any reaction that warranted discontinuing other than elevated LFTs.  Patient running a fever or have signs/symptoms of infection? No  Patient currently on antibiotics for the treatment of infection? No  Patient have any upcoming invasive procedures/surgeries? No  Objective: CMP     Component Value Date/Time   NA 139 10/14/2020 1331   K 4.8 10/14/2020 1331   CL 101 10/14/2020 1331   CO2 29 10/14/2020 1331   GLUCOSE 84 10/14/2020 1331   BUN 16 10/14/2020 1331   CREATININE 0.78 10/14/2020 1331   CALCIUM 10.0 10/14/2020 1331   PROT 6.8 11/04/2020 0842   ALBUMIN 4.1 07/27/2019 1730   AST 16 11/04/2020 0842   ALT 15 11/04/2020 0842   ALKPHOS 79 07/27/2019 1730   BILITOT 0.6 11/04/2020 0842   GFRNONAA 85 10/14/2020 1331   GFRAA 98 10/14/2020 1331    CBC    Component Value Date/Time   WBC 8.0 10/14/2020 1331   RBC 4.65 10/14/2020 1331   HGB 14.2 10/14/2020 1331   HCT 43.0 10/14/2020 1331   PLT 350 10/14/2020 1331   MCV 92.5 10/14/2020 1331   MCH 30.5 10/14/2020 1331   MCHC 33.0 10/14/2020 1331   RDW 13.3 10/14/2020 1331   LYMPHSABS 2,536 10/14/2020 1331   MONOABS 0.8 07/27/2019 1730   EOSABS 312 10/14/2020 1331   BASOSABS 40 10/14/2020 1331    Baseline Immunosuppressant Therapy Labs TB GOLD Quantiferon TB Gold Latest Ref Rng & Units 09/02/2020  Quantiferon TB Gold Plus NEGATIVE NEGATIVE   Hepatitis Panel Hepatitis Latest Ref Rng & Units 10/14/2020  Hep  B Surface Ag NON-REACTI NON-REACTIVE  Hep B IgM NON-REACTI NON-REACTIVE  Hep C Ab 0.0 - 0.9 s/co ratio -  Hep C Ab NON-REACTI NON-REACTIVE  Hep C Ab NON-REACTI NON-REACTIVE   HIV Lab Results  Component Value Date   HIV NON-REACTIVE 09/02/2020   HIV NON REACTIVE 09/07/2017   Immunoglobulins   SPEP Serum Protein Electrophoresis Latest Ref Rng & Units 11/04/2020  Total Protein 6.1 - 8.1 g/dL 6.8   Chest x-ray: 08/08/14 - no active cardiopulmonary disease  Assessment/Plan:  Counseled patient that Enbrel is a TNF blocking agent.  Counseled patient on purpose, proper use, and adverse effects of Enbrel.  Reviewed the most common adverse effects including infections, headache, and injection site reactions.  Discussed that there is the possibility of an increased risk of malignancy including non-melanoma skin cancer but it is not well understood if this increased risk is due to the medication or the disease state.  Advised patient to get yearly dermatology exams due to risk of skin cancer.  Counseled patient that Enbrel should be held prior to scheduled surgery.  Counseled patient to avoid live vaccines while on Enbrel.  Recommend annual influenza, PCV 15 or PCV20 or Pneumovax 23, and Shingrix as indicated. She has received 3 COVID19 vaccines and will be due for 2nd booster in July 2022. She is UTD on zoster, Td/Tdap, and influenza vaccine.  Reviewed the importance of regular  labs while on Enbrel therapy.  Will monitor CBC and CMP 1 month after starting and then every 3 months routinely thereafter. Will monitor TB gold annually. Next TB gold due March 2023.   Standing orders placed.  Provided patient with medication education material and answered all questions.  Patient consented to Enbrel.  Will upload consent into the media tab.  Reviewed storage instructions for Enbrel.  Advised initial injection must be administered in office.    Dermatology referral was placed today. for yearly routine skin exams  while on TNF inhibitor.  Demonstrated proper injection technique with Enbrel demo device  Patient able to demonstrate proper injection technique using the teach back method.  Patient self injected in the right lower abdomen with:  Sample Medication: Enbrel Mini Cartridge 50mg /mL NDC: 96045-409-81 Lot: [id number] Expiration: 02/2023  Patient provided with Enbrel Autoinjector Device (used today and she took home today). She was provided with Bluetooth capable device and will work with her daughter to set up Bluetooth resources. [name]: [address]-57 Lot: [id number] Exp: 03/05/23  Patient tolerated well.  Observed for 30 mins in office for adverse reaction and none noted  Patient is to return in 1 month for labs and 6-8 weeks for follow-up appointment.  Standing orders placed.   Enbrel approved through insurance through 11/19/21.  Prescription sent to [facility] and patient provided with pharmacy information. Patient enrolled in Enbrel copay card and Nurse Partner Program - she has already connected with her nurse and received her debit card at home. Advised her to provide this information to the pharmacy when she calls to schedule shipment and also to advise that   All questions encouraged and answered.  Instructed patient to call with any further questions or concerns.  Knox Saliva, PharmD, MPH Clinical Pharmacist (Rheumatology and Pulmonology)  11/26/2020 8:17 AM

## 2020-12-10 ENCOUNTER — Telehealth: Payer: Self-pay

## 2020-12-10 NOTE — Telephone Encounter (Signed)
Beth from Enbrel Nurse Partner Program called to let Dr. Benjamine Mola know we reviewed with the patient the storage injection technique and the important safety information about the Enbrel.  If you have any questions, please call back at [phone]

## 2020-12-22 ENCOUNTER — Telehealth: Payer: Self-pay

## 2020-12-22 NOTE — Telephone Encounter (Signed)
Patient called stating she alternates taking her Enbrel in her thigh and stomach.  Patient states she doesn't have any difficulty with her leg, but she does with her stomach.  Patient states she gives herself the injection the same way, but the medication leaks out and she is not sure how much she is actually getting.  Patient requested a return call to let her know if she can just take it in her leg and if she needed to order more injections.

## 2020-12-22 NOTE — Telephone Encounter (Signed)
Patient left a voicemail requesting a return call from Dr. Benjamine Mola or [facility].

## 2020-12-22 NOTE — Telephone Encounter (Signed)
Returned call to patient but unable to reach. Phone went straight to voicemail. Left VM - advised her to administer injection in thigh if that is comfortable and to rotate injection sites weekly. Requested return call if she had further questions and advised her to call rheum clinic in the morning so that she may reach me. Will f/u tomorrow morning  Knox Saliva, PharmD, MPH Clinical Pharmacist (Rheumatology and Pulmonology)

## 2020-12-22 NOTE — Telephone Encounter (Signed)
F/u in next encounter regarding injection site

## 2020-12-23 NOTE — Telephone Encounter (Signed)
Patient returned call this morning regarding Enbrel. States she tried to administer Enbrel in her stomach for two weeks. The first week she did not pinch skin where she was administering. The second week she did pinch the skin but medication still leaked. She states the medication does not leak when using thigh. I've advised her to administer in thigh and rotate left vs right thigh every week. She asked if she needed a replacement, but since she did not double up on doses and is still on schedule, she will not run out of medication sooner than is due  She verbalized understanding. Nothing further needed.  Knox Saliva, PharmD, MPH Clinical Pharmacist (Rheumatology and Pulmonology)

## 2021-01-01 ENCOUNTER — Ambulatory Visit (INDEPENDENT_AMBULATORY_CARE_PROVIDER_SITE_OTHER): Payer: BC Managed Care – PPO | Admitting: Family Medicine

## 2021-01-01 ENCOUNTER — Other Ambulatory Visit: Payer: Self-pay

## 2021-01-01 ENCOUNTER — Encounter: Payer: Self-pay | Admitting: Family Medicine

## 2021-01-01 VITALS — BP 128/80 | HR 81 | Temp 97.9°F | Resp 16 | Ht 66.0 in | Wt 222.8 lb

## 2021-01-01 DIAGNOSIS — T783XXA Angioneurotic edema, initial encounter: Secondary | ICD-10-CM | POA: Diagnosis not present

## 2021-01-01 DIAGNOSIS — E559 Vitamin D deficiency, unspecified: Secondary | ICD-10-CM

## 2021-01-01 DIAGNOSIS — M05742 Rheumatoid arthritis with rheumatoid factor of left hand without organ or systems involvement: Secondary | ICD-10-CM

## 2021-01-01 DIAGNOSIS — E8881 Metabolic syndrome: Secondary | ICD-10-CM | POA: Diagnosis not present

## 2021-01-01 DIAGNOSIS — N898 Other specified noninflammatory disorders of vagina: Secondary | ICD-10-CM

## 2021-01-01 DIAGNOSIS — M05741 Rheumatoid arthritis with rheumatoid factor of right hand without organ or systems involvement: Secondary | ICD-10-CM

## 2021-01-01 DIAGNOSIS — I10 Essential (primary) hypertension: Secondary | ICD-10-CM | POA: Diagnosis not present

## 2021-01-01 DIAGNOSIS — R32 Unspecified urinary incontinence: Secondary | ICD-10-CM

## 2021-01-01 DIAGNOSIS — M62830 Muscle spasm of back: Secondary | ICD-10-CM

## 2021-01-01 MED ORDER — HYDROXYZINE PAMOATE 25 MG PO CAPS
25.0000 mg | ORAL_CAPSULE | Freq: Three times a day (TID) | ORAL | 0 refills | Status: DC | PRN
Start: 2021-01-01 — End: 2021-08-14

## 2021-01-01 MED ORDER — PREDNISONE 5 MG PO TABS: 5.0000 mg | ORAL_TABLET | Freq: Two times a day (BID) | ORAL | 0 refills | Status: AC

## 2021-01-01 NOTE — Patient Instructions (Addendum)
F/U in mid November , call if you need me before Please stop peanuts  till you are evaluated further  Prednisone and hydroxyzine are prescribed for allergic reaction, cause unknown  You are referred to Allergist  Take the prednisone for at least 3 days, 5 days are prescribed, hydroxyzine may make you sleepy, I recommend 1  daily at bedtime   for  3 days then only if needed  Fasting lipid, TSH, vit D and HBA1C next week  Please schedule mammogram at checkout  Thanks for choosing [facility], we consider it a privelige to serve you.

## 2021-01-01 NOTE — Progress Notes (Signed)
Julie Ryan     MRN: [id number]      DOB: December 20, 1965   HPI Julie Ryan is here with swelling of right upper lip x 1 day, no new meds except enbrel in past 1 month, ate peanuts yesterday, not on ACE inhibitors, no difficulty with breathing and swallowing. Gradulally improving, took benadryl ROS Denies recent fever or chills. Denies sinus pressure, nasal congestion, ear pain or sore throat. Denies chest congestion, productive cough or wheezing. Denies chest pains, palpitations and leg swelling Denies abdominal pain, nausea, vomiting,diarrhea or constipation.   Denies dysuria, frequency, hesitancy or incontinence. Chronic  joint pain, swelling and limitation in mobility. Denies headaches, seizures, numbness, or tingling. Denies depression, anxiety or insomnia. Denies skin break down or rash.   PE  BP 128/80   Pulse 81   Temp 97.9 F (36.6 C) (Temporal)   Resp 16   Ht 5\' 6"  (1.676 m)   Wt 222 lb 12.8 oz (101.1 kg)   SpO2 97%   BMI 35.96 kg/m   Patient alert and oriented and in no cardiopulmonary distress.  HEENT: No facial asymmetry, EOMI,     Neck supple .Right upper lip swollen, no airway obstruction or difficulty breathing  Chest: Clear to auscultation bilaterally.  CVS: S1, S2 no murmurs, no S3.Regular rate.  ABD: Soft non tender.   Ext: No edema  MS: decreased  ROM spine, shoulders, hips and knees.  Skin: Intact, no ulcerations or rash noted.  Psych: Good eye contact, normal affect. Memory intact not anxious or depressed appearing.  CNS: CN 2-12 intact, power,  normal throughout.no focal deficits noted.   Assessment & Plan  Angioedema Acute lip swelling unknown cause, probable nut allergy , new, refer allergist, short course of prednisone and hydroxyzine  Essential hypertension Controlled, no change in medication DASH diet and commitment to daily physical activity for a minimum of 30 minutes discussed and encouraged, as a part of hypertension  management. The importance of attaining a healthy weight is also discussed.  BP/Weight 01/01/2021 11/26/2020 11/11/2020 10/14/2020 09/16/2020 08/28/2020 2/29/7989  Systolic BP 211 941 740 814 481 856 314  Diastolic BP 80 74 72 84 79 69 82  Wt. (Lbs) 222.8 - 224.6 218 222.1 217.2 214  BMI 35.96 - 36.25 35.19 35.85 34.79 34.54  Some encounter information is confidential and restricted. Go to Review Flowsheets activity to see all data.       MORBID OBESITY  Patient re-educated about  the importance of commitment to a  minimum of 150 minutes of exercise per week as able.  The importance of healthy food choices with portion control discussed, as well as eating regularly and within a 12 hour window most days. The need to choose "clean , green" food 50 to 75% of the time is discussed, as well as to make water the primary drink and set a goal of 64 ounces water daily.    Weight /BMI 01/01/2021 11/11/2020 10/14/2020  WEIGHT 222 lb 12.8 oz 224 lb 9.6 oz 218 lb  HEIGHT 5\' 6"  5\' 6"  5\' 6"   BMI 35.96 kg/m2 36.25 kg/m2 35.19 kg/m2  Some encounter information is confidential and restricted. Go to Review Flowsheets activity to see all data.      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 factor ([place]) Managed by rheumatology and on Enbrel x 1 month  Back spasm On chronic muscle relaxant  Urinary incontinence Controlled, no change in medication   Vaginal dryness Controlled, no change in medication

## 2021-01-04 ENCOUNTER — Encounter: Payer: Self-pay | Admitting: Family Medicine

## 2021-01-04 NOTE — Assessment & Plan Note (Signed)
On chronic muscle relaxant

## 2021-01-04 NOTE — Assessment & Plan Note (Signed)
Managed by rheumatology and on Enbrel x 1 month

## 2021-01-04 NOTE — Assessment & Plan Note (Signed)
Controlled, no change in medication  

## 2021-01-04 NOTE — Assessment & Plan Note (Signed)
Acute lip swelling unknown cause, probable nut allergy , new, refer allergist, short course of prednisone and hydroxyzine

## 2021-01-04 NOTE — Assessment & Plan Note (Signed)
Controlled, no change in medication DASH diet and commitment to daily physical activity for a minimum of 30 minutes discussed and encouraged, as a part of hypertension management. The importance of attaining a healthy weight is also discussed.  BP/Weight 01/01/2021 11/26/2020 11/11/2020 10/14/2020 09/16/2020 08/28/2020 4/00/8676  Systolic BP 195 093 267 124 580 998 338  Diastolic BP 80 74 72 84 79 69 82  Wt. (Lbs) 222.8 - 224.6 218 222.1 217.2 214  BMI 35.96 - 36.25 35.19 35.85 34.79 34.54  Some encounter information is confidential and restricted. Go to Review Flowsheets activity to see all data.

## 2021-01-04 NOTE — Assessment & Plan Note (Signed)
  Patient re-educated about  the importance of commitment to a  minimum of 150 minutes of exercise per week as able.  The importance of healthy food choices with portion control discussed, as well as eating regularly and within a 12 hour window most days. The need to choose "clean , green" food 50 to 75% of the time is discussed, as well as to make water the primary drink and set a goal of 64 ounces water daily.    Weight /BMI 01/01/2021 11/11/2020 10/14/2020  WEIGHT 222 lb 12.8 oz 224 lb 9.6 oz 218 lb  HEIGHT 5\' 6"  5\' 6"  5\' 6"   BMI 35.96 kg/m2 36.25 kg/m2 35.19 kg/m2  Some encounter information is confidential and restricted. Go to Review Flowsheets activity to see all data.

## 2021-01-07 ENCOUNTER — Encounter: Payer: Self-pay | Admitting: Internal Medicine

## 2021-01-07 ENCOUNTER — Other Ambulatory Visit: Payer: Self-pay

## 2021-01-07 ENCOUNTER — Ambulatory Visit: Payer: BC Managed Care – PPO | Admitting: Internal Medicine

## 2021-01-07 VITALS — BP 117/75 | HR 73 | Ht 66.0 in | Wt 224.2 lb

## 2021-01-07 DIAGNOSIS — M05742 Rheumatoid arthritis with rheumatoid factor of left hand without organ or systems involvement: Secondary | ICD-10-CM

## 2021-01-07 DIAGNOSIS — M05741 Rheumatoid arthritis with rheumatoid factor of right hand without organ or systems involvement: Secondary | ICD-10-CM

## 2021-01-07 DIAGNOSIS — Z79899 Other long term (current) drug therapy: Secondary | ICD-10-CM

## 2021-01-07 NOTE — Progress Notes (Signed)
Office Visit Note  Patient: Julie Ryan             Date of Birth: 06/05/1966           MRN: [id number]             PCP: Fayrene Helper, MD Referring: Fayrene Helper, MD Visit Date: 01/07/2021   Subjective:  Follow-up (Patient is on Enbrel every 7 days, first dose was 11/26/2020. Patient was injecting the medication in the stomach but had issues, so now she is injecting medication into thighs. Patient feels as if symptoms are well controlled on Enbrel. )   History of Present Illness: Julie Ryan is a 55 y.o. female here for follow up for seropositive rheumatoid arthritis on Enbrel 50 mg subcu weekly. She started Enbrel at 5/25 visit. She was originally injecting into abdominal wall but noticed medicine was not going in and some running off skin. She changed to injecting into the thighs and this problem resolved. Overall feels symptoms are improved without much pain and swelling now. She still has morning stiffness.   Previous HPI: Julie Ryan is a 55 y.o. female here for follow up for seropositive rheumatoid arthritis.  She previously started subcutaneous methotrexate 15 mg weekly and had described a improvement in her joint pain swelling and stiffness especially in the bilateral hands but this medicine was stopped due to a considerable rise in transaminase levels on repeat labs.  She has been off any medicine for over 2 weeks now and she does feel like her joint pain and stiffness is getting worse again.  Review of Systems  Constitutional:  Positive for fatigue.  HENT:  Negative for mouth sores, mouth dryness and nose dryness.   Eyes:  Negative for pain, itching, visual disturbance and dryness.  Respiratory:  Negative for cough, hemoptysis, shortness of breath and difficulty breathing.   Cardiovascular:  Negative for chest pain, palpitations and swelling in legs/feet.  Gastrointestinal:  Negative for abdominal pain, blood in stool, constipation and diarrhea.  Endocrine:  Negative for increased urination.  Genitourinary:  Negative for painful urination.  Musculoskeletal:  Positive for morning stiffness. Negative for joint pain, joint pain, joint swelling, myalgias, muscle weakness, muscle tenderness and myalgias.  Skin:  Negative for color change, rash and redness.  Allergic/Immunologic: Negative for susceptible to infections.  Neurological:  Negative for dizziness, numbness, headaches, memory loss and weakness.  Hematological:  Negative for swollen glands.  Psychiatric/Behavioral:  Negative for confusion and sleep disturbance.    PMFS History:  Patient Active Problem List   Diagnosis Date Noted   Angioedema 01/01/2021   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 factor ([place]) 08/28/2020   High risk medication use 08/28/2020   Neck pain 08/19/2020   Dermatomycosis 07/01/2020   Knee instability, left 09/06/2019   Hiatal hernia 07/17/2019   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   Back spasm 04/12/2018   Endometrial polyp 08/09/2017   Fibroids 08/09/2017   Vaginal dryness 07/18/2017   Leg edema 44/07/270   Metabolic syndrome X 53/66/4403   Erosive esophagitis 06/25/2013   GERD (gastroesophageal reflux disease) 03/22/2013   Left knee pain 03/22/2013   Snoring 08/05/2011   Urinary incontinence 08/05/2011   Vitamin D deficiency 03/21/2011   Allergic rhinitis 09/20/2010   Back pain 03/17/2010   MORBID OBESITY 02/11/2010   Essential hypertension 02/11/2010    Past Medical History:  Diagnosis Date   Anxiety    Arthritis    Phreesia  08/19/2020   Carpal tunnel syndrome of right wrist    Chronic knee pain    Depression    Diabetes mellitus without complication [facility])    Endometrial polyp 08/09/2017   will get appt with Dr Elonda Husky   Fibroids 08/09/2017   Has multiple small fibroids    GERD (gastroesophageal reflux disease)    Hypertension    Neuropathy    Obesity    Prediabetes    Seizures ([place])    1 seizure as a  chold; unknown etiology and has never been on meds   Sleep apnea    did not go back to get results   Snoring    normal/ unremakable sleep study per neurology in 12/2016    Family History  Problem Relation Age of Onset   Hypertension Mother    Heart disease Mother    Diabetes Mother    Diabetes Sister    Multiple sclerosis Sister    Lupus Sister    Heart disease Sister    CVA Sister    Depression Sister    Diabetes Brother    Hypertension Brother    Other Son        brain tumor   Hypertension Daughter    Diabetes Sister    Past Surgical History:  Procedure Laterality Date   APPENDECTOMY     CARPAL TUNNEL RELEASE Right 08/01/2015   Procedure: RIGHT CARPAL TUNNEL RELEASE;  Surgeon: Carole Civil, MD;  Location: [facility];  Service: Orthopedics;  Laterality: Right;   CERVICAL POLYPECTOMY  09/14/2017   Procedure: ENDOMETRIAL POLYPECTOMY;  Surgeon: Florian Buff, MD;  Location: [facility];  Service: Gynecology;;   COLONOSCOPY N/A 06/15/2017   Procedure: COLONOSCOPY;  Surgeon: Rogene Houston, MD;  Location: [facility];  Service: Endoscopy;  Laterality: N/A;  830   ENDOMETRIAL ABLATION N/A 09/14/2017   Procedure: ENDOMETRIAL ABLATION( [place]);  Surgeon: Florian Buff, MD;  Location: [facility];  Service: Gynecology;  Laterality: N/A;   ESOPHAGOGASTRODUODENOSCOPY N/A 05/25/2013   Procedure: ESOPHAGOGASTRODUODENOSCOPY (EGD);  Surgeon: Rogene Houston, MD;  Location: [facility];  Service: Endoscopy;  Laterality: N/A;  220   GASTRIC BYPASS     HYSTEROSCOPY WITH D & C N/A 09/14/2017   Procedure: DILATATION AND CURETTAGE /HYSTEROSCOPY;  Surgeon: Florian Buff, MD;  Location: [facility];  Service: Gynecology;  Laterality: N/A;   TUBAL LIGATION     Social History   Social History Narrative   Not on file   Immunization History  Administered Date(s) Administered   Influenza Whole 03/18/2011   Influenza,inj,Quad PF,6+ Mos 03/22/2013, 07/31/2015, 06/01/2016, 04/12/2018, 05/07/2019,  06/26/2020   Influenza-Unspecified 02/26/2017   Moderna Sars-Covid-2 Vaccination 12/05/2019, 01/02/2020, 08/01/2020   PPD Test 07/04/2013, 07/26/2013, 03/15/2016, 03/02/2017, 03/21/2018, 12/17/2019   Td 06/24/2010   Tdap 08/20/2020   Zoster Recombinat (Shingrix) 02/07/2020, 05/01/2020     Objective: Vital Signs: BP 117/75 (BP Location: Left Arm, Patient Position: Sitting, Cuff Size: Large)   Pulse 73   Ht 5\' 6"  (1.676 m)   Wt 224 lb 3.2 oz (101.7 kg)   BMI 36.19 kg/m    Physical Exam Constitutional:      Appearance: She is obese.  Skin:    General: Skin is warm and dry.     Findings: No rash.  Neurological:     Mental Status: She is alert.  Psychiatric:        Mood and Affect: Mood normal.     Musculoskeletal Exam:  Shoulders full  ROM no tenderness or swelling Elbows full ROM no tenderness or swelling Wrists full ROM no tenderness or swelling Right 1st MCP and left 3rd PIP synovitis and tenderness other digits nontender and full ROM Knees bilateral patellofemoral crepitus with full ROM no swelling Ankles full ROM no tenderness or swelling   CDAI Exam: CDAI Score: 7  Patient Global: 20 mm; Provider Global: 10 mm Swollen: 2 ; Tender: 2  Joint Exam 01/07/2021      Right  Left  MCP 1  Swollen Tender     PIP 3     Swollen Tender     Investigation: No additional findings.  Imaging: No results found.  Recent Labs: Lab Results  Component Value Date   WBC 9.0 01/07/2021   HGB 12.6 01/07/2021   PLT 292 01/07/2021   NA 138 01/07/2021   K 4.5 01/07/2021   CL 103 01/07/2021   CO2 25 01/07/2021   GLUCOSE 102 (H) 01/07/2021   BUN 19 01/07/2021   CREATININE 0.96 01/07/2021   BILITOT 0.6 01/07/2021   ALKPHOS 79 07/27/2019   AST 17 01/07/2021   ALT 19 01/07/2021   PROT 7.1 01/07/2021   ALBUMIN 4.1 07/27/2019   CALCIUM 9.3 01/07/2021   GFRAA 76 01/07/2021   QFTBGOLDPLUS NEGATIVE 09/02/2020    Speciality Comments: No specialty comments  available.  Procedures:  No procedures performed Allergies: Codeine, Effexor xr [venlafaxine hcl er], Fluoxetine, Hydrocodone, Methotrexate derivatives, and Latex   Assessment / Plan:     Visit Diagnoses: 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 factor ([place]) - Plan: Sedimentation rate  Arthritis symptoms appear fairly well controlled though there is still activity present at a low rate on exam today and with the morning stiffness reported.  Plan to continue Enbrel 50 mg weekly.  We will check sedimentation rate today.  Assuming normal labs we will plan to follow-up in 3 months.  High risk medication use - Plan: CBC with Differential/Platelet, COMPLETE METABOLIC PANEL WITH GFR  TNF inhibitor treatment can increase risks for cytopenias or liver function changes checking CBC and CMP today after new drug start.  Orders: Orders Placed This Encounter  Procedures   Sedimentation rate   CBC with Differential/Platelet   COMPLETE METABOLIC PANEL WITH GFR    No orders of the defined types were placed in this encounter.    Follow-Up Instructions: No follow-ups on file.   Collier Salina, MD  Note - This record has been created using [employer].  Chart creation errors have been sought, but may not always  have been located. Such creation errors do not reflect on  the standard of medical care.

## 2021-01-08 LAB — CBC WITH DIFFERENTIAL/PLATELET
Absolute Monocytes: 576 cells/uL (ref 200–950)
Basophils Absolute: 27 cells/uL (ref 0–200)
Basophils Relative: 0.3 %
Eosinophils Absolute: 279 cells/uL (ref 15–500)
Eosinophils Relative: 3.1 %
HCT: 39 % (ref 35.0–45.0)
Hemoglobin: 12.6 g/dL (ref 11.7–15.5)
Lymphs Abs: 3240 cells/uL (ref 850–3900)
MCH: 31.1 pg (ref 27.0–33.0)
MCHC: 32.3 g/dL (ref 32.0–36.0)
MCV: 96.3 fL (ref 80.0–100.0)
MPV: 9.6 fL (ref 7.5–12.5)
Monocytes Relative: 6.4 %
Neutro Abs: 4878 cells/uL (ref 1500–7800)
Neutrophils Relative %: 54.2 %
Platelets: 292 10*3/uL (ref 140–400)
RBC: 4.05 10*6/uL (ref 3.80–5.10)
RDW: 12.8 % (ref 11.0–15.0)
Total Lymphocyte: 36 %
WBC: 9 10*3/uL (ref 3.8–10.8)

## 2021-01-08 LAB — COMPLETE METABOLIC PANEL WITH GFR
AG Ratio: 1.3 (calc) (ref 1.0–2.5)
ALT: 19 U/L (ref 6–29)
AST: 17 U/L (ref 10–35)
Albumin: 4 g/dL (ref 3.6–5.1)
Alkaline phosphatase (APISO): 105 U/L (ref 37–153)
BUN: 19 mg/dL (ref 7–25)
CO2: 25 mmol/L (ref 20–32)
Calcium: 9.3 mg/dL (ref 8.6–10.4)
Chloride: 103 mmol/L (ref 98–110)
Creat: 0.96 mg/dL (ref 0.50–1.05)
GFR, Est African American: 76 mL/min/{1.73_m2} (ref >=60)
GFR, Est Non African American: 66 mL/min/{1.73_m2} (ref >=60)
Globulin: 3.1 g/dL (calc) (ref 1.9–3.7)
Glucose, Bld: 102 mg/dL — ABNORMAL HIGH (ref 65–99)
Potassium: 4.5 mmol/L (ref 3.5–5.3)
Sodium: 138 mmol/L (ref 135–146)
Total Bilirubin: 0.6 mg/dL (ref 0.2–1.2)
Total Protein: 7.1 g/dL (ref 6.1–8.1)

## 2021-01-08 LAB — SEDIMENTATION RATE: Sed Rate: 33 mm/h — ABNORMAL HIGH (ref 0–30)

## 2021-01-08 NOTE — Progress Notes (Signed)
Blood tests look good to continue the Enbrel medication with no changes.  Her sedimentation rate is partially decreased although not back down to normal yet.  Hopefully should improve further with continued treatment and f/u in 3 months.

## 2021-01-09 ENCOUNTER — Other Ambulatory Visit: Payer: Self-pay | Admitting: Orthopedic Surgery

## 2021-01-09 ENCOUNTER — Other Ambulatory Visit: Payer: Self-pay | Admitting: Family Medicine

## 2021-01-09 ENCOUNTER — Other Ambulatory Visit: Payer: Self-pay

## 2021-01-09 ENCOUNTER — Ambulatory Visit (HOSPITAL_COMMUNITY)
Admission: RE | Admit: 2021-01-09 | Discharge: 2021-01-09 | Disposition: A | Discharge status: Home / Self Care | Payer: BC Managed Care – PPO | Source: Ambulatory Visit | Attending: Family Medicine | Admitting: Family Medicine

## 2021-01-09 DIAGNOSIS — Z1231 Encounter for screening mammogram for malignant neoplasm of breast: Secondary | ICD-10-CM | POA: Insufficient documentation

## 2021-01-09 LAB — LIPID PANEL
Chol/HDL Ratio: 3.5 ratio (ref 0.0–4.4)
Cholesterol, Total: 196 mg/dL (ref 100–199)
HDL: 56 mg/dL (ref >39)
LDL Chol Calc (NIH): 123 mg/dL — ABNORMAL HIGH (ref 0–99)
Triglycerides: 97 mg/dL (ref 0–149)
VLDL Cholesterol Cal: 17 mg/dL (ref 5–40)

## 2021-01-09 LAB — HEMOGLOBIN A1C
Est. average glucose Bld gHb Est-mCnc: 108 mg/dL
Hgb A1c MFr Bld: 5.4 % (ref 4.8–5.6)

## 2021-01-09 LAB — VITAMIN D 25 HYDROXY (VIT D DEFICIENCY, FRACTURES): Vit D, 25-Hydroxy: 29.5 ng/mL — ABNORMAL LOW (ref 30.0–100.0)

## 2021-01-09 LAB — TSH: TSH: 1.21 u[IU]/mL (ref 0.450–4.500)

## 2021-01-09 MED ORDER — ERGOCALCIFEROL 1.25 MG (50000 UT) PO CAPS: 50000.0000 [IU] | ORAL_CAPSULE | ORAL | 1 refills | Status: DC

## 2021-01-09 NOTE — Progress Notes (Unsigned)
Needs weekly vit D, need to rx

## 2021-01-17 ENCOUNTER — Other Ambulatory Visit: Payer: Self-pay | Admitting: Internal Medicine

## 2021-01-17 ENCOUNTER — Other Ambulatory Visit: Payer: Self-pay | Admitting: Family Medicine

## 2021-01-17 DIAGNOSIS — J0110 Acute frontal sinusitis, unspecified: Secondary | ICD-10-CM

## 2021-01-20 ENCOUNTER — Ambulatory Visit: Payer: BC Managed Care – PPO | Admitting: Family Medicine

## 2021-02-02 ENCOUNTER — Other Ambulatory Visit: Payer: Self-pay | Admitting: Internal Medicine

## 2021-02-02 DIAGNOSIS — M05741 Rheumatoid arthritis with rheumatoid factor of right hand without organ or systems involvement: Secondary | ICD-10-CM

## 2021-02-02 DIAGNOSIS — M05742 Rheumatoid arthritis with rheumatoid factor of left hand without organ or systems involvement: Secondary | ICD-10-CM

## 2021-02-02 MED ORDER — ENBREL MINI 50 MG/ML ~~LOC~~ SOCT: 50.0000 mg | SUBCUTANEOUS | 0 refills | Status: DC

## 2021-02-02 NOTE — Telephone Encounter (Signed)
Next Visit: 04/08/2021 Last Visit: 01/07/2021  Last Fill: 11/26/2020  DX: 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 factor  Current Dose per office note 01/07/2021: Enbrel 50 mg weekly  Labs: 01/07/2021- CMP, CBC, Sed Rate TB Gold: 09/02/2020- negative  Okay to refill Enbrel?

## 2021-02-02 NOTE — Telephone Encounter (Signed)
Patient calling to get a refill on Enbrel sent to her mail order pharmacy. Patient has three doses left.

## 2021-02-03 ENCOUNTER — Other Ambulatory Visit: Payer: Self-pay | Admitting: Internal Medicine

## 2021-02-03 DIAGNOSIS — M05741 Rheumatoid arthritis with rheumatoid factor of right hand without organ or systems involvement: Secondary | ICD-10-CM

## 2021-02-12 ENCOUNTER — Other Ambulatory Visit: Payer: Self-pay | Admitting: Family Medicine

## 2021-03-11 ENCOUNTER — Ambulatory Visit: Payer: BC Managed Care – PPO | Admitting: Allergy & Immunology

## 2021-03-11 ENCOUNTER — Encounter: Payer: Self-pay | Admitting: Allergy & Immunology

## 2021-03-11 ENCOUNTER — Other Ambulatory Visit: Payer: Self-pay

## 2021-03-11 VITALS — BP 118/82 | HR 72 | Temp 98.0°F | Resp 16 | Ht 66.0 in | Wt 220.4 lb

## 2021-03-11 DIAGNOSIS — L2089 Other atopic dermatitis: Secondary | ICD-10-CM | POA: Diagnosis not present

## 2021-03-11 DIAGNOSIS — T7800XD Anaphylactic reaction due to unspecified food, subsequent encounter: Secondary | ICD-10-CM | POA: Diagnosis not present

## 2021-03-11 DIAGNOSIS — J301 Allergic rhinitis due to pollen: Secondary | ICD-10-CM

## 2021-03-11 MED ORDER — TRIAMCINOLONE ACETONIDE 0.1 % EX OINT: 1.0000 "application " | TOPICAL_OINTMENT | Freq: Two times a day (BID) | CUTANEOUS | 1 refills | Status: DC | PRN

## 2021-03-11 NOTE — Patient Instructions (Addendum)
1. Anaphylactic shock due to food (peanuts, tree nuts) - Testing was negative to peanuts and tree nuts. - Copy of testing results provided. - You tolerated a peanut challenge in the office. - However, I want to make sure that you are not allergic to tree nuts. - Therefore, we we will schedule a mixed tree nut butter challenge in the office (this will be 1.5-2 hours). - Avoid all peanuts and tree nuts until that time (since some peanuts are processed with tree nuts and I want to make sure that you tolerate tree nuts first).  2. Seasonal allergic rhinitis due to pollen - We did not do environmental testing since her symptoms were not too severe. - We can always do that again in the future if needed.  3. Flexural atopic dermatitis - Continue with your moisturizing aggressively as you are doing. - We can add a topical steroid if you are interested for those flares in your elbows.  4. Return in about 4 weeks (around 04/08/2021) for a MIXED TREE NUT BUTTER CHALLENGE (we have this at our office, so no need to bring anything).   Please inform us of any Emergency Department visits, hospitalizations, or changes in symptoms. Call us before going to the ED for breathing or allergy symptoms since we might be able to fit you in for a sick visit. Feel free to contact us anytime with any questions, problems, or concerns.  It was a pleasure to meet you today!  Websites that have reliable patient information: 1. American Academy of Asthma, Allergy, and Immunology: www.aaaai.org 2. Food Allergy Research and Education (FARE): foodallergy.org 3. Mothers of Asthmatics: http://www.asthmacommunitynetwork.org 4. American College of Allergy, Asthma, and Immunology: www.acaai.org   COVID-19 Vaccine Information can be found at: ShippingScam.co.uk For questions related to vaccine distribution or appointments, please email [email or web address] or call  [phone].   We realize that you might be concerned about having an allergic reaction to the COVID19 vaccines. To help with that concern, WE ARE OFFERING THE COVID19 VACCINES IN OUR OFFICE! Ask the front desk for dates!     "Like" [place] on Facebook and Instagram for our latest updates!      A healthy democracy works best when [employer] participate! Make sure you are registered to vote! If you have moved or changed any of your contact information, you will need to get this updated before voting!  In some cases, you MAY be able to register to vote online: [email or web address]

## 2021-03-11 NOTE — Progress Notes (Signed)
NEW PATIENT  Date of Service/Encounter:  03/11/21  Consult requested by: Fayrene Helper, MD   Assessment:   Food allergy - with negative testing to peanut and tree nut  Seasonal allergic rhinitis - did not do testing since her symptoms were controlled with over-the-counter eyedrops  Eczema in the antecubital fossa  Plan/Recommendations:   1. Anaphylactic shock due to food (peanuts, tree nuts) - Testing was negative to peanuts and tree nuts. - Copy of testing results provided. - You tolerated a peanut challenge in the office. - However, I want to make sure that you are not allergic to tree nuts. - Therefore, we we will schedule a mixed tree nut butter challenge in the office (this will be 1.5-2 hours). - Avoid all peanuts and tree nuts until that time (since some peanuts are processed with tree nuts and I want to make sure that you tolerate tree nuts first).  2. Seasonal allergic rhinitis due to pollen - We did not do environmental testing since her symptoms were not too severe. - We can always do that again in the future if needed.  3. Flexural atopic dermatitis - Continue with your moisturizing aggressively as you are doing. - We can add a topical steroid if you are interested for those flares in your elbows.  4. Return in about 4 weeks (around 04/08/2021) for a MIXED TREE NUT BUTTER CHALLENGE (we have this at our office, so no need to bring anything).    This note in its entirety was forwarded to the Provider who requested this consultation.  Subjective:   Julie Ryan is a 55 y.o. 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   Knee instability, left 09/06/2019   Hiatal hernia 07/17/2019   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  Julie Ryan was referred by Fayrene Helper, MD.     Julie Ryan is a 55 y.o. female presenting for an evaluation of possible food allergies .   Allergic Rhinitis Symptom History: She does have itchy eyes during the summer months. She does not treat it with anything at all. It does not last long and she uses eye drops.   Food Allergy Symptom History: She ate some peanuts with ranch flavoring. She felt her lips do some tingling. She asked her coworker noticed that she had some lip swelling. This happened around one month ago. She has not avoided peanuts and had no problems at all. She  did get some prednisone and hydroxyzine from her PCP to treat this. She decided to try this at home. She has tried peanuts once after the episode without a problem. She ate a "good handful" of peanuts. These were even the same peanuts that were ranch flavored. She likes ranch dressing without a problem. She eats pecans and walnuts. She does eat cashews and pistachios. She has never had problems with nuts at all.  She otherwise tolerates major food allergens without adverse event.  Skin Symptom History: She  does have some eczema in her antecubital fossa.  She moisturizes religiously to try to address this.  She works in [employer] at [employer]. She has done that for 8 years.   Otherwise, there is no history of other atopic diseases, including asthma, drug allergies, stinging insect allergies, urticaria, or contact dermatitis. There is no significant infectious history. Vaccinations are up to date.    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   Back spasm 04/12/2018   Endometrial polyp 08/09/2017   Fibroids 08/09/2017   Vaginal dryness 07/18/2017   Leg edema 28/00/3491   Metabolic syndrome X 79/15/0569   Erosive esophagitis 06/25/2013   GERD (gastroesophageal reflux disease) 03/22/2013   Left knee pain 03/22/2013   Snoring 08/05/2011   Urinary incontinence 08/05/2011   Vitamin D deficiency 03/21/2011   Allergic rhinitis 09/20/2010   Back pain 03/17/2010   MORBID OBESITY 02/11/2010   Essential hypertension 02/11/2010    Medication List:  Allergies as of 03/11/2021       Reactions   Codeine Nausea Only, Other (See Comments)   Dizziness    Effexor Xr [venlafaxine Hcl Er] Other (See Comments)   Makes patient feel on edge    Fluoxetine Other (See Comments)   States that med caused memory problems and crying spells    Hydrocodone Nausea Only, Other (See Comments)   Dizziness    Methotrexate Derivatives    Caused elevation in liver function tests   Latex Itching, Rash        Medication List        Accurate as of March 11, 2021  9:58 AM. If you have any questions, ask your nurse or doctor.           acetaminophen-codeine 300-60 MG tablet Commonly known as: TYLENOL #4   amLODipine 5 MG tablet Commonly known as: NORVASC TAKE 1 TABLET(5 MG) BY MOUTH DAILY   BIOTIN PO Take by mouth daily.   clotrimazole-betamethasone cream Commonly known as: LOTRISONE APPLY TOPICALLY TO THE AFFECTED AREA TWICE DAILY FOR 5 DAYS THEN AS NEEDED   cyclobenzaprine 10 MG tablet Commonly known as: FLEXERIL TAKE 1 TABLET(10 MG) BY MOUTH AT BEDTIME   diclofenac 75 MG EC tablet Commonly known as: VOLTAREN TAKE 1 TABLET(75 MG) BY MOUTH TWICE DAILY WITH A MEAL   Enbrel Mini 50 MG/ML Soct Generic drug: Etanercept Inject 50 mg into the skin every 7 (seven) days.   ergocalciferol 1.25 MG (50000 UT) capsule Commonly known as: VITAMIN D2 Take 1 capsule (50,000 Units total) by mouth once a week. One capsule once weekly   estradiol 0.1 MG/GM vaginal cream Commonly known  as: ESTRACE VAGINAL Use 1 applicator in vagina at bedtime 2-3 x weekly   furosemide 20 MG tablet Commonly known as: LASIX TAKE 1 TABLET BY MOUTH 2 TIMES WEEKLY AS NEEDED FOR LEG AND ANKLE SWELLING   gabapentin 300 MG capsule Commonly known as: NEURONTIN TAKE 1 CAPSULE(300 MG) BY MOUTH AT BEDTIME   hydrOXYzine 25 MG capsule Commonly known as: VISTARIL Take 1 capsule (25 mg total) by mouth every 8 (eight) hours as needed.   IRON PO Take by mouth daily.   metoprolol tartrate 25 MG tablet Commonly known as: LOPRESSOR TAKE 1 TABLET(25 MG) BY MOUTH TWICE DAILY   Narcan 4 MG/0.1ML Liqd nasal spray kit Generic drug: naloxone SMARTSIG:1 Spray(s) Both Nares Once PRN   potassium chloride SA 20 MEQ tablet Commonly known as: KLOR-CON TAKE 1 TABLET BY MOUTH TWO TIMES WEEKLY AS NEEDED, WHEN TAKING FUROSEMIDE FOR LEG SWELLING   PROBIOTIC PO Take by mouth daily.   solifenacin 10 MG tablet Commonly known as: VESICARE Take 1 tablet (10 mg total) by mouth daily.   triamcinolone ointment 0.1 % Commonly known as: KENALOG Apply 1  application topically 2 (two) times daily as needed. Started by: Valentina Shaggy, MD   vitamin B-12 500 MCG tablet Commonly known as: CYANOCOBALAMIN Take 500 mcg by mouth daily.        Birth History: non-contributory  Developmental History: non-contributory  Past Surgical History: Past Surgical History:  Procedure Laterality Date   APPENDECTOMY     CARPAL TUNNEL RELEASE Right 08/01/2015   Procedure: RIGHT CARPAL TUNNEL RELEASE;  Surgeon: Carole Civil, MD;  Location: [facility];  Service: Orthopedics;  Laterality: Right;   CERVICAL POLYPECTOMY  09/14/2017   Procedure: ENDOMETRIAL POLYPECTOMY;  Surgeon: Florian Buff, MD;  Location: [facility];  Service: Gynecology;;   COLONOSCOPY N/A 06/15/2017   Procedure: COLONOSCOPY;  Surgeon: Rogene Houston, MD;  Location: [facility];  Service: Endoscopy;  Laterality: N/A;  830   ENDOMETRIAL ABLATION N/A 09/14/2017   Procedure: ENDOMETRIAL ABLATION( [place]);  Surgeon: Florian Buff, MD;  Location: [facility];  Service: Gynecology;  Laterality: N/A;   ESOPHAGOGASTRODUODENOSCOPY N/A 05/25/2013   Procedure: ESOPHAGOGASTRODUODENOSCOPY (EGD);  Surgeon: Rogene Houston, MD;  [facility]: [facility];  Service: Endoscopy;  Laterality: N/A;  220   GASTRIC BYPASS     HYSTEROSCOPY WITH D & C N/A 09/14/2017   Procedure: DILATATION AND CURETTAGE /HYSTEROSCOPY;  Surgeon: Florian Buff, MD;  Location: [facility];  Service: Gynecology;  Laterality: N/A;   TUBAL LIGATION       Family History: Family History  Problem Relation Age of Onset   Hypertension Mother    Heart disease Mother    Diabetes Mother    Diabetes Sister    Multiple sclerosis Sister    Lupus Sister    Heart disease Sister    CVA Sister    Depression Sister    Diabetes Brother    Hypertension Brother    Other Son        brain tumor   Hypertension Daughter    Diabetes Sister      Social History: Phebe lives at home in a a townhome of unknown age.  There is electric heating and  central cooling.  There is 1 dog inside of the home.  There are dust mite covers on the bed as well as the pillows.  There is tobacco exposure in the house.  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  She is not exposed to fumes, chemicals, or dust.  She does use a HEPA filter in her home.  She does not live near an interstate or industrial area.   Review of Systems  Constitutional: Negative.  Negative for chills, fever, malaise/fatigue and weight loss.  HENT: Negative.  Negative for congestion, ear discharge and ear pain.   Eyes:  Negative for pain, discharge and redness.  Respiratory:  Negative for cough, sputum production, shortness of breath and wheezing.   Cardiovascular: Negative.  Negative for chest pain and palpitations.  Gastrointestinal:  Negative for abdominal pain, constipation, diarrhea, heartburn, nausea and vomiting.  Skin: Negative.  Negative for itching and rash.  Neurological:  Negative for dizziness and headaches.  Endo/Heme/Allergies:  Positive for environmental allergies. Does not bruise/bleed easily.      Objective:   Blood pressure 118/82, pulse 72, temperature 98 F (36.7 C), resp. rate 16, height 5' 6" (1.676 m), weight 220 lb 6.4 oz (100 kg), SpO2 99 %. Body mass index is 35.57 kg/m.   Physical Exam:   Physical Exam Vitals reviewed.  Constitutional:      Appearance: She is well-developed.     Comments: Talkative and pleasant.  HENT:     Head: Normocephalic and atraumatic.     Right Ear: Tympanic membrane, ear canal and external ear normal. No drainage, swelling or tenderness. Tympanic membrane is not injected, scarred, erythematous, retracted or bulging.     Left Ear: Tympanic membrane, ear canal and external ear normal. No drainage, swelling or tenderness. Tympanic membrane is not injected, scarred, erythematous, retracted or bulging.     Nose: No nasal deformity, septal  deviation, mucosal edema or rhinorrhea.     Right Turbinates: Enlarged and swollen.     Left Turbinates: Enlarged and swollen.     Right Sinus: No maxillary sinus tenderness or frontal sinus tenderness.     Left Sinus: No maxillary sinus tenderness or frontal sinus tenderness.     Mouth/Throat:     Mouth: Mucous membranes are not pale and not dry.     Pharynx: Uvula midline.  Eyes:     General:        Right eye: No discharge.        Left eye: No discharge.     Conjunctiva/sclera: Conjunctivae normal.     Right eye: Right conjunctiva is not injected. No chemosis.    Left eye: Left conjunctiva is not injected. No chemosis.    Pupils: Pupils are equal, round, and reactive to light.  Cardiovascular:     Rate and Rhythm: Normal rate and regular rhythm.     Heart sounds: Normal heart sounds.  Pulmonary:     Effort: Pulmonary effort is normal. No tachypnea, accessory muscle usage or respiratory distress.     Breath sounds: Normal breath sounds. No wheezing, rhonchi or rales.     Comments: Moving air well in all lung fields.  No increased work of breathing. Chest:     Chest wall: No tenderness.  Abdominal:     Tenderness: There is no abdominal tenderness. There is no guarding or rebound.  Lymphadenopathy:     Head:     Right side of head: No submandibular, tonsillar or occipital adenopathy.     Left side of head: No submandibular, tonsillar or occipital adenopathy.     Cervical: No cervical adenopathy.  Skin:    General: Skin is warm.  Capillary Refill: Capillary refill takes less than 2 seconds.     Coloration: Skin is not pale.     Findings: No abrasion, erythema, petechiae or rash. Rash is not papular, urticarial or vesicular.     Comments: No eczematous or urticarial lesions noted.  Neurological:     Mental Status: She is alert.  Psychiatric:        Behavior: Behavior is cooperative.     Diagnostic studies:   Allergy Studies:    Food Adult Perc - 03/11/21 0800      Time Antigen Placed 2979    Allergen Manufacturer Lavella Hammock    Location Arm     Control-buffer 50% Glycerol Negative    Control-Histamine 1 mg/ml 2+   STRONG HISTAMINE   1. Peanut Negative    10. Cashew Negative    11. Pecan Food Negative    12. [place] Negative    13. Almond Negative    14. Hazelnut Negative    15. [place] nut Negative    16. Coconut Negative    17. Pistachio Negative             Allergy testing results were read and interpreted by myself, documented by clinical staff.         Salvatore Marvel, MD [facility]

## 2021-03-20 ENCOUNTER — Encounter: Payer: Self-pay | Admitting: Family Medicine

## 2021-04-08 ENCOUNTER — Ambulatory Visit: Payer: BC Managed Care – PPO | Admitting: Internal Medicine

## 2021-04-13 ENCOUNTER — Other Ambulatory Visit: Payer: Self-pay | Admitting: Orthopedic Surgery

## 2021-04-16 ENCOUNTER — Telehealth: Payer: Self-pay

## 2021-04-16 ENCOUNTER — Other Ambulatory Visit: Payer: Self-pay | Admitting: Internal Medicine

## 2021-04-16 DIAGNOSIS — M05742 Rheumatoid arthritis with rheumatoid factor of left hand without organ or systems involvement: Secondary | ICD-10-CM

## 2021-04-16 DIAGNOSIS — M05741 Rheumatoid arthritis with rheumatoid factor of right hand without organ or systems involvement: Secondary | ICD-10-CM

## 2021-04-16 NOTE — Telephone Encounter (Signed)
See Rx request for details.

## 2021-04-16 NOTE — Telephone Encounter (Signed)
Next Visit: 05/01/2021  Last Visit: 01/07/2021  Last Fill: 02/02/2021  [id number] arthritis involving both hands with positive rheumatoid factor  Current Dose per office note 01/07/2021:  Enbrel 50 mg weekly  Labs: 01/07/2021- CMP, CBC, Sed Rate, Blood tests look good to continue the Enbrel medication with no changes  TB Gold: 09/02/2020- negative   Okay to refill Enbrel?

## 2021-04-16 NOTE — Telephone Encounter (Signed)
Patient called requesting prescription refill of Enbrel to be sent to the specialty pharmacy.  Patient states she has 4 cartridges remaining.

## 2021-04-30 NOTE — Progress Notes (Deleted)
Office Visit Note  Patient: Julie Ryan             Date of Birth: 09-30-1965           MRN: [id number]             PCP: Fayrene Helper, MD Referring: Fayrene Helper, MD Visit Date: 05/01/2021   Subjective:  No chief complaint on file.   History of Present Illness: Julie Ryan is a 55 y.o. female here for follow up of seropositive RA on Enbrel 50 mg [place] weekly.***   Previous HPI 01/07/21 Julie Ryan is a 55 y.o. female here for follow up for seropositive rheumatoid arthritis on Enbrel 50 mg subcu weekly. She started Enbrel at 5/25 visit. She was originally injecting into abdominal wall but noticed medicine was not going in and some running off skin. She changed to injecting into the thighs and this problem resolved. Overall feels symptoms are improved without much pain and swelling now. She still has morning stiffness.   08/28/20 Julie Ryan is a 55 y.o. female with history of T2DM, R-n-Y bypass surgery, GERD and erosive esophagitis here for evaluation of positive rheumatoid factor and deformity of fingers.  She has longstanding history of bilateral knee pain with multiple arthroscopic procedures this is partially improved after bariatric surgery with some weight loss but remains limiting for her activity level.  More recently she is having more trouble with neck pain and stiffness as well as bilateral hand pain stiffness and swelling.  This is ongoing since at least a few months ago.  Her symptoms are worst in the morning with stiffness lasting at least several hours and improves with activity during the day. Laboratory evaluation in November showed highly positive rheumatoid factor. She has had partial benefit with analgesics but not on any specific treatment for this currently.  She had MRI of the cervical spine earlier this month that showed multilevel disc degeneration.  She has had an injection for right hand fourth digit trigger finger but no other joint surgery or  procedures outside of her knees.  She has a family history of lupus but no rheumatoid arthritis.     Labs reviewed 05/2020 RF 153 ESR 23   No Rheumatology ROS completed.   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   Back spasm 04/12/2018   Endometrial polyp 08/09/2017   Fibroids 08/09/2017   Vaginal dryness 07/18/2017   Leg edema 40/98/1191   Metabolic syndrome X 47/82/9562   Erosive esophagitis 06/25/2013   GERD (gastroesophageal reflux disease) 03/22/2013   Left knee pain 03/22/2013   Snoring 08/05/2011   Urinary incontinence 08/05/2011   Vitamin D deficiency 03/21/2011   Allergic rhinitis 09/20/2010   Back pain 03/17/2010   MORBID OBESITY 02/11/2010   Essential hypertension 02/11/2010    Past Medical History:  Diagnosis Date   Angio-edema    Anxiety    Arthritis    Phreesia 08/19/2020   Carpal tunnel syndrome of right wrist    Chronic knee pain    Depression    Diabetes mellitus without complication [facility])    Endometrial polyp 08/09/2017   will  get appt with Dr Elonda Husky   Fibroids 08/09/2017   Has multiple small fibroids    GERD (gastroesophageal reflux disease)    Hypertension    Neuropathy    Obesity    Prediabetes    Seizures ([place])    1 seizure as a chold; unknown etiology and has never been on meds   Sleep apnea    did not go back to get results   Snoring    normal/ unremakable sleep study per neurology in 12/2016    Family History  Problem Relation Age of Onset   Hypertension Mother    Heart disease Mother    Diabetes Mother    Diabetes Sister    Multiple sclerosis Sister    Lupus Sister    Heart disease  Sister    CVA Sister    Depression Sister    Diabetes Brother    Hypertension Brother    Other Son        brain tumor   Hypertension Daughter    Diabetes Sister    Past Surgical History:  Procedure Laterality Date   APPENDECTOMY     CARPAL TUNNEL RELEASE Right 08/01/2015   Procedure: RIGHT CARPAL TUNNEL RELEASE;  Surgeon: Carole Civil, MD;  Location: [facility];  Service: Orthopedics;  Laterality: Right;   CERVICAL POLYPECTOMY  09/14/2017   Procedure: ENDOMETRIAL POLYPECTOMY;  Surgeon: Florian Buff, MD;  Location: [facility];  Service: Gynecology;;   COLONOSCOPY N/A 06/15/2017   Procedure: COLONOSCOPY;  Surgeon: Rogene Houston, MD;  Location: [facility];  Service: Endoscopy;  Laterality: N/A;  830   ENDOMETRIAL ABLATION N/A 09/14/2017   Procedure: ENDOMETRIAL ABLATION( [place]);  Surgeon: Florian Buff, MD;  Location: [facility];  Service: Gynecology;  Laterality: N/A;   ESOPHAGOGASTRODUODENOSCOPY N/A 05/25/2013   Procedure: ESOPHAGOGASTRODUODENOSCOPY (EGD);  Surgeon: Rogene Houston, MD;  Location: [facility];  Service: Endoscopy;  Laterality: N/A;  220   GASTRIC BYPASS     HYSTEROSCOPY WITH D & C N/A 09/14/2017   Procedure: DILATATION AND CURETTAGE /HYSTEROSCOPY;  Surgeon: Florian Buff, MD;  Location: [facility];  Service: Gynecology;  Laterality: N/A;   TUBAL LIGATION     Social History   Social History Narrative   Not on file   Immunization History  Administered Date(s) Administered   Influenza Whole 03/18/2011   Influenza,inj,Quad PF,6+ Mos 03/22/2013, 07/31/2015, 06/01/2016, 04/12/2018, 05/07/2019, 06/26/2020   Influenza-Unspecified 02/26/2017   Moderna Sars-Covid-2 Vaccination 12/05/2019, 01/02/2020, 08/01/2020   PPD Test 07/04/2013, 07/26/2013, 03/15/2016, 03/02/2017, 03/21/2018, 12/17/2019   Td 06/24/2010   Tdap 08/20/2020   Zoster Recombinat (Shingrix) 02/07/2020, 05/01/2020     Objective: Vital Signs: There were no vitals taken for this visit.   Physical Exam    Musculoskeletal Exam: ***  CDAI Exam: CDAI Score: -- Patient Global: --; Provider Global: -- Swollen: --; Tender: -- Joint Exam 05/01/2021   No joint exam has been documented for this visit   There is currently no information documented on the homunculus. Go to the Rheumatology activity and complete the homunculus joint exam.  Investigation: No additional findings.  Imaging: No results found.  Recent Labs: Lab Results  Component Value Date   WBC 9.0 01/07/2021   HGB 12.6 01/07/2021   PLT 292 01/07/2021   NA 138 01/07/2021   K 4.5 01/07/2021   CL 103 01/07/2021   CO2 25 01/07/2021   GLUCOSE 102 (H) 01/07/2021   BUN 19 01/07/2021   CREATININE 0.96 01/07/2021  BILITOT 0.6 01/07/2021   ALKPHOS 79 07/27/2019   AST 17 01/07/2021   ALT 19 01/07/2021   PROT 7.1 01/07/2021   ALBUMIN 4.1 07/27/2019   CALCIUM 9.3 01/07/2021   GFRAA 76 01/07/2021   QFTBGOLDPLUS NEGATIVE 09/02/2020    Speciality Comments: No specialty comments available.  Procedures:  No procedures performed Allergies: Codeine, Effexor xr [venlafaxine hcl er], Fluoxetine, Hydrocodone, Methotrexate derivatives, and Latex   Assessment / Plan:     Visit Diagnoses: No diagnosis found.  ***  Orders: No orders of the defined types were placed in this encounter.  No orders of the defined types were placed in this encounter.    Follow-Up Instructions: No follow-ups on file.   Collier Salina, MD  Note - This record has been created using [employer].  Chart creation errors have been sought, but may not always  have been located. Such creation errors do not reflect on  the standard of medical care.

## 2021-05-01 ENCOUNTER — Ambulatory Visit: Payer: BC Managed Care – PPO | Admitting: Internal Medicine

## 2021-05-04 ENCOUNTER — Encounter: Payer: BC Managed Care – PPO | Admitting: Family Medicine

## 2021-05-04 NOTE — Progress Notes (Deleted)
   [address], [address] [place] [address] Dept: [phone]  FOLLOW UP NOTE  Patient ID: Julie Ryan, female    DOB: [birth year]  Age: 55 y.o. MRN: [id number] Date of Office Visit: 05/04/2021  Assessment  Chief Complaint: No chief complaint on file.  HPI [name]    Drug Allergies:  Allergies  Allergen Reactions   Codeine Nausea Only and Other (See Comments)    Dizziness    Effexor Xr [Venlafaxine Hcl Er] Other (See Comments)    Makes patient feel on edge    Fluoxetine Other (See Comments)    States that med caused memory problems and crying spells    Hydrocodone Nausea Only and Other (See Comments)    Dizziness    Methotrexate Derivatives     Caused elevation in liver function tests   Latex Itching and Rash    Physical Exam: There were no vitals taken for this visit.   Physical Exam  Diagnostics:    Procedure note: Consent obtained {Blank single:19197::"Open graded *** challenge","Open graded *** oral challenge"}: The patient was able to tolerate the challenge today without adverse signs or symptoms. Vital signs were stable throughout the challenge and observation period. She received multiple doses separated by {Blank single:19197::"30 minutes","20 minutes","15 minutes","10 minutes"}, each of which was separated by vitals and a brief physical exam. She received the following doses: lip rub, 1 gm, 2 gm, 4 gm, 8 gm, and 16 gm. She was monitored for 60 minutes following the last dose.   The patient had {Blank single:19197::"negative skin prick test and sIgE tests to ***","negative sIgE tests to ***","negative skin prick tests to ***"} and was able to tolerate the open graded oral challenge today without adverse signs or symptoms. Therefore, she has the same risk of systemic reaction associated with {Blank single:19197::"the consumption of ***"} as the general population.   Assessment and Plan: No diagnosis found.  No orders of the defined types were  placed in this encounter.   There are no Patient Instructions on file for this visit.  No follow-ups on file.    Thank you for the opportunity to care for this patient.  Please do not hesitate to contact me with questions.  Gareth Morgan, FNP [facility]

## 2021-05-04 NOTE — Patient Instructions (Incomplete)
***   was able to tolerate the *** food challenge today at the office without adverse signs or symptoms of an allergic reaction. Therefore, *** has the same risk of systemic reaction associated with the consumption of *** as the general population.  - Do not give any ***  for the next 24 hours. - Monitor for allergic symptoms such as rash, wheezing, diarrhea, swelling, and vomiting for the next 24 hours. If severe symptoms occur, treat with EpiPen injection and call 911. For less severe symptoms treat with Benadryl *** teaspoonfuls every *** hours and call the clinic.  - If no allergic symptoms are evident, reintroduce ***  into the diet, 1-2 servings a day. If *** develops an allergic reaction to *** , record what was eaten the amount eaten, preparation method, time from ingestion to reaction, and symptoms.   Call the clinic if this treatment plan is not working well for you  Follow up in *** or sooner if needed.

## 2021-05-05 ENCOUNTER — Telehealth: Payer: Self-pay | Admitting: Internal Medicine

## 2021-05-05 NOTE — Telephone Encounter (Signed)
It should be okay to draw labs at the same visit, still only about 4 months since last labs in July.

## 2021-05-05 NOTE — Telephone Encounter (Signed)
Patient would like to know if she needs to get labs drawn before her next appointment on 05/27/2021? Please call to advise.

## 2021-05-05 NOTE — Telephone Encounter (Signed)
Advised patient that It should be okay to draw labs at the same visit, still only about 4 months since last labs in July. Patient verbalized understanding.

## 2021-05-13 ENCOUNTER — Ambulatory Visit (INDEPENDENT_AMBULATORY_CARE_PROVIDER_SITE_OTHER): Payer: BC Managed Care – PPO | Admitting: Family Medicine

## 2021-05-13 ENCOUNTER — Other Ambulatory Visit: Payer: Self-pay

## 2021-05-13 ENCOUNTER — Encounter: Payer: Self-pay | Admitting: Family Medicine

## 2021-05-13 VITALS — BP 135/83 | HR 79 | Resp 17 | Ht 66.0 in | Wt 227.0 lb

## 2021-05-13 DIAGNOSIS — F411 Generalized anxiety disorder: Secondary | ICD-10-CM | POA: Diagnosis not present

## 2021-05-13 DIAGNOSIS — I10 Essential (primary) hypertension: Secondary | ICD-10-CM

## 2021-05-13 DIAGNOSIS — Z23 Encounter for immunization: Secondary | ICD-10-CM | POA: Diagnosis not present

## 2021-05-13 MED ORDER — BUSPIRONE HCL 5 MG PO TABS: 5.0000 mg | ORAL_TABLET | Freq: Two times a day (BID) | ORAL | 3 refills | Status: DC

## 2021-05-13 MED ORDER — PHENTERMINE HCL 37.5 MG PO TABS: ORAL_TABLET | ORAL | 3 refills | Status: DC

## 2021-05-13 NOTE — Patient Instructions (Addendum)
F/U in 3 months, call if you need me before   Flu vaccine today  New is half phentermine daily  Please get covid booster                      Follow-up in 8 to 10 weeks call if you need me sooner.  Flu vaccine in office today.  New is phentermine half tablet daily  Please give pt 1200 and 1500 cal diet sheets    New for Anxiety is BuSpar and you are referred to a Psychologist/ Therapist  Please get your COVID booster as soon as possible.

## 2021-05-15 ENCOUNTER — Other Ambulatory Visit: Payer: Self-pay | Admitting: Urology

## 2021-05-15 DIAGNOSIS — R32 Unspecified urinary incontinence: Secondary | ICD-10-CM

## 2021-05-17 ENCOUNTER — Encounter: Payer: Self-pay | Admitting: Family Medicine

## 2021-05-17 NOTE — Assessment & Plan Note (Signed)
Buspar prescribed and referred to therapy, [place] has PHQ 9 score of 8, not suicidal or homicidal, stressed with life, illness in longtime partner

## 2021-05-17 NOTE — Assessment & Plan Note (Signed)
DASH diet and commitment to daily physical activity for a minimum of 30 minutes discussed and encouraged, as a part of hypertension management. The importance of attaining a healthy weight is also discussed. Controlled, no change in medication  BP/Weight 05/13/2021 03/11/2021 01/07/2021 01/01/2021 11/26/2020 11/11/2020 11/22/7469  Systolic BP 595 396 728 979 150 413 643  Diastolic BP 83 82 75 80 74 72 84  Wt. (Lbs) 227.04 220.4 224.2 222.8 - 224.6 218  BMI 36.65 35.57 36.19 35.96 - 36.25 35.19  Some encounter information is confidential and restricted. Go to Review Flowsheets activity to see all data.

## 2021-05-17 NOTE — Assessment & Plan Note (Signed)
Deteriorated , start half phentermine daily  Patient re-educated about  the importance of commitment to a  minimum of 150 minutes of exercise per week as able.  The importance of healthy food choices with portion control discussed, as well as eating regularly and within a 12 hour window most days. The need to choose "clean , green" food 50 to 75% of the time is discussed, as well as to make water the primary drink and set a goal of 64 ounces water daily.    Weight /BMI 05/13/2021 03/11/2021 01/07/2021  WEIGHT 227 lb 0.6 oz 220 lb 6.4 oz 224 lb 3.2 oz  HEIGHT 5\' 6"  5\' 6"  5\' 6"   BMI 36.65 kg/m2 35.57 kg/m2 36.19 kg/m2  Some encounter information is confidential and restricted. Go to Review Flowsheets activity to see all data.

## 2021-05-17 NOTE — Progress Notes (Signed)
   Julie Ryan     MRN: [id number]      DOB: 1965-08-11   HPI [name] is here for follow up and re-evaluation of chronic medical conditions, medication management and review of any available recent lab and radiology data.  Preventive health is updated, specifically  Cancer screening and Immunization.   Questions or concerns regarding consultations or procedures which the PT has had in the interim are  addressed. The PT denies any adverse reactions to current medications since the last visit.  Tc/o weight regain, wants help C/o increased and uncontrolled anxiety   ROS Denies recent fever or chills. Denies sinus pressure, nasal congestion, ear pain or sore throat. Denies chest congestion, productive cough or wheezing. Denies chest pains, palpitations and leg swelling Denies abdominal pain, nausea, vomiting,diarrhea or constipation.   Denies dysuria, frequency, hesitancy or incontinence. Denies joint pain, swelling and limitation in mobility. Denies headaches, seizures, numbness, or tingling. . Denies skin break down or rash.   PE  BP 135/83   Pulse 79   Resp 17   Ht 5\' 6"  (1.676 m)   Wt 227 lb 0.6 oz (103 kg)   SpO2 93%   BMI 36.65 kg/m   Patient alert and oriented and in no cardiopulmonary distress.  HEENT: No facial asymmetry, EOMI,     Neck supple .  Chest: Clear to auscultation bilaterally.  CVS: S1, S2 no murmurs, no S3.Regular rate.  ABD: Soft non tender.   Ext: No edema  MS: Adequate ROM spine, shoulders, hips and knees.  Skin: Intact, no ulcerations or rash noted.  Psych: Good eye contact, normal affect. Memory intact mildly anxious not  depressed appearing.  CNS: CN 2-12 intact, power,  normal throughout.no focal deficits noted.   Assessment & Plan  GAD (generalized anxiety disorder) Buspar prescribed and referred to therapy, [place] has PHQ 9 score of 8, not suicidal or homicidal, stressed with life, illness in longtime partner  Essential  hypertension DASH diet and commitment to daily physical activity for a minimum of 30 minutes discussed and encouraged, as a part of hypertension management. The importance of attaining a healthy weight is also discussed. Controlled, no change in medication  BP/Weight 05/13/2021 03/11/2021 01/07/2021 01/01/2021 11/26/2020 11/11/2020 5/85/2778  Systolic BP 242 353 614 431 540 086 761  Diastolic BP 83 82 75 80 74 72 84  Wt. (Lbs) 227.04 220.4 224.2 222.8 - 224.6 218  BMI 36.65 35.57 36.19 35.96 - 36.25 35.19  Some encounter information is confidential and restricted. Go to Review Flowsheets activity to see all data.       MORBID OBESITY Deteriorated , start half phentermine daily  Patient re-educated about  the importance of commitment to a  minimum of 150 minutes of exercise per week as able.  The importance of healthy food choices with portion control discussed, as well as eating regularly and within a 12 hour window most days. The need to choose "clean , green" food 50 to 75% of the time is discussed, as well as to make water the primary drink and set a goal of 64 ounces water daily.    Weight /BMI 05/13/2021 03/11/2021 01/07/2021  WEIGHT 227 lb 0.6 oz 220 lb 6.4 oz 224 lb 3.2 oz  HEIGHT 5\' 6"  5\' 6"  5\' 6"   BMI 36.65 kg/m2 35.57 kg/m2 36.19 kg/m2  Some encounter information is confidential and restricted. Go to Review Flowsheets activity to see all data.

## 2021-05-18 ENCOUNTER — Ambulatory Visit: Payer: BC Managed Care – PPO | Admitting: Family Medicine

## 2021-05-26 ENCOUNTER — Ambulatory Visit (INDEPENDENT_AMBULATORY_CARE_PROVIDER_SITE_OTHER): Payer: BC Managed Care – PPO | Admitting: Licensed Clinical Social Worker

## 2021-05-26 ENCOUNTER — Other Ambulatory Visit: Payer: Self-pay

## 2021-05-26 DIAGNOSIS — Z91199 Patient's noncompliance with other medical treatment and regimen due to unspecified reason: Secondary | ICD-10-CM

## 2021-05-26 NOTE — Progress Notes (Deleted)
Office Visit Note  Patient: Julie Ryan             Date of Birth: 05/12/1966           MRN: [id number]             PCP: Fayrene Helper, MD Referring: Fayrene Helper, MD Visit Date: 05/27/2021   Subjective:  No chief complaint on file.   History of Present Illness: Julie Ryan is a 55 y.o. female here for follow up for seropositive RA on Enbrel 50 mg [place] weekly. ***   Previous HPI 01/07/21 Julie Ryan is a 55 y.o. female here for follow up for seropositive rheumatoid arthritis on Enbrel 50 mg subcu weekly. She started Enbrel at 5/25 visit. She was originally injecting into abdominal wall but noticed medicine was not going in and some running off skin. She changed to injecting into the thighs and this problem resolved. Overall feels symptoms are improved without much pain and swelling now. She still has morning stiffness.   Previous HPI: Julie Ryan is a 55 y.o. female here for follow up for seropositive rheumatoid arthritis.  She previously started subcutaneous methotrexate 15 mg weekly and had described a improvement in her joint pain swelling and stiffness especially in the bilateral hands but this medicine was stopped due to a considerable rise in transaminase levels on repeat labs.  She has been off any medicine for over 2 weeks now and she does feel like her joint pain and stiffness is getting worse again.   No Rheumatology ROS completed.   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   Back spasm 04/12/2018   Endometrial polyp 08/09/2017   Fibroids 08/09/2017   Vaginal dryness 07/18/2017   Leg edema 03/08/2017   GAD  (generalized anxiety disorder) 78/93/8101   Metabolic syndrome X 75/04/2584   Erosive esophagitis 06/25/2013   GERD (gastroesophageal reflux disease) 03/22/2013   Left knee pain 03/22/2013   Snoring 08/05/2011   Urinary incontinence 08/05/2011   Vitamin D deficiency 03/21/2011   Allergic rhinitis 09/20/2010   Back pain 03/17/2010   MORBID OBESITY 02/11/2010   Essential hypertension 02/11/2010    Past Medical History:  Diagnosis Date   Angio-edema    Anxiety    Arthritis    Phreesia 08/19/2020   Carpal tunnel syndrome of right wrist    Chronic knee pain    Depression    Diabetes mellitus without complication [facility])    Endometrial polyp 08/09/2017   will get appt with Dr Elonda Husky   Fibroids 08/09/2017   Has multiple small fibroids    GERD (gastroesophageal reflux disease)    Hypertension    Neuropathy    Obesity    Prediabetes    Seizures ([place])    1 seizure as a chold; unknown etiology and has never been on meds   Sleep apnea    did not go back to get results   Snoring    normal/ unremakable sleep study per neurology in 12/2016    Family History  Problem Relation Age of Onset   Hypertension Mother    Heart disease Mother  Diabetes Mother    Diabetes Sister    Multiple sclerosis Sister    Lupus Sister    Heart disease Sister    CVA Sister    Depression Sister    Diabetes Brother    Hypertension Brother    Other Son        brain tumor   Hypertension Daughter    Diabetes Sister    Past Surgical History:  Procedure Laterality Date   APPENDECTOMY     CARPAL TUNNEL RELEASE Right 08/01/2015   Procedure: RIGHT CARPAL TUNNEL RELEASE;  Surgeon: Carole Civil, MD;  Location: [facility];  Service: Orthopedics;  Laterality: Right;   CERVICAL POLYPECTOMY  09/14/2017   Procedure: ENDOMETRIAL POLYPECTOMY;  Surgeon: Florian Buff, MD;  Location: [facility];  Service: Gynecology;;   COLONOSCOPY N/A 06/15/2017   Procedure: COLONOSCOPY;  Surgeon: Rogene Houston, MD;  Location:  [facility];  Service: Endoscopy;  Laterality: N/A;  830   ENDOMETRIAL ABLATION N/A 09/14/2017   Procedure: ENDOMETRIAL ABLATION( [place]);  Surgeon: Florian Buff, MD;  Location: [facility];  Service: Gynecology;  Laterality: N/A;   ESOPHAGOGASTRODUODENOSCOPY N/A 05/25/2013   Procedure: ESOPHAGOGASTRODUODENOSCOPY (EGD);  Surgeon: Rogene Houston, MD;  Location: [facility];  Service: Endoscopy;  Laterality: N/A;  220   GASTRIC BYPASS     HYSTEROSCOPY WITH D & C N/A 09/14/2017   Procedure: DILATATION AND CURETTAGE /HYSTEROSCOPY;  Surgeon: Florian Buff, MD;  Location: [facility];  Service: Gynecology;  Laterality: N/A;   TUBAL LIGATION     Social History   Social History Narrative   Not on file   Immunization History  Administered Date(s) Administered   Influenza Whole 03/18/2011   Influenza,inj,Quad PF,6+ Mos 03/22/2013, 07/31/2015, 06/01/2016, 04/12/2018, 05/07/2019, 06/26/2020, 05/13/2021   Influenza-Unspecified 02/26/2017   Moderna Sars-Covid-2 Vaccination 12/05/2019, 01/02/2020, 08/01/2020   PPD Test 07/04/2013, 07/26/2013, 03/15/2016, 03/02/2017, 03/21/2018, 12/17/2019   Td 06/24/2010   Tdap 08/20/2020   Zoster Recombinat (Shingrix) 02/07/2020, 05/01/2020     Objective: Vital Signs: There were no vitals taken for this visit.   Physical Exam   Musculoskeletal Exam: ***  CDAI Exam: CDAI Score: -- Patient Global: --; Provider Global: -- Swollen: --; Tender: -- Joint Exam 05/27/2021   No joint exam has been documented for this visit   There is currently no information documented on the homunculus. Go to the Rheumatology activity and complete the homunculus joint exam.  Investigation: No additional findings.  Imaging: No results found.  Recent Labs: Lab Results  Component Value Date   WBC 9.0 01/07/2021   HGB 12.6 01/07/2021   PLT 292 01/07/2021   NA 138 01/07/2021   K 4.5 01/07/2021   CL 103 01/07/2021   CO2 25 01/07/2021   GLUCOSE 102 (H) 01/07/2021   BUN  19 01/07/2021   CREATININE 0.96 01/07/2021   BILITOT 0.6 01/07/2021   ALKPHOS 79 07/27/2019   AST 17 01/07/2021   ALT 19 01/07/2021   PROT 7.1 01/07/2021   ALBUMIN 4.1 07/27/2019   CALCIUM 9.3 01/07/2021   GFRAA 76 01/07/2021   QFTBGOLDPLUS NEGATIVE 09/02/2020    Speciality Comments: No specialty comments available.  Procedures:  No procedures performed Allergies: Codeine, Effexor xr [venlafaxine hcl er], Fluoxetine, Hydrocodone, Methotrexate derivatives, and Latex   Assessment / Plan:     Visit Diagnoses: No diagnosis found.  ***  Orders: No orders of the defined types were placed in this encounter.  No orders of the defined types were placed  in this encounter.    Follow-Up Instructions: No follow-ups on file.   Collier Salina, MD  Note - This record has been created using [employer].  Chart creation errors have been sought, but may not always  have been located. Such creation errors do not reflect on  the standard of medical care.

## 2021-05-27 ENCOUNTER — Ambulatory Visit: Payer: BC Managed Care – PPO | Admitting: Internal Medicine

## 2021-06-01 NOTE — Progress Notes (Signed)
Office Visit Note  Patient: Julie Ryan             Date of Birth: 10/27/1965           MRN: [id number]             PCP: Fayrene Helper, MD Referring: Fayrene Helper, MD Visit Date: 06/02/2021   Subjective:  Follow-up (Severe pain, bil knees)   History of Present Illness: Julie Ryan is a 55 y.o. female here for follow up for seropositive rheumatoid arthritis on Enbrel 50 mg subcu weekly.  Most of her symptoms are doing well but continues having considerable pain in bilateral knees.  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  Has not been much associated swelling.  Symptoms elsewhere are doing very well.  She has not had any major infection or other side effects.  Previous HPI 01/07/21 [name] is a 55 y.o. female here for follow up for seropositive rheumatoid arthritis on Enbrel 50 mg subcu weekly. She started Enbrel at 5/25 visit. She was originally injecting into abdominal wall but noticed medicine was not going in and some running off skin. She changed to injecting into the thighs and this problem resolved. Overall feels symptoms are improved without much pain and swelling now. She still has morning stiffness.   Previous HPI: Julie Ryan is a 55 y.o. female here for follow up for seropositive rheumatoid arthritis.  She previously started subcutaneous methotrexate 15 mg weekly and had described a improvement in her joint pain swelling and stiffness especially in the bilateral hands but this medicine was stopped due to a considerable rise in transaminase levels on repeat labs.  She has been off any medicine for over 2 weeks now and she does feel like her joint pain and stiffness is getting worse again.   Review of Systems  Constitutional:  Positive for fatigue.  HENT:  Positive for mouth dryness.   Eyes:  Positive for dryness.   Respiratory:  Positive for shortness of breath.   Cardiovascular:  Negative for swelling in legs/feet.  Gastrointestinal:  Negative for constipation.  Endocrine: Negative for increased urination.  Genitourinary:  Negative for difficulty urinating.  Musculoskeletal:  Positive for joint pain, joint pain and morning stiffness.  Skin:  Negative for rash.  Allergic/Immunologic: Negative for susceptible to infections.  Neurological:  Negative for numbness.  Hematological:  Negative for bruising/bleeding tendency.  Psychiatric/Behavioral:  Negative for sleep disturbance.    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   Back spasm 04/12/2018   Endometrial polyp 08/09/2017   Fibroids 08/09/2017   Vaginal dryness 07/18/2017   Leg edema 03/08/2017   GAD (generalized anxiety disorder) 96/22/2979   Metabolic syndrome X 89/21/1941   Erosive esophagitis 06/25/2013   GERD (gastroesophageal reflux disease) 03/22/2013   Left knee pain 03/22/2013   Snoring 08/05/2011   Urinary incontinence 08/05/2011   Vitamin D deficiency 03/21/2011   Allergic rhinitis 09/20/2010   Back pain 03/17/2010  MORBID OBESITY 02/11/2010   Essential hypertension 02/11/2010    Past Medical History:  Diagnosis Date   Angio-edema    Anxiety    Arthritis    Phreesia 08/19/2020   Carpal tunnel syndrome of right wrist    Chronic knee pain    Depression    Diabetes mellitus without complication [facility])    Endometrial polyp 08/09/2017   will get appt with Dr Elonda Husky   Fibroids 08/09/2017   Has multiple small fibroids    GERD  (gastroesophageal reflux disease)    Hypertension    Neuropathy    Obesity    Prediabetes    Seizures ([place])    1 seizure as a chold; unknown etiology and has never been on meds   Sleep apnea    did not go back to get results   Snoring    normal/ unremakable sleep study per neurology in 12/2016    Family History  Problem Relation Age of Onset   Hypertension Mother    Heart disease Mother    Diabetes Mother    Diabetes Sister    Multiple sclerosis Sister    Lupus Sister    Heart disease Sister    CVA Sister    Depression Sister    Diabetes Brother    Hypertension Brother    Other Son        brain tumor   Hypertension Daughter    Diabetes Sister    Past Surgical History:  Procedure Laterality Date   APPENDECTOMY     CARPAL TUNNEL RELEASE Right 08/01/2015   Procedure: RIGHT CARPAL TUNNEL RELEASE;  Surgeon: Carole Civil, MD;  Location: [facility];  Service: Orthopedics;  Laterality: Right;   CERVICAL POLYPECTOMY  09/14/2017   Procedure: ENDOMETRIAL POLYPECTOMY;  Surgeon: Florian Buff, MD;  Location: [facility];  Service: Gynecology;;   COLONOSCOPY N/A 06/15/2017   Procedure: COLONOSCOPY;  Surgeon: Rogene Houston, MD;  Location: [facility];  Service: Endoscopy;  Laterality: N/A;  830   ENDOMETRIAL ABLATION N/A 09/14/2017   Procedure: ENDOMETRIAL ABLATION( [place]);  Surgeon: Florian Buff, MD;  Location: [facility];  Service: Gynecology;  Laterality: N/A;   ESOPHAGOGASTRODUODENOSCOPY N/A 05/25/2013   Procedure: ESOPHAGOGASTRODUODENOSCOPY (EGD);  Surgeon: Rogene Houston, MD;  Location: [facility];  Service: Endoscopy;  Laterality: N/A;  220   GASTRIC BYPASS     HYSTEROSCOPY WITH D & C N/A 09/14/2017   Procedure: DILATATION AND CURETTAGE /HYSTEROSCOPY;  Surgeon: Florian Buff, MD;  Location: [facility];  Service: Gynecology;  Laterality: N/A;   TUBAL LIGATION     Social History   Social History Narrative   Not on file   Immunization History  Administered Date(s)  Administered   Influenza Whole 03/18/2011   Influenza,inj,Quad PF,6+ Mos 03/22/2013, 07/31/2015, 06/01/2016, 04/12/2018, 05/07/2019, 06/26/2020, 05/13/2021   Influenza-Unspecified 02/26/2017   Moderna Sars-Covid-2 Vaccination 12/05/2019, 01/02/2020, 08/01/2020   PPD Test 07/04/2013, 07/26/2013, 03/15/2016, 03/02/2017, 03/21/2018, 12/17/2019   Td 06/24/2010   Tdap 08/20/2020   Zoster Recombinat (Shingrix) 02/07/2020, 05/01/2020     Objective: Vital Signs: BP (!) 143/81 (BP Location: Left Arm, Patient Position: Sitting, Cuff Size: Large)   Pulse 83   Resp 15   Ht 5\' 6"  (1.676 m)   Wt 227 lb (103 kg)   BMI 36.64 kg/m    Physical Exam Constitutional:      Appearance: She is obese.  Cardiovascular:     Rate and Rhythm: Normal rate and regular rhythm.  Pulmonary:  Effort: Pulmonary effort is normal.     Breath sounds: Normal breath sounds.  Skin:    General: Skin is warm and dry.  Neurological:     Mental Status: She is alert.  Psychiatric:        Mood and Affect: Mood normal.     Musculoskeletal Exam:  Neck full ROM no tenderness Shoulders full ROM no tenderness or swelling Elbows full ROM no tenderness or swelling Wrists full ROM no tenderness or swelling Fingers full ROM no tenderness or swelling Knees full ROM bilateral patellofemoral crepitus no palpable effusions Ankles full ROM no tenderness or swelling   CDAI Exam: CDAI Score: 7  Patient Global: 30 mm; Provider Global: 20 mm Swollen: 0 ; Tender: 2  Joint Exam 06/02/2021      Right  Left  Knee   Tender   Tender     Investigation: No additional findings.  Imaging: No results found.  Recent Labs: Lab Results  Component Value Date   WBC 9.0 01/07/2021   HGB 12.6 01/07/2021   PLT 292 01/07/2021   NA 138 01/07/2021   K 4.5 01/07/2021   CL 103 01/07/2021   CO2 25 01/07/2021   GLUCOSE 102 (H) 01/07/2021   BUN 19 01/07/2021   CREATININE 0.96 01/07/2021   BILITOT 0.6 01/07/2021   ALKPHOS 79  07/27/2019   AST 17 01/07/2021   ALT 19 01/07/2021   PROT 7.1 01/07/2021   ALBUMIN 4.1 07/27/2019   CALCIUM 9.3 01/07/2021   GFRAA 76 01/07/2021   QFTBGOLDPLUS NEGATIVE 09/02/2020    Speciality Comments: No specialty comments available.  Procedures:  No procedures performed Allergies: Codeine, Effexor xr [venlafaxine hcl er], Fluoxetine, Hydrocodone, Methotrexate derivatives, and Latex   Assessment / Plan:     Visit Diagnoses: 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 factor ([place]) - Plan: Sedimentation rate  Disease activity appears very well improved there are no swollen joints pain is mostly localized to knees where there is also significant osteoarthritis.  We will recheck sedimentation rate for disease activity monitoring.  Plan to continue Enbrel 50 mg subcu weekly.  High risk medication use - Plan: CBC with Differential/Platelet, COMPLETE METABOLIC PANEL WITH GFR  Continued use of Enbrel checking CBC and CMP for medication monitoring today.  No adverse events TB screening reviewed from March of this year.  Bilateral primary osteoarthritis of knee  Believe the current symptom complaints are mostly due to bilateral osteoarthritis of the knees.  We reviewed treatment options for this.  Unfortunately not safe to take NSAIDs regularly with history of gastric bypass surgery but reviewed alternative treatments as well.  Orders: Orders Placed This Encounter  Procedures   Sedimentation rate   CBC with Differential/Platelet   COMPLETE METABOLIC PANEL WITH GFR    No orders of the defined types were placed in this encounter.    Follow-Up Instructions: Return in about 3 months (around 09/01/2021) for RA on ENB f/u 34mos.   Collier Salina, MD  Note - This record has been created using [employer].  Chart creation errors have been sought, but may not always  have been located. Such creation errors do not reflect on  the standard of medical  care.

## 2021-06-02 ENCOUNTER — Encounter: Payer: Self-pay | Admitting: Internal Medicine

## 2021-06-02 ENCOUNTER — Other Ambulatory Visit: Payer: Self-pay

## 2021-06-02 ENCOUNTER — Ambulatory Visit (INDEPENDENT_AMBULATORY_CARE_PROVIDER_SITE_OTHER): Payer: BC Managed Care – PPO | Admitting: Internal Medicine

## 2021-06-02 VITALS — BP 143/81 | HR 83 | Resp 15 | Ht 66.0 in | Wt 227.0 lb

## 2021-06-02 DIAGNOSIS — Z79899 Other long term (current) drug therapy: Secondary | ICD-10-CM | POA: Diagnosis not present

## 2021-06-02 DIAGNOSIS — M05741 Rheumatoid arthritis with rheumatoid factor of right hand without organ or systems involvement: Secondary | ICD-10-CM

## 2021-06-02 DIAGNOSIS — M170 Bilateral primary osteoarthritis of knee: Secondary | ICD-10-CM

## 2021-06-02 DIAGNOSIS — M05742 Rheumatoid arthritis with rheumatoid factor of left hand without organ or systems involvement: Secondary | ICD-10-CM

## 2021-06-02 NOTE — Patient Instructions (Signed)
From my exam symptoms seem to be more from osteoarthritis than from active inflammation right now.  For osteoarthritis of the knees several treatments may be beneficial: - Topical antiinflammatory medicine such as diclofenac or Voltaren can be applied to  affected area as needed but may be less effective than oral antiinflammatory medicine. Topical analgesics containing CBD, menthol, or lidocaine can be tried. Capsaicin containing treatments are recommended against for the hand.  - Oral nonsteroidal antiinflammatory medication such as ibuprofen, aleve, celebrex, or mobic are NOT recommended due to history of gastric bypass surgery  - Other oral supplements such as glucosamine chondroitin containing OTC treatments such as osteo bi-flex or other brands do not have strong data supporting effectiveness but can be helpful for some individuals and have no major side effects. Turmeric has some antiinflammatory effect similar to NSAID medications and may help, if taken as a supplement should not be taken above recommended doses.  - Physical therapy referral can discuss exercises or activity modification to improve symptoms or strength if needed. Home exercises for flexibility and strengthening of the upper legs (quadriceps) muscles can help support the knee.  - Local steroid injection is an option if symptoms become worse and not controlled by the above options.

## 2021-06-03 ENCOUNTER — Other Ambulatory Visit: Payer: Self-pay | Admitting: Family Medicine

## 2021-06-03 LAB — CBC WITH DIFFERENTIAL/PLATELET
Absolute Monocytes: 476 cells/uL (ref 200–950)
Basophils Absolute: 41 cells/uL (ref 0–200)
Basophils Relative: 0.5 %
Eosinophils Absolute: 205 cells/uL (ref 15–500)
Eosinophils Relative: 2.5 %
HCT: 39.8 % (ref 35.0–45.0)
Hemoglobin: 13.1 g/dL (ref 11.7–15.5)
Lymphs Abs: 3378 cells/uL (ref 850–3900)
MCH: 30.9 pg (ref 27.0–33.0)
MCHC: 32.9 g/dL (ref 32.0–36.0)
MCV: 93.9 fL (ref 80.0–100.0)
MPV: 10.1 fL (ref 7.5–12.5)
Monocytes Relative: 5.8 %
Neutro Abs: 4100 cells/uL (ref 1500–7800)
Neutrophils Relative %: 50 %
Platelets: 312 10*3/uL (ref 140–400)
RBC: 4.24 10*6/uL (ref 3.80–5.10)
RDW: 12.9 % (ref 11.0–15.0)
Total Lymphocyte: 41.2 %
WBC: 8.2 10*3/uL (ref 3.8–10.8)

## 2021-06-03 LAB — COMPLETE METABOLIC PANEL WITH GFR
AG Ratio: 1.3 (calc) (ref 1.0–2.5)
ALT: 17 U/L (ref 6–29)
AST: 17 U/L (ref 10–35)
Albumin: 3.9 g/dL (ref 3.6–5.1)
Alkaline phosphatase (APISO): 104 U/L (ref 37–153)
BUN: 17 mg/dL (ref 7–25)
CO2: 30 mmol/L (ref 20–32)
Calcium: 9.1 mg/dL (ref 8.6–10.4)
Chloride: 103 mmol/L (ref 98–110)
Creat: 0.71 mg/dL (ref 0.50–1.03)
Globulin: 3.1 g/dL (calc) (ref 1.9–3.7)
Glucose, Bld: 78 mg/dL (ref 65–99)
Potassium: 4.3 mmol/L (ref 3.5–5.3)
Sodium: 139 mmol/L (ref 135–146)
Total Bilirubin: 0.6 mg/dL (ref 0.2–1.2)
Total Protein: 7 g/dL (ref 6.1–8.1)
eGFR: 99 mL/min/{1.73_m2} (ref >=60)

## 2021-06-03 LAB — SEDIMENTATION RATE: Sed Rate: 31 mm/h — ABNORMAL HIGH (ref 0–30)

## 2021-06-08 NOTE — Progress Notes (Signed)
CBC and CMP look normal, okay to continue enbrel with no new changes.

## 2021-06-11 ENCOUNTER — Other Ambulatory Visit: Payer: Self-pay | Admitting: Family Medicine

## 2021-06-17 ENCOUNTER — Other Ambulatory Visit: Payer: Self-pay | Admitting: Family Medicine

## 2021-06-25 ENCOUNTER — Other Ambulatory Visit: Payer: Self-pay | Admitting: Family Medicine

## 2021-07-01 ENCOUNTER — Other Ambulatory Visit: Payer: Self-pay | Admitting: Family Medicine

## 2021-07-26 ENCOUNTER — Other Ambulatory Visit: Payer: Self-pay | Admitting: Orthopedic Surgery

## 2021-08-12 ENCOUNTER — Ambulatory Visit: Payer: BC Managed Care – PPO | Admitting: Family Medicine

## 2021-08-14 ENCOUNTER — Ambulatory Visit: Payer: BC Managed Care – PPO | Admitting: Family Medicine

## 2021-08-14 ENCOUNTER — Other Ambulatory Visit: Payer: Self-pay

## 2021-08-14 ENCOUNTER — Encounter: Payer: Self-pay | Admitting: Family Medicine

## 2021-08-14 VITALS — BP 136/83 | HR 80 | Ht 66.0 in | Wt 224.1 lb

## 2021-08-14 DIAGNOSIS — R32 Unspecified urinary incontinence: Secondary | ICD-10-CM

## 2021-08-14 DIAGNOSIS — Z13220 Encounter for screening for lipoid disorders: Secondary | ICD-10-CM | POA: Diagnosis not present

## 2021-08-14 DIAGNOSIS — I10 Essential (primary) hypertension: Secondary | ICD-10-CM | POA: Diagnosis not present

## 2021-08-14 DIAGNOSIS — R7302 Impaired glucose tolerance (oral): Secondary | ICD-10-CM

## 2021-08-14 MED ORDER — WEGOVY 0.5 MG/0.5ML ~~LOC~~ SOAJ: 0.5000 mg | SUBCUTANEOUS | 2 refills | Status: DC

## 2021-08-14 MED ORDER — WEGOVY 0.25 MG/0.5ML ~~LOC~~ SOAJ: 0.2500 mg | SUBCUTANEOUS | 0 refills | Status: DC

## 2021-08-14 NOTE — Patient Instructions (Signed)
F/U in 8 weeks, re evaluate weight and blood pressure, call if you need me sooner  Fasting lipid, cmp ad eGFr and HBa1C next week  New for weight is wegovy once weekly Let me know if unable to get this please  It is important that you exercise regularly at least 30 minutes 5 times a week. If you develop chest pain, have severe difficulty breathing, or feel very tired, stop exercising immediately and seek medical attention   Think about what you will eat, plan ahead. Choose " clean, green, fresh or frozen" over canned, processed or packaged foods which are more sugary, salty and fatty. 70 to 75% of food eaten should be vegetables and fruit. Three meals at set times with snacks allowed between meals, but they must be fruit or vegetables. Aim to eat over a 12 hour period , example 7 am to 7 pm, and STOP after  your last meal of the day. Drink water,generally about 64 ounces per day, no other drink is as healthy. Fruit juice is best enjoyed in a healthy way, by EATING the fruit.   Thanks for choosing [facility], we consider it a privelige to serve you.

## 2021-08-18 ENCOUNTER — Encounter: Payer: Self-pay | Admitting: Family Medicine

## 2021-08-18 NOTE — Assessment & Plan Note (Signed)
°  Patient re-educated about  the importance of commitment to a  minimum of 150 minutes of exercise per week as able.  The importance of healthy food choices with portion control discussed, as well as eating regularly and within a 12 hour window most days. The need to choose "clean , green" food 50 to 75% of the time is discussed, as well as to make water the primary drink and set a goal of 64 ounces water daily.    Weight /BMI 08/14/2021 06/02/2021 05/13/2021  WEIGHT 224 lb 1.9 oz 227 lb 227 lb 0.6 oz  HEIGHT 5\' 6"  5\' 6"  5\' 6"   BMI 36.17 kg/m2 36.64 kg/m2 36.65 kg/m2  Some encounter information is confidential and restricted. Go to Review Flowsheets activity to see all data.

## 2021-08-18 NOTE — Assessment & Plan Note (Signed)
DASH diet and commitment to daily physical activity for a minimum of 30 minutes discussed and encouraged, as a part of hypertension management. The importance of attaining a healthy weight is also discussed.  BP/Weight 08/14/2021 06/02/2021 05/13/2021 03/11/2021 01/07/2021 01/01/2021 9/57/4734  Systolic BP 037 096 438 381 840 375 436  Diastolic BP 83 81 83 82 75 80 74  Wt. (Lbs) 224.12 227 227.04 220.4 224.2 222.8 -  BMI 36.17 36.64 36.65 35.57 36.19 35.96 -  Some encounter information is confidential and restricted. Go to Review Flowsheets activity to see all data.   Controlled, no change in medication

## 2021-08-18 NOTE — Assessment & Plan Note (Signed)
Controlled, no change in medication  

## 2021-08-18 NOTE — Progress Notes (Signed)
° °  Julie Ryan     MRN: [id number]      DOB: December 31, 1965   HPI [name] is here for follow up and re-evaluation of chronic medical conditions, medication management and review of any available recent lab and radiology data.  Preventive health is updated, specifically  Cancer screening and Immunization.   Questions or concerns regarding consultations or procedures which the PT has had in the interim are  addressed. The PT denies any adverse reactions to current medications since the last visit.  There are no new concerns.  There are no specific complaints   ROS Denies recent fever or chills. Denies sinus pressure, nasal congestion, ear pain or sore throat. Denies chest congestion, productive cough or wheezing. Denies chest pains, palpitations and leg swelling Denies abdominal pain, nausea, vomiting,diarrhea or constipation.   Denies dysuria, frequency, hesitancy or incontinence. Denies joint pain, swelling and limitation in mobility. Denies headaches, seizures, numbness, or tingling. Denies depression, anxiety or insomnia. Denies skin break down or rash.   PE  BP 136/83    Pulse 80    Ht 5\' 6"  (1.676 m)    Wt 224 lb 1.9 oz (101.7 kg)    SpO2 93%    BMI 36.17 kg/m   Patient alert and oriented and in no cardiopulmonary distress.  HEENT: No facial asymmetry, EOMI,     Neck supple .  Chest: Clear to auscultation bilaterally.  CVS: S1, S2 no murmurs, no S3.Regular rate.  ABD: Soft non tender.   Ext: No edema  MS: Adequate ROM spine, shoulders, hips and knees.  Skin: Intact, no ulcerations or rash noted.  Psych: Good eye contact, normal affect. Memory intact not anxious or depressed appearing.  CNS: CN 2-12 intact, power,  normal throughout.no focal deficits noted.   Assessment & Plan  Urinary incontinence Controlled, no change in medication   MORBID OBESITY  Patient re-educated about  the importance of commitment to a  minimum of 150 minutes of exercise per week  as able.  The importance of healthy food choices with portion control discussed, as well as eating regularly and within a 12 hour window most days. The need to choose "clean , green" food 50 to 75% of the time is discussed, as well as to make water the primary drink and set a goal of 64 ounces water daily.    Weight /BMI 08/14/2021 06/02/2021 05/13/2021  WEIGHT 224 lb 1.9 oz 227 lb 227 lb 0.6 oz  HEIGHT 5\' 6"  5\' 6"  5\' 6"   BMI 36.17 kg/m2 36.64 kg/m2 36.65 kg/m2  Some encounter information is confidential and restricted. Go to Review Flowsheets activity to see all data.      Essential hypertension DASH diet and commitment to daily physical activity for a minimum of 30 minutes discussed and encouraged, as a part of hypertension management. The importance of attaining a healthy weight is also discussed.  BP/Weight 08/14/2021 06/02/2021 05/13/2021 03/11/2021 01/07/2021 01/01/2021 9/98/3382  Systolic BP 505 397 673 419 379 024 097  Diastolic BP 83 81 83 82 75 80 74  Wt. (Lbs) 224.12 227 227.04 220.4 224.2 222.8 -  BMI 36.17 36.64 36.65 35.57 36.19 35.96 -  Some encounter information is confidential and restricted. Go to Review Flowsheets activity to see all data.   Controlled, no change in medication

## 2021-08-20 ENCOUNTER — Encounter: Payer: Self-pay | Admitting: Family Medicine

## 2021-08-24 ENCOUNTER — Other Ambulatory Visit: Payer: Self-pay | Admitting: *Deleted

## 2021-08-24 DIAGNOSIS — M05741 Rheumatoid arthritis with rheumatoid factor of right hand without organ or systems involvement: Secondary | ICD-10-CM

## 2021-08-24 DIAGNOSIS — M05742 Rheumatoid arthritis with rheumatoid factor of left hand without organ or systems involvement: Secondary | ICD-10-CM

## 2021-08-24 MED ORDER — ENBREL MINI 50 MG/ML ~~LOC~~ SOCT: SUBCUTANEOUS | 0 refills | Status: DC

## 2021-08-24 NOTE — Telephone Encounter (Signed)
Next Visit: 09/01/2021  Last Visit: 06/02/2021  Last Fill: 04/16/2021  VG:VSYVGCYOYO arthritis involving both hands with positive rheumatoid factor   Current Dose per office note 06/02/2021:  Enbrel 50 mg subcu weekly  Labs: 06/02/2021 CBC and CMP look normal, okay to continue enbrel with no new changes.  TB Gold: 09/02/2020 Neg    Okay to refill Enbrel Mini?

## 2021-08-27 ENCOUNTER — Ambulatory Visit: Payer: BC Managed Care – PPO | Admitting: Family Medicine

## 2021-08-27 LAB — CMP14+EGFR
ALT: 15 IU/L (ref 0–32)
AST: 16 IU/L (ref 0–40)
Albumin/Globulin Ratio: 1.5 (ref 1.2–2.2)
Albumin: 4.4 g/dL (ref 3.8–4.9)
Alkaline Phosphatase: 115 IU/L (ref 44–121)
BUN/Creatinine Ratio: 18 (ref 9–23)
BUN: 14 mg/dL (ref 6–24)
Bilirubin Total: 0.6 mg/dL (ref 0.0–1.2)
CO2: 23 mmol/L (ref 20–29)
Calcium: 9.5 mg/dL (ref 8.7–10.2)
Chloride: 105 mmol/L (ref 96–106)
Creatinine, Ser: 0.77 mg/dL (ref 0.57–1.00)
Globulin, Total: 2.9 g/dL (ref 1.5–4.5)
Glucose: 75 mg/dL (ref 70–99)
Potassium: 4 mmol/L (ref 3.5–5.2)
Sodium: 144 mmol/L (ref 134–144)
Total Protein: 7.3 g/dL (ref 6.0–8.5)
eGFR: 90 mL/min/{1.73_m2} (ref >59)

## 2021-08-27 LAB — HEMOGLOBIN A1C
Est. average glucose Bld gHb Est-mCnc: 111 mg/dL
Hgb A1c MFr Bld: 5.5 % (ref 4.8–5.6)

## 2021-08-27 LAB — LIPID PANEL
Chol/HDL Ratio: 3 ratio (ref 0.0–4.4)
Cholesterol, Total: 179 mg/dL (ref 100–199)
HDL: 59 mg/dL (ref >39)
LDL Chol Calc (NIH): 105 mg/dL — ABNORMAL HIGH (ref 0–99)
Triglycerides: 84 mg/dL (ref 0–149)
VLDL Cholesterol Cal: 15 mg/dL (ref 5–40)

## 2021-08-31 NOTE — Progress Notes (Signed)
Office Visit Note  Patient: Julie Ryan             Date of Birth: Oct 22, 1965           MRN: [id number]             PCP: Fayrene Helper, MD Referring: Fayrene Helper, MD Visit Date: 09/01/2021   Subjective:  Follow-up (Doing good)   History of Present Illness: MARRION Ryan is a 56 y.o. female here for follow up for seropositive RA on Enbrel 50 mg [place] weekly. She is doing well overall and symptoms are controlled. She still has bilateral knee pain and stiffness. Morning stiffness about 20 minutes duration. 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 Pain is worse with rainy weather and cold. She takes tylenol 2-3 times per day for the knee pain. Not much associated swelling that she has noticed. Knee will catch or pop partway through movement about twice per week, this has never caused her to fall.  Previous HPI 06/02/21 GLORENE LEITZKE is a 56 y.o. female here for follow up for seropositive rheumatoid arthritis on Enbrel 50 mg subcu weekly.  Most of her symptoms are doing well but continues having considerable pain in bilateral knees.  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  Has not been much associated swelling.  Symptoms elsewhere are doing very well.  She has not had any major infection or other side effects.   Previous HPI: TYLAN BRIGUGLIO is a 56 y.o. female here for follow up for seropositive rheumatoid arthritis.  She previously started subcutaneous methotrexate 15 mg weekly and had described a improvement in her joint pain swelling and stiffness especially in the bilateral hands but this medicine was stopped due to a considerable rise in transaminase levels on repeat labs.  She has been off any medicine for over 2 weeks now and she does feel like her joint pain and stiffness is getting worse again.   Review of Systems   Constitutional:  Positive for fatigue.  HENT:  Positive for mouth dryness.   Eyes:  Positive for dryness.  Respiratory:  Negative for shortness of breath.   Cardiovascular:  Negative for swelling in legs/feet.  Gastrointestinal:  Negative for constipation.  Endocrine: Negative for excessive thirst.  Genitourinary:  Negative for difficulty urinating.  Musculoskeletal:  Positive for joint pain, gait problem, joint pain and morning stiffness.  Skin:  Negative for rash.  Allergic/Immunologic: Negative for susceptible to infections.  Neurological:  Negative for numbness.  Hematological:  Negative for bruising/bleeding tendency.  Psychiatric/Behavioral:  Negative for sleep disturbance.    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   Back spasm 04/12/2018   Endometrial polyp 08/09/2017   Fibroids 08/09/2017   Vaginal dryness 07/18/2017   Leg edema 03/08/2017   GAD (generalized anxiety disorder) 71/69/6789   Metabolic syndrome X 38/04/1750   Erosive esophagitis 06/25/2013   GERD (gastroesophageal reflux disease) 03/22/2013   Left knee pain 03/22/2013  Snoring 08/05/2011   Urinary incontinence 08/05/2011   Vitamin D deficiency 03/21/2011   Allergic rhinitis 09/20/2010   Back pain 03/17/2010   MORBID OBESITY 02/11/2010   Essential hypertension 02/11/2010    Past Medical History:  Diagnosis Date   Angio-edema    Anxiety    Arthritis    [employer]reesia 08/19/2020   Carpal tunnel syndrome of right wrist    Chronic knee pain    Depression    Diabetes mellitus without complication [facility])     Endometrial polyp 08/09/2017   will get appt with Dr Elonda Husky   Fibroids 08/09/2017   Has multiple small fibroids    GERD (gastroesophageal reflux disease)    Hypertension    Neuropathy    Obesity    Prediabetes    Seizures ([place])    1 seizure as a chold; unknown etiology and has never been on meds   Sleep apnea    did not go back to get results   Snoring    normal/ unremakable sleep study per neurology in 12/2016    Family History  Problem Relation Age of Onset   Hypertension Mother    Heart disease Mother    Diabetes Mother    Diabetes Sister    Multiple sclerosis Sister    Lupus Sister    Heart disease Sister    CVA Sister    Depression Sister    Diabetes Brother    Hypertension Brother    Other Son        brain tumor   Hypertension Daughter    Diabetes Sister    Past Surgical History:  Procedure Laterality Date   APPENDECTOMY     CARPAL TUNNEL RELEASE Right 08/01/2015   Procedure: RIGHT CARPAL TUNNEL RELEASE;  Surgeon: Carole Civil, MD;  Location: [facility];  Service: Orthopedics;  Laterality: Right;   CERVICAL POLYPECTOMY  09/14/2017   Procedure: ENDOMETRIAL POLYPECTOMY;  Surgeon: Florian Buff, MD;  Location: [facility];  Service: Gynecology;;   COLONOSCOPY N/A 06/15/2017   Procedure: COLONOSCOPY;  Surgeon: Rogene Houston, MD;  Location: [facility];  Service: Endoscopy;  Laterality: N/A;  830   ENDOMETRIAL ABLATION N/A 09/14/2017   Procedure: ENDOMETRIAL ABLATION( [place]);  Surgeon: Florian Buff, MD;  Location: [facility];  Service: Gynecology;  Laterality: N/A;   ESOPHAGOGASTRODUODENOSCOPY N/A 05/25/2013   Procedure: ESOPHAGOGASTRODUODENOSCOPY (EGD);  Surgeon: Rogene Houston, MD;  Location: [facility];  Service: Endoscopy;  Laterality: N/A;  220   GASTRIC BYPASS     HYSTEROSCOPY WITH D & C N/A 09/14/2017   Procedure: DILATATION AND CURETTAGE /HYSTEROSCOPY;  Surgeon: Florian Buff, MD;  Location: [facility];  Service: Gynecology;  Laterality: N/A;   TUBAL LIGATION      Social History   Social History Narrative   Not on file   Immunization History  Administered Date(s) Administered   Influenza Whole 03/18/2011   Influenza,inj,Quad PF,6+ Mos 03/22/2013, 07/31/2015, 06/01/2016, 02/26/2017, 04/12/2018, 05/07/2019, 06/26/2020, 05/13/2021   Influenza-Unspecified 02/26/2017   Moderna Sars-Covid-2 Vaccination 12/05/2019, 01/02/2020, 08/01/2020   PPD Test 07/04/2013, 07/26/2013, 03/15/2016, 03/02/2017, 03/21/2018, 12/17/2019   Td 06/24/2010   Tdap 08/20/2020   Zoster Recombinat (Shingrix) 02/07/2020, 05/01/2020     Objective: Vital Signs: BP (!) 147/80 (BP Location: Left Arm, Patient Position: Sitting, Cuff Size: Normal)    Pulse 80    Resp 16    Ht 5\' 6"  (1.676 m)    Wt 225 lb (102.1 kg)    BMI 36.32 kg/m  Physical Exam Constitutional:      Appearance: She is obese.  Cardiovascular:     Rate and Rhythm: Normal rate and regular rhythm.  Pulmonary:     Effort: Pulmonary effort is normal.     Breath sounds: Normal breath sounds.  Skin:    General: Skin is warm and dry.     Findings: No rash.  Neurological:     General: No focal deficit present.     Mental Status: She is alert.  Psychiatric:        Mood and Affect: Mood normal.     Musculoskeletal Exam:  Shoulders full ROM no tenderness or swelling Elbows full ROM no tenderness or swelling Wrists full ROM no tenderness or swelling Fingers full ROM no tenderness or swelling Knees full ROM joint line tenderness to pressure lateral on left knee there is crepitus and chronic enlargement no palpable effusions Ankles full ROM no tenderness or swelling   CDAI Exam: CDAI Score: 6  Patient Global: 30 mm; Provider Global: 10 mm Swollen: 0 ; Tender: 2  Joint Exam 09/01/2021      Right  Left  Knee   Tender   Tender     Investigation: No additional findings.  Imaging: No results found.  Recent Labs: Lab Results  Component Value Date   WBC 8.2 06/02/2021   HGB 13.1 06/02/2021    PLT 312 06/02/2021   NA 144 08/26/2021   K 4.0 08/26/2021   CL 105 08/26/2021   CO2 23 08/26/2021   GLUCOSE 75 08/26/2021   BUN 14 08/26/2021   CREATININE 0.77 08/26/2021   BILITOT 0.6 08/26/2021   ALKPHOS 115 08/26/2021   AST 16 08/26/2021   ALT 15 08/26/2021   PROT 7.3 08/26/2021   ALBUMIN 4.4 08/26/2021   CALCIUM 9.5 08/26/2021   GFRAA 76 01/07/2021   QFTBGOLDPLUS NEGATIVE 09/02/2020    Speciality Comments: No specialty comments available.  Procedures:  No procedures performed Allergies: Codeine, Effexor xr [venlafaxine hcl er], Fluoxetine, Hydrocodone, Methotrexate derivatives, and Latex   Assessment / Plan:     Visit Diagnoses: 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 factor ([place]) - Plan: Sedimentation rate  Appears to be doing very well at this time we will recheck sed rate for disease activity monitoring.  Plan to continue Enbrel 50 mg subcu weekly.  Bilateral primary osteoarthritis of knee  I think this is her bigger contributor to symptoms not inflammatory disease.  Unfortunately not a good candidate for NSAIDs due to history of erosive esophagitis.  Doing okay with the Tylenol.  I recommended she could be a candidate for joint injections if symptoms become more problematic.  High risk medication use - Plan: CBC with Differential/Platelet, QuantiFERON-TB Gold Plus  Checking CBC and QuantiFERON for Enbrel medication monitoring today.  Recent metabolic panel reviewed was within normal limits.  Orders: Orders Placed This Encounter  Procedures   Sedimentation rate   CBC with Differential/Platelet   QuantiFERON-TB Gold Plus   No orders of the defined types were placed in this encounter.    Follow-Up Instructions: Return in about 3 months (around 11/29/2021) for RA on ENB f/u 79mos.   Collier Salina, MD  Note - This record has been created using [employer].  Chart creation errors have been sought, but may not always  have  been located. Such creation errors do not reflect on  the standard of medical care.

## 2021-09-01 ENCOUNTER — Other Ambulatory Visit: Payer: Self-pay

## 2021-09-01 ENCOUNTER — Ambulatory Visit: Payer: BC Managed Care – PPO | Admitting: Internal Medicine

## 2021-09-01 ENCOUNTER — Encounter: Payer: Self-pay | Admitting: Internal Medicine

## 2021-09-01 VITALS — BP 147/80 | HR 80 | Resp 16 | Ht 66.0 in | Wt 225.0 lb

## 2021-09-01 DIAGNOSIS — M05742 Rheumatoid arthritis with rheumatoid factor of left hand without organ or systems involvement: Secondary | ICD-10-CM | POA: Diagnosis not present

## 2021-09-01 DIAGNOSIS — Z79899 Other long term (current) drug therapy: Secondary | ICD-10-CM

## 2021-09-01 DIAGNOSIS — M05741 Rheumatoid arthritis with rheumatoid factor of right hand without organ or systems involvement: Secondary | ICD-10-CM

## 2021-09-01 DIAGNOSIS — M170 Bilateral primary osteoarthritis of knee: Secondary | ICD-10-CM | POA: Diagnosis not present

## 2021-09-04 LAB — CBC WITH DIFFERENTIAL/PLATELET
Absolute Monocytes: 538 cells/uL (ref 200–950)
Basophils Absolute: 31 cells/uL (ref 0–200)
Basophils Relative: 0.4 %
Eosinophils Absolute: 172 cells/uL (ref 15–500)
Eosinophils Relative: 2.2 %
HCT: 41.4 % (ref 35.0–45.0)
Hemoglobin: 13.5 g/dL (ref 11.7–15.5)
Lymphs Abs: 3502 cells/uL (ref 850–3900)
MCH: 30.6 pg (ref 27.0–33.0)
MCHC: 32.6 g/dL (ref 32.0–36.0)
MCV: 93.9 fL (ref 80.0–100.0)
MPV: 9.8 fL (ref 7.5–12.5)
Monocytes Relative: 6.9 %
Neutro Abs: 3557 cells/uL (ref 1500–7800)
Neutrophils Relative %: 45.6 %
Platelets: 316 10*3/uL (ref 140–400)
RBC: 4.41 10*6/uL (ref 3.80–5.10)
RDW: 13.1 % (ref 11.0–15.0)
Total Lymphocyte: 44.9 %
WBC: 7.8 10*3/uL (ref 3.8–10.8)

## 2021-09-04 LAB — QUANTIFERON-TB GOLD PLUS
Mitogen-NIL: >10 IU/mL
NIL: 0.04 IU/mL
QuantiFERON-TB Gold Plus: NEGATIVE
TB1-NIL: 0 IU/mL
TB2-NIL: <0 IU/mL

## 2021-09-04 LAB — SEDIMENTATION RATE: Sed Rate: 29 mm/h (ref 0–30)

## 2021-09-10 NOTE — Progress Notes (Signed)
Labs look good for continuing Enbrel with no new changes.

## 2021-09-18 ENCOUNTER — Telehealth: Payer: Self-pay

## 2021-09-18 ENCOUNTER — Other Ambulatory Visit: Payer: Self-pay

## 2021-09-18 MED ORDER — WEGOVY 0.5 MG/0.5ML ~~LOC~~ SOAJ: 0.5000 mg | SUBCUTANEOUS | 2 refills | Status: DC

## 2021-09-18 NOTE — Telephone Encounter (Signed)
PA approved for the 0.5 so not sure why they arent filling it. Resent refill and asked that they please refill  ?

## 2021-09-18 NOTE — Telephone Encounter (Signed)
Patient called she states the pharmacy will not refill the 0.5 mg wegovy shes on her last 0.'25mg'$  please call the patient back at [phone] ?

## 2021-10-16 ENCOUNTER — Encounter: Payer: Self-pay | Admitting: Family Medicine

## 2021-10-16 ENCOUNTER — Ambulatory Visit: Payer: BC Managed Care – PPO | Admitting: Family Medicine

## 2021-10-16 VITALS — BP 125/79 | HR 80 | Resp 16 | Ht 66.0 in | Wt 222.0 lb

## 2021-10-16 DIAGNOSIS — Z1231 Encounter for screening mammogram for malignant neoplasm of breast: Secondary | ICD-10-CM

## 2021-10-16 DIAGNOSIS — I10 Essential (primary) hypertension: Secondary | ICD-10-CM

## 2021-10-16 DIAGNOSIS — N898 Other specified noninflammatory disorders of vagina: Secondary | ICD-10-CM

## 2021-10-16 DIAGNOSIS — E559 Vitamin D deficiency, unspecified: Secondary | ICD-10-CM | POA: Diagnosis not present

## 2021-10-16 NOTE — Patient Instructions (Addendum)
F/U in 3 months, call if you need me sooner ? ?Continue same dose of wegovy, call / message if concerns re weight loss ? ?Please schedule mammogram at checkout ? ?It is important that you exercise regularly at least 30 minutes 5 times a week. If you develop chest pain, have severe difficulty breathing, or feel very tired, stop exercising immediately and seek medical attention  ? ?Think about what you will eat, plan ahead. ?Choose " clean, green, fresh or frozen" over canned, processed or packaged foods which are more sugary, salty and fatty. ?70 to 75% of food eaten should be vegetables and fruit. ?Three meals at set times with snacks allowed between meals, but they must be fruit or vegetables. ?Aim to eat over a 12 hour period , example 7 am to 7 pm, and STOP after  your last meal of the day. ?Drink water,generally about 64 ounces per day, no other drink is as healthy. Fruit juice is best enjoyed in a healthy way, by EATING the fruit. ? ? ?Non fasting Chem 7 and EGFR and vit D level 5 days before next visit ? ?Thanks for choosing [facility] we consider it a privelige to serve you. ? ?

## 2021-10-19 ENCOUNTER — Encounter: Payer: Self-pay | Admitting: Family Medicine

## 2021-10-19 NOTE — Assessment & Plan Note (Signed)
?  Patient re-educated about  the importance of commitment to a  minimum of 150 minutes of exercise per week as able. ? ?The importance of healthy food choices with portion control discussed, as well as eating regularly and within a 12 hour window most days. ?The need to choose "clean , green" food 50 to 75% of the time is discussed, as well as to make water the primary drink and set a goal of 64 ounces water daily. ? ?  ? ?  10/16/2021  ? 10:37 AM 09/01/2021  ? 11:22 AM 08/14/2021  ? 10:30 AM  ?Weight /BMI  ?Weight 222 lb 225 lb 224 lb 1.9 oz  ?Height '5\' 6"'$  (1.676 m) '5\' 6"'$  (1.676 m) '5\' 6"'$  (1.676 m)  ?BMI 35.83 kg/m2 36.32 kg/m2 36.17 kg/m2  ? ? ? ?

## 2021-10-19 NOTE — Progress Notes (Signed)
? ?  Julie Ryan     MRN: [id number]      DOB: June 04, 1966 ? ? ?HPI ?Ms. Broberg is here for follow up and re-evaluation of chronic medical conditions, medication management and review of any available recent lab and radiology data.  ?Preventive health is updated, specifically  Cancer screening and Immunization.   ?Questions or concerns regarding consultations or procedures which the PT has had in the interim are  addressed. ?The PT denies any adverse reactions to current medications since the last visit.  ?There are no new concerns.  ?There are no specific complaints  ? ?ROS ?Denies recent fever or chills. ?Denies sinus pressure, nasal congestion, ear pain or sore throat. ?Denies chest congestion, productive cough or wheezing. ?Denies chest pains, palpitations and leg swelling ?Denies abdominal pain, nausea, vomiting,diarrhea or constipation.   ?Denies dysuria, frequency, hesitancy or incontinence. ?Denies joint pain, swelling and limitation in mobility. ?Denies headaches, seizures, numbness, or tingling. ?Denies depression, anxiety or insomnia. ?Denies skin break down or rash. ? ? ?PE ? ?BP 125/79   Pulse 80   Resp 16   Ht '5\' 6"'$  (1.676 m)   Wt 222 lb (100.7 kg)   SpO2 94%   BMI 35.83 kg/m?  ? ?Patient alert and oriented and in no cardiopulmonary distress. ? ?HEENT: No facial asymmetry, EOMI,     Neck supple . ? ?Chest: Clear to auscultation bilaterally. ? ?CVS: S1, S2 no murmurs, no S3.Regular rate. ? ?ABD: Soft non tender.  ? ?Ext: No edema ? ?MS: Adequate ROM spine, shoulders, hips and knees. ? ?Skin: Intact, no ulcerations or rash noted. ? ?Psych: Good eye contact, normal affect. Memory intact not anxious or depressed appearing. ? ?CNS: CN 2-12 intact, power,  normal throughout.no focal deficits noted. ? ? ?Assessment & Plan ? ?Essential hypertension ?Controlled, no change in medication ?DASH diet and commitment to daily physical activity for a minimum of 30 minutes discussed and encouraged, as a part of  hypertension management. ?The importance of attaining a healthy weight is also discussed. ? ? ?  10/16/2021  ? 10:37 AM 09/01/2021  ? 11:22 AM 08/14/2021  ? 10:30 AM 06/02/2021  ? 10:56 AM 05/13/2021  ?  1:14 PM 03/11/2021  ?  9:14 AM 03/11/2021  ?  8:08 AM  ?BP/Weight  ?Systolic BP 248 250 037 048 135 118 130  ?Diastolic BP 79 80 83 81 83 82 80  ?Wt. (Lbs) 222 225 224.12 227 227.04  220.4  ?BMI 35.83 kg/m2 36.32 kg/m2 36.17 kg/m2 36.64 kg/m2 36.65 kg/m2  35.57 kg/m2  ? ? ?' ? ? ?MORBID OBESITY ? ?Patient re-educated about  the importance of commitment to a  minimum of 150 minutes of exercise per week as able. ? ?The importance of healthy food choices with portion control discussed, as well as eating regularly and within a 12 hour window most days. ?The need to choose "clean , green" food 50 to 75% of the time is discussed, as well as to make water the primary drink and set a goal of 64 ounces water daily. ? ?  ? ?  10/16/2021  ? 10:37 AM 09/01/2021  ? 11:22 AM 08/14/2021  ? 10:30 AM  ?Weight /BMI  ?Weight 222 lb 225 lb 224 lb 1.9 oz  ?Height '5\' 6"'$  (1.676 m) '5\' 6"'$  (1.676 m) '5\' 6"'$  (1.676 m)  ?BMI 35.83 kg/m2 36.32 kg/m2 36.17 kg/m2  ? ? ? ? ?Vaginal dryness ?Uses topical estrogen with good effect ? ?

## 2021-10-19 NOTE — Assessment & Plan Note (Signed)
Uses topical estrogen with good effect ?

## 2021-10-19 NOTE — Assessment & Plan Note (Signed)
Controlled, no change in medication ?DASH diet and commitment to daily physical activity for a minimum of 30 minutes discussed and encouraged, as a part of hypertension management. ?The importance of attaining a healthy weight is also discussed. ? ? ?  10/16/2021  ? 10:37 AM 09/01/2021  ? 11:22 AM 08/14/2021  ? 10:30 AM 06/02/2021  ? 10:56 AM 05/13/2021  ?  1:14 PM 03/11/2021  ?  9:14 AM 03/11/2021  ?  8:08 AM  ?BP/Weight  ?Systolic BP 833 383 291 916 135 118 130  ?Diastolic BP 79 80 83 81 83 82 80  ?Wt. (Lbs) 222 225 224.12 227 227.04  220.4  ?BMI 35.83 kg/m2 36.32 kg/m2 36.17 kg/m2 36.64 kg/m2 36.65 kg/m2  35.57 kg/m2  ? ? ?' ? ?

## 2021-11-03 ENCOUNTER — Other Ambulatory Visit: Payer: Self-pay | Admitting: Internal Medicine

## 2021-11-03 DIAGNOSIS — M05741 Rheumatoid arthritis with rheumatoid factor of right hand without organ or systems involvement: Secondary | ICD-10-CM

## 2021-11-03 NOTE — Telephone Encounter (Signed)
Next Visit: 12/10/2021 ? ?Last Visit: 09/01/2021 ? ?Last Fill: 08/24/2021  ? ?DX: 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 factor  ? ?Current Dose per office note 09/01/2021: Enbrel 50 mg subcu weekly. ? ?Labs: 09/01/2021 CBC WNL 08/26/2021 CMP WNL ? ?TB Gold: 09/01/2021 Neg  ? ?Okay to refill Enbrel ?  ?

## 2021-11-03 NOTE — BH Specialist Note (Signed)
No Show

## 2021-11-04 ENCOUNTER — Telehealth: Payer: Self-pay | Admitting: Pharmacist

## 2021-11-04 NOTE — Telephone Encounter (Signed)
Received fax from [place] regarding patient's Enbrel needing PA renewal. Completed PA form and faxed clinicals ? ?Phone: [phone] ?Fax: [phone] ?Case ID: [id number] ? ?Knox Saliva, PharmD, MPH, BCPS, CPP ?Clinical Pharmacist (Rheumatology and Pulmonology)= ?

## 2021-11-05 NOTE — Telephone Encounter (Signed)
Received notification from [facility] regarding a prior authorization for ENBREL. Authorization has been APPROVED from 11/04/2021 to 11/05/2022. Approval letter sent to scan center. ? ?Authorization # [id number] ?

## 2021-11-06 ENCOUNTER — Encounter: Payer: Self-pay | Admitting: Orthopedic Surgery

## 2021-11-06 ENCOUNTER — Other Ambulatory Visit: Payer: Self-pay

## 2021-11-06 ENCOUNTER — Ambulatory Visit (INDEPENDENT_AMBULATORY_CARE_PROVIDER_SITE_OTHER): Payer: BC Managed Care – PPO | Admitting: Orthopedic Surgery

## 2021-11-06 ENCOUNTER — Encounter: Payer: Self-pay | Admitting: Family Medicine

## 2021-11-06 DIAGNOSIS — M1710 Unilateral primary osteoarthritis, unspecified knee: Secondary | ICD-10-CM

## 2021-11-06 DIAGNOSIS — M170 Bilateral primary osteoarthritis of knee: Secondary | ICD-10-CM

## 2021-11-06 MED ORDER — DICLOFENAC SODIUM 75 MG PO TBEC: DELAYED_RELEASE_TABLET | ORAL | 2 refills | Status: DC

## 2021-11-06 MED ORDER — AMLODIPINE BESYLATE 5 MG PO TABS: ORAL_TABLET | ORAL | 3 refills | Status: DC

## 2021-11-06 NOTE — Progress Notes (Signed)
FOLLOW UP  ? ?Encounter Diagnoses  ?Name Primary?  ? Primary localized osteoarthritis of knee   ? Primary osteoarthritis of both knees Yes  ? ? ? ?Chief Complaint  ?Patient presents with  ? Knee Pain  ?  Bil knee pain, pharmacy recommended she see you prior to refill Diclofenac.  Is having a lot of pain lately both knees.   ? ? ?Bmi is about 35  ? ?Julie Ryan is 56 years old she is on ember for rheumatoid arthritis follow-up in [place] she comes in with a history of 1 week increasing bilateral knee pain and she has run out of her diclofenac ? ?No new injuries ? ?[place] seems to be working fairly well as does the diclofenac ? ?She said prior to last week her knee was functioning pretty well ? ?Exam shows bilateral knee with full extension full flexion some swelling in the joints are warm ? ?Recommend trying some cortisone injections and refill the diclofenac ? ?Procedure note for bilateral knee injections ? ?Procedure note left knee injection verbal consent was obtained to inject left knee joint ? ?Timeout was completed to confirm the site of injection ? ?The medications used were 40 mg depomedrol and 3 cc of 1% lidocaine  ?Anesthesia was provided by ethyl chloride and the skin was prepped with alcohol. ? ?After cleaning the skin with alcohol a 20-gauge needle was used to inject the left knee joint. There were no complications. A sterile bandage was applied. ? ? ?Procedure note right knee injection verbal consent was obtained to inject right knee joint ? ?Timeout was completed to confirm the site of injection ? ?The medications used were 40 mg depomedrol and 3 cc of 1% lidocaine  ?Anesthesia was provided by ethyl chloride and the skin was prepped with alcohol. ? ?After cleaning the skin with alcohol a 20-gauge needle was used to inject the right knee joint. There were no complications. A sterile bandage was applied. ?Meds ordered this encounter  ?Medications  ? diclofenac (VOLTAREN) 75 MG EC tablet  ?  Sig: TAKE 1  TABLET(75 MG) BY MOUTH TWICE DAILY WITH A MEAL  ?  Dispense:  60 tablet  ?  Refill:  2  ? ?Follow-up on an as-needed basis ?

## 2021-11-29 ENCOUNTER — Other Ambulatory Visit: Payer: Self-pay | Admitting: Family Medicine

## 2021-12-01 NOTE — Progress Notes (Signed)
Office Visit Note  Patient: Julie Ryan             Date of Birth: Nov 13, 1965           MRN: [id number]             PCP: Fayrene Helper, MD Referring: Fayrene Helper, MD Visit Date: 12/10/2021   Subjective:  Follow-up (Doing good)   History of Present Illness: Julie Ryan is a 56 y.o. female here for follow up for seropositive RA on Enbrel 50 mg [place] weekly.  She is feeling well today. About 3 weeks ago saw Dr. Aline Brochure for bilateral knee steroid injections due to increased pain and some swelling in both knees for a week. Last week developed upper back pain only partially helped with flexeril and was prescribed medrol pack by Dr. Posey Pronto last dose tomorrow, and symptoms improved. She notices knee pain sometimes after Enbrel injection if she does it during the day, okay at night.  Previous HPI 09/01/2021 Julie Ryan is a 56 y.o. female here for follow up for seropositive RA on Enbrel 50 mg [place] weekly. She is doing well overall and symptoms are controlled. She still has bilateral knee pain and stiffness. Morning stiffness about 20 minutes duration. 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 Pain is worse with rainy weather and cold. She takes tylenol 2-3 times per day for the knee pain. Not much associated swelling that she has noticed. Knee will catch or pop partway through movement about twice per week, this has never caused her to fall.   Previous HPI 06/02/21 Julie Ryan is a 56 y.o. female here for follow up for seropositive rheumatoid arthritis on Enbrel 50 mg subcu weekly.  Most of her symptoms are doing well but continues having considerable pain in bilateral knees.  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  Has not been much associated swelling.  Symptoms elsewhere are doing very well.  She has not had any major infection  or other side effects.   Previous HPI: Julie Ryan is a 56 y.o. female here for follow up for seropositive rheumatoid arthritis.  She previously started subcutaneous methotrexate 15 mg weekly and had described a improvement in her joint pain swelling and stiffness especially in the bilateral hands but this medicine was stopped due to a considerable rise in transaminase levels on repeat labs.  She has been off any medicine for over 2 weeks now and she does feel like her joint pain and stiffness is getting worse again.   Review of Systems  Constitutional:  Positive for fatigue.  HENT:  Positive for mouth dryness.   Eyes:  Negative for dryness.  Respiratory:  Negative for shortness of breath.   Cardiovascular:  Positive for swelling in legs/feet.  Gastrointestinal:  Negative for constipation.  Endocrine: Positive for cold intolerance and heat intolerance.  Genitourinary:  Negative for difficulty urinating.  Musculoskeletal:  Positive for joint pain, joint pain, muscle weakness, morning stiffness and muscle tenderness.  Skin:  Negative for rash.  Allergic/Immunologic: Negative for susceptible to infections.  Neurological:  Negative for numbness.  Hematological:  Negative for bruising/bleeding tendency.  Psychiatric/Behavioral:  Negative for sleep disturbance.     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   Back spasm 04/12/2018   Endometrial polyp 08/09/2017   Fibroids 08/09/2017   Vaginal dryness 07/18/2017   Leg edema 03/08/2017   GAD (generalized anxiety disorder) 16/04/9603    Metabolic syndrome X 54/03/8118   Erosive esophagitis 06/25/2013   GERD (gastroesophageal reflux disease) 03/22/2013   Left knee pain 03/22/2013   Snoring 08/05/2011   Urinary incontinence 08/05/2011   Vitamin D deficiency 03/21/2011   Allergic rhinitis 09/20/2010   Back pain 03/17/2010   MORBID OBESITY 02/11/2010   Essential hypertension 02/11/2010    Past Medical History:  Diagnosis Date   Angio-edema    Anxiety    Arthritis    Phreesia 08/19/2020   Carpal tunnel syndrome of right wrist    Chronic knee pain    Depression    Diabetes mellitus without complication [facility])    Endometrial polyp 08/09/2017   will get appt with Dr Elonda Husky   Fibroids 08/09/2017   Has multiple small fibroids    GERD (gastroesophageal reflux disease)    Hypertension    Neuropathy    Obesity    Prediabetes    Seizures ([place])    1 seizure as a chold; unknown etiology and has never been on meds   Sleep apnea    did not go back to get results   Snoring    normal/ unremakable sleep study per neurology in 12/2016    Family History  Problem Relation Age of Onset   Hypertension Mother    Heart disease Mother    Diabetes Mother    Diabetes Sister    Multiple sclerosis Sister    Lupus Sister    Heart disease Sister    CVA Sister    Depression Sister    Diabetes Brother    Hypertension Brother    Other Son        brain tumor   Hypertension Daughter    Diabetes Sister    Past Surgical History:  Procedure Laterality Date   APPENDECTOMY     CARPAL TUNNEL RELEASE Right 08/01/2015   Procedure: RIGHT CARPAL TUNNEL RELEASE;  Surgeon: Carole Civil, MD;  Location: [facility];  Service: Orthopedics;  Laterality: Right;   CERVICAL POLYPECTOMY  09/14/2017   Procedure: ENDOMETRIAL POLYPECTOMY;  Surgeon: Florian Buff, MD;  Location: [facility];  Service: Gynecology;;   COLONOSCOPY N/A 06/15/2017   Procedure: COLONOSCOPY;  Surgeon: Rogene [place], MD;  Location: [facility];  Service: Endoscopy;   Laterality: N/A;  830   ENDOMETRIAL ABLATION N/A 09/14/2017   Procedure: ENDOMETRIAL ABLATION( [place]);  Surgeon: Florian Buff, MD;  Location: [facility];  Service: Gynecology;  Laterality: N/A;   ESOPHAGOGASTRODUODENOSCOPY N/A 05/25/2013   Procedure: ESOPHAGOGASTRODUODENOSCOPY (EGD);  Surgeon: Rogene [place], MD;  Location: [facility];  Service: Endoscopy;  Laterality: N/A;  220   GASTRIC BYPASS     HYSTEROSCOPY WITH D & C N/A 09/14/2017   Procedure: DILATATION AND CURETTAGE /HYSTEROSCOPY;  Surgeon: Florian Buff, MD;  Location: [facility];  Service: Gynecology;  Laterality: N/A;   TUBAL LIGATION     Social History   Social History Narrative   Not on file   Immunization History  Administered Date(s) Administered  Influenza Whole 03/18/2011   Influenza,inj,Quad PF,6+ Mos 03/22/2013, 07/31/2015, 06/01/2016, 02/26/2017, 04/12/2018, 05/07/2019, 06/26/2020, 05/13/2021   Influenza-Unspecified 02/26/2017   Moderna Sars-Covid-2 Vaccination 12/05/2019, 01/02/2020, 08/01/2020   PPD Test 07/04/2013, 07/26/2013, 03/15/2016, 03/02/2017, 03/21/2018, 12/17/2019   Td 06/24/2010   Tdap 08/20/2020   Zoster Recombinat (Shingrix) 02/07/2020, 05/01/2020     Objective: Vital Signs: BP 131/80 (BP Location: Left Arm, Patient Position: Sitting, Cuff Size: Normal)   Pulse 82   Resp 16   Ht '5\' 6"'$  (1.676 m)   Wt 213 lb (96.6 kg)   BMI 34.38 kg/m    Physical Exam Constitutional:      Appearance: She is obese.  Cardiovascular:     Rate and Rhythm: Normal rate and regular rhythm.  Pulmonary:     Effort: Pulmonary effort is normal.     Breath sounds: Normal breath sounds.  Musculoskeletal:     Right lower leg: No edema.     Left lower leg: No edema.  Skin:    General: Skin is warm and dry.     Findings: No rash.  Neurological:     Mental Status: She is alert.  Psychiatric:        Mood and Affect: Mood normal.      Musculoskeletal Exam:  Shoulders full ROM no tenderness or swelling Elbows  full ROM no tenderness or swelling Wrists full ROM no tenderness or swelling Fingers full ROM no tenderness or swelling Knees crepitus bilaterally no palpable effusions Ankles full ROM no tenderness or swelling   CDAI Exam: CDAI Score: 5  Patient Global: 20 mm; Provider Global: 30 mm Swollen: 0 ; Tender: 0  Joint Exam 12/10/2021   All documented joints were normal     Investigation: No additional findings.  Imaging: No results found.  Recent Labs: Lab Results  Component Value Date   WBC 7.8 09/01/2021   HGB 13.5 09/01/2021   PLT 316 09/01/2021   NA 144 08/26/2021   K 4.0 08/26/2021   CL 105 08/26/2021   CO2 23 08/26/2021   GLUCOSE 75 08/26/2021   BUN 14 08/26/2021   CREATININE 0.77 08/26/2021   BILITOT 0.6 08/26/2021   ALKPHOS 115 08/26/2021   AST 16 08/26/2021   ALT 15 08/26/2021   PROT 7.3 08/26/2021   ALBUMIN 4.4 08/26/2021   CALCIUM 9.5 08/26/2021   GFRAA 76 01/07/2021   QFTBGOLDPLUS NEGATIVE 09/01/2021    Speciality Comments: No specialty comments available.  Procedures:  No procedures performed Allergies: Codeine, Effexor xr [venlafaxine hcl er], Fluoxetine, Hydrocodone, Methotrexate derivatives, and Latex   Assessment / Plan:     Visit Diagnoses: 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 factor ([place])  Appears to be low disease activity on Enbrel 50 mg [place] qweekly plan to continue current medication. Exam today may be better than typical due to current medrol treatment.  Bilateral primary osteoarthritis of knee  Suspect flare up of knee symptoms related to her worst joint OA being at this area. Now improved after local steroid injection last month.  High risk medication use - Enbrel 50 mg subcu weekly. - Plan: CBC with Differential/Platelet, COMPLETE METABOLIC PANEL WITH GFR  Checking CBC and CMP for Enbrel medication monitoring. Previous labs including negative Quantiferon 08/2021. No interval infections.  Orders: Orders  Placed This Encounter  Procedures   CBC with Differential/Platelet   COMPLETE METABOLIC PANEL WITH GFR   No orders of the defined types were placed in this encounter.    Follow-Up Instructions: Return in  about 6 months (around 06/11/2022) for RA on ENB f/u 18mo.   CCollier Salina MD  Note - This record has been created using [employer]  Chart creation errors have been sought, but may not always  have been located. Such creation errors do not reflect on  the standard of medical care.

## 2021-12-03 ENCOUNTER — Encounter: Payer: Self-pay | Admitting: Family Medicine

## 2021-12-04 ENCOUNTER — Ambulatory Visit (INDEPENDENT_AMBULATORY_CARE_PROVIDER_SITE_OTHER): Payer: BC Managed Care – PPO | Admitting: Internal Medicine

## 2021-12-04 ENCOUNTER — Encounter: Payer: Self-pay | Admitting: Internal Medicine

## 2021-12-04 DIAGNOSIS — M549 Dorsalgia, unspecified: Secondary | ICD-10-CM | POA: Diagnosis not present

## 2021-12-04 MED ORDER — METHYLPREDNISOLONE 4 MG PO TBPK: ORAL_TABLET | ORAL | 0 refills | Status: DC

## 2021-12-04 NOTE — Progress Notes (Signed)
Virtual Visit via Telephone Note   This visit type was conducted due to national recommendations for restrictions regarding the COVID-19 Pandemic (e.g. social distancing) in an effort to limit this patient's exposure and mitigate transmission in our community.  Due to her co-morbid illnesses, this patient is at least at moderate risk for complications without adequate follow up.  This format is felt to be most appropriate for this patient at this time.  The patient did not have access to video technology/had technical difficulties with video requiring transitioning to audio format only (telephone).  All issues noted in this document were discussed and addressed.  No physical exam could be performed with this format.  Evaluation Performed:  Follow-up visit  Date:  12/04/2021   ID:  Julie Ryan, DOB Sep 04, 1965, MRN [id number]  Patient Location: Home Provider Location: Office/Clinic  Participants: Patient Location of Patient: Home Location of Provider: Telehealth Consent was obtain for visit to be over via telehealth. I verified that I am speaking with the correct person using two identifiers.  PCP:  Fayrene Helper, MD   Chief Complaint: Upper back pain  History of Present Illness:    Julie Ryan is a 56 y.o. female who has a televisit for complaint of upper back pain on left side for the last 3 days.  Pain is dull, constant and has improved compared to the first day of back pain. She denies any recent injury or fall.  She denies any numbness or tingling of UE or LE.  Denies any saddle anesthesia, urinary or stool incontinence.  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  She also has history of rheumatoid arthritis, for which she takes Enbrel.  She has been taking gabapentin and Flexeril as needed for back pain with mild relief.  The patient does not have symptoms concerning for COVID-19 infection (fever, chills, cough, or new shortness of breath).   Past Medical, Surgical,  Social History, Allergies, and Medications have been Reviewed.  Past Medical History:  Diagnosis Date   Angio-edema    Anxiety    Arthritis    Phreesia 08/19/2020   Carpal tunnel syndrome of right wrist    Chronic knee pain    Depression    Diabetes mellitus without complication [facility])    Endometrial polyp 08/09/2017   will get appt with Dr Elonda Husky   Fibroids 08/09/2017   Has multiple small fibroids    GERD (gastroesophageal reflux disease)    Hypertension    Neuropathy    Obesity    Prediabetes    Seizures ([place])    1 seizure as a chold; unknown etiology and has never been on meds   Sleep apnea    did not go back to get results   Snoring    normal/ unremakable sleep study per neurology in 12/2016   Past Surgical History:  Procedure Laterality Date   APPENDECTOMY     CARPAL TUNNEL RELEASE Right 08/01/2015   Procedure: RIGHT CARPAL TUNNEL RELEASE;  Surgeon: Carole Civil, MD;  Location: [facility];  Service: Orthopedics;  Laterality: Right;   CERVICAL POLYPECTOMY  09/14/2017   Procedure: ENDOMETRIAL POLYPECTOMY;  Surgeon: Florian Buff, MD;  Location: [facility];  Service: Gynecology;;   COLONOSCOPY N/A 06/15/2017   Procedure: COLONOSCOPY;  Surgeon: Rogene [place], MD;  [facility];  Service: Endoscopy;  Laterality: N/A;  830   ENDOMETRIAL ABLATION N/A 09/14/2017   Procedure: ENDOMETRIAL ABLATION( [place]);  Surgeon: Elonda Husky,  Mertie Clause, MD;  Location: [facility];  Service: Gynecology;  Laterality: N/A;   ESOPHAGOGASTRODUODENOSCOPY N/A 05/25/2013   Procedure: ESOPHAGOGASTRODUODENOSCOPY (EGD);  Surgeon: Rogene [place], MD;  [facility];  Service: Endoscopy;  Laterality: N/A;  220   GASTRIC BYPASS     HYSTEROSCOPY WITH D & C N/A 09/14/2017   Procedure: DILATATION AND CURETTAGE /HYSTEROSCOPY;  Surgeon: Florian Buff, MD;  Location: [facility];  Service: Gynecology;  Laterality: N/A;   TUBAL LIGATION       Current Meds  Medication Sig   acetaminophen-codeine (TYLENOL  #4) 300-60 MG tablet    amLODipine (NORVASC) 5 MG tablet TAKE 1 TABLET(5 MG) BY MOUTH DAILY   BIOTIN PO Take by mouth daily.   clotrimazole-betamethasone (LOTRISONE) cream APPLY TOPICALLY TO THE AFFECTED AREA TWICE DAILY FOR 5 DAYS THEN AS NEEDED   cyclobenzaprine (FLEXERIL) 10 MG tablet TAKE 1 TABLET(10 MG) BY MOUTH AT BEDTIME   diclofenac (VOLTAREN) 75 MG EC tablet TAKE 1 TABLET(75 MG) BY MOUTH TWICE DAILY WITH A MEAL   estradiol (ESTRACE VAGINAL) 0.1 MG/GM vaginal cream Use 1 applicator in vagina at bedtime 2-3 x weekly   Etanercept (ENBREL MINI) 50 MG/ML SOCT INSERT 1 MINI CARTRIDGE INTO AUTOINJECTOR AND INJECT 50 MG UNDER THE SKIN EVERY 7 DAYS.   Ferrous Sulfate (IRON PO) Take by mouth daily.   furosemide (LASIX) 20 MG tablet TAKE 1 TABLET BY MOUTH 2 TIMES WEEKLY AS NEEDED FOR LEG AND ANKLE SWELLING   gabapentin (NEURONTIN) 300 MG capsule TAKE 1 CAPSULE(300 MG) BY MOUTH AT BEDTIME   metoprolol tartrate (LOPRESSOR) 25 MG tablet TAKE 1 TABLET(25 MG) BY MOUTH TWICE DAILY   Polyvinyl Alcohol-Povidone (STYE OP) Apply to eye.   potassium chloride SA (KLOR-CON) 20 MEQ tablet TAKE 1 TABLET BY MOUTH TWO TIMES WEEKLY AS NEEDED, WHEN TAKING FUROSEMIDE FOR LEG SWELLING   Probiotic Product (PROBIOTIC PO) Take by mouth daily.   Semaglutide-Weight Management (WEGOVY) 0.25 MG/0.5ML SOAJ Inject 0.25 mg into the skin once a week.   Semaglutide-Weight Management (WEGOVY) 0.5 MG/0.5ML SOAJ Inject 0.5 mg into the skin once a week. Please refill or enter PA in cover my meds if needed   solifenacin (VESICARE) 10 MG tablet TAKE 1 TABLET(10 MG) BY MOUTH DAILY   triamcinolone ointment (KENALOG) 0.1 % Apply 1 application topically 2 (two) times daily as needed.   vitamin B-12 (CYANOCOBALAMIN) 500 MCG tablet Take 500 mcg by mouth daily.   Vitamin D, Ergocalciferol, (DRISDOL) 1.25 MG (50000 UNIT) CAPS capsule TAKE 1 CAPSULE BY MOUTH 1 TIME A WEEK     Allergies:   Codeine, Effexor xr [venlafaxine hcl er], Fluoxetine,  Hydrocodone, Methotrexate derivatives, and Latex   ROS:   Please see the history of present illness.     All other systems reviewed and are negative.   Labs/Other Tests and Data Reviewed:    Recent Labs: 01/08/2021: TSH 1.210 08/26/2021: ALT 15; BUN 14; Creatinine, Ser 0.77; Potassium 4.0; Sodium 144 09/01/2021: Hemoglobin 13.5; Platelets 316   Recent Lipid Panel Lab Results  Component Value Date/Time   CHOL 179 08/26/2021 01:03 PM   TRIG 84 08/26/2021 01:03 PM   HDL 59 08/26/2021 01:03 PM   CHOLHDL 3.0 08/26/2021 01:03 PM   CHOLHDL 2.8 02/05/2020 07:08 AM   LDLCALC 105 (H) 08/26/2021 01:03 PM   LDLCALC 88 02/05/2020 07:08 AM    Wt Readings from Last 3 Encounters:  10/16/21 222 lb (100.7 kg)  09/01/21 225 lb (102.1 kg)  08/14/21  224 lb 1.9 oz (101.7 kg)     ASSESSMENT & PLAN:    Upper back pain Likely paraspinal muscle strain due to chronic heavy lifting at her workplace Advised to avoid frequent bending and heavy lifting Continue Flexeril as needed for muscle spasms Medrol Dosepak, she also has history of rheumatoid arthritis Simple back exercises advised    Time:   Today, I have spent 15 minutes reviewing the chart, including problem list, medications, and with the patient with telehealth technology discussing the above problems.   Medication Adjustments/Labs and Tests Ordered: Current medicines are reviewed at length with the patient today.  Concerns regarding medicines are outlined above.   Tests Ordered: No orders of the defined types were placed in this encounter.   Medication Changes: No orders of the defined types were placed in this encounter.    Note: This dictation was prepared with Dragon dictation along with smaller phrase technology. Similar sounding words can be transcribed inadequately or may not be corrected upon review. Any transcriptional errors that result from this process are unintentional.      Disposition:  Follow up   Signed, Lindell Spar, MD  12/04/2021 9:14 AM     [facility]

## 2021-12-04 NOTE — Patient Instructions (Signed)
Please start taking prednisone as Prescribed.  Please continue taking Flexeril as needed for muscle spasm/stiffness.

## 2021-12-06 ENCOUNTER — Other Ambulatory Visit: Payer: Self-pay | Admitting: Nurse Practitioner

## 2021-12-10 ENCOUNTER — Ambulatory Visit (INDEPENDENT_AMBULATORY_CARE_PROVIDER_SITE_OTHER): Payer: BC Managed Care – PPO | Admitting: Internal Medicine

## 2021-12-10 ENCOUNTER — Encounter: Payer: Self-pay | Admitting: Internal Medicine

## 2021-12-10 VITALS — BP 131/80 | HR 82 | Resp 16 | Ht 66.0 in | Wt 213.0 lb

## 2021-12-10 DIAGNOSIS — Z79899 Other long term (current) drug therapy: Secondary | ICD-10-CM | POA: Diagnosis not present

## 2021-12-10 DIAGNOSIS — M05742 Rheumatoid arthritis with rheumatoid factor of left hand without organ or systems involvement: Secondary | ICD-10-CM | POA: Diagnosis not present

## 2021-12-10 DIAGNOSIS — M05741 Rheumatoid arthritis with rheumatoid factor of right hand without organ or systems involvement: Secondary | ICD-10-CM

## 2021-12-10 DIAGNOSIS — M170 Bilateral primary osteoarthritis of knee: Secondary | ICD-10-CM | POA: Diagnosis not present

## 2021-12-11 LAB — CBC WITH DIFFERENTIAL/PLATELET
Absolute Monocytes: 787 cells/uL (ref 200–950)
Basophils Absolute: 25 cells/uL (ref 0–200)
Basophils Relative: 0.2 %
Eosinophils Absolute: 127 cells/uL (ref 15–500)
Eosinophils Relative: 1 %
HCT: 43 % (ref 35.0–45.0)
Hemoglobin: 14 g/dL (ref 11.7–15.5)
Lymphs Abs: 4305 cells/uL — ABNORMAL HIGH (ref 850–3900)
MCH: 30.6 pg (ref 27.0–33.0)
MCHC: 32.6 g/dL (ref 32.0–36.0)
MCV: 93.9 fL (ref 80.0–100.0)
MPV: 9.7 fL (ref 7.5–12.5)
Monocytes Relative: 6.2 %
Neutro Abs: 7455 cells/uL (ref 1500–7800)
Neutrophils Relative %: 58.7 %
Platelets: 354 10*3/uL (ref 140–400)
RBC: 4.58 10*6/uL (ref 3.80–5.10)
RDW: 12.7 % (ref 11.0–15.0)
Total Lymphocyte: 33.9 %
WBC: 12.7 10*3/uL — ABNORMAL HIGH (ref 3.8–10.8)

## 2021-12-11 LAB — COMPLETE METABOLIC PANEL WITH GFR
AG Ratio: 1.2 (calc) (ref 1.0–2.5)
ALT: 16 U/L (ref 6–29)
AST: 13 U/L (ref 10–35)
Albumin: 3.9 g/dL (ref 3.6–5.1)
Alkaline phosphatase (APISO): 103 U/L (ref 37–153)
BUN: 19 mg/dL (ref 7–25)
CO2: 30 mmol/L (ref 20–32)
Calcium: 9.4 mg/dL (ref 8.6–10.4)
Chloride: 103 mmol/L (ref 98–110)
Creat: 0.84 mg/dL (ref 0.50–1.03)
Globulin: 3.2 g/dL (calc) (ref 1.9–3.7)
Glucose, Bld: 68 mg/dL (ref 65–99)
Potassium: 4.4 mmol/L (ref 3.5–5.3)
Sodium: 142 mmol/L (ref 135–146)
Total Bilirubin: 0.7 mg/dL (ref 0.2–1.2)
Total Protein: 7.1 g/dL (ref 6.1–8.1)
eGFR: 81 mL/min/{1.73_m2} (ref >=60)

## 2021-12-11 NOTE — Progress Notes (Signed)
Lab results look good no problems to continue Enbrel. Her white blood cell count is mildly increased this is probably from taking medrol.

## 2021-12-12 ENCOUNTER — Other Ambulatory Visit: Payer: Self-pay | Admitting: Family Medicine

## 2021-12-18 ENCOUNTER — Encounter: Payer: Self-pay | Admitting: Family Medicine

## 2021-12-21 ENCOUNTER — Other Ambulatory Visit: Payer: Self-pay | Admitting: Family Medicine

## 2021-12-21 NOTE — Telephone Encounter (Signed)
Please advise if ok ?

## 2021-12-22 NOTE — Telephone Encounter (Signed)
NO VOICEMAIL

## 2021-12-23 ENCOUNTER — Other Ambulatory Visit: Payer: Self-pay | Admitting: Family Medicine

## 2021-12-23 NOTE — Telephone Encounter (Signed)
NO VOICEMAIL

## 2022-01-15 ENCOUNTER — Ambulatory Visit (HOSPITAL_COMMUNITY)
Admission: RE | Admit: 2022-01-15 | Discharge: 2022-01-15 | Disposition: A | Discharge status: Home / Self Care | Payer: BC Managed Care – PPO | Source: Ambulatory Visit | Attending: Family Medicine | Admitting: Family Medicine

## 2022-01-15 ENCOUNTER — Ambulatory Visit: Payer: BC Managed Care – PPO | Admitting: Family Medicine

## 2022-01-15 DIAGNOSIS — Z1231 Encounter for screening mammogram for malignant neoplasm of breast: Secondary | ICD-10-CM | POA: Diagnosis not present

## 2022-01-19 ENCOUNTER — Ambulatory Visit: Payer: BC Managed Care – PPO | Admitting: Family Medicine

## 2022-01-27 ENCOUNTER — Other Ambulatory Visit: Payer: Self-pay | Admitting: Internal Medicine

## 2022-01-27 DIAGNOSIS — M05741 Rheumatoid arthritis with rheumatoid factor of right hand without organ or systems involvement: Secondary | ICD-10-CM

## 2022-01-27 NOTE — Telephone Encounter (Signed)
Next Visit: 06/09/2022  Last Visit: 12/10/2021  Last Fill: 11/03/2021  VA:[id number] arthritis involving both hands with positive rheumatoid factor   Current Dose per office note 12/10/2021: Enbrel 50 mg subcu weekly  Labs: 12/10/2021 Lab results look good no problems to continue Enbrel. Her white blood cell count is mildly increased this is probably from taking medrol.  TB Gold: 09/01/2021 negative   Okay to refill enbrel?

## 2022-01-28 ENCOUNTER — Ambulatory Visit: Payer: BC Managed Care – PPO | Admitting: Family Medicine

## 2022-01-28 ENCOUNTER — Encounter: Payer: Self-pay | Admitting: Family Medicine

## 2022-01-28 VITALS — BP 127/76 | HR 79 | Resp 16 | Ht 66.0 in | Wt 225.8 lb

## 2022-01-28 DIAGNOSIS — M05741 Rheumatoid arthritis with rheumatoid factor of right hand without organ or systems involvement: Secondary | ICD-10-CM

## 2022-01-28 DIAGNOSIS — M62830 Muscle spasm of back: Secondary | ICD-10-CM | POA: Diagnosis not present

## 2022-01-28 DIAGNOSIS — Z1211 Encounter for screening for malignant neoplasm of colon: Secondary | ICD-10-CM

## 2022-01-28 DIAGNOSIS — R768 Other specified abnormal immunological findings in serum: Secondary | ICD-10-CM

## 2022-01-28 DIAGNOSIS — D539 Nutritional anemia, unspecified: Secondary | ICD-10-CM

## 2022-01-28 DIAGNOSIS — R32 Unspecified urinary incontinence: Secondary | ICD-10-CM | POA: Diagnosis not present

## 2022-01-28 DIAGNOSIS — I10 Essential (primary) hypertension: Secondary | ICD-10-CM | POA: Diagnosis not present

## 2022-01-28 DIAGNOSIS — M05742 Rheumatoid arthritis with rheumatoid factor of left hand without organ or systems involvement: Secondary | ICD-10-CM

## 2022-01-28 DIAGNOSIS — E559 Vitamin D deficiency, unspecified: Secondary | ICD-10-CM

## 2022-01-28 DIAGNOSIS — R7989 Other specified abnormal findings of blood chemistry: Secondary | ICD-10-CM

## 2022-01-28 MED ORDER — SEMAGLUTIDE-WEIGHT MANAGEMENT 0.5 MG/0.5ML ~~LOC~~ SOAJ
0.5000 mg | SUBCUTANEOUS | 0 refills | Status: DC
Start: 2022-01-28 — End: 2022-02-16

## 2022-01-28 MED ORDER — PHENTERMINE HCL 37.5 MG PO TABS: 37.5000 mg | ORAL_TABLET | Freq: Every day | ORAL | 1 refills | Status: DC

## 2022-01-28 NOTE — Patient Instructions (Signed)
F/u mid October , re evaluate weight  New  for help with weight loss is once daily phentermine, stop this as soon as you are able to get wegovy again  NEED to stop sugar, sweets and sweetened drinks in order to lose weight  It is important that you exercise regularly at least 30 minutes 5 times a week. If you develop chest pain, have severe difficulty breathing, or feel very tired, stop exercising immediately and seek medical attention   Nurse please arrange cologuard test  Vit D, B12 and TSH today  Thanks for choosing [facility], we consider it a privelige to serve you.

## 2022-01-28 NOTE — Progress Notes (Signed)
Julie Ryan     MRN: [id number]      DOB: [birth year]   HPI Julie Ryan is here for follow up and re-evaluation of chronic medical conditions, medication management and review of any available recent lab and radiology data.  Preventive health is updated, specifically  Cancer screening and Immunization.   Over eating sugar , and drinking it also, with weight gain   ROS Denies recent fever or chills. Denies sinus pressure, nasal congestion, ear pain or sore throat. Denies chest congestion, productive cough or wheezing. Denies chest pains, palpitations and leg swelling Denies abdominal pain, nausea, vomiting,diarrhea or constipation.   Denies dysuria, frequency, hesitancy or incontinence. Denies joint pain, swelling and limitation in mobility. Denies headaches, seizures, numbness, or tingling. Denies depression, anxiety or insomnia. Denies skin break down or rash.   PE  BP 127/76   Pulse 79   Resp 16   Ht '5\' 6"'$  (1.676 m)   Wt 225 lb 12.8 oz (102.4 kg)   SpO2 95%   BMI 36.45 kg/m   Patient alert and oriented and in no cardiopulmonary distress.  HEENT: No facial asymmetry, EOMI,     Neck supple .  Chest: Clear to auscultation bilaterally.  CVS: S1, S2 no murmurs, no S3.Regular rate.  ABD: Soft non tender.   Ext: No edema  MS: Adequate ROM spine, shoulders, hips and knees.  Skin: Intact, no ulcerations or rash noted.  Psych: Good eye contact, normal affect. Memory intact not anxious or depressed appearing.  CNS: CN 2-12 intact, power,  normal throughout.no focal deficits noted.   Assessment & Plan  Essential hypertension Controlled, no change in medication DASH diet and commitment to daily physical activity for a minimum of 30 minutes discussed and encouraged, as a part of hypertension management. The importance of attaining a healthy weight is also discussed.     01/28/2022    9:55 AM 12/10/2021   11:21 AM 10/16/2021   10:37 AM 09/01/2021   11:22 AM 08/14/2021    10:30 AM 06/02/2021   10:56 AM 05/13/2021    1:14 PM  BP/Weight  Systolic BP 403 474 259 563 875 643 329  Diastolic BP 76 80 79 80 83 81 83  Wt. (Lbs) 225.8 213 222 225 224.12 227 227.04  BMI 36.45 kg/m2 34.38 kg/m2 35.83 kg/m2 36.32 kg/m2 36.17 kg/m2 36.64 kg/m2 36.65 kg/m2       MORBID OBESITY  Patient re-educated about  the importance of commitment to a  minimum of 150 minutes of exercise per week as able.  The importance of healthy food choices with portion control discussed, as well as eating regularly and within a 12 hour window most days. The need to choose "clean , green" food 50 to 75% of the time is discussed, as well as to make water the primary drink and set a goal of 64 ounces water daily.       01/28/2022    9:55 AM 12/10/2021   11:21 AM 10/16/2021   10:37 AM  Weight /BMI  Weight 225 lb 12.8 oz 213 lb 222 lb  Height '5\' 6"'$  (1.676 m) '5\' 6"'$  (1.676 m) '5\' 6"'$  (1.676 m)  BMI 36.45 kg/m2 34.38 kg/m2 35.83 kg/m2    Start daly phentermine until wegovy is available and change food choice  Vitamin D deficiency Controlled, no change in medication   Urinary incontinence Controlled, no change in medication   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 factor (Julie Ryan) Managed by rheumatology  and on enbrel  Back spasm Controlled, no change in medication

## 2022-01-29 LAB — TSH: TSH: 1.3 u[IU]/mL (ref 0.450–4.500)

## 2022-01-29 LAB — VITAMIN B12: Vitamin B-12: 352 pg/mL (ref 232–1245)

## 2022-01-29 LAB — VITAMIN D 25 HYDROXY (VIT D DEFICIENCY, FRACTURES): Vit D, 25-Hydroxy: 42.7 ng/mL (ref 30.0–100.0)

## 2022-01-30 ENCOUNTER — Encounter: Payer: Self-pay | Admitting: Family Medicine

## 2022-01-30 NOTE — Assessment & Plan Note (Signed)
  Patient re-educated about  the importance of commitment to a  minimum of 150 minutes of exercise per week as able.  The importance of healthy food choices with portion control discussed, as well as eating regularly and within a 12 hour window most days. The need to choose "clean , green" food 50 to 75% of the time is discussed, as well as to make water the primary drink and set a goal of 64 ounces water daily.       01/28/2022    9:55 AM 12/10/2021   11:21 AM 10/16/2021   10:37 AM  Weight /BMI  Weight 225 lb 12.8 oz 213 lb 222 lb  Height '5\' 6"'$  (1.676 m) '5\' 6"'$  (1.676 m) '5\' 6"'$  (1.676 m)  BMI 36.45 kg/m2 34.38 kg/m2 35.83 kg/m2    Start daly phentermine until wegovy is available and change food choice

## 2022-01-30 NOTE — Assessment & Plan Note (Signed)
Controlled, no change in medication  

## 2022-01-30 NOTE — Assessment & Plan Note (Signed)
Managed by rheumatology and on enbrel

## 2022-01-30 NOTE — Assessment & Plan Note (Signed)
Controlled, no change in medication DASH diet and commitment to daily physical activity for a minimum of 30 minutes discussed and encouraged, as a part of hypertension management. The importance of attaining a healthy weight is also discussed.     01/28/2022    9:55 AM 12/10/2021   11:21 AM 10/16/2021   10:37 AM 09/01/2021   11:22 AM 08/14/2021   10:30 AM 06/02/2021   10:56 AM 05/13/2021    1:14 PM  BP/Weight  Systolic BP 672 897 915 041 364 383 779  Diastolic BP 76 80 79 80 83 81 83  Wt. (Lbs) 225.8 213 222 225 224.12 227 227.04  BMI 36.45 kg/m2 34.38 kg/m2 35.83 kg/m2 36.32 kg/m2 36.17 kg/m2 36.64 kg/m2 36.65 kg/m2

## 2022-01-31 ENCOUNTER — Other Ambulatory Visit: Payer: Self-pay | Admitting: Orthopedic Surgery

## 2022-01-31 DIAGNOSIS — M1710 Unilateral primary osteoarthritis, unspecified knee: Secondary | ICD-10-CM

## 2022-02-16 ENCOUNTER — Other Ambulatory Visit: Payer: Self-pay | Admitting: Family Medicine

## 2022-03-03 ENCOUNTER — Other Ambulatory Visit: Payer: Self-pay | Admitting: Family Medicine

## 2022-03-27 ENCOUNTER — Other Ambulatory Visit: Payer: Self-pay | Admitting: Family Medicine

## 2022-04-02 ENCOUNTER — Other Ambulatory Visit: Payer: Self-pay | Admitting: Family Medicine

## 2022-04-26 ENCOUNTER — Other Ambulatory Visit: Payer: Self-pay | Admitting: Internal Medicine

## 2022-04-26 DIAGNOSIS — M05741 Rheumatoid arthritis with rheumatoid factor of right hand without organ or systems involvement: Secondary | ICD-10-CM

## 2022-04-26 NOTE — Telephone Encounter (Signed)
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 Her white blood cell count is mildly increased this is probably from taking medrol.  TB Gold: 09/01/2021 Neg   Okay to refill Enbrel?

## 2022-05-01 ENCOUNTER — Other Ambulatory Visit: Payer: Self-pay | Admitting: Urology

## 2022-05-01 ENCOUNTER — Other Ambulatory Visit: Payer: Self-pay | Admitting: Orthopedic Surgery

## 2022-05-01 DIAGNOSIS — M1710 Unilateral primary osteoarthritis, unspecified knee: Secondary | ICD-10-CM

## 2022-05-01 DIAGNOSIS — R32 Unspecified urinary incontinence: Secondary | ICD-10-CM

## 2022-05-06 ENCOUNTER — Ambulatory Visit: Payer: BC Managed Care – PPO | Admitting: Internal Medicine

## 2022-05-06 ENCOUNTER — Encounter: Payer: Self-pay | Admitting: Internal Medicine

## 2022-05-06 ENCOUNTER — Ambulatory Visit: Payer: BC Managed Care – PPO | Admitting: Family Medicine

## 2022-05-06 VITALS — BP 116/68 | HR 96 | Ht 66.0 in | Wt 213.4 lb

## 2022-05-06 DIAGNOSIS — R058 Other specified cough: Secondary | ICD-10-CM

## 2022-05-06 DIAGNOSIS — R0602 Shortness of breath: Secondary | ICD-10-CM | POA: Diagnosis not present

## 2022-05-06 DIAGNOSIS — M5441 Lumbago with sciatica, right side: Secondary | ICD-10-CM

## 2022-05-06 DIAGNOSIS — G8929 Other chronic pain: Secondary | ICD-10-CM

## 2022-05-06 DIAGNOSIS — R0789 Other chest pain: Secondary | ICD-10-CM | POA: Diagnosis not present

## 2022-05-06 DIAGNOSIS — M5442 Lumbago with sciatica, left side: Secondary | ICD-10-CM

## 2022-05-06 MED ORDER — METHYLPREDNISOLONE 4 MG PO TBPK: ORAL_TABLET | ORAL | 0 refills | Status: DC

## 2022-05-06 NOTE — Progress Notes (Signed)
Acute Office Visit  Subjective:     Patient ID: DENAYA HORN, female    DOB: Nov 03, 1965, 56 y.o.   MRN: [id number]  Chief Complaint  Patient presents with   Pain    Back (05/03/2022), right side of breast (05/05/2022), and chest (05/03/2022).    Shortness of Breath   Ms. Jakes presents today for an acute visit endorsing symptoms of back pain, chest wall pain, leg pain, and shortness of breath.  She reports onset of symptoms 5 days ago.  Her back and chest wall pain are her acute concerns today, though she does have dyspnea with exertion.  She additionally endorses a dry cough.  She endorses subjective fever/chills.  She denies any known sick contacts, though she does work at an [employer] school.  She has tried taking diclofenac and Flexeril for symptom relief without sustained improvement.  Ms. Bodenheimer denies any recent travel or extended periods of immobilization.  Review of Systems  Constitutional:  Positive for chills. Negative for fever.  Respiratory:  Positive for cough and shortness of breath. Negative for sputum production.   Musculoskeletal:  Positive for back pain.       Diffuse back pain, chest wall pain, and leg pain      Objective:    BP 116/68   Pulse 96   Ht '5\' 6"'$  (1.676 m)   Wt 213 lb 6.4 oz (96.8 kg)   SpO2 90%   BMI 34.44 kg/m   Physical Exam Constitutional:      General: She is not in acute distress.    Appearance: Normal appearance. She is obese. She is not ill-appearing or toxic-appearing.  HENT:     Head: Normocephalic and atraumatic.     Right Ear: External ear normal.     Left Ear: External ear normal.     Nose: Nose normal. No congestion or rhinorrhea.     Mouth/Throat:     Mouth: Mucous membranes are moist.     Pharynx: Oropharynx is clear. No oropharyngeal exudate or posterior oropharyngeal erythema.  Eyes:     General: No scleral icterus.    Extraocular Movements: Extraocular movements intact.     Conjunctiva/sclera: Conjunctivae normal.      Pupils: Pupils are equal, round, and reactive to light.  Cardiovascular:     Rate and Rhythm: Normal rate and regular rhythm.     Pulses: Normal pulses.     Heart sounds: Normal heart sounds.  Pulmonary:     Effort: Pulmonary effort is normal. No respiratory distress.     Breath sounds: Normal breath sounds. No wheezing, rhonchi or rales.  Chest:     Chest wall: Tenderness present.  Abdominal:     General: Abdomen is flat. Bowel sounds are normal. There is no distension.     Palpations: Abdomen is soft.     Tenderness: There is no abdominal tenderness.  Musculoskeletal:        General: No swelling or tenderness. Normal range of motion.     Cervical back: Normal range of motion.     Right lower leg: No edema.     Left lower leg: No edema.  Lymphadenopathy:     Cervical: No cervical adenopathy.  Skin:    General: Skin is warm and dry.     Capillary Refill: Capillary refill takes less than 2 seconds.     Coloration: Skin is not jaundiced.  Neurological:     General: No focal deficit present.     Mental Status:  She is alert and oriented to person, place, and time.  Psychiatric:        Mood and Affect: Mood normal.        Behavior: Behavior normal.       Assessment & Plan:   Problem List Items Addressed This Visit       Back pain - Primary    Presenting today with a history of symptoms as noted above. Her exam is benign . She is afebrile. Her pulmonary exam is unremarkable. She did not cough during our encounter and seemed more concerned with back, chest wall, and leg pain. Chest pain was reproducible on palpation. She has tried taking diclofenac and flexeril for symptom relief without sustained improvement. -I have prescribed a medrol dosepak for her symptoms today given that she has not had sustained pain relief with NSAIDs or muscle relaxers. Her presentation today is most consistent with a musculoskeletal etiology of her symptoms, though I have recommended she return to care  if her respiratory symptoms worsen.       Relevant Medications   methylPREDNISolone (MEDROL DOSEPAK) 4 MG TBPK tablet   Return in about 2 months (around 07/06/2022).  Johnette Abraham, MD

## 2022-05-06 NOTE — Patient Instructions (Signed)
It was a pleasure to see you today.  Thank you for giving [place] the opportunity to be involved in your care.  Below is a brief recap of your visit and next steps.  We will plan to see you again in January.  Summary I have prescribed a medrol dosepak today to help with your symptoms We will schedule follow up with Dr. Moshe Cipro in January but you should return to care if your symptoms are not improving by early next week.

## 2022-05-07 ENCOUNTER — Other Ambulatory Visit: Payer: Self-pay | Admitting: Family Medicine

## 2022-05-07 ENCOUNTER — Ambulatory Visit: Payer: BC Managed Care – PPO | Admitting: Internal Medicine

## 2022-05-07 ENCOUNTER — Telehealth: Payer: Self-pay | Admitting: Family Medicine

## 2022-05-07 NOTE — Telephone Encounter (Signed)
She wanted me to send you a message because the provider she seen didn't address her issues, only one. Has been having a temp since Monday, no appetite and has a cough but can't get any mucus up but she can feel it in her chest. Wants a response on her mychart sent from you

## 2022-05-07 NOTE — Telephone Encounter (Signed)
Needs in office visit

## 2022-05-07 NOTE — Telephone Encounter (Signed)
Pt called stating she was working in by a different provider yesterday & felt as her concerns were not addressed. States she mentioned she had other issues going on besides the shoulder pain. She states she has a head cold, fever and a couple other things that were not addressed. Wants to know if she can please speak with Dr. Moshe Cipro or nurse to address the concerns?

## 2022-05-08 ENCOUNTER — Encounter: Payer: Self-pay | Admitting: Family Medicine

## 2022-05-08 MED ORDER — AZITHROMYCIN 250 MG PO TABS: ORAL_TABLET | ORAL | 0 refills | Status: AC

## 2022-05-08 MED ORDER — BENZONATATE 100 MG PO CAPS: 100.0000 mg | ORAL_CAPSULE | Freq: Two times a day (BID) | ORAL | 0 refills | Status: DC | PRN

## 2022-05-08 NOTE — Telephone Encounter (Signed)
Pt message is sent,

## 2022-05-09 ENCOUNTER — Other Ambulatory Visit: Payer: Self-pay

## 2022-05-09 ENCOUNTER — Emergency Department (HOSPITAL_COMMUNITY): Payer: BC Managed Care – PPO

## 2022-05-09 ENCOUNTER — Encounter (HOSPITAL_COMMUNITY): Payer: Self-pay | Admitting: *Deleted

## 2022-05-09 ENCOUNTER — Emergency Department (HOSPITAL_COMMUNITY)
Admission: EM | Admit: 2022-05-09 | Discharge: 2022-05-09 | Disposition: A | Discharge status: Home / Self Care | Payer: BC Managed Care – PPO | Attending: Emergency Medicine | Admitting: Emergency Medicine

## 2022-05-09 DIAGNOSIS — E119 Type 2 diabetes mellitus without complications: Secondary | ICD-10-CM | POA: Insufficient documentation

## 2022-05-09 DIAGNOSIS — Z20822 Contact with and (suspected) exposure to covid-19: Secondary | ICD-10-CM | POA: Diagnosis not present

## 2022-05-09 DIAGNOSIS — Z91040 Latex allergy status: Secondary | ICD-10-CM | POA: Insufficient documentation

## 2022-05-09 DIAGNOSIS — J189 Pneumonia, unspecified organism: Secondary | ICD-10-CM | POA: Diagnosis not present

## 2022-05-09 DIAGNOSIS — I10 Essential (primary) hypertension: Secondary | ICD-10-CM | POA: Diagnosis not present

## 2022-05-09 DIAGNOSIS — Z79899 Other long term (current) drug therapy: Secondary | ICD-10-CM | POA: Insufficient documentation

## 2022-05-09 DIAGNOSIS — R0602 Shortness of breath: Secondary | ICD-10-CM | POA: Diagnosis present

## 2022-05-09 LAB — BASIC METABOLIC PANEL
Anion gap: 11 (ref 5–15)
BUN: 20 mg/dL (ref 6–20)
CO2: 28 mmol/L (ref 22–32)
Calcium: 9.5 mg/dL (ref 8.9–10.3)
Chloride: 98 mmol/L (ref 98–111)
Creatinine, Ser: 0.95 mg/dL (ref 0.44–1.00)
GFR, Estimated: >60 mL/min (ref >60)
Glucose, Bld: 123 mg/dL — ABNORMAL HIGH (ref 70–99)
Potassium: 3.5 mmol/L (ref 3.5–5.1)
Sodium: 137 mmol/L (ref 135–145)

## 2022-05-09 LAB — CBC WITH DIFFERENTIAL/PLATELET
Abs Immature Granulocytes: 0.76 10*3/uL — ABNORMAL HIGH (ref 0.00–0.07)
Basophils Absolute: 0.1 10*3/uL (ref 0.0–0.1)
Basophils Relative: 1 %
Eosinophils Absolute: 0 10*3/uL (ref 0.0–0.5)
Eosinophils Relative: 0 %
HCT: 41 % (ref 36.0–46.0)
Hemoglobin: 13.5 g/dL (ref 12.0–15.0)
Immature Granulocytes: 5 %
Lymphocytes Relative: 12 %
Lymphs Abs: 1.9 10*3/uL (ref 0.7–4.0)
MCH: 30.1 pg (ref 26.0–34.0)
MCHC: 32.9 g/dL (ref 30.0–36.0)
MCV: 91.3 fL (ref 80.0–100.0)
Monocytes Absolute: 0.8 10*3/uL (ref 0.1–1.0)
Monocytes Relative: 5 %
Neutro Abs: 12.5 10*3/uL — ABNORMAL HIGH (ref 1.7–7.7)
Neutrophils Relative %: 77 %
Platelets: 422 10*3/uL — ABNORMAL HIGH (ref 150–400)
RBC: 4.49 MIL/uL (ref 3.87–5.11)
RDW: 12.7 % (ref 11.5–15.5)
WBC: 16.1 10*3/uL — ABNORMAL HIGH (ref 4.0–10.5)
nRBC: 0 % (ref 0.0–0.2)

## 2022-05-09 LAB — RESP PANEL BY RT-PCR (FLU A&B, COVID) ARPGX2
Influenza A by PCR: NEGATIVE
Influenza B by PCR: NEGATIVE
SARS Coronavirus 2 by RT PCR: NEGATIVE

## 2022-05-09 MED ORDER — IOHEXOL 350 MG/ML SOLN
100.0000 mL | Freq: Once | INTRAVENOUS | Status: AC | PRN
  Administered 2022-05-09: 100 mL via INTRAVENOUS

## 2022-05-09 MED ORDER — AMOXICILLIN-POT CLAVULANATE 875-125 MG PO TABS
1.0000 | ORAL_TABLET | Freq: Once | ORAL | Status: AC
  Administered 2022-05-09: 1 via ORAL
  Filled 2022-05-09: qty 1

## 2022-05-09 MED ORDER — SODIUM CHLORIDE 0.9 % IV BOLUS
500.0000 mL | Freq: Once | INTRAVENOUS | Status: AC
  Administered 2022-05-09: 500 mL via INTRAVENOUS

## 2022-05-09 MED ORDER — AMOXICILLIN-POT CLAVULANATE 875-125 MG PO TABS: 1.0000 | ORAL_TABLET | Freq: Two times a day (BID) | ORAL | 0 refills | Status: DC

## 2022-05-09 MED ORDER — IPRATROPIUM-ALBUTEROL 0.5-2.5 (3) MG/3ML IN SOLN
3.0000 mL | Freq: Once | RESPIRATORY_TRACT | Status: AC
  Administered 2022-05-09: 3 mL via RESPIRATORY_TRACT
  Filled 2022-05-09: qty 3

## 2022-05-09 NOTE — ED Notes (Signed)
Spoke with Respiratory to let them know that room 7 needed a duo-neb

## 2022-05-09 NOTE — Discharge Instructions (Signed)
Take the Augmentin you are described here and the azithromycin you were prescribed by Dr. Griffin Dakin.  Return for worsening difficulty breathing.

## 2022-05-09 NOTE — ED Provider Notes (Signed)
[facility] EMERGENCY DEPARTMENT Provider Note   CSN: [id number] Arrival date & time: 05/09/22  2505     History  Chief Complaint  Patient presents with   Shortness of Breath    Julie Ryan is a 56 y.o. female.   Shortness of Breath Patient resents with shortness of breath.  Reportedly has been bad for almost a week now.  States she went to see her PCP but he did not do anything for it.  Had some right-sided chest pain.  Cough without production.  Reportedly had tactile fever also.  No dysuria.  No COVID testing.  No flu testing.  No chest x-ray.  Reportedly started on steroids.  States worsening trouble breathing today.  States she would have trouble walking to the bathroom.  No recent travel.  Does not smoke.  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    Past Medical History:  Diagnosis Date   Angio-edema    Anxiety    Arthritis    [employer] 08/19/2020   Carpal tunnel syndrome of right wrist    Chronic knee pain    Depression    Diabetes mellitus without complication [facility])    Endometrial polyp 08/09/2017   will get appt with Dr Elonda Husky   Fibroids 08/09/2017   Has multiple small fibroids    GERD (gastroesophageal reflux disease)    Hypertension    Neuropathy    Obesity    Prediabetes    Seizures ([place])    1 seizure as a chold; unknown etiology and has never been on meds   Sleep apnea    did not go back to get results   Snoring    normal/ unremakable sleep study per neurology in 12/2016    Home Medications Prior to Admission medications   Medication Sig Start Date End Date Taking? Authorizing Provider  amoxicillin-clavulanate (AUGMENTIN) 875-125 MG tablet Take 1 tablet by mouth every 12 (twelve) hours. 05/09/22  Yes Davonna Belling, MD  acetaminophen-codeine (TYLENOL #4) 300-60 MG tablet  06/24/20   [provider]  amLODipine (NORVASC) 5 MG tablet TAKE 1 TABLET(5 MG) BY MOUTH DAILY 03/03/22   Fayrene Helper, MD  azithromycin (ZITHROMAX) 250  MG tablet Take 2 tablets on day 1, then 1 tablet daily on days 2 through 5 05/08/22 05/13/22  Fayrene Helper, MD  benzonatate (TESSALON) 100 MG capsule Take 1 capsule (100 mg total) by mouth 2 (two) times daily as needed for cough. 05/08/22   Fayrene Helper, MD  BIOTIN PO Take by mouth daily.    [provider]  clotrimazole-betamethasone (LOTRISONE) cream APPLY TOPICALLY TO THE AFFECTED AREA TWICE DAILY FOR 5 DAYS THEN AS NEEDED 09/23/20   Fayrene Helper, MD  cyclobenzaprine (FLEXERIL) 10 MG tablet TAKE 1 TABLET(10 MG) BY MOUTH AT BEDTIME 06/19/20   Fayrene Helper, MD  diclofenac (VOLTAREN) 75 MG EC tablet TAKE 1 TABLET(75 MG) BY MOUTH TWICE DAILY WITH A MEAL 05/04/22   Carole Civil, MD  ENBREL MINI 50 MG/ML SOCT INSERT 1 MINI CARTRIDGE INTO AUTOINJECTOR AND INJECT 50 MG UNDER THE SKIN EVERY 7 DAYS. 04/27/22   Collier Salina, MD  estradiol (ESTRACE VAGINAL) 0.1 MG/GM vaginal cream Use 1 applicator in vagina at bedtime 2-3 x weekly 02/26/20   Derrek Monaco A, NP  Ferrous Sulfate (IRON PO) Take by mouth daily.    [provider]  furosemide (LASIX) 20 MG tablet TAKE 1 TABLET BY MOUTH 2 TIMES WEEKLY AS  NEEDED FOR LEG AND ANKLE SWELLING 03/29/22   Fayrene Helper, MD  gabapentin (NEURONTIN) 300 MG capsule Take 300 mg by mouth daily. 02/28/22   [provider]  hydrOXYzine (ATARAX) 25 MG tablet Take 25 mg by mouth 3 (three) times daily as needed for itching.    [provider]  methylPREDNISolone (MEDROL DOSEPAK) 4 MG TBPK tablet Day 1: 24 mg on day 1 administered as 8 mg before breakfast, 4 mg after lunch, 4 mg after supper, and 8 mg at bedtime or 24 mg as a single dose or divided into 2 or 3 doses upon initiation (regardless of time of day). Day 2: 20 mg on day 2 administered as 4 mg before breakfast, 4 mg after lunch, 4 mg after supper, and 8 mg at bedtime. Day 3: 16 mg on day 3 administered as 4 mg before breakfast, 4 mg after lunch,  4 mg after supper, and 4 mg at bedtime. Day 4: 12 mg on day 4 administered as 4 mg before breakfast, 4 mg after lunch, and 4 mg at bedtime. Day 5: 8 mg on day 5 administered as 4 mg before breakfast and 4 mg at bedtime. Day 6: 4 mg on day 6 administered as 4 mg before breakfast. 05/06/22   Johnette Abraham, MD  metoprolol tartrate (LOPRESSOR) 25 MG tablet TAKE 1 TABLET(25 MG) BY MOUTH TWICE DAILY 12/14/21   Fayrene Helper, MD  phentermine (ADIPEX-P) 37.5 MG tablet TAKE 1 TABLET(37.5 MG) BY MOUTH DAILY BEFORE BREAKFAST 04/02/22   Fayrene Helper, MD  potassium chloride SA (KLOR-CON) 20 MEQ tablet TAKE 1 TABLET BY MOUTH TWO TIMES WEEKLY AS NEEDED, WHEN TAKING FUROSEMIDE FOR LEG SWELLING 01/19/21   Fayrene Helper, MD  Probiotic Product (PROBIOTIC PO) Take by mouth daily.    [provider]  solifenacin (VESICARE) 10 MG tablet TAKE 1 TABLET(10 MG) BY MOUTH DAILY 05/15/21   Franchot Gallo, MD  vitamin B-12 (CYANOCOBALAMIN) 500 MCG tablet Take 500 mcg by mouth daily.    [provider]  Vitamin D, Ergocalciferol, (DRISDOL) 1.25 MG (50000 UNIT) CAPS capsule TAKE 1 CAPSULE BY MOUTH 1 TIME A WEEK 12/07/21   Paseda, Folashade R, FNP  WEGOVY 0.5 MG/0.5ML SOAJ INJECT 0.5 MG INTO THE SKIN ONCE A WEEK. Patient not taking: Reported on 05/06/2022 02/16/22   Fayrene Helper, MD  famotidine (PEPCID) 40 MG tablet Take 1 tablet (40 mg total) by mouth daily. 06/05/18 05/07/19  Fayrene Helper, MD      Allergies    Codeine, Effexor xr [venlafaxine hcl er], Fluoxetine, Hydrocodone, Methotrexate derivatives, and Latex    Review of Systems   Review of Systems  Respiratory:  Positive for shortness of breath.     Physical Exam Updated Vital Signs BP (!) 142/74   Pulse 94   Temp 98.3 F (36.8 C) (Oral)   Resp 15   Ht '5\' 6"'$  (1.676 m)   Wt 96.6 kg   SpO2 93%   BMI 34.38 kg/m  Physical Exam Vitals and nursing note reviewed.  Cardiovascular:     Rate and Rhythm: Normal rate  and regular rhythm.  Pulmonary:     Breath sounds: No wheezing, rhonchi or rales.  Chest:     Chest wall: Tenderness present.     Comments: Mild tenderness right chest wall. Abdominal:     Tenderness: There is no abdominal tenderness.  Musculoskeletal:     Comments: Some edema bilateral lower extremities, mild.  States it  is less than her normal.  Skin:    Capillary Refill: Capillary refill takes less than 2 seconds.  Neurological:     Mental Status: She is alert and oriented to person, place, and time.     ED Results / Procedures / Treatments   Labs (all labs ordered are listed, but only abnormal results are displayed) Labs Reviewed  BASIC METABOLIC PANEL - Abnormal; Notable for the following components:      Result Value   Glucose, Bld 123 (*)    All other components within normal limits  CBC WITH DIFFERENTIAL/PLATELET - Abnormal; Notable for the following components:   WBC 16.1 (*)    Platelets 422 (*)    Neutro Abs 12.5 (*)    Abs Immature Granulocytes 0.76 (*)    All other components within normal limits  RESP PANEL BY RT-PCR (FLU A&B, COVID) [id number]    EKG EKG Interpretation  Date/Time:  Sunday May 09 2022 10:19:15 EST Ventricular Rate:  80 PR Interval:  162 QRS Duration: 84 QT Interval:  407 QTC Calculation: 470 R Axis:   10 Text Interpretation: Sinus rhythm Confirmed by Davonna Belling [phone]) on 05/09/2022 10:32:31 AM  Radiology CT Angio Chest PE W and/or Wo Contrast  Result Date: 05/09/2022 CLINICAL DATA:  Shortness of breath EXAM: CT ANGIOGRAPHY CHEST WITH CONTRAST TECHNIQUE: Multidetector CT imaging of the chest was performed using the standard protocol during bolus administration of intravenous contrast. Multiplanar CT image reconstructions and MIPs were obtained to evaluate the vascular anatomy. RADIATION DOSE REDUCTION: This exam was performed according to the departmental dose-optimization program which includes automated exposure control,  adjustment of the mA and/or kV according to patient size and/or use of iterative reconstruction technique. CONTRAST:  158m OMNIPAQUE IOHEXOL 350 MG/ML SOLN COMPARISON:  Chest radiographs done earlier today FINDINGS: Cardiovascular: Heart is enlarged in size. There is homogeneous enhancement in thoracic aorta. There are no intraluminal filling defects in central pulmonary artery branches. Evaluation of small peripheral branches is limited by infiltrates. Mediastinum/Nodes: No significant lymphadenopathy seen. Lungs/Pleura: There are large alveolar infiltrates in right middle lobe, both lower lobes and lingula. Small right pleural effusion is seen. There is no pneumothorax. Upper Abdomen: There is fatty infiltration in the liver. Surgical staples are seen in the stomach suggesting previous bariatric surgery. Musculoskeletal: Degenerative changes are noted in both shoulders. Review of the MIP images confirms the above findings. IMPRESSION: There is no evidence of central pulmonary artery embolism. There is no evidence of thoracic aortic dissection. Cardiomegaly. There are large alveolar infiltrates in right middle lobe, both lower lobes and lingula suggesting multifocal pneumonia. Small right pleural effusion. Fatty liver. Electronically Signed   By: PElmer PickerM.D.   On: 05/09/2022 15:32   DG Chest 2 View  Result Date: 05/09/2022 CLINICAL DATA:  Shortness of breath. EXAM: CHEST - 2 VIEW COMPARISON:  08/08/2014 FINDINGS: 1034 hours. Streaky opacity in the lung bases suggest atelectasis although pneumonia cannot be excluded. Possible tiny left pleural effusion. Vascular congestion without overt airspace pulmonary edema. Cardiopericardial silhouette is at upper limits of normal for size. Telemetry leads overlie the chest. IMPRESSION: 1. Streaky opacity in the lung bases suggests atelectasis although pneumonia cannot be excluded. 2. Possible tiny left pleural effusion. Electronically Signed   By: EMisty StanleyM.D.   On: 05/09/2022 11:02    Procedures Procedures    Medications Ordered in ED Medications  amoxicillin-clavulanate (AUGMENTIN) 875-125 MG per tablet 1 tablet (has no administration in time range)  ipratropium-albuterol (  DUONEB) 0.5-2.5 (3) MG/3ML nebulizer solution 3 mL (3 mLs Nebulization Given 05/09/22 1329)  sodium chloride 0.9 % bolus 500 mL (500 mLs Intravenous New Bag/Given 05/09/22 1503)  iohexol (OMNIPAQUE) 350 MG/ML injection 100 mL (100 mLs Intravenous Contrast Given 05/09/22 1452)    ED Course/ Medical Decision Making/ A&P                           Medical Decision Making Amount and/or Complexity of Data Reviewed Labs: ordered. Radiology: ordered.  Risk Prescription drug management.   Patient with chest pain on the right chest.  Shortness of breath.  Nonproductive cough.  Is had for about a week now.  States she feels more short of breath.  States her oxygen was high because she was having trouble breathing.  Will get imaging.  Pneumonia/viral infection/pneumothorax on the differential diagnosis.  Doubt cardiac cause.  Pulmonary embolism also felt less likely.  We will also ambulate with pulse ox.  Ambulate with pulse ox.  Felt dyspneic but did not become hypoxic.  With the dyspnea CT scan done and did show multifocal pneumonia.  However without the hypoxia I think she can manage as an outpatient.  Had been prescribed azithromycin by PCP yesterday.  Will add Augmentin for broader coverage.  Appears stable for discharge however.        Final Clinical Impression(s) / ED Diagnoses Final diagnoses:  Community acquired pneumonia, unspecified laterality    Rx / DC Orders ED Discharge Orders          Ordered    amoxicillin-clavulanate (AUGMENTIN) 875-125 MG tablet  Every 12 hours        05/09/22 1542              Davonna Belling, MD 05/09/22 1543

## 2022-05-09 NOTE — ED Triage Notes (Signed)
Pt c/o SOB, generalized body aches, dry cough, fever, headache since last Monday. Pt saw her doctor on Friday but did not have testing for Covid, flu nor have CXR or blood work drawn.

## 2022-05-10 ENCOUNTER — Other Ambulatory Visit: Payer: Self-pay | Admitting: Family Medicine

## 2022-05-10 ENCOUNTER — Encounter: Payer: Self-pay | Admitting: Family Medicine

## 2022-05-10 MED ORDER — PROMETHAZINE-DM 6.25-15 MG/5ML PO SYRP: ORAL_SOLUTION | ORAL | 0 refills | Status: DC

## 2022-05-12 NOTE — Assessment & Plan Note (Signed)
Presenting today with a history of symptoms as noted above. Her exam is benign . She is afebrile. Her pulmonary exam is unremarkable. She did not cough during our encounter and seemed more concerned with back, chest wall, and leg pain. Chest pain was reproducible on palpation. She has tried taking diclofenac and flexeril for symptom relief without sustained improvement. -I have prescribed a medrol dosepak for her symptoms today given that she has not had sustained pain relief with NSAIDs or muscle relaxers. Her presentation today is most consistent with a musculoskeletal etiology of her symptoms, though I have recommended she return to care if her respiratory symptoms worsen.

## 2022-05-12 NOTE — Assessment & Plan Note (Deleted)
Presenting today with a history of symptoms as noted above. Her exam is benign . She is afebrile. Her pulmonary exam is unremarkable. She did not cough during our encounter and seemed more concerned with back, chest wall, and leg pain. Chest pain was reproducible on palpation. She has tried taking diclofenac and flexeril for symptom relief without sustained improvement. -I have prescribed a medrol dosepak for her symptoms today given that she has not had sustained pain relief with NSAIDs or muscle relaxers. Her presentation today is most consistent with a musculoskeletal etiology of her symptoms, though I have recommended she return to care if her respiratory symptoms worsen.

## 2022-05-13 ENCOUNTER — Other Ambulatory Visit: Payer: Self-pay | Admitting: Family Medicine

## 2022-05-13 ENCOUNTER — Encounter: Payer: Self-pay | Admitting: Family Medicine

## 2022-05-13 ENCOUNTER — Ambulatory Visit: Payer: BC Managed Care – PPO | Admitting: Family Medicine

## 2022-05-13 VITALS — BP 132/72 | HR 80 | Ht 66.0 in | Wt 213.0 lb

## 2022-05-13 DIAGNOSIS — M05741 Rheumatoid arthritis with rheumatoid factor of right hand without organ or systems involvement: Secondary | ICD-10-CM

## 2022-05-13 DIAGNOSIS — M05742 Rheumatoid arthritis with rheumatoid factor of left hand without organ or systems involvement: Secondary | ICD-10-CM

## 2022-05-13 DIAGNOSIS — J189 Pneumonia, unspecified organism: Secondary | ICD-10-CM | POA: Diagnosis not present

## 2022-05-13 DIAGNOSIS — I10 Essential (primary) hypertension: Secondary | ICD-10-CM

## 2022-05-13 MED ORDER — FLUCONAZOLE 150 MG PO TABS: 150.0000 mg | ORAL_TABLET | Freq: Once | ORAL | 0 refills | Status: AC

## 2022-05-13 NOTE — Assessment & Plan Note (Addendum)
Work note to return 11/13, out from 10/31, needs rept chest x ray n in 5 weeks cBC and diff today, complete antibiotic course in 10 days total

## 2022-05-13 NOTE — Patient Instructions (Signed)
Follow-up in January as before call if you need me sooner.  Work excuse from October 31 to return May 17, 2022 with no restrictions.to be provided today at this visit.  You need a repeat chest x-ray December 15 the order is already in.  This is to reevaluate your lungs.  CBC and differential today.  You have been diagnosed with multifocal pneumonia.  It is important that you rest and take all prescribed medication as directed until completed.Keep well hydrated and eat healthily  If your symptoms worsen you need to contact us.  Currently you are improved over the past several days however you do still need to remain at home for an additional 5 days prior to returning to work.  Thanks for choosing [facility], we consider it a privelige to serve you.

## 2022-05-13 NOTE — Progress Notes (Signed)
   Julie Ryan     MRN: [id number]      DOB: [birth year]   HPI Julie Ryan is here for follow up of recent eD visit when  dx with multifocal pneumonia. Feels better , no recent chills or documented  fever, still coughing, energy level at 50 % Needs work excuse starting from 10/31, and will be able to return 11/13, without restriction  Need rept chest X ray in next 5 weeks ROS . Denies sinus pressure, nasal congestion, ear pain or sore throat. Denies chest congestion, productive cough or wheezing. Denies chest pains, palpitations and leg swelling Denies abdominal pain, nausea, vomiting,diarrhea or constipation.   Denies dysuria, frequency, hesitancy or incontinence. Denies joint pain, swelling and limitation in mobility. Denies headaches, seizures, numbness, or tingling. Denies depression, anxiety or insomnia. Denies skin break down or rash.   PE  BP 132/74 (BP Location: Right Arm, Patient Position: Sitting, Cuff Size: Large)   Pulse 80   Ht '5\' 6"'$  (1.676 m)   Wt 213 lb (96.6 kg)   SpO2 95%   BMI 34.38 kg/m   Patient alert and oriented and in no cardiopulmonary distress.  HEENT: No facial asymmetry, EOMI,     Neck supple .  Chest: decreased though adequate air entry, bibasilar crackles, no wheezes  CVS: S1, S2 no murmurs, no S3.Regular rate.  ABD: Soft non tender.   Ext: No edema  MS: Adequate ROM spine, shoulders, hips and knees.  Skin: Intact, no ulcerations or rash noted.  Psych: Good eye contact, normal affect. Memory intact not anxious or depressed appearing.  CNS: CN 2-12 intact, power,  normal throughout.no focal deficits noted.   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  The importance of healthy food choices with portion control discussed, as well as eating regularly and within a 12 hour window most days. The need to choose "clean , green" food 50 to 75% of the time is discussed, as well as to make water the primary drink and set a goal of 64 ounces water daily.       05/13/2022    9:03 AM 05/09/2022   10:15 AM 05/06/2022    3:47 PM  Weight /BMI  Weight 213 lb 213 lb 213 lb 6.4 oz  Height '5\' 6"'$  (1.676 m) '5\' 6"'$  (1.676 m) '5\' 6"'$  (1.676 m)  BMI 34.38 kg/m2 34.38 kg/m2 34.44 kg/m2

## 2022-05-13 NOTE — Assessment & Plan Note (Signed)
Controlled, no change in medication  

## 2022-05-14 ENCOUNTER — Other Ambulatory Visit: Payer: Self-pay | Admitting: Family Medicine

## 2022-05-14 LAB — CBC WITH DIFFERENTIAL/PLATELET
Basophils Absolute: 0 10*3/uL (ref 0.0–0.2)
Basos: 0 %
EOS (ABSOLUTE): 0.1 10*3/uL (ref 0.0–0.4)
Eos: 1 %
Hematocrit: 41.6 % (ref 34.0–46.6)
Hemoglobin: 13.4 g/dL (ref 11.1–15.9)
Immature Grans (Abs): 0.2 10*3/uL — ABNORMAL HIGH (ref 0.0–0.1)
Immature Granulocytes: 1 %
Lymphocytes Absolute: 3.3 10*3/uL — ABNORMAL HIGH (ref 0.7–3.1)
Lymphs: 20 %
MCH: 29.5 pg (ref 26.6–33.0)
MCHC: 32.2 g/dL (ref 31.5–35.7)
MCV: 92 fL (ref 79–97)
Monocytes Absolute: 0.7 10*3/uL (ref 0.1–0.9)
Monocytes: 4 %
Neutrophils Absolute: 12.5 10*3/uL — ABNORMAL HIGH (ref 1.4–7.0)
Neutrophils: 74 %
Platelets: 503 10*3/uL — ABNORMAL HIGH (ref 150–450)
RBC: 4.54 x10E6/uL (ref 3.77–5.28)
RDW: 12.4 % (ref 11.7–15.4)
WBC: 16.9 10*3/uL — ABNORMAL HIGH (ref 3.4–10.8)

## 2022-05-14 MED ORDER — PHENTERMINE HCL 37.5 MG PO TABS: 37.5000 mg | ORAL_TABLET | Freq: Every day | ORAL | 2 refills | Status: DC

## 2022-05-14 NOTE — Telephone Encounter (Signed)
Please send electronically if you would like  

## 2022-05-16 ENCOUNTER — Encounter: Payer: Self-pay | Admitting: Family Medicine

## 2022-05-16 NOTE — Assessment & Plan Note (Signed)
Higher risk for compication with infection due to immunosuppressant therapy, rept WBC still significantly elevated with a left shift , will need to be repeated in 5 weeks also

## 2022-05-16 NOTE — Assessment & Plan Note (Signed)
  Patient re-educated about  the importance of commitment to a  minimum of 150 minutes of exercise per week as able.  The importance of healthy food choices with portion control discussed, as well as eating regularly and within a 12 hour window most days. The need to choose "clean , green" food 50 to 75% of the time is discussed, as well as to make water the primary drink and set a goal of 64 ounces water daily.       05/13/2022    9:03 AM 05/09/2022   10:15 AM 05/06/2022    3:47 PM  Weight /BMI  Weight 213 lb 213 lb 213 lb 6.4 oz  Height '5\' 6"'$  (1.676 m) '5\' 6"'$  (1.676 m) '5\' 6"'$  (1.676 m)  BMI 34.38 kg/m2 34.38 kg/m2 34.44 kg/m2

## 2022-05-17 ENCOUNTER — Other Ambulatory Visit: Payer: Self-pay | Admitting: Family Medicine

## 2022-05-17 DIAGNOSIS — I10 Essential (primary) hypertension: Secondary | ICD-10-CM

## 2022-05-19 ENCOUNTER — Other Ambulatory Visit: Payer: Self-pay | Admitting: Nurse Practitioner

## 2022-05-21 ENCOUNTER — Telehealth: Payer: Self-pay | Admitting: Family Medicine

## 2022-05-24 ENCOUNTER — Ambulatory Visit (HOSPITAL_COMMUNITY)
Admission: RE | Admit: 2022-05-24 | Discharge: 2022-05-24 | Disposition: A | Discharge status: Home / Self Care | Payer: BC Managed Care – PPO | Source: Ambulatory Visit | Attending: Family Medicine | Admitting: Family Medicine

## 2022-05-24 DIAGNOSIS — J189 Pneumonia, unspecified organism: Secondary | ICD-10-CM | POA: Insufficient documentation

## 2022-05-25 NOTE — Progress Notes (Signed)
LMTRC

## 2022-06-04 ENCOUNTER — Other Ambulatory Visit: Payer: Self-pay | Admitting: Urology

## 2022-06-04 DIAGNOSIS — R32 Unspecified urinary incontinence: Secondary | ICD-10-CM

## 2022-06-07 ENCOUNTER — Other Ambulatory Visit: Payer: Self-pay | Admitting: Family Medicine

## 2022-06-09 ENCOUNTER — Ambulatory Visit: Payer: BC Managed Care – PPO | Admitting: Internal Medicine

## 2022-06-19 ENCOUNTER — Other Ambulatory Visit: Payer: Self-pay | Admitting: Family Medicine

## 2022-06-22 NOTE — Progress Notes (Unsigned)
Office Visit Note  Patient: Julie Ryan             Date of Birth: [birth year]           MRN: [id number]             PCP: Fayrene Helper, MD Referring: Fayrene Helper, MD Visit Date: 06/23/2022   Subjective:  No chief complaint on file.   History of Present Illness: Julie Ryan is a 56 y.o. female here for follow up for seropositive RA on Enbrel 50 mg [place] weekly. ***   Previous HPI 12/10/21 ???a Julie Ryan is a 56 y.o. female here for follow up for seropositive RA on Enbrel 50 mg [place] weekly.  She is feeling well today. About 3 weeks ago saw Dr. Aline Brochure for bilateral knee steroid injections due to increased pain and some swelling in both knees for a week. Last week developed upper back pain only partially helped with flexeril and was prescribed medrol pack by Dr. Posey Pronto last dose tomorrow, and symptoms improved. She notices knee pain sometimes after Enbrel injection if she does it during the day, okay at night.   Previous HPI 09/01/2021 Julie Ryan is a 56 y.o. female here for follow up for seropositive RA on Enbrel 50 mg [place] weekly. She is doing well overall and symptoms are controlled. She still has bilateral knee pain and stiffness. Morning stiffness about 20 minutes duration. 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 Pain is worse with rainy weather and cold. She takes tylenol 2-3 times per day for the knee pain. Not much associated swelling that she has noticed. Knee will catch or pop partway through movement about twice per week, this has never caused her to fall.   Previous HPI 06/02/21 Julie Ryan is a 56 y.o. female here for follow up for seropositive rheumatoid arthritis on Enbrel 50 mg subcu weekly.  Most of her symptoms are doing well but continues having considerable pain in bilateral knees.  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  Has not been much associated swelling.  Symptoms elsewhere are doing very well.  She has not had any major infection or other side effects.   Previous HPI: Julie Ryan is a 56 y.o. female here for follow up for seropositive rheumatoid arthritis.  She previously started subcutaneous methotrexate 15 mg weekly and had described a improvement in her joint pain swelling and stiffness especially in the bilateral hands but this medicine was stopped due to a considerable rise in transaminase levels on repeat labs.  She has been off any medicine for over 2 weeks now and she does feel like her joint pain and stiffness is getting worse again.    No Rheumatology ROS completed.   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   Back spasm 04/12/2018   Endometrial polyp 08/09/2017   Fibroids 08/09/2017   Vaginal dryness 07/18/2017   Leg  edema 03/08/2017   GAD (generalized anxiety disorder) 23/55/7322   Metabolic syndrome X 02/54/2706   Erosive esophagitis 06/25/2013   GERD (gastroesophageal reflux disease) 03/22/2013   Left knee pain 03/22/2013   Snoring 08/05/2011   Urinary incontinence 08/05/2011   Vitamin Julie deficiency 03/21/2011   Allergic rhinitis 09/20/2010   Back pain 03/17/2010   MORBID OBESITY 02/11/2010   Essential hypertension 02/11/2010    Past Medical History:  Diagnosis Date   Angio-edema    Anxiety    Arthritis    Phreesia 08/19/2020   Carpal tunnel syndrome of right wrist    Chronic knee pain    Depression    Diabetes mellitus  without complication [facility])    Endometrial polyp 08/09/2017   will get appt with Dr Elonda Husky   Fibroids 08/09/2017   Has multiple small fibroids    GERD (gastroesophageal reflux disease)    Hypertension    Neuropathy    Obesity    Prediabetes    Seizures ([place])    1 seizure as a chold; unknown etiology and has never been on meds   Sleep apnea    did not go back to get results   Snoring    normal/ unremakable sleep study per neurology in 12/2016    Family History  Problem Relation Age of Onset   Hypertension Mother    Heart disease Mother    Diabetes Mother    Diabetes Sister    Multiple sclerosis Sister    Lupus Sister    Heart disease Sister    CVA Sister    Depression Sister    Diabetes Brother    Hypertension Brother    Other Son        brain tumor   Hypertension Daughter    Diabetes Sister    Past Surgical History:  Procedure Laterality Date   APPENDECTOMY     CARPAL TUNNEL RELEASE Right 08/01/2015   Procedure: RIGHT CARPAL TUNNEL RELEASE;  Surgeon: Carole Civil, MD;  Location: [facility];  Service: Orthopedics;  Laterality: Right;   CERVICAL POLYPECTOMY  09/14/2017   Procedure: ENDOMETRIAL POLYPECTOMY;  Surgeon: Florian Buff, MD;  Location: [facility];  Service: Gynecology;;   COLONOSCOPY N/A 06/15/2017   Procedure: COLONOSCOPY;  Surgeon: Rogene Houston, MD;  Location: [facility] SU[facility];  Service: Endoscopy;  Laterality: N/A;  830   ENDOMETRIAL ABLATION N/A 09/14/2017   Procedure: ENDOMETRIAL ABLATION( [place]);  Surgeon: Florian Buff, MD;  Location: [facility];  Service: Gynecology;  Laterality: N/A;   ESOPHAGOGASTRODUODENOSCOPY N/A 05/25/2013   Procedure: ESOPHAGOGASTRODUODENOSCOPY (EGD);  Surgeon: Rogene Houston, MD;  Location: [facility];  Service: Endoscopy;  Laterality: N/A;  220   GASTRIC BYPASS     HYSTEROSCOPY WITH Julie & C N/A 09/14/2017   Procedure: DILATATION AND CURETTAGE /HYSTEROSCOPY;  Surgeon: Florian Buff, MD;  Location: [facility];  Service: Gynecology;   Laterality: N/A;   TUBAL LIGATION     Social History   Social History Narrative   Not on file   Immunization History  Administered Date(s) Administered   Influenza Whole 03/18/2011   Influenza,inj,Quad PF,6+ Mos 03/22/2013, 07/31/2015, 06/01/2016, 02/26/2017, 04/12/2018, 05/07/2019, 06/26/2020, 05/13/2021   Influenza-Unspecified 02/26/2017   Moderna Sars-Covid-2 Vaccination 12/05/2019, 01/02/2020, 08/01/2020   PPD Test 07/04/2013, 07/26/2013, 03/15/2016, 03/02/2017, 03/21/2018, 12/17/2019   Td 06/24/2010   Tdap 08/20/2020   Zoster Recombinat (Shingrix) 02/07/2020, 05/01/2020     Objective: Vital Signs: There were no vitals taken for this visit.  Physical Exam   Musculoskeletal Exam: ***  CDAI Exam: CDAI Score: -- Patient Global: --; Provider Global: -- Swollen: --; Tender: -- Joint Exam 06/23/2022   No joint exam has been documented for this visit   There is currently no information documented on the homunculus. Go to the Rheumatology activity and complete the homunculus joint exam.  Investigation: No additional findings.  Imaging: DG Chest 2 View  Result Date: 05/24/2022 CLINICAL DATA:  Follow-up pneumonia. EXAM: CHEST - 2 VIEW COMPARISON:  05/09/2022 FINDINGS: Heart size is normal. There has been improvement in aeration both lung bases. Persistent airspace filling opacities are present, RIGHT greater than LEFT. No pulmonary edema or new consolidation. IMPRESSION: 1. Improved aeration. 2. Persistent airspace filling opacities, RIGHT greater than LEFT. Followup PA and lateral chest X-ray is recommended in 3-4 weeks. Electronically Signed   By: Nolon Nations M.Julie.   On: 05/24/2022 14:52    Recent Labs: Lab Results  Component Value Date   WBC 16.9 (H) 05/13/2022   HGB 13.4 05/13/2022   PLT 503 (H) 05/13/2022   NA 137 05/09/2022   K 3.5 05/09/2022   CL 98 05/09/2022   CO2 28 05/09/2022   GLUCOSE 123 (H) 05/09/2022   BUN 20 05/09/2022   CREATININE 0.95  05/09/2022   BILITOT 0.7 12/10/2021   ALKPHOS 115 08/26/2021   AST 13 12/10/2021   ALT 16 12/10/2021   PROT 7.1 12/10/2021   ALBUMIN 4.4 08/26/2021   CALCIUM 9.5 05/09/2022   GFRAA 76 01/07/2021   QFTBGOLDPLUS NEGATIVE 09/01/2021    Speciality Comments: No specialty comments available.  Procedures:  No procedures performed Allergies: Codeine, Effexor xr [venlafaxine hcl er], Fluoxetine, Hydrocodone, Methotrexate derivatives, and Latex   Assessment / Plan:     Visit Diagnoses: No diagnosis found.  ***  Orders: No orders of the defined types were placed in this encounter.  No orders of the defined types were placed in this encounter.    Follow-Up Instructions: No follow-ups on file.   Collier Salina, MD  Note - This record has been created using [employer].  Chart creation errors have been sought, but may not always  have been located. Such creation errors do not reflect on  the standard of medical care.

## 2022-06-23 ENCOUNTER — Encounter: Payer: Self-pay | Admitting: Internal Medicine

## 2022-06-23 ENCOUNTER — Ambulatory Visit: Payer: BC Managed Care – PPO | Attending: Internal Medicine | Admitting: Internal Medicine

## 2022-06-23 VITALS — BP 135/83 | HR 80 | Resp 15 | Ht 66.0 in | Wt 217.0 lb

## 2022-06-23 DIAGNOSIS — M05742 Rheumatoid arthritis with rheumatoid factor of left hand without organ or systems involvement: Secondary | ICD-10-CM

## 2022-06-23 DIAGNOSIS — Z79899 Other long term (current) drug therapy: Secondary | ICD-10-CM | POA: Diagnosis not present

## 2022-06-23 DIAGNOSIS — M05741 Rheumatoid arthritis with rheumatoid factor of right hand without organ or systems involvement: Secondary | ICD-10-CM

## 2022-06-24 LAB — CBC WITH DIFFERENTIAL/PLATELET
Absolute Monocytes: 694 cells/uL (ref 200–950)
Basophils Absolute: 41 cells/uL (ref 0–200)
Basophils Relative: 0.6 %
Eosinophils Absolute: 150 cells/uL (ref 15–500)
Eosinophils Relative: 2.2 %
HCT: 36 % (ref 35.0–45.0)
Hemoglobin: 12.1 g/dL (ref 11.7–15.5)
Lymphs Abs: 2917 cells/uL (ref 850–3900)
MCH: 30.6 pg (ref 27.0–33.0)
MCHC: 33.6 g/dL (ref 32.0–36.0)
MCV: 90.9 fL (ref 80.0–100.0)
MPV: 10 fL (ref 7.5–12.5)
Monocytes Relative: 10.2 %
Neutro Abs: 2999 cells/uL (ref 1500–7800)
Neutrophils Relative %: 44.1 %
Platelets: 331 10*3/uL (ref 140–400)
RBC: 3.96 10*6/uL (ref 3.80–5.10)
RDW: 13.1 % (ref 11.0–15.0)
Total Lymphocyte: 42.9 %
WBC: 6.8 10*3/uL (ref 3.8–10.8)

## 2022-06-24 LAB — COMPLETE METABOLIC PANEL WITH GFR
AG Ratio: 1.3 (calc) (ref 1.0–2.5)
ALT: 14 U/L (ref 6–29)
AST: 16 U/L (ref 10–35)
Albumin: 3.8 g/dL (ref 3.6–5.1)
Alkaline phosphatase (APISO): 97 U/L (ref 37–153)
BUN: 13 mg/dL (ref 7–25)
CO2: 30 mmol/L (ref 20–32)
Calcium: 9.2 mg/dL (ref 8.6–10.4)
Chloride: 104 mmol/L (ref 98–110)
Creat: 0.83 mg/dL (ref 0.50–1.03)
Globulin: 3 g/dL (calc) (ref 1.9–3.7)
Glucose, Bld: 78 mg/dL (ref 65–99)
Potassium: 4.2 mmol/L (ref 3.5–5.3)
Sodium: 141 mmol/L (ref 135–146)
Total Bilirubin: 0.8 mg/dL (ref 0.2–1.2)
Total Protein: 6.8 g/dL (ref 6.1–8.1)
eGFR: 82 mL/min/{1.73_m2} (ref >=60)

## 2022-06-24 LAB — SEDIMENTATION RATE: Sed Rate: 46 mm/h — ABNORMAL HIGH (ref 0–30)

## 2022-06-24 NOTE — Progress Notes (Signed)
Lab results look fine for continuing Enbrel CBC and CMP are entirely normal. Sed rate is increased at 46 this is most likely from residual inflammation after the recent pneumonia.

## 2022-06-25 ENCOUNTER — Encounter: Payer: Self-pay | Admitting: Family Medicine

## 2022-06-25 ENCOUNTER — Other Ambulatory Visit: Payer: Self-pay | Admitting: Family Medicine

## 2022-06-25 MED ORDER — PROMETHAZINE-DM 6.25-15 MG/5ML PO SYRP: ORAL_SOLUTION | ORAL | 0 refills | Status: DC

## 2022-06-25 NOTE — Telephone Encounter (Signed)
Please send if you would like. 

## 2022-06-27 ENCOUNTER — Other Ambulatory Visit: Payer: Self-pay | Admitting: Family Medicine

## 2022-07-06 ENCOUNTER — Ambulatory Visit (INDEPENDENT_AMBULATORY_CARE_PROVIDER_SITE_OTHER): Payer: BC Managed Care – PPO | Admitting: Family Medicine

## 2022-07-06 ENCOUNTER — Ambulatory Visit (HOSPITAL_COMMUNITY)
Admission: RE | Admit: 2022-07-06 | Discharge: 2022-07-06 | Disposition: A | Discharge status: Home / Self Care | Payer: BC Managed Care – PPO | Source: Ambulatory Visit | Attending: Family Medicine | Admitting: Family Medicine

## 2022-07-06 ENCOUNTER — Other Ambulatory Visit: Payer: Self-pay

## 2022-07-06 ENCOUNTER — Encounter: Payer: Self-pay | Admitting: Family Medicine

## 2022-07-06 VITALS — BP 116/61 | HR 78 | Ht 66.0 in | Wt 218.0 lb

## 2022-07-06 DIAGNOSIS — Z8701 Personal history of pneumonia (recurrent): Secondary | ICD-10-CM | POA: Diagnosis present

## 2022-07-06 DIAGNOSIS — M25362 Other instability, left knee: Secondary | ICD-10-CM

## 2022-07-06 DIAGNOSIS — R9389 Abnormal findings on diagnostic imaging of other specified body structures: Secondary | ICD-10-CM | POA: Diagnosis not present

## 2022-07-06 DIAGNOSIS — R0602 Shortness of breath: Secondary | ICD-10-CM | POA: Insufficient documentation

## 2022-07-06 DIAGNOSIS — J189 Pneumonia, unspecified organism: Secondary | ICD-10-CM

## 2022-07-06 DIAGNOSIS — I10 Essential (primary) hypertension: Secondary | ICD-10-CM

## 2022-07-06 DIAGNOSIS — R7302 Impaired glucose tolerance (oral): Secondary | ICD-10-CM

## 2022-07-06 MED ORDER — SEMAGLUTIDE-WEIGHT MANAGEMENT 0.5 MG/0.5ML ~~LOC~~ SOAJ: 0.5000 mg | SUBCUTANEOUS | 1 refills | Status: DC

## 2022-07-06 MED ORDER — SEMAGLUTIDE-WEIGHT MANAGEMENT 0.25 MG/0.5ML ~~LOC~~ SOAJ: 0.2500 mg | SUBCUTANEOUS | 0 refills | Status: DC

## 2022-07-06 MED ORDER — TIRZEPATIDE-WEIGHT MANAGEMENT 2.5 MG/0.5ML ~~LOC~~ SOAJ: 2.5000 mg | SUBCUTANEOUS | 0 refills | Status: DC

## 2022-07-06 NOTE — Assessment & Plan Note (Signed)
  Patient re-educated about  the importance of commitment to a  minimum of 150 minutes of exercise per week as able.  The importance of healthy food choices with portion control discussed, as well as eating regularly and within a 12 hour window most days. The need to choose "clean , green" food 50 to 75% of the time is discussed, as well as to make water the primary drink and set a goal of 64 ounces water daily.       07/06/2022   10:49 AM 06/23/2022    3:47 PM 05/13/2022    9:03 AM  Weight /BMI  Weight 218 lb 0.6 oz 217 lb 213 lb  Height '5\' 6"'$  (1.676 m) '5\' 6"'$  (1.676 m) '5\' 6"'$  (1.676 m)  BMI 35.19 kg/m2 35.02 kg/m2 34.38 kg/m2    Phentermine not effective/ no benefit Change to zepbound and fllow change planned of eliminating unhealthy snacks

## 2022-07-06 NOTE — Progress Notes (Signed)
Julie Ryan     MRN: [id number]      DOB: Jun 08, 1966   HPI Julie Ryan is here for follow up and re-evaluation of chronic medical conditions,in particular cough and shortness of breath with a dx of  possible multifocal pneumonia on chest scan on 05/09/2022  Despite antibiotic treatment, she continue to experience significant poor exercise tolerance,  intermittent right chest pain, denies fever , cough sputum or chills,never wer really very present and CXR  2 weeks from original, on 11/20/2023l reported persistent opacities. If x ray today is  abn with her current symptoms, she wil be referred to Pulmonary specialist [facility].  C/o dry mouth and no weight loss with phentermine  ROS Denies recent fever or chills.c/o shortness of breath and poor exercise tolerance Denies sinus pressure, nasal congestion, ear pain or sore throat. Denies chest congestion, productive cough or wheezing. Denies  palpitations and leg swelling Denies abdominal pain, nausea, vomiting,diarrhea or constipation.   Denies dysuria, frequency, hesitancy or incontinence. Chronic  joint pain, swelling and limitation in mobility. Denies headaches, seizures, numbness, or tingling. Denies depression, anxiety or insomnia. Denies skin break down or rash.   PE  BP 116/61 (BP Location: Left Arm, Patient Position: Sitting, Cuff Size: Large)   Pulse 78   Ht '5\' 6"'$  (1.676 m)   Wt 218 lb 0.6 oz (98.9 kg)   SpO2 94%   BMI 35.19 kg/m   Patient alert and oriented and in no cardiopulmonary distress.  HEENT: No facial asymmetry, EOMI,     Neck supple .  Chest: Clear to auscultation bilaterally.Decreasd in lung bases, no wheezes or crackles hard  CVS: S1, S2 no murmurs, no S3.Regular rate.  ABD: Soft non tender.   Ext: No edema  MS: decreased  ROM lumbar spine,  and knees.  Skin: Intact, no ulcerations or rash noted.  Psych: Good eye contact, normal affect. Memory intact not anxious or depressed appearing.  CNS: CN  2-12 intact, power,  normal throughout.no focal deficits noted.   Assessment & Plan Multifocal pneumonia Persistent dyspnea andpoor exercise tolerance with right chest pain, rept CXR on day of visit reports persistent opacities , refer Pulmonary, soonest available  Shortness of breath Persisten since 11/05 with rept CXR still abnormal, refer Pulmonary  Knee instability, left Requesting hndicap sicker  recommended Ortho to Provide  MORBID OBESITY  Patient re-educated about  the importance of commitment to a  minimum of 150 minutes of exercise per week as able.  The importance of healthy food choices with portion control discussed, as well as eating regularly and within a 12 hour window most days. The need to choose "clean , green" food 50 to 75% of the time is discussed, as well as to make water the primary drink and set a goal of 64 ounces water daily.       07/06/2022   10:49 AM 06/23/2022    3:47 PM 05/13/2022    9:03 AM  Weight /BMI  Weight 218 lb 0.6 oz 217 lb 213 lb  Height '5\' 6"'$  (1.676 m) '5\' 6"'$  (1.676 m) '5\' 6"'$  (1.676 m)  BMI 35.19 kg/m2 35.02 kg/m2 34.38 kg/m2    Phentermine not effective/ no benefit Change to zepbound and fllow change planned of eliminating unhealthy snacks  Essential hypertension Controlled, no change in medication DASH diet and commitment to daily physical activity for a minimum of 30 minutes discussed and encouraged, as a part of hypertension management. The importance of attaining a healthy  weight is also discussed.     07/06/2022   10:49 AM 06/23/2022    3:47 PM 05/13/2022    9:25 AM 05/13/2022    9:10 AM 05/13/2022    9:03 AM 05/09/2022    3:30 PM 05/09/2022    3:00 PM  BP/Weight  Systolic BP 102 548 628 241 753 010 404  Diastolic BP 61 83 72 74 89 68 74  Wt. (Lbs) 218.04 217   213    BMI 35.19 kg/m2 35.02 kg/m2   34.38 kg/m2

## 2022-07-06 NOTE — Patient Instructions (Addendum)
F/U in 6 to 8 weeks, call if you need me sooner  New script sent form weight loss , zepbound weekly, stop phentermine  Lab today, hBA1C, chem 7 and EGFR  Please get CXR today, you will be contacted with result this pm  You are referred to Pulmonary  because of symptom of shortness of breath , and residual chest pain following the pneumonia  Pls contact Orthopedic re need for handicap sticker   Thanks for choosing [facility], we consider it a privelige to serve you.

## 2022-07-06 NOTE — Assessment & Plan Note (Signed)
Persisten since 11/05 with rept CXR still abnormal, refer Pulmonary

## 2022-07-06 NOTE — Assessment & Plan Note (Signed)
Controlled, no change in medication DASH diet and commitment to daily physical activity for a minimum of 30 minutes discussed and encouraged, as a part of hypertension management. The importance of attaining a healthy weight is also discussed.     07/06/2022   10:49 AM 06/23/2022    3:47 PM 05/13/2022    9:25 AM 05/13/2022    9:10 AM 05/13/2022    9:03 AM 05/09/2022    3:30 PM 05/09/2022    3:00 PM  BP/Weight  Systolic BP 161 096 045 [phone] 782  Diastolic BP 61 83 72 74 89 68 74  Wt. (Lbs) 218.04 217   213    BMI 35.19 kg/m2 35.02 kg/m2   34.38 kg/m2

## 2022-07-06 NOTE — Assessment & Plan Note (Signed)
Requesting hndicap sicker  recommended Ortho to Provide

## 2022-07-06 NOTE — Assessment & Plan Note (Signed)
Persistent dyspnea andpoor exercise tolerance with right chest pain, rept CXR on day of visit reports persistent opacities , refer Pulmonary, soonest available

## 2022-07-07 LAB — BMP8+EGFR
BUN/Creatinine Ratio: 19 (ref 9–23)
BUN: 14 mg/dL (ref 6–24)
CO2: 25 mmol/L (ref 20–29)
Calcium: 9.2 mg/dL (ref 8.7–10.2)
Chloride: 102 mmol/L (ref 96–106)
Creatinine, Ser: 0.75 mg/dL (ref 0.57–1.00)
Glucose: 91 mg/dL (ref 70–99)
Potassium: 4.8 mmol/L (ref 3.5–5.2)
Sodium: 140 mmol/L (ref 134–144)
eGFR: 92 mL/min/{1.73_m2} (ref >59)

## 2022-07-07 LAB — HEMOGLOBIN A1C
Est. average glucose Bld gHb Est-mCnc: 108 mg/dL
Hgb A1c MFr Bld: 5.4 % (ref 4.8–5.6)

## 2022-07-14 ENCOUNTER — Encounter: Payer: Self-pay | Admitting: Pulmonary Disease

## 2022-07-14 ENCOUNTER — Ambulatory Visit (INDEPENDENT_AMBULATORY_CARE_PROVIDER_SITE_OTHER): Payer: BC Managed Care – PPO | Admitting: Pulmonary Disease

## 2022-07-14 VITALS — BP 128/72 | HR 72 | Temp 98.4°F | Ht 66.0 in | Wt 218.8 lb

## 2022-07-14 DIAGNOSIS — R0609 Other forms of dyspnea: Secondary | ICD-10-CM

## 2022-07-14 MED ORDER — ALBUTEROL SULFATE HFA 108 (90 BASE) MCG/ACT IN AERS: 2.0000 | INHALATION_SPRAY | Freq: Four times a day (QID) | RESPIRATORY_TRACT | 6 refills | Status: DC | PRN

## 2022-07-14 NOTE — Patient Instructions (Signed)
Albuterol two puffs as needed for cough, wheeze, chest congestion or shortness of breath  Will arrange for pulmonary function test and echocardiogram at [facility]  Follow up in 2 months

## 2022-07-14 NOTE — Progress Notes (Signed)
[facility]  Chief Complaint  Patient presents with   Consult    SOB with activity since November      Past Surgical History:  She  has a past surgical history that includes Tubal ligation; Appendectomy; Esophagogastroduodenoscopy (N/A, 05/25/2013); Carpal tunnel release (Right, 08/01/2015); Colonoscopy (N/A, 06/15/2017); Hysteroscopy with D & C (N/A, 09/14/2017); Endometrial ablation (N/A, 09/14/2017); Cervical polypectomy (09/14/2017); and Gastric bypass.  Past Medical History:  Angioedema from food allergy , Rheumatoid arthritis, Depression, DM type 2, Uterine fibroids, GERD, HTN, Neuropathy, Seizure, s/p Gastric bypass  Constitutional:  BP 128/72   Pulse 72   Temp 98.4 F (36.9 C)   Ht '5\' 6"'$  (1.676 m)   Wt 218 lb 12.8 oz (99.2 kg)   SpO2 97% Comment: ra  BMI 35.32 kg/m   Brief Summary:  Julie Ryan is a 57 y.o. female former smoker with dyspnea.      Subjective:   She developed back pain, fever, and fatigue in November.  She was found to have pneumonia.  She was treated with prednisone and antibiotics.  Since then she has noticed trouble with her breathing.  She is okay at rest.  She gets winded after walking about 100 feet.  She recovers after resting again for about 5 minutes.  She has intermittent cough.  No having sinus congestion, sore throat, chest pain, palpitations, dizziness, wheezing, or leg swelling.  Hasn't noticed any trouble with her breathing when asleep.  No prior history of pneumonia or tuberculosis.  She quit smoking several years ago, and a pack of cigarettes could last her a couple of weeks.  No occupational exposures.  No prior history of asthma.  No family history of lung disease.  She has not tried an inhaler before.  Her chest xray from 07/06/22 shows basilar atelectasis and scarring, but clearing of previous infiltrates compared to November 2023.  Labs from 06/23/22 showed Hb 13.4.  Her CT chest did showed  enlarged heart size.  Physical Exam:   Appearance - well kempt   ENMT - no sinus tenderness, no oral exudate, no LAN, Mallampati 3 airway, no stridor  Respiratory - equal breath sounds bilaterally, no wheezing or rales  CV - s1s2 regular rate and rhythm, no murmurs  Ext - no clubbing, no edema  Skin - no rashes  Psych - normal mood and affect   Pulmonary testing:    Chest Imaging:  CT chest 05/09/22 >> enlarged heart; alveolar infiltrates in RML, lingula, and both lower lobes, fatty liver  Sleep Tests:  PSG 02/14/17 >> AHI 1.4, SpO2 low 92%  Cardiac Tests:    Social History:  She  reports that she quit smoking about 3 years ago. Her smoking use included cigarettes. She has a 5.00 pack-year smoking history. She has been exposed to tobacco smoke. She has never used smokeless tobacco. She reports that she does not drink alcohol and does not use drugs.  Family History:  Her family history includes CVA in her sister; Depression in her sister; Diabetes in her brother, mother, sister, and sister; Heart disease in her mother and sister; Hypertension in her brother, daughter, and mother; Lupus in her sister; Multiple sclerosis in her sister; Other in her son.    Discussion:  She has developed dyspnea on exertion after having community acquired pneumonia in November 2023.  She has mild prior history of smoking cigarettes.  She has a history of hypertension and her CT chest showed enlarged  heart size.    Assessment/Plan:   Dyspnea on exertion. - will arrange for pulmonary function test to assess for obstructive lung disease - will give her trial of albuterol prn - will arrange for echocardiogram to assess for diastolic dysfunction - explained how she could have developed a degree of deconditioning after having multilobar pneumonia and this could be contributing to her dyspnea  Rheumatoid arthritis with positive rheumatoid factor. - followed by Dr. Vernelle Emerald with  rheumatology - remains on enbrel   Time Spent Involved in Patient Care on Day of Examination:  51 minutes  Follow up:   Patient Instructions  Albuterol two puffs as needed for cough, wheeze, chest congestion or shortness of breath  Will arrange for pulmonary function test and echocardiogram at [facility]  Follow up in 2 months  Medication List:   Allergies as of 07/14/2022       Reactions   Codeine Nausea Only, Other (See Comments)   Dizziness    Effexor Xr [venlafaxine Hcl Er] Other (See Comments)   Makes patient feel on edge    Fluoxetine Other (See Comments)   States that med caused memory problems and crying spells    Hydrocodone Nausea Only, Other (See Comments)   Dizziness    Methotrexate Derivatives    Caused elevation in liver function tests   Latex Itching, Rash        Medication List        Accurate as of July 14, 2022  1:59 PM. If you have any questions, ask your nurse or doctor.          acetaminophen-codeine 300-60 MG tablet Commonly known as: TYLENOL #4   albuterol 108 (90 Base) MCG/ACT inhaler Commonly known as: VENTOLIN HFA Inhale 2 puffs into the lungs every 6 (six) hours as needed for wheezing or shortness of breath. Started by: Chesley Mires, MD   amLODipine 5 MG tablet Commonly known as: NORVASC TAKE 1 TABLET(5 MG) BY MOUTH DAILY   benzonatate 100 MG capsule Commonly known as: TESSALON TAKE 1 CAPSULE(100 MG) BY MOUTH TWICE DAILY AS NEEDED FOR COUGH   BIOTIN PO Take by mouth daily.   diclofenac 75 MG EC tablet Commonly known as: VOLTAREN TAKE 1 TABLET(75 MG) BY MOUTH TWICE DAILY WITH A MEAL   Enbrel Mini 50 MG/ML Soct Generic drug: Etanercept INSERT 1 MINI CARTRIDGE INTO AUTOINJECTOR AND INJECT 50 MG UNDER THE SKIN EVERY 7 DAYS.   furosemide 20 MG tablet Commonly known as: LASIX TAKE 1 TABLET BY MOUTH 2 TIMES WEEKLY AS NEEDED FOR LEG AND ANKLE SWELLING   gabapentin 300 MG capsule Commonly known as: NEURONTIN TAKE 1  CAPSULE(300 MG) BY MOUTH AT BEDTIME   IRON PO Take by mouth daily.   metoprolol tartrate 25 MG tablet Commonly known as: LOPRESSOR TAKE 1 TABLET(25 MG) BY MOUTH TWICE DAILY   potassium chloride SA 20 MEQ tablet Commonly known as: KLOR-CON M TAKE 1 TABLET BY MOUTH TWO TIMES WEEKLY AS NEEDED, WHEN TAKING FUROSEMIDE FOR LEG SWELLING   PROBIOTIC PO Take by mouth daily.   Semaglutide-Weight Management 0.25 MG/0.5ML Soaj Inject 0.25 mg into the skin once a week.   Semaglutide-Weight Management 0.5 MG/0.5ML Soaj Inject 0.5 mg into the skin once a week. Start taking on: August 03, 2022   solifenacin 10 MG tablet Commonly known as: VESICARE TAKE 1 TABLET(10 MG) BY MOUTH DAILY   vitamin B-12 500 MCG tablet Commonly known as: CYANOCOBALAMIN Take 500 mcg by mouth daily.  Vitamin D (Ergocalciferol) 1.25 MG (50000 UNIT) Caps capsule Commonly known as: DRISDOL TAKE 1 CAPSULE BY MOUTH 1 TIME A WEEK        Signature:  Chesley Mires, MD [place] Pager - [phone] 07/14/2022, 1:59 PM

## 2022-07-19 ENCOUNTER — Other Ambulatory Visit: Payer: Self-pay | Admitting: Family Medicine

## 2022-07-21 ENCOUNTER — Encounter: Payer: Self-pay | Admitting: Family Medicine

## 2022-07-21 ENCOUNTER — Other Ambulatory Visit: Payer: Self-pay | Admitting: Internal Medicine

## 2022-07-21 DIAGNOSIS — M05741 Rheumatoid arthritis with rheumatoid factor of right hand without organ or systems involvement: Secondary | ICD-10-CM

## 2022-07-21 MED ORDER — TIRZEPATIDE-WEIGHT MANAGEMENT 2.5 MG/0.5ML ~~LOC~~ SOAJ: 2.5000 mg | SUBCUTANEOUS | 0 refills | Status: DC

## 2022-07-21 NOTE — Telephone Encounter (Signed)
Please schedule patient a follow up visit. Patient due March 2024. Thanks!  

## 2022-07-21 NOTE — Telephone Encounter (Signed)
Next Visit: Due March 2024. Message sent to the front to schedule.   Last Visit: 06/23/2022  Last Fill: 04/27/2022  OE:[id number] arthritis involving both hands with positive rheumatoid factor   Current Dose per office note 06/23/2022: Enbrel 50 mg subcu weekly   Labs: 06/23/2022 Lab results look fine for continuing Enbrel CBC and CMP are entirely normal.   TB Gold: 09/01/2021 Neg    Okay to refill Enbrel?

## 2022-07-21 NOTE — Telephone Encounter (Signed)
Patient states she will call back and schedule follow-up in June.  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

## 2022-07-31 ENCOUNTER — Other Ambulatory Visit: Payer: Self-pay | Admitting: Orthopedic Surgery

## 2022-07-31 DIAGNOSIS — M1710 Unilateral primary osteoarthritis, unspecified knee: Secondary | ICD-10-CM

## 2022-08-17 ENCOUNTER — Other Ambulatory Visit (HOSPITAL_COMMUNITY)
Admission: RE | Admit: 2022-08-17 | Payer: BC Managed Care – PPO | Source: Ambulatory Visit | Admitting: Physician Assistant

## 2022-08-17 ENCOUNTER — Ambulatory Visit (HOSPITAL_COMMUNITY)
Admission: RE | Admit: 2022-08-17 | Discharge: 2022-08-17 | Disposition: A | Discharge status: Home / Self Care | Payer: BC Managed Care – PPO | Source: Ambulatory Visit | Attending: Pulmonary Disease | Admitting: Pulmonary Disease

## 2022-08-25 ENCOUNTER — Ambulatory Visit (INDEPENDENT_AMBULATORY_CARE_PROVIDER_SITE_OTHER): Payer: BC Managed Care – PPO | Admitting: Family Medicine

## 2022-08-25 ENCOUNTER — Encounter: Payer: Self-pay | Admitting: Family Medicine

## 2022-08-25 VITALS — BP 140/86 | HR 75 | Resp 16 | Ht 66.0 in | Wt 218.0 lb

## 2022-08-25 DIAGNOSIS — Z1231 Encounter for screening mammogram for malignant neoplasm of breast: Secondary | ICD-10-CM | POA: Diagnosis not present

## 2022-08-25 DIAGNOSIS — M05742 Rheumatoid arthritis with rheumatoid factor of left hand without organ or systems involvement: Secondary | ICD-10-CM

## 2022-08-25 DIAGNOSIS — I10 Essential (primary) hypertension: Secondary | ICD-10-CM | POA: Diagnosis not present

## 2022-08-25 DIAGNOSIS — R32 Unspecified urinary incontinence: Secondary | ICD-10-CM | POA: Diagnosis not present

## 2022-08-25 DIAGNOSIS — E559 Vitamin D deficiency, unspecified: Secondary | ICD-10-CM

## 2022-08-25 DIAGNOSIS — M05741 Rheumatoid arthritis with rheumatoid factor of right hand without organ or systems involvement: Secondary | ICD-10-CM

## 2022-08-25 MED ORDER — AMLODIPINE BESYLATE 2.5 MG PO TABS: 2.5000 mg | ORAL_TABLET | Freq: Every day | ORAL | 3 refills | Status: DC

## 2022-08-25 MED ORDER — PHENTERMINE HCL 37.5 MG PO TABS: ORAL_TABLET | ORAL | 1 refills | Status: DC

## 2022-08-25 MED ORDER — MIRABEGRON ER 25 MG PO TB24: 25.0000 mg | ORAL_TABLET | Freq: Every day | ORAL | 3 refills | Status: DC

## 2022-08-25 NOTE — Patient Instructions (Signed)
Follow-up to reevaluate blood pressure and weight in 3 months, call if you need me sooner.  New additional medication for blood pressure amlodipine 2.5 mg daily.  Take this along with the amlodipine 5 mg daily so that your total daily dose of amlodipine is 7.5 mg every day.  You may take the 2 pills together.  Your blood pressure is still above goal.  New medication to help with incontinence is sent in which will have less side effects than oxybutynin please verify with your pharmacy that this will be filled.  Continue half of a phentermine tablet daily.  Work on giving yourself more time to take care of yourself by cutting work hours as we discussed.  Please start an exercise commitment of 30 minutes 5 days/week.  Please commit to drinking 64 ounces of water daily.  Please schedule mammogram due in July at checkout.  Thanks for choosing [facility], we consider it a privelige to serve you.

## 2022-08-30 ENCOUNTER — Encounter: Payer: Self-pay | Admitting: Family Medicine

## 2022-08-30 NOTE — Assessment & Plan Note (Signed)
Continue weekly supplement

## 2022-08-30 NOTE — Assessment & Plan Note (Signed)
Managed by Rheumatology no recent pain flares

## 2022-08-30 NOTE — Assessment & Plan Note (Signed)
Change med to one with less s/e of dry mouth

## 2022-08-30 NOTE — Assessment & Plan Note (Signed)
Not at goal, in amlodipine to 7.5 mg daily DASH diet and commitment to daily physical activity for a minimum of 30 minutes discussed and encouraged, as a part of hypertension management. The importance of attaining a healthy weight is also discussed.     08/25/2022    9:13 AM 08/25/2022    8:56 AM 08/25/2022    8:52 AM 07/14/2022   11:53 AM 07/06/2022   10:49 AM 06/23/2022    3:47 PM 05/13/2022    9:25 AM  BP/Weight  Systolic BP [id number] 0000000 0000000 [id number]  Diastolic BP 86 88 92 72 61 83 72  Wt. (Lbs)   218 218.8 218.04 217   BMI   35.19 kg/m2 35.32 kg/m2 35.19 kg/m2 35.02 kg/m2

## 2022-08-30 NOTE — Assessment & Plan Note (Signed)
  Patient re-educated about  the importance of commitment to a  minimum of 150 minutes of exercise per week as able.  The importance of healthy food choices with portion control discussed, as well as eating regularly and within a 12 hour window most days. The need to choose "clean , green" food 50 to 75% of the time is discussed, as well as to make water the primary drink and set a goal of 64 ounces water daily.       08/25/2022    8:52 AM 07/14/2022   11:53 AM 07/06/2022   10:49 AM  Weight /BMI  Weight 218 lb 218 lb 12.8 oz 218 lb 0.6 oz  Height 5' 6"$  (1.676 m) 5' 6"$  (1.676 m) 5' 6"$  (1.676 m)  BMI 35.19 kg/m2 35.32 kg/m2 35.19 kg/m2    Unchanged, notexercising as planned, works extra hours , thinking of and needs t cut back

## 2022-08-30 NOTE — Progress Notes (Signed)
Julie Ryan     MRN: [id number]      DOB: Jun 30, 1966   HPI Julie Ryan is here for follow up and re-evaluation of chronic medical conditions, medication management and review of any available recent lab and radiology data.  Preventive health is updated, specifically  Cancer screening and Immunization.   Questions or concerns regarding consultations or procedures which the PT has had in the interim are  addressed. The PT denies any adverse reactions to current medications since the last visit.  C/o excess work hours, does 2 jobs, no time or energy to [employer] and at times food chice is poor ROS Denies recent fever or chills. Denies sinus pressure, nasal congestion, ear pain or sore throat. Denies chest congestion, productive cough or wheezing. Denies chest pains, palpitations and leg swelling Denies abdominal pain, nausea, vomiting,diarrhea or constipation.   Denies dysuria, frequency, hesitancy or incontinence. Denies  uncontrolleed joint pain, swelling and limitation in mobility. Denies headaches, seizures, numbness, or tingling. Denies depression, anxiety or insomnia. Denies skin break down or rash.   PE  BP (!) 140/86   Pulse 75   Resp 16   Ht '5\' 6"'$  (1.676 m)   Wt 218 lb (98.9 kg)   SpO2 95%   BMI 35.19 kg/m   Patient alert and oriented and in no cardiopulmonary distress.  HEENT: No facial asymmetry, EOMI,     Neck supple .  Chest: Clear to auscultation bilaterally.  CVS: S1, S2 no murmurs, no S3.Regular rate.  ABD: Soft non tender.   Ext: No edema  MS: Adequate ROM spine, shoulders, hips and knees.  Skin: Intact, no ulcerations or rash noted.  Psych: Good eye contact, normal affect. Memory intact not anxious or depressed appearing.  CNS: CN 2-12 intact, power,  normal throughout.no focal deficits noted.   Assessment & Plan  Essential hypertension Not at goal, in amlodipine to 7.5 mg daily DASH diet and commitment to daily physical activity for a minimum  of 30 minutes discussed and encouraged, as a part of hypertension management. The importance of attaining a healthy weight is also discussed.     08/25/2022    9:13 AM 08/25/2022    8:56 AM 08/25/2022    8:52 AM 07/14/2022   11:53 AM 07/06/2022   10:49 AM 06/23/2022    3:47 PM 05/13/2022    9:25 AM  BP/Weight  Systolic BP [id number] 0000000 0000000 [id number] [id number] [id number]  Diastolic BP 86 88 92 72 61 83 72  Wt. (Lbs)   218 218.8 218.04 217   BMI   35.19 kg/m2 35.32 kg/m2 35.19 kg/m2 35.02 kg/m2        Urinary incontinence Change med to one with less s/e of dry mouth   MORBID OBESITY  Patient re-educated about  the importance of commitment to a  minimum of 150 minutes of exercise per week as able.  The importance of healthy food choices with portion control discussed, as well as eating regularly and within a 12 hour window most days. The need to choose "clean , green" food 50 to 75% of the time is discussed, as well as to make water the primary drink and set a goal of 64 ounces water daily.       08/25/2022    8:52 AM 07/14/2022   11:53 AM 07/06/2022   10:49 AM  Weight /BMI  Weight 218 lb 218 lb 12.8 oz 218 lb 0.6 oz  Height '5\' 6"'$  (1.676 m) '5\' 6"'$  (1.676  m) '5\' 6"'$  (1.676 m)  BMI 35.19 kg/m2 35.32 kg/m2 35.19 kg/m2    Unchanged, notexercising as planned, works extra hours , thinking of and needs t cut back  Vitamin D deficiency Continue weekly supplement  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 factor ([place]) Managed by Rheumatology no recent pain flares

## 2022-09-01 ENCOUNTER — Other Ambulatory Visit: Payer: Self-pay

## 2022-09-01 MED ORDER — MIRABEGRON ER 25 MG PO TB24: 25.0000 mg | ORAL_TABLET | Freq: Every day | ORAL | 3 refills | Status: DC

## 2022-09-02 ENCOUNTER — Encounter: Payer: Self-pay | Admitting: Radiology

## 2022-09-03 IMAGING — US US EXTREM LOW VENOUS*R*
1 series · 13 of 24 positions shown · non-contrast
Comparison: None.

CLINICAL DATA: Right lower extremity pain and edema.



[Series 1: us venous img lower uni right (dvt) · portal-venous · 13 of 35 slices shown]
[im 1/35]
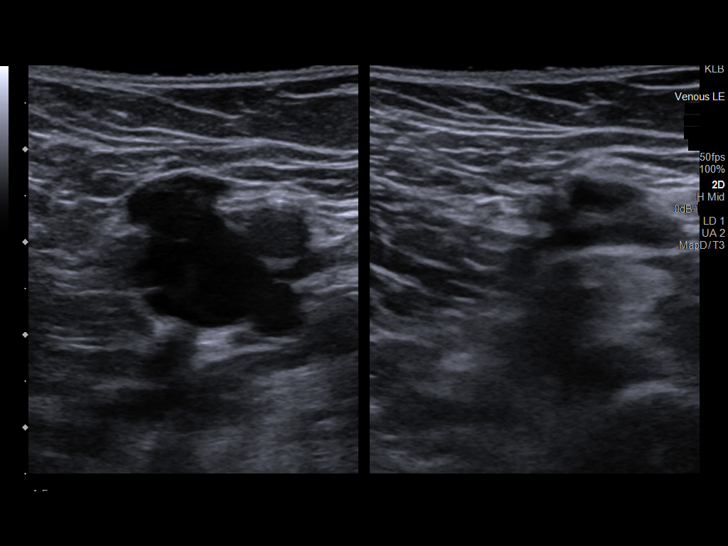
[im 3/35]
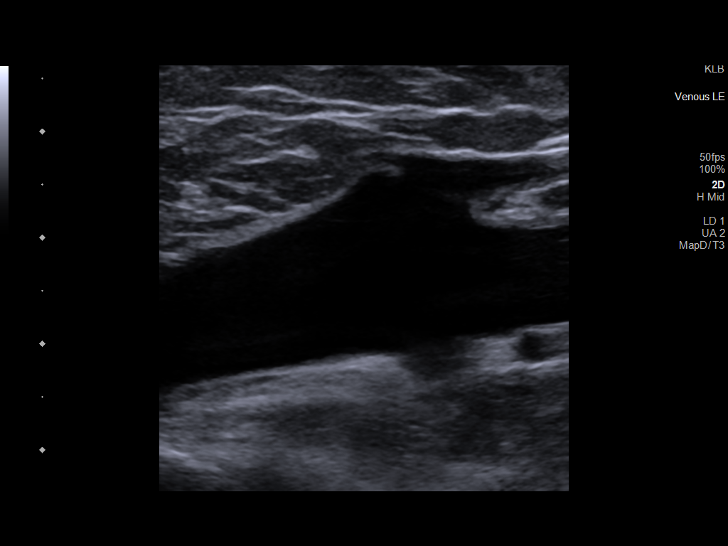
[im 6/35]
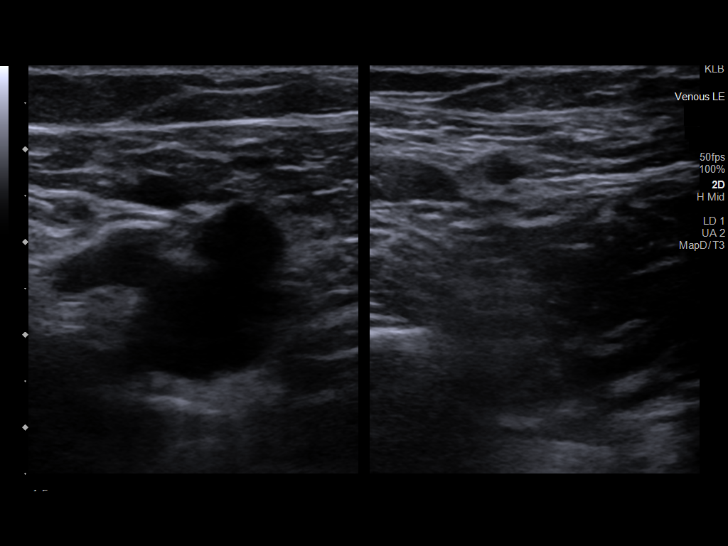
[im 9/35]
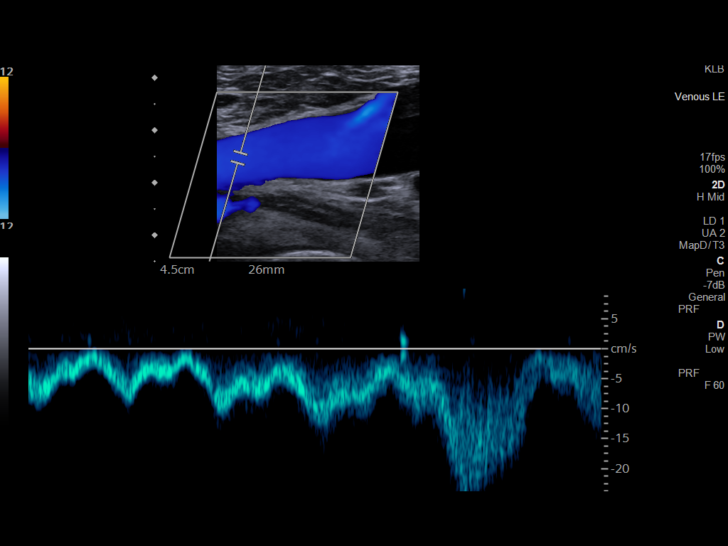
[im 12/35]
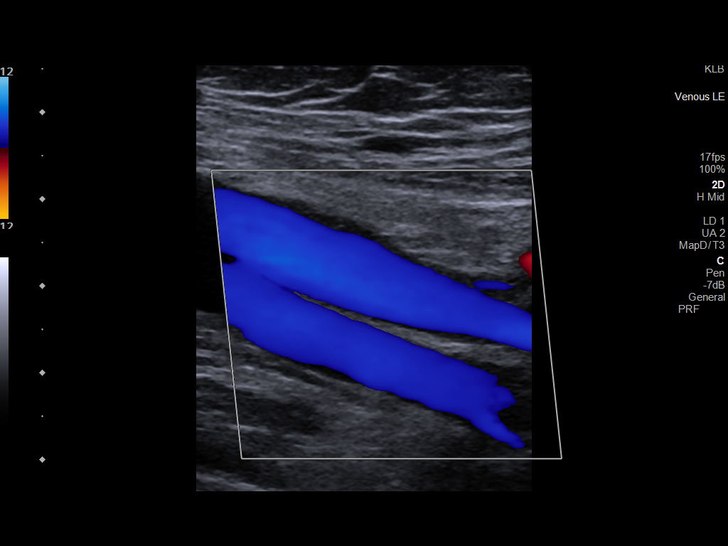
[im 15/35]
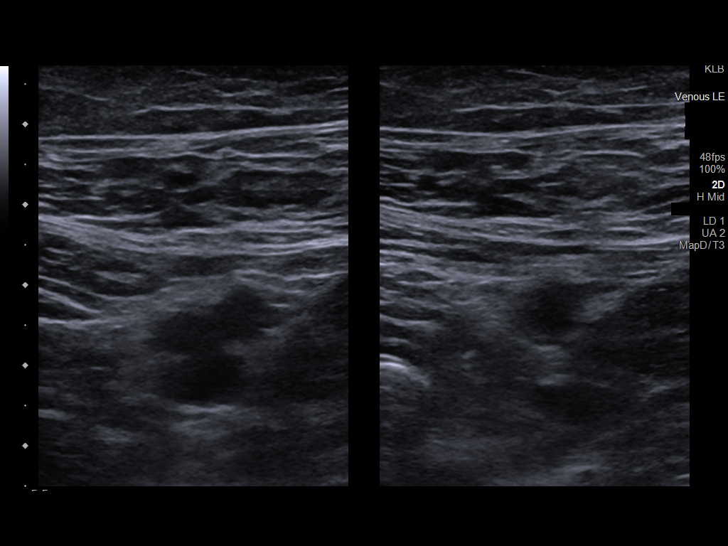
[im 18/35]
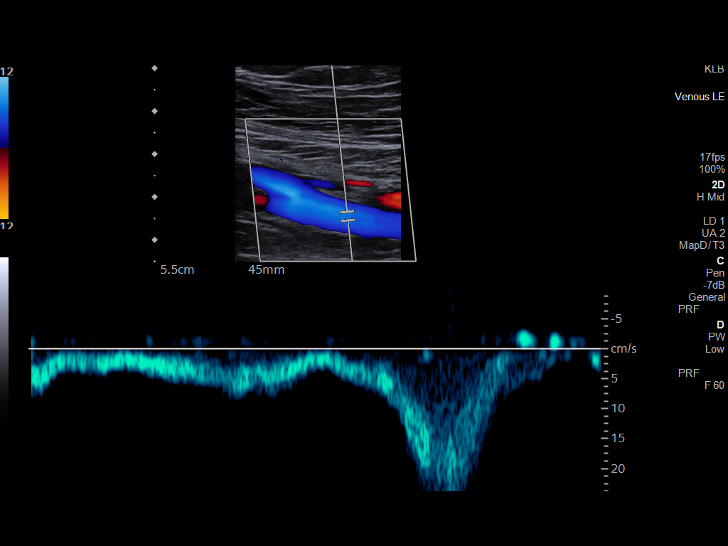
[im 20/35]
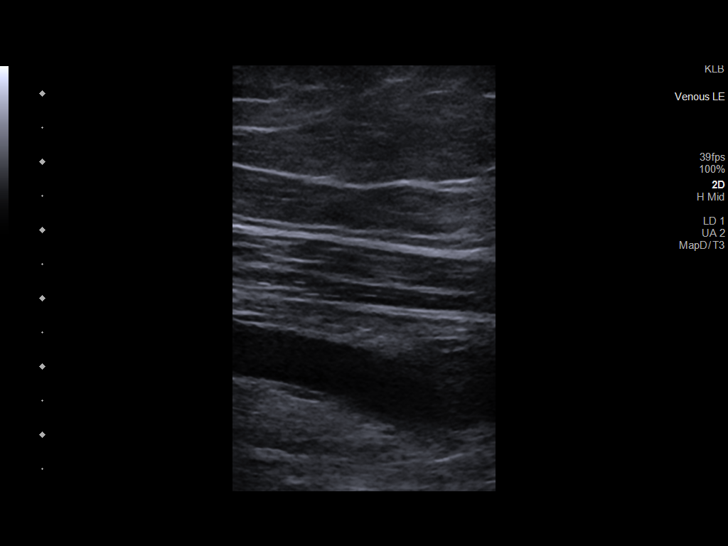
[im 23/35]
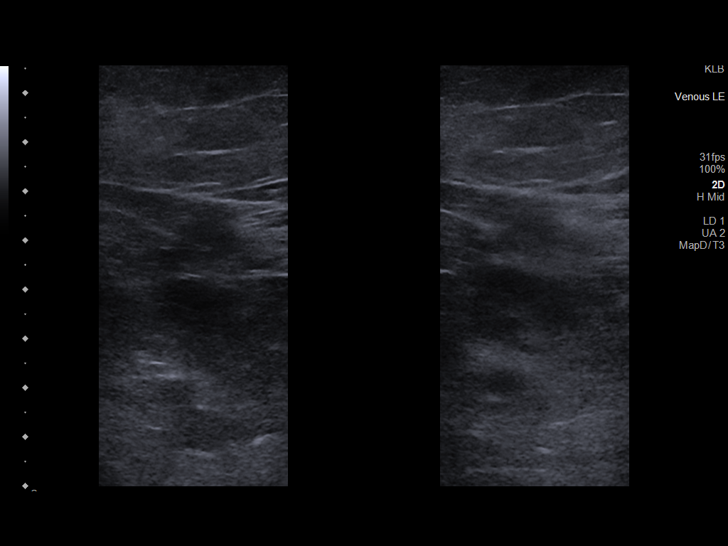
[im 26/35]
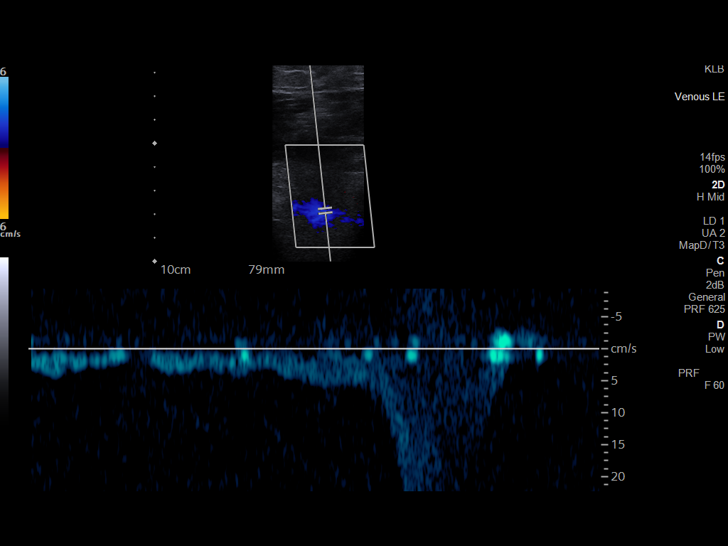
[im 29/35]
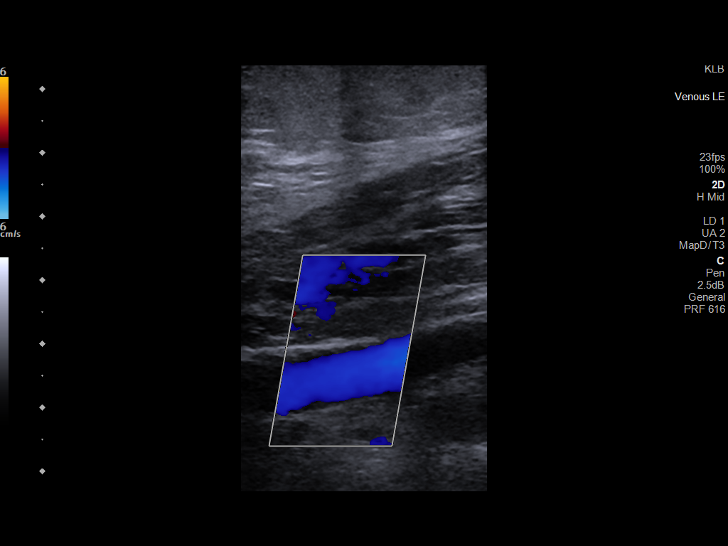
[im 32/35]
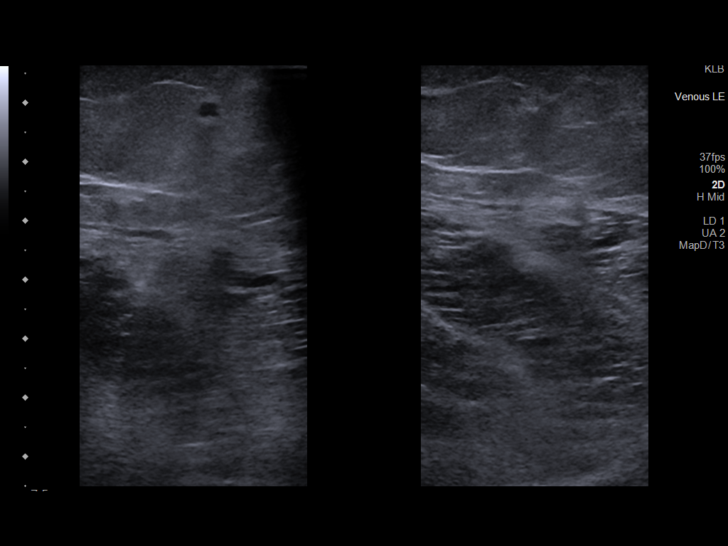
[im 35/35]
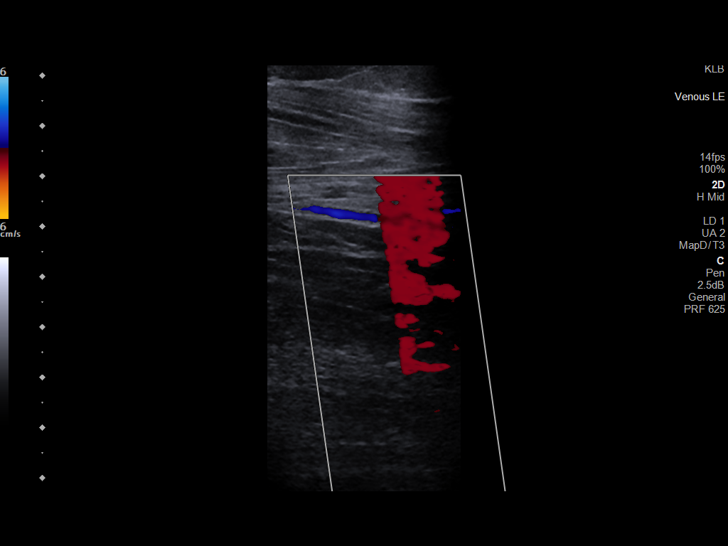

[13 of 24 positions shown; findings below may reference images not displayed]

FINDINGS: Contralateral Common Femoral Vein: Respiratory phasicity is normal
and symmetric with the symptomatic side. No evidence of thrombus.
Normal compressibility.

Common Femoral Vein: No evidence of thrombus. Normal
compressibility, respiratory phasicity and response to augmentation.

Saphenofemoral Junction: No evidence of thrombus. Normal
compressibility and flow on color Doppler imaging.

Profunda Femoral Vein: No evidence of thrombus. Normal
compressibility and flow on color Doppler imaging.

Femoral Vein: No evidence of thrombus. Normal compressibility,
respiratory phasicity and response to augmentation.

Popliteal Vein: No evidence of thrombus. Normal compressibility,
respiratory phasicity and response to augmentation.

Calf Veins: No evidence of thrombus. Normal compressibility and flow
on color Doppler imaging.

Superficial Great Saphenous Vein: No evidence of thrombus. Normal
compressibility.

Venous Reflux:  None.

Other Findings: No evidence of superficial thrombophlebitis or
abnormal fluid collection.
IMPRESSION: No evidence of right lower extremity deep venous thrombosis.

## 2022-09-08 ENCOUNTER — Other Ambulatory Visit: Payer: Self-pay

## 2022-09-08 MED ORDER — MIRABEGRON ER 25 MG PO TB24: 25.0000 mg | ORAL_TABLET | Freq: Every day | ORAL | 3 refills | Status: DC

## 2022-09-08 NOTE — Telephone Encounter (Signed)
Rx resent and requested pa be started on cover my meds

## 2022-09-13 ENCOUNTER — Encounter: Payer: Self-pay | Admitting: Orthopedic Surgery

## 2022-09-15 ENCOUNTER — Other Ambulatory Visit: Payer: Self-pay

## 2022-09-15 MED ORDER — OXYBUTYNIN CHLORIDE ER 10 MG PO TB24: 10.0000 mg | ORAL_TABLET | Freq: Every day | ORAL | 3 refills | Status: DC

## 2022-09-16 ENCOUNTER — Other Ambulatory Visit: Payer: Self-pay | Admitting: Family Medicine

## 2022-09-30 ENCOUNTER — Other Ambulatory Visit (INDEPENDENT_AMBULATORY_CARE_PROVIDER_SITE_OTHER): Payer: BC Managed Care – PPO

## 2022-09-30 ENCOUNTER — Ambulatory Visit: Payer: BC Managed Care – PPO | Admitting: Orthopedic Surgery

## 2022-09-30 DIAGNOSIS — M1712 Unilateral primary osteoarthritis, left knee: Secondary | ICD-10-CM

## 2022-09-30 DIAGNOSIS — M25562 Pain in left knee: Secondary | ICD-10-CM

## 2022-09-30 DIAGNOSIS — G8929 Other chronic pain: Secondary | ICD-10-CM

## 2022-09-30 NOTE — Progress Notes (Signed)
Chief Complaint  Patient presents with   Follow-up    Recheck on left knee    HPI: 57 year old female with chronic rheumatoid arthritis of the right and left knee comes in today with worsening left knee pain she says none of the over-the-counter medications or prescription medications have worked.  She did try some liniment with frankincense and murmur which she says seems to be helping her bend her knee and relieve some of the pain  We discussed that the knee is at the end of the road it needs to be replaced  We do have BMI and diabetic concerns we need to get a hemoglobin A1c and make sure her BMI is amenable to surgery but she basically needs her left knee replaced  Past Medical History:  Diagnosis Date   Angio-edema    Anxiety    Arthritis    Phreesia 08/19/2020   Carpal tunnel syndrome of right wrist    Chronic knee pain    Depression    Diabetes mellitus without complication [facility])    Endometrial polyp 08/09/2017   will get appt with Dr Elonda Husky   Fibroids 08/09/2017   Has multiple small fibroids    GERD (gastroesophageal reflux disease)    Hypertension    Neuropathy    Obesity    Prediabetes    Seizures ([place])    1 seizure as a chold; unknown etiology and has never been on meds    There were no vitals taken for this visit.   General appearance: Well-developed well-nourished no gross deformities   Psychological: Awake alert and oriented x3 mood and affect normal  Skin no lacerations or ulcerations no nodularity no palpable masses, no erythema or nodularity  Musculoskeletal: She can swing the left knee well she is ambulatory without assistive device  Imaging repeated her imaging and she has a grade 4 varus moderate amount of varus in the left knee  A/P  Left knee osteoarthritis  The patient would be a good candidate for hyaluronic acid gel injections as she has failed Enbrel treatment anti-inflammatory treatment topical and oral and injection

## 2022-09-30 NOTE — Patient Instructions (Signed)
We will check which brand of Hyaluronic acid injections (there are several) your insurance covers. We will call you with price and schedule with you if insurance approves and you are okay with the out of pocket costs. If for any reason they will not cover or the out of pocket costs are high, we will discuss with you and let you know other options available. This process normally takes several weeks to hear back from [place] the insurance approval process takes time.

## 2022-10-07 ENCOUNTER — Telehealth: Payer: Self-pay

## 2022-10-07 NOTE — Telephone Encounter (Signed)
-----   Message from Candice Camp, RT sent at 09/30/2022  5:05 PM EDT ----- Needs hyaluronic acid injections of her left knee please

## 2022-10-07 NOTE — Telephone Encounter (Signed)
VOB submitted for Monovisc, left knee 

## 2022-10-13 ENCOUNTER — Telehealth: Payer: Self-pay | Admitting: Pharmacist

## 2022-10-13 NOTE — Telephone Encounter (Signed)
Received notification from [facility] regarding a prior authorization for ENBREL. Authorization has been APPROVED from 10/12/22 to 10/12/23. Approval letter sent to scan center.  Patient must continue to fill through [facility]: [phone]  Authorization # [id number]  Chesley Mires, PharmD, MPH, BCPS, CPP Clinical Pharmacist (Rheumatology and Pulmonology)

## 2022-10-21 ENCOUNTER — Other Ambulatory Visit: Payer: Self-pay | Admitting: Internal Medicine

## 2022-10-21 DIAGNOSIS — M05741 Rheumatoid arthritis with rheumatoid factor of right hand without organ or systems involvement: Secondary | ICD-10-CM

## 2022-10-21 NOTE — Telephone Encounter (Signed)
Last Fill: 07/21/2022  Labs: 06/23/2022 Lab results look fine for continuing Enbrel CBC and CMP are entirely normal. Sed rate is increased at 46 this is most likely from residual inflammation after the recent pneumonia.   TB Gold: 09/01/2021 Negative   Next Visit: 12/13/2022  Last Visit: 06/23/2022  XL:[id number] arthritis involving both hands with positive rheumatoid factor   Current Dose per office note 06/23/2022: Enbrel 50 mg subcu weekly.   Attempted to contact the patient and left a message that her labs are due.   Okay to refill Enbrel?

## 2022-10-28 ENCOUNTER — Telehealth: Payer: Self-pay

## 2022-10-28 NOTE — Telephone Encounter (Signed)
PA pending for Orthovisc, left knee through Covermymeds. Key: [id number]

## 2022-11-01 ENCOUNTER — Telehealth: Payer: Self-pay

## 2022-11-01 NOTE — Telephone Encounter (Signed)
Please schedule patient for gel injection with Dr. Romeo Apple.  All information has been added under the referrals tab.

## 2022-11-02 ENCOUNTER — Telehealth: Payer: Self-pay | Admitting: Orthopedic Surgery

## 2022-11-02 ENCOUNTER — Other Ambulatory Visit: Payer: Self-pay | Admitting: Orthopedic Surgery

## 2022-11-02 ENCOUNTER — Encounter: Payer: Self-pay | Admitting: Orthopedic Surgery

## 2022-11-02 DIAGNOSIS — M1710 Unilateral primary osteoarthritis, unspecified knee: Secondary | ICD-10-CM

## 2022-11-02 MED ORDER — DICLOFENAC SODIUM 75 MG PO TBEC: DELAYED_RELEASE_TABLET | ORAL | 2 refills | Status: DC

## 2022-11-02 NOTE — Telephone Encounter (Signed)
Patient called and wants to know the status on the Hyaluronic acid injections.  She was told someone would call her back, she was last seen in the office on 09/30/22 and no one has called her back to let her know where things stand.  Please call her back at [phone]

## 2022-11-02 NOTE — Telephone Encounter (Signed)
A message was sent yesterday to  Jackquline Bosch., and [facility] to schedule patient for gel injection.

## 2022-11-02 NOTE — Telephone Encounter (Signed)
Noted  

## 2022-11-11 ENCOUNTER — Other Ambulatory Visit: Payer: Self-pay | Admitting: Family Medicine

## 2022-11-14 ENCOUNTER — Other Ambulatory Visit: Payer: Self-pay | Admitting: Family Medicine

## 2022-11-15 ENCOUNTER — Encounter: Payer: Self-pay | Admitting: Internal Medicine

## 2022-11-15 ENCOUNTER — Other Ambulatory Visit: Payer: Self-pay | Admitting: *Deleted

## 2022-11-15 DIAGNOSIS — M05741 Rheumatoid arthritis with rheumatoid factor of right hand without organ or systems involvement: Secondary | ICD-10-CM

## 2022-11-15 DIAGNOSIS — Z79899 Other long term (current) drug therapy: Secondary | ICD-10-CM

## 2022-11-15 NOTE — Telephone Encounter (Signed)
I called patient, patient given phone # to call Enbrel for new device, labs released.

## 2022-11-18 ENCOUNTER — Telehealth: Payer: Self-pay | Admitting: Internal Medicine

## 2022-11-18 NOTE — Telephone Encounter (Signed)
Called [facility] to provide verbal rx for replacement of Enbrel Autotouch device  Reference #[id number] Phone: [phone], option 1, option 1  Chesley Mires, PharmD, MPH, BCPS, CPP Clinical Pharmacist (Rheumatology and Pulmonology)

## 2022-11-18 NOTE — Telephone Encounter (Signed)
[place] from [facility] left a voicemail stating the patient reported to the manufacturer that they have a damaged or defective Enbrel auto touch device.  The manufacturer would like to replace it at no cost to the patient. Please call with verbal approval. Reference #[id number] Phone #[phone] Please select [employer] replacements to reach our department.   We also faxed a replacement form if you don't want to call with verbal.  Fax #[phone]

## 2022-11-19 ENCOUNTER — Ambulatory Visit: Payer: BC Managed Care – PPO | Admitting: Orthopedic Surgery

## 2022-11-19 DIAGNOSIS — M1710 Unilateral primary osteoarthritis, unspecified knee: Secondary | ICD-10-CM

## 2022-11-19 DIAGNOSIS — M1712 Unilateral primary osteoarthritis, left knee: Secondary | ICD-10-CM | POA: Diagnosis not present

## 2022-11-19 DIAGNOSIS — G8929 Other chronic pain: Secondary | ICD-10-CM

## 2022-11-19 MED ORDER — HYALURONAN 30 MG/2ML IX SOSY
30.0000 mg | PREFILLED_SYRINGE | Freq: Once | INTRA_ARTICULAR | Status: AC
  Administered 2022-11-19: 30 mg via INTRA_ARTICULAR

## 2022-11-19 MED ORDER — HYALURONAN 30 MG/2ML IX SOSY
30.0000 mg | PREFILLED_SYRINGE | Freq: Once | INTRA_ARTICULAR | Status: AC
  Administered 2022-11-26: 30 mg via INTRA_ARTICULAR

## 2022-11-19 MED ORDER — HYALURONAN 30 MG/2ML IX SOSY
30.0000 mg | PREFILLED_SYRINGE | Freq: Once | INTRA_ARTICULAR | Status: AC
  Administered 2022-12-03: 30 mg via INTRA_ARTICULAR

## 2022-11-19 NOTE — Progress Notes (Addendum)
Chief Complaint  Patient presents with   Injections    Left knee Orthovisc 1    Encounter Diagnoses  Name Primary?   Chronic pain of left knee Yes   Primary localized osteoarthritis of knee    The patient has consented to and requested hyaluronic acid injection in the form of  Orthovisc 1/3   The left knee is prepped with alcohol and ethyl chloride  The injection is performed with a 21-gauge needle, via inferolateral approach  No complications were noted  Appropriate instructions post injection were given   Return 1 week

## 2022-11-22 ENCOUNTER — Encounter: Payer: Self-pay | Admitting: Orthopedic Surgery

## 2022-11-26 ENCOUNTER — Telehealth: Payer: Self-pay | Admitting: Orthopedic Surgery

## 2022-11-26 ENCOUNTER — Ambulatory Visit: Payer: BC Managed Care – PPO | Admitting: Orthopedic Surgery

## 2022-11-26 DIAGNOSIS — M1710 Unilateral primary osteoarthritis, unspecified knee: Secondary | ICD-10-CM

## 2022-11-26 DIAGNOSIS — M1712 Unilateral primary osteoarthritis, left knee: Secondary | ICD-10-CM | POA: Diagnosis not present

## 2022-11-26 DIAGNOSIS — G8929 Other chronic pain: Secondary | ICD-10-CM

## 2022-11-26 NOTE — Telephone Encounter (Signed)
Patient wants xrays on disk as far back as we can go I told her we can call her when its ready

## 2022-11-26 NOTE — Progress Notes (Signed)
Chief Complaint  Patient presents with   Injections    Left knee #2 Orthovisc    Encounter Diagnoses  Name Primary?   Chronic pain of left knee Yes   Primary localized osteoarthritis of knee      Medication   ORTHOVISC  Injx #  2/3  The patient has consented to and requested hyaluronic acid injection   The left  knee is prepped with alcohol and ethyl chloride  The injection is performed with a 21-gauge needle, via inferolateral approach  No complications were noted  Appropriate instructions post injection were given  Fu 1 week

## 2022-12-03 ENCOUNTER — Ambulatory Visit (INDEPENDENT_AMBULATORY_CARE_PROVIDER_SITE_OTHER): Payer: BC Managed Care – PPO | Admitting: Orthopedic Surgery

## 2022-12-03 DIAGNOSIS — M1712 Unilateral primary osteoarthritis, left knee: Secondary | ICD-10-CM

## 2022-12-03 DIAGNOSIS — M1710 Unilateral primary osteoarthritis, unspecified knee: Secondary | ICD-10-CM

## 2022-12-03 DIAGNOSIS — G8929 Other chronic pain: Secondary | ICD-10-CM

## 2022-12-03 NOTE — Progress Notes (Signed)
Chief Complaint  Patient presents with   Knee Pain    Orthovisc injection left knee    Encounter Diagnoses  Name Primary?   Chronic pain of left knee    Primary localized osteoarthritis of knee Yes    The patient has consented to and requested hyaluronic acid injection   MEDICATION: orthovisc  left  THE knee is prepped with alcohol and ethyl chloride  The injection is performed with a 21-gauge needle, via inferolateral approach  No complications were noted  Appropriate instructions post injection were given   prn

## 2022-12-12 NOTE — Progress Notes (Unsigned)
Office Visit Note  Patient: Julie Ryan             Date of Birth: 1966/06/01           MRN: [id number]             PCP: Kerri Perches, MD Referring: Kerri Perches, MD Visit Date: 12/13/2022   Subjective:  No chief complaint on file.   History of Present Illness: Julie Ryan is a 57 y.o. female here for follow up ***   Previous HPI 06/23/22  Julie Ryan is a 56 y.o. female here for follow up for seropositive RA on Enbrel 50 mg [place] weekly.  Since her last visit arthritis symptoms have been pretty stable still gets some significant knee pain worse with any prolonged walking and standing.  She went to the emergency department last month due to development of chest wall pain and fevers and was found to have multifocal pneumonia.  She completed antibiotics treatment for this and symptoms have mostly resolved though she still has a little bit of worsening shortness of breath and some anterior chest wall pain.  She is continued her Enbrel treatment consistently throughout the whole time.   Previous HPI 12/10/21 Julie Ryan is a 57 y.o. female here for follow up for seropositive RA on Enbrel 50 mg [place] weekly.  She is feeling well today. About 3 weeks ago saw Dr. Romeo Apple for bilateral knee steroid injections due to increased pain and some swelling in both knees for a week. Last week developed upper back pain only partially helped with flexeril and was prescribed medrol pack by Dr. Allena Katz last dose tomorrow, and symptoms improved. She notices knee pain sometimes after Enbrel injection if she does it during the day, okay at night.   Previous HPI 09/01/2021 Julie Ryan is a 57 y.o. female here for follow up for seropositive RA on Enbrel 50 mg [place] weekly. She is doing well overall and symptoms are controlled. She still has bilateral knee pain and stiffness. Morning stiffness about 20 minutes duration. 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 Pain is worse  with rainy weather and cold. She takes tylenol 2-3 times per day for the knee pain. Not much associated swelling that she has noticed. Knee will catch or pop partway through movement about twice per week, this has never caused her to fall.   Previous HPI 06/02/21 Julie Ryan is a 57 y.o. female here for follow up for seropositive rheumatoid arthritis on Enbrel 50 mg subcu weekly.  Most of her symptoms are doing well but continues having considerable pain in bilateral knees.  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  Has not been much associated swelling.  Symptoms elsewhere are doing very well.  She has not had any major infection or other side effects.   Previous HPI: Julie Ryan is a 57 y.o. female here for follow up for seropositive rheumatoid arthritis.  She previously started subcutaneous methotrexate 15 mg weekly and had described a improvement in her joint pain swelling and stiffness especially in the bilateral hands but this medicine was stopped due to a considerable rise in transaminase levels on repeat labs.  She has been off any medicine for over 2 weeks now and she does feel like her joint pain and stiffness is getting worse  again.   No Rheumatology ROS completed.   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   Back spasm 04/12/2018   Endometrial polyp 08/09/2017   Fibroids 08/09/2017    Vaginal dryness 07/18/2017   Leg edema 03/08/2017   GAD (generalized anxiety disorder) 06/01/2016   Metabolic syndrome X 07/08/2013   Erosive esophagitis 06/25/2013   GERD (gastroesophageal reflux disease) 03/22/2013   Left knee pain 03/22/2013   Snoring 08/05/2011   Urinary incontinence 08/05/2011   Vitamin D deficiency 03/21/2011   Allergic rhinitis 09/20/2010   Back pain 03/17/2010   MORBID OBESITY 02/11/2010   Essential hypertension 02/11/2010    Past Medical History:  Diagnosis Date   Angio-edema    Anxiety    Arthritis    Phreesia 08/19/2020   Carpal tunnel syndrome of right wrist    Chronic knee pain    Depression    Diabetes mellitus without complication (HCC)    Endometrial polyp 08/09/2017   will get appt with Dr Despina Hidden   Fibroids 08/09/2017   Has multiple small fibroids    GERD (gastroesophageal reflux disease)    Hypertension    Neuropathy    Obesity    Prediabetes    Seizures (HCC)    1 seizure as a chold; unknown etiology and has never been on meds    Family History  Problem Relation Age of Onset   Hypertension Mother    Heart disease Mother    Diabetes Mother    Diabetes Sister    Multiple sclerosis Sister    Lupus Sister    Heart disease Sister    CVA Sister    Depression Sister    Diabetes Brother    Hypertension Brother    Other Son        brain tumor   Hypertension Daughter    Diabetes Sister    Past Surgical History:  Procedure Laterality Date   APPENDECTOMY     CARPAL TUNNEL RELEASE Right 08/01/2015   Procedure: RIGHT CARPAL TUNNEL RELEASE;  Surgeon: Vickki Hearing, MD;  Location: [facility];  Service: Orthopedics;  Laterality: Right;   CERVICAL POLYPECTOMY  09/14/2017   Procedure: ENDOMETRIAL POLYPECTOMY;  Surgeon: Lazaro Arms, MD;  Location: [facility];  Service: Gynecology;;   COLONOSCOPY N/A 06/15/2017   Procedure: COLONOSCOPY;  Surgeon: Malissa Hippo, MD;  Location: [facility];  Service: Endoscopy;  Laterality: N/A;  830    ENDOMETRIAL ABLATION N/A 09/14/2017   Procedure: ENDOMETRIAL ABLATION( [place]);  Surgeon: Lazaro Arms, MD;  Location: [facility];  Service: Gynecology;  Laterality: N/A;   ESOPHAGOGASTRODUODENOSCOPY N/A 05/25/2013   Procedure: ESOPHAGOGASTRODUODENOSCOPY (EGD);  Surgeon: Malissa Hippo, MD;  Location: [facility];  Service: Endoscopy;  Laterality: N/A;  220   GASTRIC BYPASS     HYSTEROSCOPY WITH D & C N/A 09/14/2017   Procedure: DILATATION AND CURETTAGE /HYSTEROSCOPY;  Surgeon: Lazaro Arms, MD;  Location: [facility];  Service: Gynecology;  Laterality: N/A;   TUBAL LIGATION     Social History   Social History  Narrative   Not on file   Immunization History  Administered Date(s) Administered   Influenza Whole 03/18/2011   Influenza,inj,Quad PF,6+ Mos 03/22/2013, 07/31/2015, 06/01/2016, 02/26/2017, 04/12/2018, 05/07/2019, 06/26/2020, 05/13/2021   Influenza-Unspecified 02/26/2017   Moderna Sars-Covid-2 Vaccination 12/05/2019, 01/02/2020, 08/01/2020   PPD Test 07/04/2013, 07/26/2013, 03/15/2016, 03/02/2017, 03/21/2018, 12/17/2019   Td 06/24/2010   Tdap 08/20/2020   Zoster Recombinat (Shingrix) 02/07/2020, 05/01/2020     Objective: Vital Signs: There were no vitals taken for this visit.   Physical Exam   Musculoskeletal Exam: ***  CDAI Exam: CDAI Score: -- Patient Global: --; Provider Global: -- Swollen: --; Tender: -- Joint Exam 12/13/2022   No joint exam has been documented for this visit   There is currently no information documented on the homunculus. Go to the Rheumatology activity and complete the homunculus joint exam.  Investigation: No additional findings.  Imaging: No results found.  Recent Labs: Lab Results  Component Value Date   WBC 6.8 06/23/2022   HGB 12.1 06/23/2022   PLT 331 06/23/2022   NA 140 07/06/2022   K 4.8 07/06/2022   CL 102 07/06/2022   CO2 25 07/06/2022   GLUCOSE 91 07/06/2022   BUN 14 07/06/2022   CREATININE 0.75 07/06/2022   BILITOT  0.8 06/23/2022   ALKPHOS 115 08/26/2021   AST 16 06/23/2022   ALT 14 06/23/2022   PROT 6.8 06/23/2022   ALBUMIN 4.4 08/26/2021   CALCIUM 9.2 07/06/2022   GFRAA 76 01/07/2021   QFTBGOLDPLUS NEGATIVE 09/01/2021    Speciality Comments: No specialty comments available.  Procedures:  No procedures performed Allergies: Codeine, Effexor xr [venlafaxine hcl er], Fluoxetine, Hydrocodone, Methotrexate derivatives, and Latex   Assessment / Plan:     Visit Diagnoses: No diagnosis found.  ***  Orders: No orders of the defined types were placed in this encounter.  No orders of the defined types were placed in this encounter.    Follow-Up Instructions: No follow-ups on file.   Fuller Plan, MD  Note - This record has been created using [employer].  Chart creation errors have been sought, but may not always  have been located. Such creation errors do not reflect on  the standard of medical care.

## 2022-12-13 ENCOUNTER — Encounter: Payer: Self-pay | Admitting: Internal Medicine

## 2022-12-13 ENCOUNTER — Ambulatory Visit: Payer: BC Managed Care – PPO | Attending: Internal Medicine | Admitting: Internal Medicine

## 2022-12-13 VITALS — BP 117/64 | HR 57 | Resp 14 | Ht 66.0 in | Wt 216.0 lb

## 2022-12-13 DIAGNOSIS — M05741 Rheumatoid arthritis with rheumatoid factor of right hand without organ or systems involvement: Secondary | ICD-10-CM | POA: Diagnosis not present

## 2022-12-13 DIAGNOSIS — M05742 Rheumatoid arthritis with rheumatoid factor of left hand without organ or systems involvement: Secondary | ICD-10-CM

## 2022-12-13 DIAGNOSIS — Z79899 Other long term (current) drug therapy: Secondary | ICD-10-CM

## 2022-12-13 DIAGNOSIS — M170 Bilateral primary osteoarthritis of knee: Secondary | ICD-10-CM | POA: Diagnosis not present

## 2022-12-13 LAB — CBC WITH DIFFERENTIAL/PLATELET
Hemoglobin: 13.8 g/dL (ref 11.7–15.5)
MPV: 10.1 fL (ref 7.5–12.5)
Monocytes Relative: 5.8 %
RDW: 13 % (ref 11.0–15.0)
Total Lymphocyte: 36.2 %

## 2022-12-14 LAB — CBC WITH DIFFERENTIAL/PLATELET
HCT: 42.2 % (ref 35.0–45.0)
MCH: 30.7 pg (ref 27.0–33.0)
MCV: 93.8 fL (ref 80.0–100.0)
RBC: 4.5 10*6/uL (ref 3.80–5.10)

## 2022-12-14 LAB — COMPLETE METABOLIC PANEL WITH GFR
ALT: 13 U/L (ref 6–29)
Total Bilirubin: 0.8 mg/dL (ref 0.2–1.2)

## 2022-12-14 NOTE — Progress Notes (Signed)
Sedimentation rate improved to 28 now which is normal. Blood count and metabolic panel are normal. No medication change needed.

## 2022-12-15 ENCOUNTER — Other Ambulatory Visit: Payer: Self-pay | Admitting: Family Medicine

## 2022-12-15 LAB — CBC WITH DIFFERENTIAL/PLATELET
Absolute Monocytes: 493 cells/uL (ref 200–950)
Basophils Absolute: 43 cells/uL (ref 0–200)
Basophils Relative: 0.5 %
Eosinophils Absolute: 153 cells/uL (ref 15–500)
Eosinophils Relative: 1.8 %
Lymphs Abs: 3077 cells/uL (ref 850–3900)
MCHC: 32.7 g/dL (ref 32.0–36.0)
Neutro Abs: 4735 cells/uL (ref 1500–7800)
Neutrophils Relative %: 55.7 %
Platelets: 315 10*3/uL (ref 140–400)
WBC: 8.5 10*3/uL (ref 3.8–10.8)

## 2022-12-15 LAB — COMPLETE METABOLIC PANEL WITH GFR
AG Ratio: 1.4 (calc) (ref 1.0–2.5)
AST: 17 U/L (ref 10–35)
Albumin: 4.4 g/dL (ref 3.6–5.1)
Alkaline phosphatase (APISO): 101 U/L (ref 37–153)
BUN: 20 mg/dL (ref 7–25)
CO2: 31 mmol/L (ref 20–32)
Calcium: 9.8 mg/dL (ref 8.6–10.4)
Chloride: 101 mmol/L (ref 98–110)
Creat: 0.9 mg/dL (ref 0.50–1.03)
Globulin: 3.1 g/dL (calc) (ref 1.9–3.7)
Glucose, Bld: 91 mg/dL (ref 65–99)
Potassium: 4.8 mmol/L (ref 3.5–5.3)
Sodium: 139 mmol/L (ref 135–146)
Total Protein: 7.5 g/dL (ref 6.1–8.1)
eGFR: 74 mL/min/{1.73_m2} (ref >=60)

## 2022-12-15 LAB — QUANTIFERON-TB GOLD PLUS
Mitogen-NIL: 8.6 IU/mL
NIL: 0.04 IU/mL
QuantiFERON-TB Gold Plus: NEGATIVE
TB1-NIL: 0 IU/mL
TB2-NIL: <0 IU/mL

## 2022-12-15 LAB — SEDIMENTATION RATE: Sed Rate: 28 mm/h (ref 0–30)

## 2022-12-16 ENCOUNTER — Encounter: Payer: Self-pay | Admitting: Family Medicine

## 2022-12-16 ENCOUNTER — Ambulatory Visit (INDEPENDENT_AMBULATORY_CARE_PROVIDER_SITE_OTHER): Payer: BC Managed Care – PPO | Admitting: Family Medicine

## 2022-12-16 VITALS — BP 121/72 | HR 71 | Ht 66.0 in | Wt 218.0 lb

## 2022-12-16 DIAGNOSIS — Z1211 Encounter for screening for malignant neoplasm of colon: Secondary | ICD-10-CM

## 2022-12-16 DIAGNOSIS — Z13220 Encounter for screening for lipoid disorders: Secondary | ICD-10-CM

## 2022-12-16 DIAGNOSIS — M05742 Rheumatoid arthritis with rheumatoid factor of left hand without organ or systems involvement: Secondary | ICD-10-CM

## 2022-12-16 DIAGNOSIS — Z124 Encounter for screening for malignant neoplasm of cervix: Secondary | ICD-10-CM

## 2022-12-16 DIAGNOSIS — I10 Essential (primary) hypertension: Secondary | ICD-10-CM | POA: Diagnosis not present

## 2022-12-16 DIAGNOSIS — M05741 Rheumatoid arthritis with rheumatoid factor of right hand without organ or systems involvement: Secondary | ICD-10-CM | POA: Diagnosis not present

## 2022-12-16 DIAGNOSIS — E538 Deficiency of other specified B group vitamins: Secondary | ICD-10-CM

## 2022-12-16 DIAGNOSIS — D511 Vitamin B12 deficiency anemia due to selective vitamin B12 malabsorption with proteinuria: Secondary | ICD-10-CM

## 2022-12-16 DIAGNOSIS — R32 Unspecified urinary incontinence: Secondary | ICD-10-CM

## 2022-12-16 DIAGNOSIS — E559 Vitamin D deficiency, unspecified: Secondary | ICD-10-CM

## 2022-12-16 DIAGNOSIS — E785 Hyperlipidemia, unspecified: Secondary | ICD-10-CM

## 2022-12-16 MED ORDER — AMLODIPINE BESYLATE 5 MG PO TABS: ORAL_TABLET | ORAL | 3 refills | Status: DC

## 2022-12-16 MED ORDER — POTASSIUM CHLORIDE CRYS ER 20 MEQ PO TBCR: EXTENDED_RELEASE_TABLET | ORAL | 1 refills | Status: DC

## 2022-12-16 MED ORDER — FUROSEMIDE 20 MG PO TABS: ORAL_TABLET | ORAL | 1 refills | Status: DC

## 2022-12-16 MED ORDER — PHENTERMINE HCL 37.5 MG PO TABS: ORAL_TABLET | ORAL | 2 refills | Status: DC

## 2022-12-16 MED ORDER — AMLODIPINE BESYLATE 2.5 MG PO TABS: 2.5000 mg | ORAL_TABLET | Freq: Every day | ORAL | 3 refills | Status: DC

## 2022-12-16 MED ORDER — SOLIFENACIN SUCCINATE 5 MG PO TABS
5.0000 mg | ORAL_TABLET | Freq: Every day | ORAL | 5 refills | Status: DC
Start: 2022-12-16 — End: 2023-04-25

## 2022-12-16 NOTE — Patient Instructions (Addendum)
F/U in 11 weeks, call if you need me sooner  Weight loss goal of 6 to 10 pounds  Increase phentermine tab to 1 daily and STOP sweets and chips  Ensure 6 to 8 hours of sleep and 30 mins exercise daily  TSH, lipids, B12, vit D today  Nurse pls re order cologuard test  Please schedule your pap due in August 9 nurse please refer to [facility] for pelvic and pap due 02/2023)  Thanks for choosing [facility], we consider it a privelige to serve you.

## 2022-12-17 LAB — LIPID PANEL
Chol/HDL Ratio: 3.3 ratio (ref 0.0–4.4)
Cholesterol, Total: 190 mg/dL (ref 100–199)
HDL: 57 mg/dL (ref >39)
LDL Chol Calc (NIH): 117 mg/dL — ABNORMAL HIGH (ref 0–99)
Triglycerides: 88 mg/dL (ref 0–149)
VLDL Cholesterol Cal: 16 mg/dL (ref 5–40)

## 2022-12-17 LAB — VITAMIN D 25 HYDROXY (VIT D DEFICIENCY, FRACTURES): Vit D, 25-Hydroxy: 43.8 ng/mL (ref 30.0–100.0)

## 2022-12-17 LAB — VITAMIN B12: Vitamin B-12: 286 pg/mL (ref 232–1245)

## 2022-12-17 LAB — TSH: TSH: 1.43 u[IU]/mL (ref 0.450–4.500)

## 2022-12-22 DIAGNOSIS — E538 Deficiency of other specified B group vitamins: Secondary | ICD-10-CM | POA: Insufficient documentation

## 2022-12-22 MED ORDER — GABAPENTIN 300 MG PO CAPS: ORAL_CAPSULE | ORAL | 2 refills | Status: DC

## 2022-12-22 NOTE — Assessment & Plan Note (Signed)
Hyperlipidemia:Low fat diet discussed and encouraged.   Lipid Panel  Lab Results  Component Value Date   CHOL 190 12/16/2022   HDL 57 12/16/2022   LDLCALC 117 (H) 12/16/2022   TRIG 88 12/16/2022   CHOLHDL 3.3 12/16/2022     Needs to reduce fried and fatty foods

## 2022-12-22 NOTE — Assessment & Plan Note (Addendum)
Increase phntermine o 37.5 mg daily and reduce sweets and starches  Patient re-educated about  the importance of commitment to a  minimum of 150 minutes of exercise per week as able.  The importance of healthy food choices with portion control discussed, as well as eating regularly and within a 12 hour window most days. The need to choose "clean , green" food 50 to 75% of the time is discussed, as well as to make water the primary drink and set a goal of 64 ounces water daily.       12/16/2022    8:02 AM 12/13/2022   11:01 AM 08/25/2022    8:52 AM  Weight /BMI  Weight 218 lb 0.6 oz 216 lb 218 lb  Height 5\' 6"  (1.676 m) 5\' 6"  (1.676 m) 5\' 6"  (1.676 m)  BMI 35.19 kg/m2 34.86 kg/m2 35.19 kg/m2

## 2022-12-22 NOTE — Assessment & Plan Note (Signed)
Updated lab needed at/ before next visit.   

## 2022-12-22 NOTE — Assessment & Plan Note (Signed)
Controlled, no change in medication  

## 2022-12-22 NOTE — Assessment & Plan Note (Signed)
Controlled, no change in medication DASH diet and commitment to daily physical activity for a minimum of 30 minutes discussed and encouraged, as a part of hypertension management. The importance of attaining a healthy weight is also discussed.     12/16/2022    8:02 AM 12/13/2022   11:01 AM 08/25/2022    9:13 AM 08/25/2022    8:56 AM 08/25/2022    8:52 AM 07/14/2022   11:53 AM 07/06/2022   10:49 AM  BP/Weight  Systolic BP 121 117 140 140 162 128 116  Diastolic BP 72 64 86 88 92 72 61  Wt. (Lbs) 218.04 216   218 218.8 218.04  BMI 35.19 kg/m2 34.86 kg/m2   35.19 kg/m2 35.32 kg/m2 35.19 kg/m2

## 2022-12-22 NOTE — Assessment & Plan Note (Signed)
Managed by reumatology, improvement in pain and function

## 2022-12-22 NOTE — Progress Notes (Signed)
Julie Ryan     MRN: [id number]      DOB: 26-Jan-1966  Chief Complaint  Patient presents with   Follow-up    Follow up    HPI Julie Ryan is here for follow up and re-evaluation of chronic medical conditions, medication management and review of any available recent lab and radiology data.  Preventive health is updated, specifically  Cancer screening and Immunization.   ssed. The PT denies any adverse reactions to current medications since the last visit.  C/o insufficient weight loss , appetite needs to be more curbed ROS Denies recent fever or chills. Denies sinus pressure, nasal congestion, ear pain or sore throat. Denies chest congestion, productive cough or wheezing. Denies chest pains, palpitations and leg swelling Denies abdominal pain, nausea, vomiting,diarrhea or constipation.   Denies dysuria, frequency, hesitancy or incontinence. Denies uncontrolled joint pain, swelling and limitation in mobility. Denies headaches, seizures, numbness, or tingling. Denies depression, anxiety or insomnia. Denies skin break down or rash.   PE  BP 121/72 (BP Location: Right Arm, Patient Position: Sitting, Cuff Size: Large)   Pulse 71   Ht 5\' 6"  (1.676 m)   Wt 218 lb 0.6 oz (98.9 kg)   SpO2 93%   BMI 35.19 kg/m   Patient alert and oriented and in no cardiopulmonary distress.  HEENT: No facial asymmetry, EOMI,     Neck supple .  Chest: Clear to auscultation bilaterally.  CVS: S1, S2 no murmurs, no S3.Regular rate.  ABD: Soft non tender.   Ext: No edema  MS: Adequate ROM spine, shoulders, hips and knees.  Skin: Intact, no ulcerations or rash noted.  Psych: Good eye contact, normal affect. Memory intact not anxious or depressed appearing.  CNS: CN 2-12 intact, power,  normal throughout.no focal deficits noted.   Assessment & Plan  Urinary incontinence Controlled, no change in medication   MORBID OBESITY Increase phntermine o 37.5 mg daily and reduce sweets and  starches  Patient re-educated about  the importance of commitment to a  minimum of 150 minutes of exercise per week as able.  The importance of healthy food choices with portion control discussed, as well as eating regularly and within a 12 hour window most days. The need to choose "clean , green" food 50 to 75% of the time is discussed, as well as to make water the primary drink and set a goal of 64 ounces water daily.       12/16/2022    8:02 AM 12/13/2022   11:01 AM 08/25/2022    8:52 AM  Weight /BMI  Weight 218 lb 0.6 oz 216 lb 218 lb  Height 5\' 6"  (1.676 m) 5\' 6"  (1.676 m) 5\' 6"  (1.676 m)  BMI 35.19 kg/m2 34.86 kg/m2 35.19 kg/m2      Essential hypertension Controlled, no change in medication DASH diet and commitment to daily physical activity for a minimum of 30 minutes discussed and encouraged, as a part of hypertension management. The importance of attaining a healthy weight is also discussed.     12/16/2022    8:02 AM 12/13/2022   11:01 AM 08/25/2022    9:13 AM 08/25/2022    8:56 AM 08/25/2022    8:52 AM 07/14/2022   11:53 AM 07/06/2022   10:49 AM  BP/Weight  Systolic BP 121 117 140 140 162 128 116  Diastolic BP 72 64 86 88 92 72 61  Wt. 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

## 2022-12-30 ENCOUNTER — Other Ambulatory Visit: Payer: Self-pay | Admitting: Internal Medicine

## 2022-12-30 DIAGNOSIS — M05741 Rheumatoid arthritis with rheumatoid factor of right hand without organ or systems involvement: Secondary | ICD-10-CM

## 2022-12-30 NOTE — Telephone Encounter (Signed)
Last Fill: 10/22/2022  Labs: 12/13/2022 Sedimentation rate improved to 28 now which is normal. Blood count and metabolic panel are normal. No medication change needed.   TB Gold: 12/13/2022  Negative  Next Visit: 06/21/2023  Last Visit: 12/13/2022  [id number] arthritis involving both hands with positive rheumatoid factor   Current Dose per office note 12/13/2022: Enbrel 50 mg [place] weekly   Okay to refill Enbrel?

## 2022-12-31 ENCOUNTER — Other Ambulatory Visit: Payer: Self-pay | Admitting: Family Medicine

## 2023-01-04 ENCOUNTER — Ambulatory Visit (HOSPITAL_COMMUNITY)
Admission: RE | Admit: 2023-01-04 | Discharge: 2023-01-04 | Disposition: A | Discharge status: Home / Self Care | Payer: BC Managed Care – PPO | Source: Ambulatory Visit | Attending: Pulmonary Disease | Admitting: Pulmonary Disease

## 2023-01-04 DIAGNOSIS — R0609 Other forms of dyspnea: Secondary | ICD-10-CM | POA: Diagnosis present

## 2023-01-04 LAB — PULMONARY FUNCTION TEST
DL/VA % pred: 142 %
DL/VA: 5.95 ml/min/mmHg/L
DLCO unc % pred: 75 %
DLCO unc: 16.21 ml/min/mmHg
FEF 25-75 Post: 0.97 L/sec
FEF 25-75 Pre: 1.68 L/sec
FEF2575-%Change-Post: -42 %
FEF2575-%Pred-Post: 38 %
FEF2575-%Pred-Pre: 66 %
FEV1-%Change-Post: -12 %
FEV1-%Pred-Post: 49 %
FEV1-%Pred-Pre: 56 %
FEV1-Post: 1.37 L
FEV1-Pre: 1.57 L
FEV1FVC-%Change-Post: -3 %
FEV1FVC-%Pred-Pre: 104 %
FEV6-%Change-Post: -6 %
FEV6-%Pred-Post: 50 %
FEV6-%Pred-Pre: 53 %
FEV6-Post: 1.74 L
FEV6-Pre: 1.87 L
FEV6FVC-%Change-Post: 2 %
FEV6FVC-%Pred-Post: 103 %
FEV6FVC-%Pred-Pre: 101 %
FVC-%Change-Post: -8 %
FVC-%Pred-Post: 48 %
FVC-%Pred-Pre: 53 %
FVC-Post: 1.74 L
FVC-Pre: 1.91 L
Post FEV1/FVC ratio: 79 %
Post FEV6/FVC ratio: 100 %
Pre FEV1/FVC ratio: 82 %
Pre FEV6/FVC Ratio: 98 %
RV % pred: 75 %
RV: 1.56 L
TLC % pred: 64 %
TLC: 3.48 L

## 2023-01-04 MED ORDER — ALBUTEROL SULFATE (2.5 MG/3ML) 0.083% IN NEBU
2.5000 mg | INHALATION_SOLUTION | Freq: Once | RESPIRATORY_TRACT | Status: AC
  Administered 2023-01-04: 2.5 mg via RESPIRATORY_TRACT

## 2023-01-05 ENCOUNTER — Ambulatory Visit (INDEPENDENT_AMBULATORY_CARE_PROVIDER_SITE_OTHER): Payer: BC Managed Care – PPO | Admitting: Adult Health

## 2023-01-05 ENCOUNTER — Encounter: Payer: Self-pay | Admitting: Adult Health

## 2023-01-05 ENCOUNTER — Other Ambulatory Visit (HOSPITAL_COMMUNITY)
Admission: RE | Admit: 2023-01-05 | Discharge: 2023-01-05 | Disposition: A | Discharge status: Home / Self Care | Payer: BC Managed Care – PPO | Source: Ambulatory Visit | Attending: Adult Health | Admitting: Adult Health

## 2023-01-05 VITALS — BP 136/73 | HR 71 | Ht 66.0 in | Wt 214.0 lb

## 2023-01-05 DIAGNOSIS — Z1211 Encounter for screening for malignant neoplasm of colon: Secondary | ICD-10-CM | POA: Diagnosis not present

## 2023-01-05 DIAGNOSIS — Z01419 Encounter for gynecological examination (general) (routine) without abnormal findings: Secondary | ICD-10-CM | POA: Insufficient documentation

## 2023-01-05 LAB — HEMOCCULT GUIAC POC 1CARD (OFFICE): Fecal Occult Blood, POC: NEGATIVE

## 2023-01-05 NOTE — Progress Notes (Signed)
Patient ID: Julie Ryan, female   DOB: 09-Nov-1965, 57 y.o.   MRN: [id number] History of Present Illness: Julie Ryan is a 57 year old black female,single, PM in for a well woman gyn exam and pap.  PCP is Dr Lodema Hong   Current Medications, Allergies, Past Medical History, Past Surgical History, Family History and Social History were reviewed in [employer] electronic medical record.     Review of Systems: Patient denies any headaches, hearing loss, fatigue, blurred vision, shortness of breath, chest pain, abdominal pain, problems with bowel movements, urination, or intercourse. No mood swings.  She has knee pain, and needs surgery. 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  The vagina is normal in appearance. Urethra has no lesions or masses. The cervix is bulbous,pap with HR HPV genotyping performed.  Uterus is felt to be normal size, shape, and contour.  No adnexal masses or tenderness noted.Bladder is non tender, no masses felt. Rectal: Good sphincter tone, no polyps, or hemorrhoids felt.  Hemoccult negative. Extremities/musculoskeletal:  No swelling or varicosities noted, no clubbing or cyanosis Psych:  No mood changes, alert and cooperative,seems happy AA is 1 Fall risk is low    01/05/2023    2:26 PM 12/16/2022    8:03 AM 08/25/2022    8:58 AM  Depression screen PHQ 2/9   Decreased Interest 3 3 0  Down, Depressed, Hopeless 0 1 0  PHQ - 2 Score 3 4 0  Altered sleeping 2 0   Tired, decreased energy 3 3   Change in appetite 0 0   Feeling bad or failure about yourself  0 0   Trouble concentrating 0 0   Moving slowly or fidgety/restless 0 0   Suicidal thoughts 0 0   PHQ-9 Score 8 7   Difficult doing work/chores  Somewhat difficult    Has just had son's death anniversary, declines meds     01/05/2023    2:26 PM 12/16/2022    8:03 AM 07/06/2022   10:57 AM 08/14/2021   10:57 AM  GAD 7 : Generalized Anxiety Score  Nervous, Anxious, on Edge 0 0 0 1  Control/stop worrying 0 0 0 3  Worry too much - different things 0 0 3 3  Trouble relaxing 0 0 0 0  Restless 0 0 0 0  Easily annoyed or irritable 0 0 2 2  Afraid - awful might happen 0 0 2 0  Total GAD 7 Score 0 0 7 9  Anxiety Difficulty   Very difficult       Upstream - 01/05/23 1422       Pregnancy Intention Screening   Does the patient want to become pregnant in the next year? No    Does the patient's partner want to become pregnant in the next year? No    Would the patient like to discuss contraceptive options today?  No      Contraception Wrap Up   Current Method Female Sterilization    End Method Female Sterilization    Contraception Counseling Provided No            Examination chaperoned by Malachy Mood LPN  Impression and Plan: 1. Encounter for gynecological examination with Papanicolaou smear of cervix Pap sent Pap in 3 years if normal Physical in 1 year with PCP Labs with PCP Mammogram scheduled for July 18/24 Colonoscopy per GI  - Cytology - PAP( [place])  2. Encounter for screening fecal occult blood testing Hemoccult was negative  - POCT occult blood stool

## 2023-01-10 ENCOUNTER — Telehealth: Payer: Self-pay | Admitting: *Deleted

## 2023-01-10 ENCOUNTER — Other Ambulatory Visit: Payer: Self-pay | Admitting: Adult Health

## 2023-01-10 LAB — CYTOLOGY - PAP
Adequacy: ABSENT
Comment: NEGATIVE
Diagnosis: NEGATIVE
High risk HPV: NEGATIVE

## 2023-01-10 MED ORDER — METRONIDAZOLE 500 MG PO TABS: 500.0000 mg | ORAL_TABLET | Freq: Two times a day (BID) | ORAL | 0 refills | Status: DC

## 2023-01-10 NOTE — Telephone Encounter (Signed)
-----   Message from Adline Potter, NP sent at 01/10/2023  4:27 PM EDT ----- Let her know pap +BV and rx sent no sex or alcohol while taking. Was negative HPV and malignancy.Marland Kitchen  THX

## 2023-01-10 NOTE — Telephone Encounter (Signed)
Pt aware pap was negative for malignancy & HPV. Was + for BV. Med was sent to pharmacy. No sex or alcohol while taking med. Pt voiced understanding. JSY

## 2023-01-20 ENCOUNTER — Ambulatory Visit (HOSPITAL_COMMUNITY)
Admission: RE | Admit: 2023-01-20 | Discharge: 2023-01-20 | Disposition: A | Discharge status: Home / Self Care | Payer: BC Managed Care – PPO | Source: Ambulatory Visit | Attending: Family Medicine | Admitting: Family Medicine

## 2023-01-20 ENCOUNTER — Encounter (HOSPITAL_COMMUNITY): Payer: Self-pay

## 2023-01-20 DIAGNOSIS — Z1231 Encounter for screening mammogram for malignant neoplasm of breast: Secondary | ICD-10-CM | POA: Diagnosis present

## 2023-01-30 ENCOUNTER — Other Ambulatory Visit: Payer: Self-pay | Admitting: Orthopedic Surgery

## 2023-01-30 DIAGNOSIS — M1710 Unilateral primary osteoarthritis, unspecified knee: Secondary | ICD-10-CM

## 2023-02-14 ENCOUNTER — Telehealth: Payer: Self-pay

## 2023-02-14 NOTE — Telephone Encounter (Signed)
Patient contacted the office stating that her Enbrel Auto touch died and that she needs a replacement. Patient states she has already contacted [facility] and that we need to call them now. Contacted [facility] and spoke to [place] who transferred me to a Pharmacist named Yolanda. Gave a verbal authorization to replace the patient's enbrel auto touch device and Yolanda verbalized understanding.

## 2023-03-03 ENCOUNTER — Ambulatory Visit: Payer: BC Managed Care – PPO | Admitting: Family Medicine

## 2023-03-03 VITALS — BP 130/82 | HR 78 | Ht 66.0 in | Wt 214.1 lb

## 2023-03-03 DIAGNOSIS — Z23 Encounter for immunization: Secondary | ICD-10-CM | POA: Diagnosis not present

## 2023-03-03 DIAGNOSIS — M05742 Rheumatoid arthritis with rheumatoid factor of left hand without organ or systems involvement: Secondary | ICD-10-CM

## 2023-03-03 DIAGNOSIS — M05741 Rheumatoid arthritis with rheumatoid factor of right hand without organ or systems involvement: Secondary | ICD-10-CM

## 2023-03-03 DIAGNOSIS — I10 Essential (primary) hypertension: Secondary | ICD-10-CM | POA: Diagnosis not present

## 2023-03-03 DIAGNOSIS — E785 Hyperlipidemia, unspecified: Secondary | ICD-10-CM

## 2023-03-03 DIAGNOSIS — E559 Vitamin D deficiency, unspecified: Secondary | ICD-10-CM

## 2023-03-03 MED ORDER — PHENTERMINE HCL 37.5 MG PO TABS: ORAL_TABLET | ORAL | 3 refills | Status: DC

## 2023-03-03 NOTE — Progress Notes (Signed)
Julie Ryan     MRN: [id number]      DOB: December 28, 1965  Chief Complaint  Patient presents with   Follow-up    Follow up    HPI Julie Ryan is here for follow up and re-evaluation of chronic medical conditions, medication management and review of any available recent lab and radiology data.  Preventive health is updated, specifically  Cancer screening and Immunization.   Questions or concerns regarding consultations or procedures which the PT has had in the interim are  addressed. The PT denies any adverse reactions to current medications since the last visit.  There are no new concerns.  States remains very challenged as far as losing weight is concerned, and feels limited because she is unable to exercise ROS Denies recent fever or chills. Denies sinus pressure, nasal congestion, ear pain or sore throat. Denies chest congestion, productive cough or wheezing. Denies chest pains, palpitations and leg swelling Denies abdominal pain, nausea, vomiting,diarrhea or constipation.   Denies dysuria, frequency, hesitancy or incontinence. . Denies headaches, seizures, numbness, or tingling. Denies depression, anxiety or insomnia. Denies skin break down or rash.   PE  BP 130/82   Pulse 78   Ht 5\' 6"  (1.676 m)   Wt 214 lb 1.9 oz (97.1 kg)   SpO2 92%   BMI 34.56 kg/m   Patient alert and oriented and in no cardiopulmonary distress.  HEENT: No facial asymmetry, EOMI,     Neck supple .  Chest: Clear to auscultation bilaterally.  CVS: S1, S2 no murmurs, no S3.Regular rate.  ABD: Soft non tender.   Ext: No edema  MS: decreased  ROM spine, s and knees.  Skin: Intact, no ulcerations or rash noted.  Psych: Good eye contact, normal affect. Memory intact not anxious or depressed appearing.  CNS: CN 2-12 intact, power,  normal throughout.no focal deficits noted.   Assessment & Plan  Essential hypertension Controlled, no change in medication DASH diet and commitment to daily  physical activity for a minimum of 30 minutes discussed and encouraged, as a part of hypertension management. The importance of attaining a healthy weight is also discussed.     03/03/2023    8:39 AM 03/03/2023    8:16 AM 03/03/2023    8:13 AM 01/05/2023    2:17 PM 12/16/2022    8:02 AM 12/13/2022   11:01 AM 08/25/2022    9:13 AM  BP/Weight  Systolic BP 130 125 142 136 121 117 140  Diastolic BP 82 81 85 73 72 64 86  Wt. (Lbs)   214.12 214 218.04 216   BMI   34.56 kg/m2 34.54 kg/m2 35.19 kg/m2 34.86 kg/m2        MORBID OBESITY Minimal weight loss , continue same med dose but needs to challenge herself with food accountability, and stop snacking Patient re-educated about  the importance of commitment to a  minimum of 150 minutes of exercise per week as able.  The importance of healthy food choices with portion control discussed, as well as eating regularly and within a 12 hour window most days. The need to choose "clean , green" food 50 to 75% of the time is discussed, as well as to make water the primary drink and set a goal of 64 ounces water daily.       03/03/2023    8:13 AM 01/05/2023    2:17 PM 12/16/2022    8:02 AM  Weight /BMI  Weight 214 lb 1.9 oz 214 lb  218 lb 0.6 oz  Height 5\' 6"  (1.676 m) 5\' 6"  (1.676 m) 5\' 6"  (1.676 m)  BMI 34.56 kg/m2 34.54 kg/m2 35.19 kg/m2      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 factor (HCC) Adequate control of joint  pain, managed by rheumatology  Dyslipidemia Hyperlipidemia:Low fat diet discussed and encouraged.   Lipid Panel  Lab Results  Component Value Date   CHOL 190 12/16/2022   HDL 57 12/16/2022   LDLCALC 117 (H) 12/16/2022   TRIG 88 12/16/2022   CHOLHDL 3.3 12/16/2022     Needs to lower intake of fried and fatty fooods  Vitamin D deficiency Adequately corrected , continue current med regime

## 2023-03-03 NOTE — Assessment & Plan Note (Signed)
Adequately corrected , continue current med regime

## 2023-03-03 NOTE — Assessment & Plan Note (Addendum)
Minimal weight loss , continue same med dose but needs to challenge herself with food accountability, and stop snacking Patient re-educated about  the importance of commitment to a  minimum of 150 minutes of exercise per week as able.  The importance of healthy food choices with portion control discussed, as well as eating regularly and within a 12 hour window most days. The need to choose "clean , green" food 50 to 75% of the time is discussed, as well as to make water the primary drink and set a goal of 64 ounces water daily.       03/03/2023    8:13 AM 01/05/2023    2:17 PM 12/16/2022    8:02 AM  Weight /BMI  Weight 214 lb 1.9 oz 214 lb 218 lb 0.6 oz  Height 5\' 6"  (1.676 m) 5\' 6"  (1.676 m) 5\' 6"  (1.676 m)  BMI 34.56 kg/m2 34.54 kg/m2 35.19 kg/m2

## 2023-03-03 NOTE — Patient Instructions (Addendum)
F/u in 12 weeks, call if you need me sooner  No med change  Plan and take your food with you and be very aware of what you eat and when  No snacks!!  Weight  loss goal of 6 to 12 pounds  Flu vaccine today  Please reconsider  covid vaccine   Thanks for choosing [facility], we consider it a privelige to serve you.

## 2023-03-03 NOTE — Assessment & Plan Note (Signed)
Adequate control of joint  pain, managed by rheumatology

## 2023-03-03 NOTE — Assessment & Plan Note (Signed)
Controlled, no change in medication DASH diet and commitment to daily physical activity for a minimum of 30 minutes discussed and encouraged, as a part of hypertension management. The importance of attaining a healthy weight is also discussed.     03/03/2023    8:39 AM 03/03/2023    8:16 AM 03/03/2023    8:13 AM 01/05/2023    2:17 PM 12/16/2022    8:02 AM 12/13/2022   11:01 AM 08/25/2022    9:13 AM  BP/Weight  Systolic BP 130 125 142 136 121 117 140  Diastolic BP 82 81 85 73 72 64 86  Wt. (Lbs)   214.12 214 218.04 216   BMI   34.56 kg/m2 34.54 kg/m2 35.19 kg/m2 34.86 kg/m2

## 2023-03-03 NOTE — Assessment & Plan Note (Signed)
Hyperlipidemia:Low fat diet discussed and encouraged.   Lipid Panel  Lab Results  Component Value Date   CHOL 190 12/16/2022   HDL 57 12/16/2022   LDLCALC 117 (H) 12/16/2022   TRIG 88 12/16/2022   CHOLHDL 3.3 12/16/2022     Needs to lower intake of fried and fatty fooods

## 2023-03-28 ENCOUNTER — Ambulatory Visit: Payer: BC Managed Care – PPO | Admitting: Orthopedic Surgery

## 2023-03-28 ENCOUNTER — Encounter: Payer: Self-pay | Admitting: Orthopedic Surgery

## 2023-03-28 VITALS — BP 149/94 | HR 101 | Ht 66.0 in | Wt 214.0 lb

## 2023-03-28 DIAGNOSIS — M1712 Unilateral primary osteoarthritis, left knee: Secondary | ICD-10-CM | POA: Diagnosis not present

## 2023-03-28 DIAGNOSIS — Z01818 Encounter for other preprocedural examination: Secondary | ICD-10-CM

## 2023-03-28 NOTE — Progress Notes (Signed)
Follow-up worsening pain left knee  Encounter Diagnosis  Name Primary?   Primary osteoarthritis of left knee Yes      Julie Ryan is 57 years old she complains of worsening pain in her left knee  She is status post Roux-en-Y gastric bypass she has rheumatoid arthritis she is on phentermine and Enbrel  She has had hyaluronic acid injections as well as other treatments for her left knee pain without success  She indicates she wishes to proceed with left total  knee arthroplasty/  Findings include slight varus alignment to the left knee tenderness over the medial joint line flexion contracture less than 10 degrees knee flexion 105 degrees No instability Skin envelope looks normal  1 or 2 steps to get in the house, everything is on the same floor.  She has to be able to help her at home after surgery  Risks and benefits of surgery discussed including but not limited to potential risk of Stiffness because of her preop stiffness Deep vein thrombosis/DVT Infection  The procedure has been fully reviewed with the patient; The risks and benefits of surgery have been discussed and explained and understood. Alternative treatment has also been reviewed, questions were encouraged and answered. The postoperative plan is also been reviewed.  Problem list, medical hx, medications and allergies reviewed

## 2023-03-28 NOTE — Patient Instructions (Signed)
Before your surgery talk to your Rheumatologist (Dr Dimple Casey) to make sure you do not have special instructions regarding your Enbrel    Your surgery will be at [facility] by Dr Romeo Apple  plan to be in hospital overnight. The hospital will contact you with a preoperative appointment to discuss Anesthesia.  Please arrive on time or 15 minutes early for the preoperative appointment, they have a very tight schedule if you are late or do not come in your surgery will be cancelled.  The phone number for the preop area is [phone]. Please bring your medications with you for the appointment. They will tell you the arrival time for surgery and medication instructions when you have your preoperative evaluation. Do not wear nail polish the day of your surgery and if you take Phentermine you need to stop this medication ONE WEEK prior to your surgery. f you take Docia Barrier, Jardiance, or Steglatro) - Hold 72 hours before the procedure.  If you take Dwana Melena, [place] or Trulicity do not take for 8 days before your surgery. If you take Victoza, Rybelsis, Saxenda or Adlyxi stop 24 hours before the procedure. Please arrive at the hospital 2 hours before procedure if scheduled at 9:30 or later in the day or at the time the nurse tells you at your preoperative visit.   If you have my chart do not use the time given in my chart use the time given to you by the nurse during your preoperative visit.   Your surgery  time may change. Please be available for phone calls the day of your surgery and the day before. The Short Stay department may need to discuss changes about your surgery time. Not reaching the you could lead to procedure delays and possible cancellation.  You must have a ride home and someone to stay with you for 24 to 48 hours. The person taking you home will receive and sign for the your discharge instructions.  Please be prepared to give your support person's name and telephone number to  [employer]. Dr Romeo Apple will need that name and phone number post procedure.   You will also get a call from a representative of [employer], they have a machine that you will use in the first few weeks after surgery. It is called a CPM.   You will have home physical therapy for 2 weeks after surgery, the home health agency will call you before or just following the surgery to set up visits. [facility] is the agency we normally use, unless you request another agency.   You will get a call also from outpatient therapy for therapy starting when the home therapy is done.  If you have questions or need to Reschedule the surgery, call the office ask for Sayvon Arterberry.

## 2023-03-29 MED ORDER — BUPIVACAINE-MELOXICAM ER 400-12 MG/14ML IJ SOLN: 400.0000 mg | Freq: Once | INTRAMUSCULAR | Status: DC

## 2023-03-29 NOTE — Addendum Note (Signed)
Addended byCaffie Damme on: 03/29/2023 10:36 AM   Modules accepted: Orders

## 2023-03-30 ENCOUNTER — Encounter: Payer: Self-pay | Admitting: Orthopedic Surgery

## 2023-04-01 ENCOUNTER — Encounter: Payer: Self-pay | Admitting: Family Medicine

## 2023-04-04 ENCOUNTER — Telehealth (INDEPENDENT_AMBULATORY_CARE_PROVIDER_SITE_OTHER): Payer: BC Managed Care – PPO | Admitting: Internal Medicine

## 2023-04-04 ENCOUNTER — Encounter: Payer: Self-pay | Admitting: Internal Medicine

## 2023-04-04 DIAGNOSIS — U071 COVID-19: Secondary | ICD-10-CM | POA: Diagnosis not present

## 2023-04-04 MED ORDER — NIRMATRELVIR/RITONAVIR (PAXLOVID)TABLET
3.0000 | ORAL_TABLET | Freq: Two times a day (BID) | ORAL | 0 refills | Status: AC
Start: 2023-04-04 — End: 2023-04-09

## 2023-04-04 NOTE — Progress Notes (Signed)
Virtual Visit via Video Note   Because of Julie Ryan's co-morbid illnesses, she is at least at moderate risk for complications without adequate follow up.  This format is felt to be most appropriate for this patient at this time.  All issues noted in this document were discussed and addressed.  A limited physical exam was performed with this format.      Evaluation Performed:  Follow-up visit  Date:  04/04/2023   ID:  Julie Ryan, DOB [birth year], MRN [id number]  Patient Location: Home Provider Location: Office/Clinic  Participants: Patient Location of Patient: Home Location of Provider: Telehealth Consent was obtain for visit to be over via telehealth. I verified that I am speaking with the correct person using two identifiers.  PCP:  Kerri Perches, MD   Chief Complaint: Cough, headache and myalgias  History of Present Illness:    Julie Ryan is a 57 y.o. female who has a video visit for c/o cough, headache and myalgias for the last 3 days. She also has chills and nasal congestion. She tested positive for COVID at home.  Denies any dyspnea or wheezing currently.  The patient does have symptoms concerning for COVID-19 infection (fever, chills, cough, or new shortness of breath).   Past Medical, Surgical, Social History, Allergies, and Medications have been Reviewed.  Past Medical History:  Diagnosis Date   Angio-edema    Anxiety    Arthritis    Phreesia 08/19/2020   Carpal tunnel syndrome of right wrist    Chronic knee pain    Depression    Diabetes mellitus without complication [facility])    Endometrial polyp 08/09/2017   will get appt with Dr Despina Hidden   Fibroids 08/09/2017   Has multiple small fibroids    GERD (gastroesophageal reflux disease)    Hypertension    Neuropathy    Obesity    Prediabetes    Seizures (HCC)    1 seizure as a chold; unknown etiology and has never been on meds   Past Surgical History:  Procedure Laterality Date   APPENDECTOMY      CARPAL TUNNEL RELEASE Right 08/01/2015   Procedure: RIGHT CARPAL TUNNEL RELEASE;  Surgeon: Vickki Hearing, MD;  Location: [facility];  Service: Orthopedics;  Laterality: Right;   CERVICAL POLYPECTOMY  09/14/2017   Procedure: ENDOMETRIAL POLYPECTOMY;  Surgeon: Lazaro Arms, MD;  Location: [facility];  Service: Gynecology;;   COLONOSCOPY N/A 06/15/2017   Procedure: COLONOSCOPY;  Surgeon: Malissa Hippo, MD;  Location: [facility];  Service: Endoscopy;  Laterality: N/A;  830   ENDOMETRIAL ABLATION N/A 09/14/2017   Procedure: ENDOMETRIAL ABLATION( [place]);  Surgeon: Lazaro Arms, MD;  Location: [facility];  Service: Gynecology;  Laterality: N/A;   ESOPHAGOGASTRODUODENOSCOPY N/A 05/25/2013   Procedure: ESOPHAGOGASTRODUODENOSCOPY (EGD);  Surgeon: Malissa Hippo, MD;  Location: [facility];  Service: Endoscopy;  Laterality: N/A;  220   GASTRIC BYPASS     HYSTEROSCOPY WITH D & C N/A 09/14/2017   Procedure: DILATATION AND CURETTAGE /HYSTEROSCOPY;  Surgeon: Lazaro Arms, MD;  Location: [facility];  Service: Gynecology;  Laterality: N/A;   TUBAL LIGATION       Current Meds  Medication Sig   nirmatrelvir/ritonavir (PAXLOVID) 20 x 150 MG & 10 x 100MG  TABS Take 3 tablets by mouth 2 (two) times daily for 5 days. (Take nirmatrelvir 150 mg two tablets twice daily for 5 days and ritonavir 100 mg one tablet twice daily for  5 days) Patient GFR is >60.   Current Facility-Administered Medications for the 04/04/23 encounter (Video Visit) with Anabel Halon, MD  Medication   bupivacaine-meloxicam ER (ZYNRELEF) injection 400 mg     Allergies:   Codeine, Effexor xr [venlafaxine hcl er], Fluoxetine, Hydrocodone, Methotrexate derivatives, and Latex   ROS:   Please see the history of present illness.     All other systems reviewed and are negative.   Labs/Other Tests and Data Reviewed:    Recent Labs: 12/13/2022: ALT 13; BUN 20; Creat 0.90; Hemoglobin 13.8; Platelets 315; Potassium 4.8; Sodium  139 12/16/2022: TSH 1.430   Recent Lipid Panel Lab Results  Component Value Date/Time   CHOL 190 12/16/2022 08:55 AM   TRIG 88 12/16/2022 08:55 AM   HDL 57 12/16/2022 08:55 AM   CHOLHDL 3.3 12/16/2022 08:55 AM   CHOLHDL 2.8 02/05/2020 07:08 AM   LDLCALC 117 (H) 12/16/2022 08:55 AM   LDLCALC 88 02/05/2020 07:08 AM    Wt Readings from Last 3 Encounters:  03/28/23 214 lb (97.1 kg)  03/03/23 214 lb 1.9 oz (97.1 kg)  01/05/23 214 lb (97.1 kg)     Objective:    Vital Signs:  There were no vitals taken for this visit.   VITAL SIGNS:  reviewed GEN:  no acute distress EYES:  sclerae anicteric, EOMI - Extraocular Movements Intact RESPIRATORY:  normal respiratory effort, symmetric expansion NEURO:  alert and oriented x 3, no obvious focal deficit PSYCH:  normal affect   ASSESSMENT & PLAN:    COVID-19 Started Paxlovid Advised to avoid phentermine and Lasix for now Continue Mucinex PRN for cough Tylenol as needed for fever or myalgias Self quarantine for at least 5 days from symptom onset and/or at least 24 hour afebrile period, which ever is later   I discussed the assessment and treatment plan with the patient. The patient was provided an opportunity to ask questions, and all were answered. The patient agreed with the plan and demonstrated an understanding of the instructions.   The patient was advised to call back or seek an in-person evaluation if the symptoms worsen or if the condition fails to improve as anticipated.  The above assessment and management plan was discussed with the patient. The patient verbalized understanding of and has agreed to the management plan.   Medication Adjustments/Labs and Tests Ordered: Current medicines are reviewed at length with the patient today.  Concerns regarding medicines are outlined above.   Tests Ordered: No orders of the defined types were placed in this encounter.   Medication Changes: Meds ordered this encounter  Medications    nirmatrelvir/ritonavir (PAXLOVID) 20 x 150 MG & 10 x 100MG  TABS    Sig: Take 3 tablets by mouth 2 (two) times daily for 5 days. (Take nirmatrelvir 150 mg two tablets twice daily for 5 days and ritonavir 100 mg one tablet twice daily for 5 days) Patient GFR is >60.    Dispense:  30 tablet    Refill:  0     Note: This dictation was prepared with Dragon dictation along with smaller phrase technology. Similar sounding words can be transcribed inadequately or may not be corrected upon review. Any transcriptional errors that result from this process are unintentional.      Disposition:  Follow up  Signed, Anabel Halon, MD  04/04/2023 3:55 PM     [facility]

## 2023-04-04 NOTE — Patient Instructions (Addendum)
Please start taking Paxlovid as prescribed.  Please access the coupon from following website. Please select - Enroll in the [employer] Savings Program to download an activated card on the website and fill out the form to access the coupon.  [email or web address]

## 2023-04-04 NOTE — Assessment & Plan Note (Signed)
Started Paxlovid Advised to avoid phentermine and Lasix for now Continue Mucinex PRN for cough Tylenol as needed for fever or myalgias Self quarantine for at least 5 days from symptom onset and/or at least 24 hour afebrile period, which ever is later

## 2023-04-04 NOTE — Telephone Encounter (Signed)
LVM to schedule appt

## 2023-04-10 ENCOUNTER — Other Ambulatory Visit: Payer: Self-pay | Admitting: Family Medicine

## 2023-04-14 ENCOUNTER — Ambulatory Visit: Payer: BC Managed Care – PPO | Admitting: Family Medicine

## 2023-04-14 ENCOUNTER — Encounter: Payer: Self-pay | Admitting: Family Medicine

## 2023-04-14 VITALS — BP 117/74 | HR 75 | Ht 66.0 in | Wt 214.1 lb

## 2023-04-14 DIAGNOSIS — L040 Acute lymphadenitis of face, head and neck: Secondary | ICD-10-CM | POA: Diagnosis not present

## 2023-04-14 DIAGNOSIS — Z6834 Body mass index (BMI) 34.0-34.9, adult: Secondary | ICD-10-CM

## 2023-04-14 DIAGNOSIS — I10 Essential (primary) hypertension: Secondary | ICD-10-CM | POA: Diagnosis not present

## 2023-04-14 MED ORDER — CEPHALEXIN 500 MG PO CAPS: 500.0000 mg | ORAL_CAPSULE | Freq: Two times a day (BID) | ORAL | 0 refills | Status: DC

## 2023-04-14 MED ORDER — FLUCONAZOLE 150 MG PO TABS: 150.0000 mg | ORAL_TABLET | Freq: Once | ORAL | 0 refills | Status: AC

## 2023-04-14 NOTE — Patient Instructions (Signed)
Pls reschedule Nov follow up to early January, call if you need me sooner  1 week keflex is prescribed for swollen lymph node,I believe that your dental problem is the source of the gland  Thanks for choosing [facility], we consider it a privelige to serve you.

## 2023-04-14 NOTE — Assessment & Plan Note (Signed)
4 day h/o acute painful swelling  of left gland under jaw, keflex x 1 week

## 2023-04-19 ENCOUNTER — Telehealth: Payer: Self-pay | Admitting: Orthopedic Surgery

## 2023-04-19 ENCOUNTER — Other Ambulatory Visit (HOSPITAL_COMMUNITY): Payer: BC Managed Care – PPO

## 2023-04-19 NOTE — Telephone Encounter (Signed)
Great, thanks for letting me know

## 2023-04-19 NOTE — Telephone Encounter (Signed)
Phone call to RS surgery Dr Rexene Edison not doing surgery on the day we RS her to [facility] and Dr Rexene Edison say she is fine to go sooner than the one month that I had been told after covid now the new number is 10 days as long as she feels better  I asked her about Nov 6th she will check and find out  She will drop off disability form for [place] to fill out I told her there is a form fee of $25 and they will send forms to [employer] to be completed.

## 2023-04-26 ENCOUNTER — Telehealth: Payer: Self-pay | Admitting: Orthopedic Surgery

## 2023-04-26 NOTE — Telephone Encounter (Signed)
FMLA forms received. To [employer].

## 2023-04-27 ENCOUNTER — Other Ambulatory Visit: Payer: Self-pay | Admitting: Orthopedic Surgery

## 2023-04-27 DIAGNOSIS — M1710 Unilateral primary osteoarthritis, unspecified knee: Secondary | ICD-10-CM

## 2023-04-29 ENCOUNTER — Other Ambulatory Visit: Payer: Self-pay | Admitting: Orthopedic Surgery

## 2023-04-29 DIAGNOSIS — M1712 Unilateral primary osteoarthritis, left knee: Secondary | ICD-10-CM

## 2023-05-02 NOTE — Progress Notes (Signed)
   Julie Ryan     MRN: [id number]      DOB: 01-28-1966  Chief Complaint  Patient presents with   Follow-up    Follow up lingering covid symptoms    HPI Julie Ryan is herewith above concern. C/o swollen glabnds under jaw , no fever or chills but known dental problem   ROS Denies recent fever or chills. Denies sinus pressure, nasal congestion, ear pain or sore throat. Denies chest congestion, productive cough or wheezing. Denies chest pains, palpitations and leg swelling  Denies depression, anxiety or insomnia. Denies skin break down or rash.   PE  BP 117/74 (BP Location: Right Arm, Patient Position: Sitting, Cuff Size: Large)   Pulse 75   Ht 5\' 6"  (1.676 m)   Wt 214 lb 1.3 oz (97.1 kg)   SpO2 94%   BMI 34.55 kg/m   Patient alert and oriented and in no cardiopulmonary distress.  HEENT: No facial asymmetry, EOMI,     Neck supple left submental lymphadenitis  Chest: Clear to auscultation bilaterally.  CVS: S1, S2 no murmurs, no S3.Regular rate.  ABD: Soft non tender.   Ext: No edema  MS: Adequate ROM spine, shoulders, hips and reduced in knee.  Skin: Intact, no ulcerations or rash noted.  Psych: Good eye contact, normal affect. Memory intact not anxious or depressed appearing.  CNS: CN 2-12 intact, power,  normal throughout.no focal deficits noted.   Assessment & Plan  Acute cervical adenitis 4 day h/o acute painful swelling  of left gland under jaw, keflex x 1 week  Essential hypertension Controlled, no change in medication   MORBID OBESITY  Patient re-educated about  the importance of commitment to a  minimum of 150 minutes of exercise per week as able.  The importance of healthy food choices with portion control discussed, as well as eating regularly and within a 12 hour window most days. The need to choose "clean , green" food 50 to 75% of the time is discussed, as well as to make water the primary drink and set a goal of 64 ounces water daily.        04/14/2023   11:48 AM 03/28/2023    4:28 PM 03/03/2023    8:13 AM  Weight /BMI  Weight 214 lb 1.3 oz 214 lb 214 lb 1.9 oz  Height 5\' 6"  (1.676 m) 5\' 6"  (1.676 m) 5\' 6"  (1.676 m)  BMI 34.55 kg/m2 34.54 kg/m2 34.56 kg/m2

## 2023-05-02 NOTE — Patient Instructions (Signed)
Julie Ryan  05/02/2023     @[email or web address] @   Your procedure is scheduled on 05/11/23.  Report to [facility] at 0830 A.M.  Call this number if you have problems the morning of surgery:  [phone]  If you experience any cold or flu symptoms such as cough, fever, chills, shortness of breath, etc. between now and your scheduled surgery, please notify us at the above number.   Remember:  Do not eat or drink after midnight.   You may drink clear liquids until 0630 .    Clear liquids allowed are:                    Water, Juice (No red color; non-citric and without pulp; diabetics please choose diet or no sugar options), Carbonated beverages (diabetics please choose diet or no sugar options), Clear Tea (No creamer, milk, or cream, including half & half and powdered creamer), Black Coffee Only (No creamer, milk or cream, including half & half and powdered creamer), and Clear Sports drink (No red color; diabetics please choose diet or no sugar options)    Take these medicines the morning of surgery with A SIP OF WATER norvasc, neurontin, robaxin, metoprolol, ultram.  LAST DOSE OF PHENTERAMINE TO BE 05/03/23    Do not wear jewelry, make-up or nail polish, including gel polish,  artificial nails, or any other type of covering on natural nails (fingers and  toes).  Do not wear lotions, powders, or perfumes, or deodorant.  Do not shave 48 hours prior to surgery.  Men may shave face and neck.  Do not bring valuables to the hospital.  [facility] is not responsible for any belongings or valuables.  Contacts, dentures or bridgework may not be worn into surgery.  Leave your suitcase in the car.  After surgery it may be brought to your room.  For patients admitted to the hospital, discharge time will be determined by your treatment team.  Patients discharged the day of surgery will not be allowed to drive home.   Name and phone number of your driver:   FAMILY Special instructions:   DO NOT SMOKE OR VAPE 24 HOURS PRIOR TO PROCEDURE  Please read over the following fact sheets that you were given. Coughing and Deep Breathing, Surgical Site Infection Prevention, Anesthesia Post-op Instructions, and Care and Recovery After Surgery      PATIENT INSTRUCTIONS POST-ANESTHESIA  IMMEDIATELY FOLLOWING SURGERY:  Do not drive or operate machinery for the first twenty four hours after surgery.  Do not make any important decisions for twenty four hours after surgery or while taking narcotic pain medications or sedatives.  If you develop intractable nausea and vomiting or a severe headache please notify your doctor immediately.  FOLLOW-UP:  Please make an appointment with your surgeon as instructed. You do not need to follow up with anesthesia unless specifically instructed to do so.  WOUND CARE INSTRUCTIONS (if applicable):  Keep a dry clean dressing on the anesthesia/puncture wound site if there is drainage.  Once the wound has quit draining you may leave it open to air.  Generally you should leave the bandage intact for twenty four hours unless there is drainage.  If the epidural site drains for more than 36-48 hours please call the anesthesia department.  QUESTIONS?:  Please feel free to call your physician or the hospital operator if you have any questions, and they will be happy to assist you.      Spinal Anesthesia  and Epidural Anesthesia Spinal and epidural anesthesia are ways to numb parts of your body. They are done by injecting a numbing medicine into your back, near the spinal cord. These types of anesthesia can be used: To prevent pain during childbirth. To control pain during or after a major surgery of the abdomen, chest, pelvis, hips, or legs. To treat long-term (chronic) pain in your back or leg with a steroid medicine. Tell a health care provider about: Any allergies you have. All medicines you are taking, including vitamins, herbs, eye drops, creams, and  over-the-counter medicines. Any problems you or family members have had with anesthesia. Any bleeding problems you have. Any medical conditions or surgeries you have had. Whether you are pregnant or may be pregnant. Any recent alcohol, tobacco, or drug use. What are the risks? Your health care provider will talk with you about risks. These may include: Hypotension. This is a drop in blood pressure. Trouble urinating or having bowel movements. This should go away over time. Itching from the medicine. Nausea. Limited or no pain relief. Spinal headache. This is caused by leakage of spinal fluid. Slow breathing and drowsiness. Other risks include: Spinal hematoma. This is when you have bleeding between the bones in your spine (vertebrae) and the lining of the spinal cord. Short-term nerve damage. Infection. Being unable to move (paralysis). This may be permanent. Allergic reactions to medicines. In rare cases, epidural anesthesia may cause: Seizures. Severe breathing troubles. Cardiac arrest. This is when the heart stops working. You should not receive these types of anesthesia if you have: A disorder that affects your brain. Hypovolemia. This is when you do not have enough fluid in your body. A blood disorder. This includes thrombocytopenia, which is when you do not have enough blood platelets. What happens before the procedure? When to stop eating and drinking Follow instructions from your health care provider about what you may eat and drink. These may include: 8 hours before your procedure Stop eating most foods. Do not eat meat, fried foods, or fatty foods. Eat only light foods, such as toast or crackers. All liquids are okay except energy drinks and alcohol. 6 hours before your procedure Stop eating. Drink only clear liquids, such as water, clear fruit juice, black coffee, plain tea, and sports drinks. Do not drink energy drinks or alcohol. 2 hours before your  procedure Stop drinking all liquids. You may be allowed to take medicines with small sips of water. If you do not follow your health care provider's instructions, your procedure may be delayed or canceled. Medicines Ask your health care provider about: Changing or stopping your regular medicines. These include any diabetes medicines or blood thinners you take. Taking medicines such as aspirin and ibuprofen. These medicines can thin your blood. Do not take them unless your health care provider tells you to. Taking over-the-counter medicines, vitamins, herbs, and supplements. General instructions If you use a device to help with breathing (sleep apnea device), ask your health care provider if you should bring it with you on the day of your surgery or procedure. If you will be going home right after the procedure, plan to have a responsible adult: Take you home from the hospital or clinic. You will not be allowed to drive. Care for you for the time you are told. What happens during the procedure?  An IV will be inserted into one of your veins. You may be given: A sedative. This helps you relax. Local anesthesia. This will numb the  skin in the injection area. You may be asked to sit up and lean forward over a pillow. Or you may be asked to lie on your side with your knees and your chin bent toward your chest. These positions open up the space between the bones in your back (vertebrae). The area where the spinal or epidural medicine will be injected will be cleaned. A needle will be put between your vertebrae. While this is being done: Breathe normally. Stay as still and quiet as you can. If you feel a tingling shock or pain going down your leg, tell your health care provider but try not to move. Medicine will be put through the needle. If epidural anesthesia is given, the medicine will be put into your back, just outside of the area that covers and protects your spinal cord (epidural  space). If spinal anesthesia is given, the medicine will be put into your back and into the fluid around your spinal cord. The medicine will start to work. For epidural anesthesia, it may be given over time through a small, thin tube (catheter). The tube will stay in your back for as long as you need help for pain. For spinal anesthesia, the medicine will only be injected one time. No catheter is needed. A small bandage (dressing) may be placed over the catheter or injection site. The procedure may vary among health care providers and hospitals. What happens after the procedure? Your blood pressure, heart rate, breathing rate, and blood oxygen level will be monitored until you leave the hospital or clinic. You will need to stay in bed until your health care provider says it is safe for you to walk. If you have a catheter in your back, it will be removed when you no longer need it. This information is not intended to replace advice given to you by your health care provider. Make sure you discuss any questions you have with your health care provider. Document Revised: 01/21/2022 Document Reviewed: 01/21/2022 Elsevier Patient Education  2024 [employer] General Anesthesia, Adult General anesthesia is the use of medicine to make you fall asleep (unconscious) for a medical procedure. General anesthesia must be used for certain procedures. It is often recommended for surgery or procedures that: Last a long time. Require you to be still or in an unusual position. Are major and can cause blood loss. Affect your breathing. The medicines used for general anesthesia are called general anesthetics. During general anesthesia, these medicines are given along with medicines that: Prevent pain. Control your blood pressure. Relax your muscles. Prevent nausea and vomiting after the procedure. Tell a health care provider about: Any allergies you have. All medicines you are taking, including vitamins,  herbs, eye drops, creams, and over-the-counter medicines. Your history of any: Medical conditions you have, including: High blood pressure. Bleeding problems. Diabetes. Heart or lung conditions, such as: Heart failure. Sleep apnea. Asthma. Chronic obstructive pulmonary disease (COPD). Current or recent illnesses, such as: Upper respiratory, chest, or ear infections. Cough or fever. Tobacco or drug use, including marijuana or alcohol use. Depression or anxiety. Surgeries and types of anesthetics you have had. Problems you or family members have had with anesthetic medicines. Whether you are pregnant or may be pregnant. Whether you have any chipped or loose teeth, dentures, caps, bridgework, or issues with your mouth, swallowing, or choking. What are the risks? Your health care provider will talk with you about risks. These may include: Allergic reaction to the medicines. Lung and heart problems. Inhaling  food or liquid from the stomach into the lungs (aspiration). Nerve injury. Injury to the lips, mouth, teeth, or gums. Stroke. Waking up during your procedure and being unable to move. This is rare. These problems are more likely to develop if you are having a major surgery or if you have an advanced or serious medical condition. You can prevent some of these complications by answering all of your health care provider's questions thoroughly and by following all instructions before your procedure. General anesthesia can cause side effects, including: Nausea or vomiting. A sore throat or hoarseness from the breathing tube. Wheezing or coughing. Shaking chills or feeling cold. Body aches. Sleepiness. Confusion, agitation (delirium), or anxiety. What happens before the procedure? When to stop eating and drinking Follow instructions from your health care provider about what you may eat and drink before your procedure. If you do not follow your health care provider's instructions,  your procedure may be delayed or canceled. Medicines Ask your health care provider about: Changing or stopping your regular medicines. These include any diabetes medicines or blood thinners you take. Taking medicines such as aspirin and ibuprofen. These medicines can thin your blood. Do not take them unless your health care provider tells you to. Taking over-the-counter medicines, vitamins, herbs, and supplements. General instructions Do not use any products that contain nicotine or tobacco for at least 4 weeks before the procedure. These products include cigarettes, chewing tobacco, and vaping devices, such as e-cigarettes. If you need help quitting, ask your health care provider. If you brush your teeth on the morning of the procedure, make sure to spit out all of the water and toothpaste. If told by your health care provider, bring your sleep apnea device with you to surgery (if applicable). If you will be going home right after the procedure, plan to have a responsible adult: Take you home from the hospital or clinic. You will not be allowed to drive. Care for you for the time you are told. What happens during the procedure?  An IV will be inserted into one of your veins. You will be given one or more of the following through a face mask or IV: A sedative. This helps you relax. Anesthesia. This will: Numb certain areas of your body. Make you fall asleep for surgery. After you are unconscious, a breathing tube may be inserted down your throat to help you breathe. This will be removed before you wake up. An anesthesia provider, such as an anesthesiologist, will stay with you throughout your procedure. The anesthesia provider will: Keep you comfortable and safe by continuing to give you medicines and adjusting the amount of medicine that you get. Monitor your blood pressure, heart rate, and oxygen levels to make sure that the anesthetics do not cause any problems. The procedure may vary  among health care providers and hospitals. What happens after the procedure? Your blood pressure, temperature, heart rate, breathing rate, and blood oxygen level will be monitored until you leave the hospital or clinic. You will wake up in a recovery area. You may wake up slowly. You may be given medicine to help you with pain, nausea, or any other side effects from the anesthesia. Summary General anesthesia is the use of medicine to make you fall asleep (unconscious) for a medical procedure. Follow your health care provider's instructions about when to stop eating, drinking, or taking certain medicines before your procedure. Plan to have a responsible adult take you home from the hospital or clinic. This information  is not intended to replace advice given to you by your health care provider. Make sure you discuss any questions you have with your health care provider. Document Revised: 09/17/2021 Document Reviewed: 09/17/2021 Elsevier Patient Education  2024 [employer] Total Knee Replacement  Total knee replacement is a procedure to replace the damaged knee joint with an artificial (prosthetic) knee joint. The purpose of this surgery is to reduce knee pain and improve knee function. The prosthetic knee joint (prosthesis) may be made of metal, plastic, or ceramic. It replaces parts of the thigh bone (femur), lower leg bone (tibia), and kneecap (patella) that are removed during the procedure. Tell a health care provider about: Any allergies you have. All medicines you are taking, including vitamins, herbs, eye drops, creams, and over-the-counter medicines. Any problems you or family members have had with anesthetic medicines. Any blood disorders you have. Any surgeries you have had. Any medical conditions you have. Whether you are pregnant or may be pregnant. What are the risks? Generally, this is a safe procedure. However, problems may occur, including: Infection. Bleeding. A blood  clot that forms in your leg. The clot may break loose and travel to your lungs (pulmonary embolism). Allergic reactions to medicines. Damage to nerves or other structures. Problems with the knee, such as: Decreased range of motion of the knee. Instability of the knee. Loosening of the prosthetic joint. Knee pain that does not go away. What happens before the procedure? Staying hydrated Follow instructions from your health care provider about hydration, which may include: Up to 2 hours before the procedure - you may continue to drink clear liquids, such as water, clear fruit juice, black coffee, and plain tea.  Eating and drinking restrictions Follow instructions from your health care provider about eating and drinking, which may include: 8 hours before the procedure - stop eating heavy meals or foods, such as meat, fried foods, or fatty foods. 6 hours before the procedure - stop eating light meals or foods, such as toast or cereal. 6 hours before the procedure - stop drinking milk or drinks that contain milk. 2 hours before the procedure - stop drinking clear liquids. Medicines Ask your health care provider about: Changing or stopping your regular medicines. This is especially important if you are taking diabetes medicines or blood thinners. Taking medicines such as aspirin and ibuprofen. These medicines can thin your blood. Do not take these medicines unless your health care provider tells you to take them. Taking over-the-counter medicines, vitamins, herbs, and supplements. Tests and exams You may have: A physical exam. Tests, such as X-rays, MRI, CT scan, or bone scan. A blood or urine sample taken. Lifestyle  Keep your body and teeth clean. Germs from anywhere in your body can travel to your new joint and infect it. Tell your health care provider if you: Plan to have dental care and routine cleanings. Develop any skin infections. If your health care provider prescribes physical  therapy, do exercises as instructed. If you are overweight, work with your health care provider to reach a safe weight. Extra weight can put pressure on your knee. Do not use any products that contain nicotine or tobacco for at least 4 weeks before the procedure. These products include cigarettes, chewing tobacco, and vaping devices, such as e-cigarettes. If you need help quitting, ask your health care provider. Surgery safety Ask your health care provider: How your surgery site will be marked. What steps will be taken to help prevent infection. These steps may  include: Removing hair at the surgery site. Washing skin with a germ-killing soap. Taking antibiotic medicine. General instructions Do not shave your legs just before surgery. If hair removal is needed, it will be done in the hospital. Plan to have a responsible adult take you home from the hospital or clinic. Plan to have a responsible adult care for you for the time you are told after you leave the hospital or clinic. This is important. It is recommended that you have someone to help care for you for at least 4-6 weeks after your procedure. What happens during the procedure? An IV will be inserted into one of your veins. You will be given one or more of the following: A medicine to help you relax (sedative). A medicine that is injected into an area of your body near the nerves to numb everything below the injection site (peripheral nerve block). A medicine that is injected into your spine to numb the area below and slightly above the injection site (spinal anesthetic). A medicine to make you fall asleep (general anesthetic). An incision will be made in your knee. Damaged cartilage and bone will be removed from your femur, tibia, and patella. Parts of the prosthesis (liners) will be placed over the areas of bone and cartilage that were removed. A metal liner will be placed over your femur, and plastic liners will be placed over your  tibia and the underside of your patella. Your incision will be closed with stitches (sutures), staples, skin glue, or adhesive strips. A bandage (dressing) will be placed over your incision. The procedure may vary among health care providers and hospitals. What happens after the procedure? Your blood pressure, heart rate, breathing rate, and blood oxygen level will be monitored until you leave the hospital or clinic. You will be given medicines to help manage pain. You may: Continue to receive fluids through an IV. Have to wear compression stockings. These stockings help to prevent blood clots and reduce swelling in your legs. You will be encouraged to move. A physical therapist will teach you how to use crutches or a walker and how to exercise at home. Do not drive until your health care provider approves. Summary Total knee replacement is a procedure to replace the knee joint with an artificial knee joint. Before the procedure, follow instructions from your health care provider about eating and drinking. Plan to have a responsible adult take you home from the hospital or clinic. This information is not intended to replace advice given to you by your health care provider. Make sure you discuss any questions you have with your health care provider. Document Revised: 12/10/2019 Document Reviewed: 12/11/2019 Elsevier Patient Education  2024 [employer] Total Knee Replacement, Care After After the procedure, it is common to have stiffness and discomfort, or redness, pain, and swelling around your cut from surgery (incision). You may also have a small amount of blood or clear fluid coming from your incision. Follow these instructions at home: Your doctor may give you more instructions. If you have problems, contact your doctor. Medicines Take over-the-counter and prescription medicines only as told by your doctor. If you were prescribed a blood thinner, take it as told by your doctor. If  told, take steps to prevent problems with pooping (constipation). You may need to: Drink enough fluid to keep your pee (urine) pale yellow. Take medicines. You will be told what medicines to take. Eat foods that are high in fiber. These include beans, whole grains, and fresh  fruits and vegetables. Limit foods that are high in fat and sugar. These include fried or sweet foods. Incision care  Follow instructions from your doctor about how to take care of your incision. Make sure you: Wash your hands with soap and water for at least 20 seconds before and after you change your bandage. If you cannot use soap and water, use hand sanitizer. Change your bandage. Leave stitches, staples, or skin glue in place for at least 2 weeks. Leave tape strips alone unless you are told to take them off. You may trim the edges of the tape strips if they curl up. Do not take baths, swim, or use a hot tub until your doctor says it is okay. Check your incision every day for signs of infection. Check for: More redness, swelling, or pain. More fluid or blood. Warmth. Pus or a bad smell. Activity Rest as told by your doctor. Get up to take short walks every 1 to 2 hours. Ask for help if you feel weak or unsteady. Follow instructions from your doctor about: Using a walker, crutches, or a cane. How much body weight you may safely support on your affected leg (weight-bearing restrictions). How to get out of a bed and chair and how to go up and down stairs. A physical therapist will show you how to do this. Do exercises as told by your doctor or physical therapist. Avoid activities that put stress on your knees. These include running, jumping rope, and doing jumping jacks. Do not play contact sports until your doctor says it is okay. Return to your normal activities when your doctor says that it is safe. Managing pain, stiffness, and swelling  If told, put ice on your knee. To do this: Put ice in a plastic bag or  use an icing device (cold flow pad). Follow your doctor's directions about how to use the icing device. Place a towel between your skin and the bag or between your skin and the icing device. Leave the ice on for 20 minutes, 2-3 times a day. Take off the ice if your skin turns bright red. This is very important. If you cannot feel pain, heat, or cold, you have a greater risk of damage to the area. Move your toes often. Raise your leg above the level of your heart while you are sitting or lying down. Use a few pillows to keep your leg straight. Do not put a pillow just under the knee. If the knee is bent for a long time, this may make the knee stiff. Wear elastic knee support as told by your doctor. Safety  To help prevent falls: Keep floors clear of objects you may trip over. Place items that you may need within easy reach. Wear an apron or tool belt with pockets for carrying objects. This leaves your hands free to help with your balance. Do not drive or use machines until your doctor says it is safe. General instructions Wear compression stockings as told by your doctor. Continue with breathing exercises. This helps prevent lung infection. Do not smoke or use any products that contain nicotine or tobacco. If you need help quitting, ask your doctor. If you plan to visit a dentist: Tell your doctor. Ask about things to do before your teeth are cleaned. Tell your dentist about your new joint. Keep all follow-up visits. Contact a doctor if: You have a fever or chills. You have a cough or feel short of breath. Your medicine is not controlling your  pain. You have any of these signs of infection around your incision: More redness, swelling, or pain. More fluid or blood. Warmth. Pus or a bad smell. You fall. Get help right away if: You have very bad pain. You have trouble breathing. You have chest pain. You have pain in your calf or leg. You have redness, swelling, or warmth in your  calf or leg. Your incision breaks open after the stitches or staples are taken out. These symptoms may be an emergency. Get help right away. Call your local emergency services (911 in the [place] Do not wait to see if the symptoms will go away. Do not drive yourself to the hospital. Summary After the procedure, it is common to have stiffness and discomfort, or redness, pain, and swelling around your incision. Follow instructions from your doctor about how to take care of your incision. Use crutches, a walker, or a cane as told by your doctor. If you were prescribed a blood thinner, take it as told by your doctor. Keep all follow-up visits. This information is not intended to replace advice given to you by your health care provider. Make sure you discuss any questions you have with your health care provider. Document Revised: 12/11/2019 Document Reviewed: 12/11/2019 Elsevier Patient Education  2024 [employer] How to Use Chlorhexidine Before Surgery Chlorhexidine gluconate (CHG) is a germ-killing (antiseptic) solution that is used to clean the skin. It can get rid of the bacteria that normally live on the skin and can keep them away for about 24 hours. To clean your skin with CHG, you may be given: A CHG solution to use in the shower or as part of a sponge bath. A prepackaged cloth that contains CHG. Cleaning your skin with CHG may help lower the risk for infection: While you are staying in the intensive care unit of the hospital. If you have a vascular access, such as a central line, to provide short-term or long-term access to your veins. If you have a catheter to drain urine from your bladder. If you are on a ventilator. A ventilator is a machine that helps you breathe by moving air in and out of your lungs. After surgery. What are the risks? Risks of using CHG include: A skin reaction. Hearing loss, if CHG gets in your ears and you have a perforated eardrum. Eye injury, if CHG gets  in your eyes and is not rinsed out. The CHG product catching fire. Make sure that you avoid smoking and flames after applying CHG to your skin. Do not use CHG: If you have a chlorhexidine allergy or have previously reacted to chlorhexidine. On babies younger than 48 months of age. How to use CHG solution Use CHG only as told by your health care provider, and follow the instructions on the label. Use the full amount of CHG as directed. Usually, this is one bottle. During a shower Follow these steps when using CHG solution during a shower (unless your health care provider gives you different instructions): Start the shower. Use your normal soap and shampoo to wash your face and hair. Turn off the shower or move out of the shower stream. Pour the CHG onto a clean washcloth. Do not use any type of brush or rough-edged sponge. Starting at your neck, lather your body down to your toes. Make sure you follow these instructions: If you will be having surgery, pay special attention to the part of your body where you will be having surgery. Scrub this area  for at least 1 minute. Do not use CHG on your head or face. If the solution gets into your ears or eyes, rinse them well with water. Avoid your genital area. Avoid any areas of skin that have broken skin, cuts, or scrapes. Scrub your back and under your arms. Make sure to wash skin folds. Let the lather sit on your skin for 1-2 minutes or as long as told by your health care provider. Thoroughly rinse your entire body in the shower. Make sure that all body creases and crevices are rinsed well. Dry off with a clean towel. Do not put any substances on your body afterward--such as powder, lotion, or perfume--unless you are told to do so by your health care provider. Only use lotions that are recommended by the manufacturer. Put on clean clothes or pajamas. If it is the night before your surgery, sleep in clean sheets.  During a sponge bath Follow these  steps when using CHG solution during a sponge bath (unless your health care provider gives you different instructions): Use your normal soap and shampoo to wash your face and hair. Pour the CHG onto a clean washcloth. Starting at your neck, lather your body down to your toes. Make sure you follow these instructions: If you will be having surgery, pay special attention to the part of your body where you will be having surgery. Scrub this area for at least 1 minute. Do not use CHG on your head or face. If the solution gets into your ears or eyes, rinse them well with water. Avoid your genital area. Avoid any areas of skin that have broken skin, cuts, or scrapes. Scrub your back and under your arms. Make sure to wash skin folds. Let the lather sit on your skin for 1-2 minutes or as long as told by your health care provider. Using a different clean, wet washcloth, thoroughly rinse your entire body. Make sure that all body creases and crevices are rinsed well. Dry off with a clean towel. Do not put any substances on your body afterward--such as powder, lotion, or perfume--unless you are told to do so by your health care provider. Only use lotions that are recommended by the manufacturer. Put on clean clothes or pajamas. If it is the night before your surgery, sleep in clean sheets. How to use CHG prepackaged cloths Only use CHG cloths as told by your health care provider, and follow the instructions on the label. Use the CHG cloth on clean, dry skin. Do not use the CHG cloth on your head or face unless your health care provider tells you to. When washing with the CHG cloth: Avoid your genital area. Avoid any areas of skin that have broken skin, cuts, or scrapes. Before surgery Follow these steps when using a CHG cloth to clean before surgery (unless your health care provider gives you different instructions): Using the CHG cloth, vigorously scrub the part of your body where you will be having  surgery. Scrub using a back-and-forth motion for 3 minutes. The area on your body should be completely wet with CHG when you are done scrubbing. Do not rinse. Discard the cloth and let the area air-dry. Do not put any substances on the area afterward, such as powder, lotion, or perfume. Put on clean clothes or pajamas. If it is the night before your surgery, sleep in clean sheets.  For general bathing Follow these steps when using CHG cloths for general bathing (unless your health care provider gives you  different instructions). Use a separate CHG cloth for each area of your body. Make sure you wash between any folds of skin and between your fingers and toes. Wash your body in the following order, switching to a new cloth after each step: The front of your neck, shoulders, and chest. Both of your arms, under your arms, and your hands. Your stomach and groin area, avoiding the genitals. Your right leg and foot. Your left leg and foot. The back of your neck, your back, and your buttocks. Do not rinse. Discard the cloth and let the area air-dry. Do not put any substances on your body afterward--such as powder, lotion, or perfume--unless you are told to do so by your health care provider. Only use lotions that are recommended by the manufacturer. Put on clean clothes or pajamas. Contact a health care provider if: Your skin gets irritated after scrubbing. You have questions about using your solution or cloth. You swallow any chlorhexidine. Call your local poison control center [phone] in the [place] Get help right away if: Your eyes itch badly, or they become very red or swollen. Your skin itches badly and is red or swollen. Your hearing changes. You have trouble seeing. You have swelling or tingling in your mouth or throat. You have trouble breathing. These symptoms may represent a serious problem that is an emergency. Do not wait to see if the symptoms will go away. Get medical help  right away. Call your local emergency services (911 in the [place] Do not drive yourself to the hospital. Summary Chlorhexidine gluconate (CHG) is a germ-killing (antiseptic) solution that is used to clean the skin. Cleaning your skin with CHG may help to lower your risk for infection. You may be given CHG to use for bathing. It may be in a bottle or in a prepackaged cloth to use on your skin. Carefully follow your health care provider's instructions and the instructions on the product label. Do not use CHG if you have a chlorhexidine allergy. Contact your health care provider if your skin gets irritated after scrubbing. This information is not intended to replace advice given to you by your health care provider. Make sure you discuss any questions you have with your health care provider. Document Revised: 10/19/2021 Document Reviewed: 09/01/2020 Elsevier Patient Education  2023 [employer].

## 2023-05-02 NOTE — Assessment & Plan Note (Signed)
  Patient re-educated about  the importance of commitment to a  minimum of 150 minutes of exercise per week as able.  The importance of healthy food choices with portion control discussed, as well as eating regularly and within a 12 hour window most days. The need to choose "clean , green" food 50 to 75% of the time is discussed, as well as to make water the primary drink and set a goal of 64 ounces water daily.       04/14/2023   11:48 AM 03/28/2023    4:28 PM 03/03/2023    8:13 AM  Weight /BMI  Weight 214 lb 1.3 oz 214 lb 214 lb 1.9 oz  Height 5\' 6"  (1.676 m) 5\' 6"  (1.676 m) 5\' 6"  (1.676 m)  BMI 34.55 kg/m2 34.54 kg/m2 34.56 kg/m2

## 2023-05-02 NOTE — Assessment & Plan Note (Signed)
Controlled, no change in medication  

## 2023-05-03 ENCOUNTER — Encounter (HOSPITAL_COMMUNITY): Payer: Self-pay

## 2023-05-03 ENCOUNTER — Encounter (HOSPITAL_COMMUNITY)
Admission: RE | Admit: 2023-05-03 | Discharge: 2023-05-03 | Disposition: A | Discharge status: Home / Self Care | Payer: BC Managed Care – PPO | Source: Ambulatory Visit | Attending: Orthopedic Surgery | Admitting: Orthopedic Surgery

## 2023-05-03 ENCOUNTER — Telehealth: Payer: Self-pay | Admitting: Orthopedic Surgery

## 2023-05-03 VITALS — BP 117/74 | HR 75 | Temp 97.7°F | Resp 18 | Ht 66.0 in | Wt 214.1 lb

## 2023-05-03 DIAGNOSIS — Z01812 Encounter for preprocedural laboratory examination: Secondary | ICD-10-CM | POA: Insufficient documentation

## 2023-05-03 DIAGNOSIS — M1712 Unilateral primary osteoarthritis, left knee: Secondary | ICD-10-CM

## 2023-05-03 DIAGNOSIS — Z01818 Encounter for other preprocedural examination: Secondary | ICD-10-CM

## 2023-05-03 LAB — CBC WITH DIFFERENTIAL/PLATELET
Abs Immature Granulocytes: 0.03 10*3/uL (ref 0.00–0.07)
Basophils Absolute: 0 10*3/uL (ref 0.0–0.1)
Basophils Relative: 0 %
Eosinophils Absolute: 0.2 10*3/uL (ref 0.0–0.5)
Eosinophils Relative: 2 %
HCT: 41.8 % (ref 36.0–46.0)
Hemoglobin: 13.4 g/dL (ref 12.0–15.0)
Immature Granulocytes: 0 %
Lymphocytes Relative: 32 %
Lymphs Abs: 2.8 10*3/uL (ref 0.7–4.0)
MCH: 30.7 pg (ref 26.0–34.0)
MCHC: 32.1 g/dL (ref 30.0–36.0)
MCV: 95.7 fL (ref 80.0–100.0)
Monocytes Absolute: 0.6 10*3/uL (ref 0.1–1.0)
Monocytes Relative: 6 %
Neutro Abs: 5.3 10*3/uL (ref 1.7–7.7)
Neutrophils Relative %: 60 %
Platelets: 283 10*3/uL (ref 150–400)
RBC: 4.37 MIL/uL (ref 3.87–5.11)
RDW: 13.2 % (ref 11.5–15.5)
WBC: 8.9 10*3/uL (ref 4.0–10.5)
nRBC: 0 % (ref 0.0–0.2)

## 2023-05-03 LAB — TYPE AND SCREEN
ABO/RH(D): O POS
Antibody Screen: NEGATIVE

## 2023-05-03 LAB — BASIC METABOLIC PANEL
Anion gap: 8 (ref 5–15)
BUN: 14 mg/dL (ref 6–20)
CO2: 26 mmol/L (ref 22–32)
Calcium: 9.3 mg/dL (ref 8.9–10.3)
Chloride: 103 mmol/L (ref 98–111)
Creatinine, Ser: 0.74 mg/dL (ref 0.44–1.00)
GFR, Estimated: >60 mL/min (ref >60)
Glucose, Bld: 79 mg/dL (ref 70–99)
Potassium: 3.9 mmol/L (ref 3.5–5.1)
Sodium: 137 mmol/L (ref 135–145)

## 2023-05-03 LAB — SURGICAL PCR SCREEN
MRSA, PCR: NEGATIVE
Staphylococcus aureus: POSITIVE — AB

## 2023-05-03 LAB — PREPARE RBC (CROSSMATCH)

## 2023-05-03 NOTE — Telephone Encounter (Signed)
Records re-faxed [facility] [phone]

## 2023-05-04 ENCOUNTER — Telehealth: Payer: Self-pay | Admitting: Radiology

## 2023-05-04 NOTE — Telephone Encounter (Signed)
PCR positive for Staph

## 2023-05-04 NOTE — Progress Notes (Signed)
Patient positive for Staph.  Dr Harrison's office notified.

## 2023-05-06 ENCOUNTER — Telehealth: Payer: Self-pay

## 2023-05-06 NOTE — Telephone Encounter (Signed)
[facility] called and states patient contacted them with questions for our office:  - patient states her pain worsens after injecting enbrel for several days ( I will call patient to get more info on this).    - patient is scheduled for a total knee replacement on 05/11/2023 and her last enbrel injection was 04/30/2023. Patient would like to know how to proceed with taking the enbrel in regards to the surgery.   (Upon chart review, patient was advised on 03/28/2023 to contact you about instructions regarding enbrel and I do not see where patent contacted the office).   Please advise.

## 2023-05-06 NOTE — Telephone Encounter (Signed)
She should hold her next enbrel injection and for 2 doses after the knee surgery. I am not sure about the pain after injection, and I do not recall that problem discussed at our last visit in June. Thanks.

## 2023-05-06 NOTE — Telephone Encounter (Signed)
Spoke with patient and advised her that per Dr. Dimple Casey, She should hold her next enbrel injection and for 2 doses after the knee surgery. Patient verbalized understanding and also requested that I send that in a mychart message since she was driving. I have sent the message as well.   Patient states she is having increased joint pain and stiffness for 2-3 days after injecting enbrel and she noticed this in September. She denies joint swelling. Patient will contact the office after her surgery and when she resumes enbrel if she needs a sooner appointment to see you.

## 2023-05-10 NOTE — H&P (Signed)
Encounter Diagnosis  Name Primary?   Primary osteoarthritis of left knee Yes     PLANNED PROCEDURE: LEFT TKA      HPI: Julie Ryan is 57 years old she complains of worsening pain in her left knee   She is status post Roux-en-Y gastric bypass she has rheumatoid arthritis she is on phentermine and Enbrel   She has had hyaluronic acid injections as well as other treatments for her left knee pain without success   She indicates she wishes to proceed with left total  knee arthroplasty  Review of Systems  Constitutional:  Negative for fever.  Respiratory:  Negative for shortness of breath.   Cardiovascular:  Negative for chest pain.  Skin: Negative.   Neurological:  Negative for tingling and sensory change.  All other systems reviewed and are negative.  Past Medical History:  Diagnosis Date   Angio-edema    Anxiety    Arthritis    Phreesia 08/19/2020   Carpal tunnel syndrome of right wrist    Chronic knee pain    Depression    Endometrial polyp 08/09/2017   will get appt with Dr Despina Hidden   Fibroids 08/09/2017   Has multiple small fibroids    GERD (gastroesophageal reflux disease)    Hypertension    Neuropathy    Obesity    Prediabetes    Seizures (HCC)    1 seizure as a chold; unknown etiology and has never been on meds   Sleep apnea    Past Surgical History:  Procedure Laterality Date   APPENDECTOMY     CARPAL TUNNEL RELEASE Right 08/01/2015   Procedure: RIGHT CARPAL TUNNEL RELEASE;  Surgeon: Vickki Hearing, MD;  Location: [facility];  Service: Orthopedics;  Laterality: Right;   CERVICAL POLYPECTOMY  09/14/2017   Procedure: ENDOMETRIAL POLYPECTOMY;  Surgeon: Lazaro Arms, MD;  Location: [facility];  Service: Gynecology;;   COLONOSCOPY N/A 06/15/2017   Procedure: COLONOSCOPY;  Surgeon: Malissa Hippo, MD;  Location: [facility];  Service: Endoscopy;  Laterality: N/A;  830   ENDOMETRIAL ABLATION N/A 09/14/2017   Procedure: ENDOMETRIAL ABLATION( [place]);  Surgeon: Lazaro Arms, MD;  Location: [facility];  Service: Gynecology;  Laterality: N/A;   ESOPHAGOGASTRODUODENOSCOPY N/A 05/25/2013   Procedure: ESOPHAGOGASTRODUODENOSCOPY (EGD);  Surgeon: Malissa Hippo, MD;  Location: [facility];  Service: Endoscopy;  Laterality: N/A;  220   GASTRIC BYPASS     HYSTEROSCOPY WITH D & C N/A 09/14/2017   Procedure: DILATATION AND CURETTAGE /HYSTEROSCOPY;  Surgeon: Lazaro Arms, MD;  Location: [facility];  Service: Gynecology;  Laterality: N/A;   TUBAL LIGATION     Family History  Problem Relation Age of Onset   Hypertension Mother    Heart disease Mother    Diabetes Mother    Diabetes Sister    Multiple sclerosis Sister    Lupus Sister    Heart disease Sister    CVA Sister    Depression Sister    Diabetes Brother    Hypertension Brother    Other Son        brain tumor   Hypertension Daughter    Diabetes Sister    Social History   Tobacco Use   Smoking status: Former    Current packs/day: 0.00    Average packs/day: 0.5 packs/day for 10.0 years (5.0 ttl pk-yrs)    Types: Cigarettes    Start date: 05/13/2009    Quit date: 05/14/2019    Years since quitting: 3.9  Passive exposure: Current   Smokeless tobacco: Never  Vaping Use   Vaping status: Never Used  Substance Use Topics   Alcohol use: No    Alcohol/week: 0.0 standard drinks of alcohol   Drug use: No   Current Outpatient Medications  Medication Instructions   acetaminophen (TYLENOL) 1,000 mg, Oral, Every 6 hours PRN   amLODipine (NORVASC) 2.5 MG tablet TAKE 1 TABLET(2.5 MG) BY MOUTH DAILY   amLODipine (NORVASC) 5 MG tablet Take one tablet by mouth once daily for blood pressure   BIOTIN PO 1 tablet, Oral, Daily   diclofenac (VOLTAREN) 75 MG EC tablet TAKE 1 TABLET(75 MG) BY MOUTH TWICE DAILY WITH A MEAL   etanercept (ENBREL MINI) 50 MG/ML injection INSERT 1 MINI CARTRIDGE INTO AUTOINJECTOR AND INJECT 50 MG UNDER THE SKIN EVERY 7 DAYS   Ferrous Sulfate (IRON PO) 1 tablet, Oral, Daily   furosemide  (LASIX) 20 MG tablet Take one yablet twice weekly, as needed, for leg swelling   gabapentin (NEURONTIN) 300 MG capsule TAKE 1 CAPSULE(300 MG) BY MOUTH AT BEDTIME   methocarbamol (ROBAXIN) 500 mg, Oral, 2 times daily   metoprolol tartrate (LOPRESSOR) 25 MG tablet TAKE 1 TABLET(25 MG) BY MOUTH TWICE DAILY   phentermine (ADIPEX-P) 37.5 MG tablet Take one tablet by mouth once daily   potassium chloride SA (KLOR-CON M) 20 MEQ tablet TAKE 1 TABLET BY MOUTH 2 TIMES WEEKLY AS NEEDED WHEN TAKING FUROSEMIDE FOR LEG SWELLING   Probiotic Product (PROBIOTIC PO) Oral, Daily   traMADol (ULTRAM) 50 mg, Oral, 2 times daily   Vitamin D, Ergocalciferol, (DRISDOL) 1.25 MG (50000 UNIT) CAPS capsule TAKE 1 CAPSULE BY MOUTH 1 TIME A WEEK   Allergies  Allergen Reactions   Codeine Nausea Only and Other (See Comments)    Dizziness    Effexor Xr [Venlafaxine Hcl Er] Other (See Comments)    Makes patient feel on edge    Fluoxetine Other (See Comments)    States that med caused memory problems and crying spells    Hydrocodone Nausea Only and Other (See Comments)    Dizziness    Methotrexate Derivatives     Caused elevation in liver function tests   Latex Itching and Rash     Physical Exam Vitals and nursing note reviewed.  Constitutional:      Appearance: Normal appearance.  HENT:     Head: Normocephalic and atraumatic.  Eyes:     General: No scleral icterus.       Right eye: No discharge.        Left eye: No discharge.     Extraocular Movements: Extraocular movements intact.     Conjunctiva/sclera: Conjunctivae normal.     Pupils: Pupils are equal, round, and reactive to light.  Cardiovascular:     Rate and Rhythm: Normal rate.     Pulses: Normal pulses.  Skin:    General: Skin is warm and dry.     Capillary Refill: Capillary refill takes less than 2 seconds.  Neurological:     General: No focal deficit present.     Mental Status: She is alert and oriented to person, place, and time.   Psychiatric:        Mood and Affect: Mood normal.        Behavior: Behavior normal.        Thought Content: Thought content normal.        Judgment: Judgment normal.       Findings include slight varus alignment to  the left knee tenderness over the medial joint line flexion contracture less than 10 degrees knee flexion 105 degrees No instability Skin envelope looks normal   1 or 2 steps to get in the house, everything is on the same floor.  She has to be able to help her at home after surgery   Risks and benefits of surgery discussed including but not limited to potential risk of Stiffness because of her preop stiffness Deep vein thrombosis/DVT Infection   The procedure has been fully reviewed with the patient; The risks and benefits of surgery have been discussed and explained and understood. Alternative treatment has also been reviewed, questions were encouraged and answered. The postoperative plan is also been reviewed.   Problem list, medical hx, medications and allergies reviewed

## 2023-05-11 ENCOUNTER — Ambulatory Visit (HOSPITAL_COMMUNITY): Payer: BC Managed Care – PPO

## 2023-05-11 ENCOUNTER — Ambulatory Visit (HOSPITAL_COMMUNITY): Payer: BC Managed Care – PPO | Admitting: Anesthesiology

## 2023-05-11 ENCOUNTER — Other Ambulatory Visit: Payer: Self-pay

## 2023-05-11 ENCOUNTER — Encounter (HOSPITAL_COMMUNITY): Payer: Self-pay | Admitting: Orthopedic Surgery

## 2023-05-11 ENCOUNTER — Encounter (HOSPITAL_COMMUNITY)
Admission: RE | Disposition: A | Discharge status: Home Health | Payer: Self-pay | Source: Home / Self Care | Attending: Orthopedic Surgery

## 2023-05-11 ENCOUNTER — Observation Stay (HOSPITAL_COMMUNITY)
Admission: RE | Admit: 2023-05-11 | Discharge: 2023-05-12 | Disposition: A | Discharge status: Home Health | Payer: BC Managed Care – PPO | Attending: Orthopedic Surgery | Admitting: Orthopedic Surgery

## 2023-05-11 DIAGNOSIS — M1712 Unilateral primary osteoarthritis, left knee: Secondary | ICD-10-CM | POA: Diagnosis present

## 2023-05-11 DIAGNOSIS — Z91040 Latex allergy status: Secondary | ICD-10-CM | POA: Diagnosis not present

## 2023-05-11 DIAGNOSIS — I10 Essential (primary) hypertension: Secondary | ICD-10-CM | POA: Insufficient documentation

## 2023-05-11 DIAGNOSIS — Z87891 Personal history of nicotine dependence: Secondary | ICD-10-CM | POA: Diagnosis not present

## 2023-05-11 DIAGNOSIS — Z79899 Other long term (current) drug therapy: Secondary | ICD-10-CM | POA: Insufficient documentation

## 2023-05-11 HISTORY — PX: TOTAL KNEE ARTHROPLASTY: SHX125

## 2023-05-11 SURGERY — ARTHROPLASTY, KNEE, TOTAL
Anesthesia: General | Site: Knee | Laterality: Left

## 2023-05-11 MED ORDER — TRANEXAMIC ACID-NACL 1000-0.7 MG/100ML-% IV SOLN
INTRAVENOUS | Status: AC
  Administered 2023-05-11: 1000 mg via INTRAVENOUS
  Filled 2023-05-11: qty 100

## 2023-05-11 MED ORDER — DEXAMETHASONE SODIUM PHOSPHATE 4 MG/ML IJ SOLN
INTRAMUSCULAR | Status: DC | PRN
  Administered 2023-05-11: 8 mg via INTRAVENOUS

## 2023-05-11 MED ORDER — PREGABALIN 50 MG PO CAPS
ORAL_CAPSULE | ORAL | Status: AC
  Filled 2023-05-11: qty 1

## 2023-05-11 MED ORDER — ONDANSETRON HCL 4 MG/2ML IJ SOLN
4.0000 mg | Freq: Once | INTRAMUSCULAR | Status: AC | PRN
  Administered 2023-05-11: 4 mg via INTRAVENOUS

## 2023-05-11 MED ORDER — ACETAMINOPHEN 325 MG PO TABS: 325.0000 mg | ORAL_TABLET | Freq: Four times a day (QID) | ORAL | Status: DC | PRN

## 2023-05-11 MED ORDER — DOCUSATE SODIUM 100 MG PO CAPS
100.0000 mg | ORAL_CAPSULE | Freq: Two times a day (BID) | ORAL | Status: DC
  Administered 2023-05-11 – 2023-05-12 (×2): 100 mg via ORAL
  Filled 2023-05-11 (×2): qty 1

## 2023-05-11 MED ORDER — ONDANSETRON HCL 4 MG/2ML IJ SOLN
INTRAMUSCULAR | Status: AC
Start: 2023-05-11 — End: ?
  Filled 2023-05-11: qty 2

## 2023-05-11 MED ORDER — OXYCODONE HCL 5 MG PO TABS
5.0000 mg | ORAL_TABLET | ORAL | Status: DC
  Administered 2023-05-11 – 2023-05-12 (×4): 5 mg via ORAL
  Filled 2023-05-11 (×4): qty 1

## 2023-05-11 MED ORDER — OXYCODONE HCL 5 MG/5ML PO SOLN: 5.0000 mg | Freq: Once | ORAL | Status: AC | PRN

## 2023-05-11 MED ORDER — OXYCODONE HCL 5 MG PO TABS
10.0000 mg | ORAL_TABLET | ORAL | Status: DC | PRN
  Administered 2023-05-11 (×2): 10 mg via ORAL
  Filled 2023-05-11 (×2): qty 2

## 2023-05-11 MED ORDER — MENTHOL 3 MG MT LOZG: 1.0000 | LOZENGE | OROMUCOSAL | Status: DC | PRN

## 2023-05-11 MED ORDER — PREGABALIN 50 MG PO CAPS
50.0000 mg | ORAL_CAPSULE | Freq: Three times a day (TID) | ORAL | Status: DC
  Administered 2023-05-11: 50 mg via ORAL

## 2023-05-11 MED ORDER — HYDROMORPHONE HCL 1 MG/ML IJ SOLN
INTRAMUSCULAR | Status: AC
  Filled 2023-05-11: qty 1

## 2023-05-11 MED ORDER — VANCOMYCIN HCL 1000 MG IV SOLR
INTRAVENOUS | Status: DC | PRN
  Administered 2023-05-11: 1000 mg via INTRAVENOUS

## 2023-05-11 MED ORDER — TRAMADOL HCL 50 MG PO TABS
50.0000 mg | ORAL_TABLET | Freq: Four times a day (QID) | ORAL | Status: DC
  Administered 2023-05-11 – 2023-05-12 (×4): 50 mg via ORAL
  Filled 2023-05-11 (×4): qty 1

## 2023-05-11 MED ORDER — PHENYLEPHRINE HCL-NACL 20-0.9 MG/250ML-% IV SOLN
INTRAVENOUS | Status: DC | PRN
  Administered 2023-05-11: 25 ug/min via INTRAVENOUS

## 2023-05-11 MED ORDER — TRANEXAMIC ACID-NACL 1000-0.7 MG/100ML-% IV SOLN
1000.0000 mg | INTRAVENOUS | Status: AC
  Administered 2023-05-11: 1000 mg via INTRAVENOUS

## 2023-05-11 MED ORDER — PROPOFOL 10 MG/ML IV BOLUS
INTRAVENOUS | Status: DC | PRN
  Administered 2023-05-11 (×2): 30 mg via INTRAVENOUS

## 2023-05-11 MED ORDER — GLYCOPYRROLATE 0.2 MG/ML IJ SOLN
INTRAMUSCULAR | Status: DC | PRN
  Administered 2023-05-11: .2 mg via INTRAVENOUS

## 2023-05-11 MED ORDER — VANCOMYCIN HCL IN DEXTROSE 1-5 GM/200ML-% IV SOLN
1000.0000 mg | Freq: Two times a day (BID) | INTRAVENOUS | Status: AC
  Administered 2023-05-11: 1000 mg via INTRAVENOUS
  Filled 2023-05-11: qty 200

## 2023-05-11 MED ORDER — HYDROMORPHONE HCL 1 MG/ML IJ SOLN
0.2500 mg | INTRAMUSCULAR | Status: DC | PRN
  Administered 2023-05-11 (×4): 0.5 mg via INTRAVENOUS
  Filled 2023-05-11 (×2): qty 0.5

## 2023-05-11 MED ORDER — FUROSEMIDE 20 MG PO TABS
20.0000 mg | ORAL_TABLET | Freq: Two times a day (BID) | ORAL | Status: DC
  Administered 2023-05-12: 20 mg via ORAL
  Filled 2023-05-11: qty 1

## 2023-05-11 MED ORDER — DEXAMETHASONE SODIUM PHOSPHATE 10 MG/ML IJ SOLN
10.0000 mg | Freq: Once | INTRAMUSCULAR | Status: AC
  Administered 2023-05-12: 10 mg via INTRAVENOUS
  Filled 2023-05-11: qty 1

## 2023-05-11 MED ORDER — PREGABALIN 50 MG PO CAPS
50.0000 mg | ORAL_CAPSULE | Freq: Three times a day (TID) | ORAL | Status: DC
  Administered 2023-05-11 – 2023-05-12 (×2): 50 mg via ORAL
  Filled 2023-05-11 (×2): qty 1

## 2023-05-11 MED ORDER — VANCOMYCIN HCL IN DEXTROSE 1-5 GM/200ML-% IV SOLN: 1000.0000 mg | Freq: Once | INTRAVENOUS | Status: DC

## 2023-05-11 MED ORDER — 0.9 % SODIUM CHLORIDE (POUR BTL) OPTIME
TOPICAL | Status: DC | PRN
  Administered 2023-05-11: 1000 mL

## 2023-05-11 MED ORDER — TRANEXAMIC ACID-NACL 1000-0.7 MG/100ML-% IV SOLN
1000.0000 mg | Freq: Once | INTRAVENOUS | Status: AC
  Filled 2023-05-11: qty 100

## 2023-05-11 MED ORDER — POVIDONE-IODINE 10 % EX SWAB
2.0000 | Freq: Once | CUTANEOUS | Status: AC
  Administered 2023-05-11: 2 via TOPICAL

## 2023-05-11 MED ORDER — BUPIVACAINE-MELOXICAM ER 200-6 MG/7ML IJ SOLN
INTRAMUSCULAR | Status: AC
  Filled 2023-05-11: qty 2

## 2023-05-11 MED ORDER — CHLORHEXIDINE GLUCONATE 0.12 % MT SOLN: 15.0000 mL | Freq: Once | OROMUCOSAL | Status: DC

## 2023-05-11 MED ORDER — FENTANYL CITRATE PF 50 MCG/ML IJ SOSY
25.0000 ug | PREFILLED_SYRINGE | INTRAMUSCULAR | Status: DC | PRN
Start: 2023-05-11 — End: 2023-05-11
  Administered 2023-05-11 (×3): 50 ug via INTRAVENOUS
  Filled 2023-05-11 (×3): qty 1

## 2023-05-11 MED ORDER — PROPOFOL 500 MG/50ML IV EMUL
INTRAVENOUS | Status: DC | PRN
  Administered 2023-05-11: 100 ug/kg/min via INTRAVENOUS

## 2023-05-11 MED ORDER — CELECOXIB 400 MG PO CAPS
ORAL_CAPSULE | ORAL | Status: AC
Start: 2023-05-11 — End: ?
  Filled 2023-05-11: qty 1

## 2023-05-11 MED ORDER — EPHEDRINE SULFATE (PRESSORS) 50 MG/ML IJ SOLN
INTRAMUSCULAR | Status: DC | PRN
  Administered 2023-05-11: 10 mg via INTRAVENOUS
  Administered 2023-05-11: 5 mg via INTRAVENOUS
  Administered 2023-05-11: 10 mg via INTRAVENOUS

## 2023-05-11 MED ORDER — MIDAZOLAM HCL 2 MG/2ML IJ SOLN
INTRAMUSCULAR | Status: AC
  Filled 2023-05-11: qty 2

## 2023-05-11 MED ORDER — BUPIVACAINE IN DEXTROSE 0.75-8.25 % IT SOLN
INTRATHECAL | Status: DC | PRN
  Administered 2023-05-11: 1.6 mL via INTRATHECAL

## 2023-05-11 MED ORDER — CELECOXIB 100 MG PO CAPS
200.0000 mg | ORAL_CAPSULE | Freq: Every day | ORAL | Status: DC
  Administered 2023-05-11 – 2023-05-12 (×2): 200 mg via ORAL
  Filled 2023-05-11 (×2): qty 2

## 2023-05-11 MED ORDER — METOCLOPRAMIDE HCL 10 MG PO TABS: 5.0000 mg | ORAL_TABLET | Freq: Three times a day (TID) | ORAL | Status: DC | PRN

## 2023-05-11 MED ORDER — OXYCODONE HCL 5 MG PO TABS: 5.0000 mg | ORAL_TABLET | ORAL | Status: DC | PRN

## 2023-05-11 MED ORDER — AMLODIPINE BESYLATE 5 MG PO TABS
7.5000 mg | ORAL_TABLET | Freq: Every day | ORAL | Status: DC
  Administered 2023-05-11: 7.5 mg via ORAL
  Filled 2023-05-11: qty 2

## 2023-05-11 MED ORDER — METOCLOPRAMIDE HCL 5 MG/ML IJ SOLN: 5.0000 mg | Freq: Three times a day (TID) | INTRAMUSCULAR | Status: DC | PRN

## 2023-05-11 MED ORDER — ORAL CARE MOUTH RINSE: 15.0000 mL | Freq: Once | OROMUCOSAL | Status: DC

## 2023-05-11 MED ORDER — POLYETHYLENE GLYCOL 3350 17 G PO PACK: 17.0000 g | PACK | Freq: Every day | ORAL | Status: DC | PRN

## 2023-05-11 MED ORDER — LACTATED RINGERS IV SOLN: INTRAVENOUS | Status: DC

## 2023-05-11 MED ORDER — DEXAMETHASONE SODIUM PHOSPHATE 10 MG/ML IJ SOLN
INTRAMUSCULAR | Status: AC
  Filled 2023-05-11: qty 1

## 2023-05-11 MED ORDER — PROBIOTIC PO TBEC: DELAYED_RELEASE_TABLET | Freq: Every day | ORAL | Status: DC

## 2023-05-11 MED ORDER — PHENOL 1.4 % MT LIQD: 1.0000 | OROMUCOSAL | Status: DC | PRN

## 2023-05-11 MED ORDER — MIDAZOLAM HCL 5 MG/5ML IJ SOLN
INTRAMUSCULAR | Status: DC | PRN
  Administered 2023-05-11: 2 mg via INTRAVENOUS

## 2023-05-11 MED ORDER — STERILE WATER FOR IRRIGATION IR SOLN
Status: DC | PRN
  Administered 2023-05-11: 1000 mL

## 2023-05-11 MED ORDER — DEXTROSE-SODIUM CHLORIDE 5-0.45 % IV SOLN: INTRAVENOUS | Status: DC

## 2023-05-11 MED ORDER — ONDANSETRON HCL 4 MG PO TABS: 4.0000 mg | ORAL_TABLET | Freq: Four times a day (QID) | ORAL | Status: DC | PRN

## 2023-05-11 MED ORDER — ONDANSETRON HCL 4 MG/2ML IJ SOLN: 4.0000 mg | Freq: Four times a day (QID) | INTRAMUSCULAR | Status: DC | PRN

## 2023-05-11 MED ORDER — GABAPENTIN 300 MG PO CAPS: 300.0000 mg | ORAL_CAPSULE | Freq: Every day | ORAL | Status: DC

## 2023-05-11 MED ORDER — ROPIVACAINE HCL 5 MG/ML IJ SOLN
INTRAMUSCULAR | Status: AC
  Filled 2023-05-11: qty 30

## 2023-05-11 MED ORDER — METHOCARBAMOL 1000 MG/10ML IJ SOLN
INTRAMUSCULAR | Status: AC
  Filled 2023-05-11: qty 10

## 2023-05-11 MED ORDER — ONDANSETRON HCL 4 MG/2ML IJ SOLN
INTRAMUSCULAR | Status: AC
  Filled 2023-05-11: qty 2

## 2023-05-11 MED ORDER — POTASSIUM CHLORIDE CRYS ER 10 MEQ PO TBCR
10.0000 meq | EXTENDED_RELEASE_TABLET | Freq: Two times a day (BID) | ORAL | Status: DC
  Administered 2023-05-12: 10 meq via ORAL
  Filled 2023-05-11: qty 1

## 2023-05-11 MED ORDER — HYDROMORPHONE HCL 1 MG/ML IJ SOLN: 0.5000 mg | INTRAMUSCULAR | Status: DC | PRN

## 2023-05-11 MED ORDER — OXYCODONE HCL 5 MG PO TABS
ORAL_TABLET | ORAL | Status: AC
  Filled 2023-05-11: qty 1

## 2023-05-11 MED ORDER — FENTANYL CITRATE (PF) 100 MCG/2ML IJ SOLN
INTRAMUSCULAR | Status: AC
  Filled 2023-05-11: qty 2

## 2023-05-11 MED ORDER — METOPROLOL TARTRATE 25 MG PO TABS
12.5000 mg | ORAL_TABLET | Freq: Two times a day (BID) | ORAL | Status: DC
  Administered 2023-05-12: 12.5 mg via ORAL
  Filled 2023-05-11: qty 1

## 2023-05-11 MED ORDER — AMLODIPINE BESYLATE 5 MG PO TABS: 5.0000 mg | ORAL_TABLET | Freq: Every day | ORAL | Status: DC

## 2023-05-11 MED ORDER — METHOCARBAMOL 500 MG PO TABS
500.0000 mg | ORAL_TABLET | Freq: Two times a day (BID) | ORAL | Status: DC
  Administered 2023-05-11 – 2023-05-12 (×2): 500 mg via ORAL
  Filled 2023-05-11 (×2): qty 1

## 2023-05-11 MED ORDER — ONDANSETRON HCL 4 MG/2ML IJ SOLN: 4.0000 mg | Freq: Four times a day (QID) | INTRAMUSCULAR | Status: DC

## 2023-05-11 MED ORDER — VANCOMYCIN HCL IN DEXTROSE 1-5 GM/200ML-% IV SOLN
INTRAVENOUS | Status: AC
  Filled 2023-05-11: qty 200

## 2023-05-11 MED ORDER — ONDANSETRON HCL 4 MG/2ML IJ SOLN
INTRAMUSCULAR | Status: DC | PRN
  Administered 2023-05-11: 4 mg via INTRAVENOUS

## 2023-05-11 MED ORDER — METHOCARBAMOL 1000 MG/10ML IJ SOLN
500.0000 mg | Freq: Four times a day (QID) | INTRAMUSCULAR | Status: DC
  Administered 2023-05-11: 500 mg via INTRAVENOUS

## 2023-05-11 MED ORDER — LACTATED RINGERS IV SOLN: INTRAVENOUS | Status: DC | PRN

## 2023-05-11 MED ORDER — SODIUM CHLORIDE 0.9 % IR SOLN
Status: DC | PRN
  Administered 2023-05-11: 3000 mL

## 2023-05-11 MED ORDER — PHENTERMINE HCL 37.5 MG PO TABS: 37.5000 mg | ORAL_TABLET | Freq: Every day | ORAL | Status: DC

## 2023-05-11 MED ORDER — OXYCODONE HCL 5 MG PO TABS
5.0000 mg | ORAL_TABLET | Freq: Once | ORAL | Status: AC | PRN
  Administered 2023-05-11: 5 mg via ORAL

## 2023-05-11 MED ORDER — ASPIRIN 81 MG PO CHEW
81.0000 mg | CHEWABLE_TABLET | Freq: Two times a day (BID) | ORAL | Status: DC
  Administered 2023-05-11 – 2023-05-12 (×2): 81 mg via ORAL
  Filled 2023-05-11 (×2): qty 1

## 2023-05-11 MED ORDER — BUPIVACAINE-MELOXICAM ER 200-6 MG/7ML IJ SOLN
INTRAMUSCULAR | Status: DC | PRN
  Administered 2023-05-11: 400 mg

## 2023-05-11 MED ORDER — PROPOFOL 10 MG/ML IV BOLUS
INTRAVENOUS | Status: AC
  Filled 2023-05-11: qty 20

## 2023-05-11 SURGICAL SUPPLY — 70 items
ATTUNE PSFEM LTSZ4 NARCEM KNEE (Femur) IMPLANT
BANDAGE ESMARK 6X9 LF (GAUZE/BANDAGES/DRESSINGS) ×1 IMPLANT
BASEPLATE TIB CMT FB PCKT SZ4 (Stem) IMPLANT
BLADE SAGITTAL 25.0X1.27X90 (BLADE) ×1 IMPLANT
BLADE SAW SGTL 11.0X1.19X90.0M (BLADE) ×1 IMPLANT
BLADE SURG SZ10 CARB STEEL (BLADE) ×1 IMPLANT
BNDG CMPR 9X6 STRL LF SNTH (GAUZE/BANDAGES/DRESSINGS) ×1
BNDG CMPR STD VLCR NS LF 5.8X4 (GAUZE/BANDAGES/DRESSINGS) ×2
BNDG CMPR STD VLCR NS LF 5.8X6 (GAUZE/BANDAGES/DRESSINGS) ×1
BNDG ELASTIC 4X5.8 VLCR NS LF (GAUZE/BANDAGES/DRESSINGS) ×2 IMPLANT
BNDG ELASTIC 6X5.8 VLCR NS LF (GAUZE/BANDAGES/DRESSINGS) ×1 IMPLANT
BNDG ESMARK 6X9 LF (GAUZE/BANDAGES/DRESSINGS) ×1
BSPLAT TIB 4 CMNT FX BRNG STRL (Stem) ×1 IMPLANT
CEMENT HV SMART SET (Cement) ×2 IMPLANT
CLOTH BEACON ORANGE TIMEOUT ST (SAFETY) ×1 IMPLANT
COOLER ICEMAN CLASSIC (MISCELLANEOUS) ×1 IMPLANT
COVER LIGHT HANDLE STERIS (MISCELLANEOUS) ×2 IMPLANT
CUFF TOURN SGL QUICK 34 (TOURNIQUET CUFF)
CUFF TOURN SGL QUICK 42 (TOURNIQUET CUFF) IMPLANT
CUFF TRNQT CYL 34X4.125X (TOURNIQUET CUFF) ×1 IMPLANT
DECANTER SPIKE VIAL GLASS SM (MISCELLANEOUS) IMPLANT
DRAPE BACK TABLE (DRAPES) ×1 IMPLANT
DRAPE EXTREMITY T 121X128X90 (DISPOSABLE) ×1 IMPLANT
DRESSING AQUACEL AG ADV 3.5X12 (MISCELLANEOUS) ×1 IMPLANT
DRSG AQUACEL AG ADV 3.5X12 (MISCELLANEOUS) ×1
DURAPREP 26ML APPLICATOR (WOUND CARE) ×2 IMPLANT
ELECT REM PT RETURN 9FT ADLT (ELECTROSURGICAL) ×1
ELECTRODE REM PT RTRN 9FT ADLT (ELECTROSURGICAL) ×1 IMPLANT
GLOVE BIOGEL PI IND STRL 7.0 (GLOVE) ×6 IMPLANT
GLOVE BIOGEL PI IND STRL 8.5 (GLOVE) ×1 IMPLANT
GLOVE SKINSENSE STRL SZ8.0 LF (GLOVE) ×1 IMPLANT
GLOVE SURG SS PI 8.0 STRL IVOR (GLOVE) IMPLANT
GOWN STRL REUS W/TWL LRG LVL3 (GOWN DISPOSABLE) ×3 IMPLANT
GOWN STRL REUS W/TWL XL LVL3 (GOWN DISPOSABLE) ×1 IMPLANT
HANDPIECE INTERPULSE COAX TIP (DISPOSABLE) ×1
HOOD PEEL AWAY T7 (MISCELLANEOUS) ×4 IMPLANT
INSERT TIB ATTUNE FB SZ4X5 (Insert) IMPLANT
INST SET MAJOR BONE (KITS) ×1 IMPLANT
IV NS IRRIG 3000ML ARTHROMATIC (IV SOLUTION) ×1 IMPLANT
KIT BLADEGUARD II DBL (SET/KITS/TRAYS/PACK) ×1 IMPLANT
KIT TURNOVER KIT A (KITS) ×1 IMPLANT
MANIFOLD NEPTUNE II (INSTRUMENTS) ×1 IMPLANT
MARKER SKIN DUAL TIP RULER LAB (MISCELLANEOUS) ×1 IMPLANT
NDL HYPO 18GX1.5 BLUNT FILL (NEEDLE) IMPLANT
NDL HYPO 21X1.5 SAFETY (NEEDLE) IMPLANT
NEEDLE HYPO 18GX1.5 BLUNT FILL (NEEDLE)
NEEDLE HYPO 21X1.5 SAFETY (NEEDLE)
NS IRRIG 1000ML POUR BTL (IV SOLUTION) ×1 IMPLANT
PACK TOTAL JOINT (CUSTOM PROCEDURE TRAY) ×1 IMPLANT
PAD ARMBOARD 7.5X6 YLW CONV (MISCELLANEOUS) ×1 IMPLANT
PAD COLD SHLDR SM WRAP-ON (PAD) ×1 IMPLANT
PATELLA MEDIAL ATTUN 35MM KNEE (Knees) IMPLANT
PILLOW KNEE EXTENSION 0 DEG (MISCELLANEOUS) ×1 IMPLANT
PIN/DRILL PACK ORTHO 1/8X3.0 (PIN) ×1 IMPLANT
POSITIONER HEAD 8X9X4 ADT (SOFTGOODS) ×1 IMPLANT
SAW OSC TIP CART 19.5X105X1.3 (SAW) ×1 IMPLANT
SET BASIN LINEN APH (SET/KITS/TRAYS/PACK) ×1 IMPLANT
SET HNDPC FAN SPRY TIP SCT (DISPOSABLE) ×1 IMPLANT
SOLUTION IRRIG SURGIPHOR (IV SOLUTION) ×1 IMPLANT
SPONGE T-LAP 18X18 ~~LOC~~+RFID (SPONGE) IMPLANT
STAPLER SKIN PROX WIDE 3.9 (STAPLE) ×1 IMPLANT
SUT BRALON NAB BRD #1 30IN (SUTURE) ×1 IMPLANT
SUT MNCRL 0 VIOLET CTX 36 (SUTURE) ×1 IMPLANT
SUT MON AB 0 CT1 (SUTURE) ×1 IMPLANT
SYR BULB IRRIG 60ML STRL (SYRINGE) ×1 IMPLANT
TOWEL OR 17X26 4PK STRL BLUE (TOWEL DISPOSABLE) ×1 IMPLANT
TOWER CARTRIDGE SMART MIX (DISPOSABLE) ×1 IMPLANT
TRAY FOLEY MTR SLVR 16FR STAT (SET/KITS/TRAYS/PACK) ×1 IMPLANT
WATER STERILE IRR 1000ML POUR (IV SOLUTION) ×2 IMPLANT
YANKAUER SUCT 12FT TUBE ARGYLE (SUCTIONS) ×1 IMPLANT

## 2023-05-11 NOTE — Evaluation (Signed)
Physical Therapy Evaluation Patient Details Name: Julie Ryan MRN: [id number] DOB: 23-Feb-1966 Today's Date: 05/11/2023   LEFT KNEE ROM: 0 - 105 degrees AMBULATION DISTANCE: 55 feet using RW with Contact guard assist   History of Present Illness  Julie Ryan is a 57 y/o female, s/p Left TKA on 05/11/23, with the diagnosis of left knee osteoarthritis  Clinical Impression  Patient has difficulty moving LLE during bed mobility, able to achieve 105 degrees left knee self stretching and ambulated in hallway without loss of balance with fair return for left heel to toe stepping.  Patient tolerated sitting up in chair after therapy with LLE dangling.  Patient will benefit from continued skilled physical therapy in hospital and recommended venue below to increase strength, balance, endurance for safe ADLs and gait.          If plan is discharge home, recommend the following: A little help with walking and/or transfers;A little help with bathing/dressing/bathroom;Help with stairs or ramp for entrance;Assistance with cooking/housework   Can travel by private vehicle        Equipment Recommendations None recommended by PT  Recommendations for Other Services       Functional Status Assessment Patient has had a recent decline in their functional status and demonstrates the ability to make significant improvements in function in a reasonable and predictable amount of time.     Precautions / Restrictions Precautions Precautions: Fall Restrictions Weight Bearing Restrictions: Yes LLE Weight Bearing: Weight bearing as tolerated      Mobility  Bed Mobility Overal bed mobility: Needs Assistance Bed Mobility: Supine to Sit     Supine to sit: Contact guard     General bed mobility comments: had difficulty moving LLE during supine to sitting    Transfers Overall transfer level: Needs assistance Equipment used: Rolling walker (2 wheels) Transfers: Sit to/from Stand, Bed to  chair/wheelchair/BSC Sit to Stand: Supervision, Contact guard assist   Step pivot transfers: Supervision, Contact guard assist       General transfer comment: increased time, labored movement    Ambulation/Gait Ambulation/Gait assistance: Contact guard assist Gait Distance (Feet): 55 Feet Assistive device: Rolling walker (2 wheels) Gait Pattern/deviations: Decreased step length - right, Decreased step length - left, Decreased stride length, Antalgic Gait velocity: decreased     General Gait Details: slow labored cadence with fair carryover for left heel to toe stepping without loss of balance, limited moslty due to fatigue and left knee pain  Stairs            Wheelchair Mobility     Tilt Bed    Modified Rankin (Stroke Patients Only)       Balance Overall balance assessment: Needs assistance Sitting-balance support: Feet supported, No upper extremity supported Sitting balance-Leahy Scale: Fair Sitting balance - Comments: fair/good seated at EOB   Standing balance support: Reliant on assistive device for balance, During functional activity, Bilateral upper extremity supported Standing balance-Leahy Scale: Fair Standing balance comment: using RW                             Pertinent Vitals/Pain Pain Assessment Pain Assessment: 0-10 Pain Score: 7  Pain Location: left knee Pain Descriptors / Indicators: Sore, Grimacing, Discomfort Pain Intervention(s): Limited activity within patient's tolerance, Monitored during session, Premedicated before session, Repositioned    Home Living Family/patient expects to be discharged to:: Private residence Living Arrangements: Children;Other relatives Available Help at Discharge: Family;Available 24 hours/day  Type of Home: House (townhouse) Home Access: Stairs to enter   [employer] of Steps: 2   Home Layout: One level Home Equipment: Agricultural consultant (2 wheels);Cane - single point      Prior  Function Prior Level of Function : Independent/Modified Independent;Driving             Mobility Comments: Retail buyer SPC, drives ADLs Comments: Independent     Extremity/Trunk Assessment   Upper Extremity Assessment Upper Extremity Assessment: Overall WFL for tasks assessed    Lower Extremity Assessment Lower Extremity Assessment: Generalized weakness;LLE deficits/detail LLE Deficits / Details: grossly -4/5 LLE: Unable to fully assess due to pain LLE Sensation: WNL LLE Coordination: WNL    Cervical / Trunk Assessment Cervical / Trunk Assessment: Normal  Communication   Communication Communication: No apparent difficulties  Cognition Arousal: Alert Behavior During Therapy: WFL for tasks assessed/performed Overall Cognitive Status: Within Functional Limits for tasks assessed                                          General Comments      Exercises Total Joint Exercises Ankle Circles/Pumps: Supine, 10 reps, Left, Strengthening, AROM Quad Sets: AROM, Strengthening, Left, 10 reps, Supine Short Arc Quad: AROM, Strengthening, Left, 10 reps, Supine Heel Slides: AROM, Strengthening, Left, 10 reps, Supine Goniometric ROM: left knee ROM: 0 - 105   Assessment/Plan    PT Assessment Patient needs continued PT services  PT Problem List Decreased strength;Decreased activity tolerance;Decreased balance;Decreased range of motion;Decreased mobility       PT Treatment Interventions DME instruction;Gait training;Stair training;Functional mobility training;Therapeutic activities;Therapeutic exercise;Balance training;Patient/family education    PT Goals (Current goals can be found in the Care Plan section)  Acute Rehab PT Goals Patient Stated Goal: return home with family to assist PT Goal Formulation: With patient Time For Goal Achievement: 05/13/23 Potential to Achieve Goals: Good    Frequency BID     Co-evaluation                AM-PAC PT "6 Clicks" Mobility  Outcome Measure Help needed turning from your back to your side while in a flat bed without using bedrails?: None Help needed moving from lying on your back to sitting on the side of a flat bed without using bedrails?: A Little Help needed moving to and from a bed to a chair (including a wheelchair)?: A Little Help needed standing up from a chair using your arms (e.g., wheelchair or bedside chair)?: A Little Help needed to walk in hospital room?: A Little Help needed climbing 3-5 steps with a railing? : A Lot 6 Click Score: 18    End of Session   Activity Tolerance: Patient tolerated treatment well;Patient limited by fatigue Patient left: in chair;with call bell/phone within reach Nurse Communication: Mobility status PT Visit Diagnosis: Unsteadiness on feet (R26.81);Other abnormalities of gait and mobility (R26.89);Muscle weakness (generalized) (M62.81)    Time: 1610-9604 PT Time Calculation (min) (ACUTE ONLY): 34 min   Charges:   PT Evaluation $PT Eval Moderate Complexity: 1 Mod PT Treatments $Therapeutic Activity: 23-37 mins PT General Charges $$ ACUTE PT VISIT: 1 Visit         4:18 PM, 05/11/23 Ocie Bob, MPT Physical Therapist with [facility] [phone] office [phone] mobile phone

## 2023-05-11 NOTE — Plan of Care (Signed)
  Problem: Acute Rehab PT Goals(only PT should resolve) Goal: Pt Will Go Supine/Side To Sit Outcome: Progressing Flowsheets (Taken 05/11/2023 1620) Pt will go Supine/Side to Sit: with modified independence Goal: Patient Will Transfer Sit To/From Stand Outcome: Progressing Flowsheets (Taken 05/11/2023 1620) Patient will transfer sit to/from stand: with modified independence Goal: Pt Will Transfer Bed To Chair/Chair To Bed Outcome: Progressing Flowsheets (Taken 05/11/2023 1620) Pt will Transfer Bed to Chair/Chair to Bed: with modified independence Goal: Pt Will Ambulate Outcome: Progressing Flowsheets (Taken 05/11/2023 1620) Pt will Ambulate:  > 125 feet  with supervision  with modified independence   4:20 PM, 05/11/23 Ocie Bob, MPT Physical Therapist with [facility] [phone] office [phone] mobile phone

## 2023-05-11 NOTE — Progress Notes (Signed)
Message left on [name]'s (daughter) phone of pt's room #[phone].

## 2023-05-11 NOTE — Transfer of Care (Signed)
Immediate Anesthesia Transfer of Care Note  Patient: Julie Ryan  Procedure(s) Performed: TOTAL KNEE ARTHROPLASTY (Left: Knee)  Patient Location: PACU  Anesthesia Type:MAC combined with regional for post-op pain  Level of Consciousness: awake and alert   Airway & Oxygen Therapy: Patient Spontanous Breathing and Patient connected to nasal cannula oxygen  Post-op Assessment: Report given to RN and Post -op Vital signs reviewed and stable  Post vital signs: Reviewed and stable  Last Vitals:  Vitals Value Taken Time  BP    Temp    Pulse    Resp    SpO2      Last Pain:  Vitals:   05/11/23 0914  TempSrc: Oral  PainSc: 0-No pain         Complications: No notable events documented.

## 2023-05-11 NOTE — Anesthesia Procedure Notes (Signed)
Spinal  Patient location during procedure: OR Start time: 05/11/2023 10:50 AM Reason for block: surgical anesthesia Staffing Performed: resident/CRNA  Resident/CRNA: Deri Fuelling, CRNA Performed by: Deri Fuelling, CRNA Authorized by: Windell Norfolk, MD   Preanesthetic Checklist Completed: patient identified, IV checked, site marked, risks and benefits discussed, surgical consent, monitors and equipment checked, pre-op evaluation and timeout performed Spinal Block Patient position: sitting Prep: DuraPrep Patient monitoring: heart rate, cardiac monitor, continuous pulse ox and blood pressure Approach: midline Location: L3-4 Injection technique: single-shot Needle Needle type: Pencan  Needle gauge: 24 G Needle length: 9 cm Assessment Sensory level: T10 Events: CSF return

## 2023-05-11 NOTE — Anesthesia Preprocedure Evaluation (Signed)
Anesthesia Evaluation  Patient identified by MRN, date of birth, ID band Patient awake    Reviewed: Allergy & Precautions, H&P , NPO status , Patient's Chart, lab work & pertinent test results, reviewed documented beta blocker date and time   Airway Mallampati: II  TM Distance: >3 FB Neck ROM: full    Dental no notable dental hx.    Pulmonary neg pulmonary ROS, shortness of breath, sleep apnea , pneumonia, former smoker   Pulmonary exam normal breath sounds clear to auscultation       Cardiovascular Exercise Tolerance: Good hypertension, negative cardio ROS  Rhythm:regular Rate:Normal     Neuro/Psych Seizures -,  PSYCHIATRIC DISORDERS Anxiety Depression     Neuromuscular disease negative neurological ROS  negative psych ROS   GI/Hepatic negative GI ROS, Neg liver ROS, hiatal hernia, PUD,GERD  ,,  Endo/Other  negative endocrine ROS    Renal/GU negative Renal ROS  negative genitourinary   Musculoskeletal   Abdominal   Peds  Hematology negative hematology ROS (+)   Anesthesia Other Findings   Reproductive/Obstetrics negative OB ROS                             Anesthesia Physical Anesthesia Plan  ASA: 3  Anesthesia Plan: General and Spinal   Post-op Pain Management:    Induction:   PONV Risk Score and Plan: Propofol infusion  Airway Management Planned:   Additional Equipment:   Intra-op Plan:   Post-operative Plan:   Informed Consent: I have reviewed the patients History and Physical, chart, labs and discussed the procedure including the risks, benefits and alternatives for the proposed anesthesia with the patient or authorized representative who has indicated his/her understanding and acceptance.     Dental Advisory Given  Plan Discussed with: CRNA  Anesthesia Plan Comments:        Anesthesia Quick Evaluation

## 2023-05-11 NOTE — Op Note (Signed)
Dictation for total knee replacement  05/11/2023  1:00 PM  Orthopaedic Surgery Operative Note (CSN: [id number])  Julie Ryan  [birth year] Date of Surgery: 05/11/2023   Diagnoses:  left knee osteoarthritis  Procedure: Left Total knee arthroplasty   Operative Finding Moderate varus deformity  Grade 4 wear medial tibial plateau medial femoral condyle all 3 joints and osteophytes including the notch  ACL PCL were intact lateral meniscus was completely intact medial meniscus had degenerative wear  Patellofemoral joint grade 3 disease   Post-Op Diagnosis: Same Surgeons:Primary: Vickki Hearing, MD Assistants: Mina Marble Cox Location: [facility] OR ROOM 4 Anesthesia: Spinal anesthesia plus postop saphenous nerve block Antibiotics: Vancomycin due to positive PCR staff preop Tourniquet time:  Total Tourniquet Time Documented: Thigh (Left) - 91 minutes Total: Thigh (Left) - 91 minutes    Estimated Blood Loss: 50 cc Complications: None Specimens: None   Implants: Implant Name Type Inv. Item Serial No. Manufacturer Lot No. LRB No. Used Action  CEMENT HV SMART SET - [id number] Cement CEMENT HV SMART SET  [employer] [id number] Left 1 Implanted  CEMENT HV SMART SET - [id number] Cement CEMENT HV SMART SET  [employer] [id number] Left 1 Implanted  BSPLAT TIB 4 CMNT FX BRNG STRL - [id number] Stem BSPLAT TIB 4 CMNT FX BRNG STRL  [employer] [id number] Left 1 Implanted  INSERT TIB ATTUNE FB SZ4X5 - [id number] Insert INSERT TIB ATTUNE FB SZ4X5  [employer]PU[employer] [id number] Left 1 Implanted  PATELLA MEDIAL ATTUN KNEE - [id number] Knees PATELLA MEDIAL ATTUN KNEE  DEPU[employer] ORTH[employer] [id number] Left 1 Implanted  ATTUNE FEMORAL POSTERIOR STABILIZED NARROW    [employer]PU[employer] [id number] Left 1 Implanted     Indications for Surgery:   Julie Ryan is a 57 y.o. female who failed nonoperative care for her severe arthritis in the left knee and opted for surgical  intervention with total knee replacement  .  Benefits and risks of operative and nonoperative management were discussed prior to surgery with patient/guardian(s) and informed consent form was completed.  While all risks cannot be anticipated, specific risks including infection, need for additional surgery, stiffness, postop pain, infection, implant removal, loosening, infection requiring amputation, deep vein thrombosis, pulmonary embolus were discussed.   Procedure:     Details of surgery: The patient was identified by 2 approved identification mechanisms. The operative extremity was evaluated and found to be acceptable for surgical treatment today. The chart was reviewed. The surgical site was confirmed and marked. The patient had a saphenous nerve block postoperatively  The patient was taken to the operating room and given appropriate antibiotic vancomycin. This is consistent with the SCIP protocol note preop positive staph  The patient was given the following anesthetic: Spinal plus postop saphenous nerve block  The patient was then placed supine on the operating table. A Foley catheter was inserted. The operative extremity was prepped and draped sterilely from the toes to the groin.  Timeout was executed confirming the patient's name, surgical site, antibiotic administration, x-rays available, and implants available.  The operative limb,  was exsanguinated with a six-inch Esmarch and the tourniquet was inflated to 300 mmHg.  A straight midline incision was made over the left KNEE and taken down to the extensor mechanism. A medial arthrotomy was performed. The patella was everted and the patellofemoral soft tissue was released, along with the patellar fat pad.  The anterior cruciate ligament and PCL were resected.  The anterior horns of the lateral and medial meniscus  were resected. The medial soft tissue sleeve was elevated to the mid coronal plane.  A three-eighths inch drill bit was  used to enter the femoral canal which was decompressed with suction and irrigation until clear.   The distal femoral cutting guide was set for 9 mm distal resection,  5valgus alignment, for a left knee. The distal femur was resected and checked for flatness.  The attune sizing femoral guide was placed and the femur was preliminarily sized to a size 4.   The external alignment guide for the tibial resection was then applied to the distal and proximal tibia and set for anatomic slope along with 9 MM resection  from the higher lateral side after removing the cartilage on the tibial plateau.   Rotational alignment was set using the malleolus, the tibial tubercle and the tibial spines.  The proximal tibia was resected along with  residual menisci. The tibia was sized using a base plate to a size 4.   The extension gap was checked.  It balanced with a 5   A 4-in-1 cutting block was placed along with collateral ligament retractors.  Our target was proper rotation based on the epicondylar axis using Whitesides line as a guide as well.  Once the block was pinned in place we took the spacer block that balanced extension gap and placed it on top of the tibia under the femoral block.  Correcting for rotation we pinned the femoral block the 5 spacer was perfect   Once I was satisfied with the spacer block collateral ligament retractors were placed and I completed the 4 distal femoral cuts   The extension gap and flexion gap was rechecked with a 5 block and excellent stability no rotational lift off in flexion   The correct sized notch cutting guide for the femur was then applied and the notch cut was made.  Trial reduction was completed using size 4 femur 4 tibia 5 poly trial implants. Patella tracking was normal  We then skeletonized the patella. It measured 21 in thickness and the patellar resection was set for 7 5 millimeters. the patellar resection was completed. The patella diameter measured  35. We then drilled the peg holes for the patella  The proximal tibia was prepared using the size 4 base plate.  Thorough irrigation was performed using saline  and the bone was dried and prepared for cement. The cement was mixed on the back table using third generation preparation techniques  The implants were then cemented in place and excess cement was removed. The cement was allowed to cure. Surgipor irrigation was placed followed by saline irrigation  Any excess bone fragments and cement were removed  Final passive flexion test showed flexion of 115 degrees with full extension noted  The extensor mechanism was closed with #1 Bralon suture followed by subcutaneous tissue closure using 0 Monocryl suture in 2 layers  Zynrelef a total of 2 vials were injected prior to complete extensor mechanism closure.  The openings and the capsule were then closed with #1 Braylon   Skin approximation was performed using staples  A sterile dressing was applied, TED hose were placed on the operative extremity followed by Cryo/Cuff.  The patient was taken recovery room in stable condition  Postop plan: Weightbearing as tolerated CPM machine Immediate physical therapy Discharge tomorrow if stable

## 2023-05-11 NOTE — Brief Op Note (Signed)
05/11/2023  12:59 PM  PATIENT:  Julie Ryan  57 y.o. female  PRE-OPERATIVE DIAGNOSIS:  left knee osteoarthritis  POST-OPERATIVE DIAGNOSIS:  left knee osteoarthritis  PROCEDURE:  Procedure(s): TOTAL KNEE ARTHROPLASTY (Left)  SURGEON:  Surgeons and Role:    Vickki Hearing, MD - Primary  PHYSICIAN ASSISTANT:   ASSISTANTS: Canary Brim and Romie Levee  ANESTHESIA:      Spinal and postop saphenous nerve block EBL:  50 mL   BLOOD ADMINISTERED:none  DRAINS: none   LOCAL MEDICATIONS USED:  OTHER Zen relief  SPECIMEN:  No Specimen  DISPOSITION OF SPECIMEN:  N/A  COUNTS:  YES  TOURNIQUET:   Total Tourniquet Time Documented: Thigh (Left) - 91 minutes Total: Thigh (Left) - 91 minutes   DICTATION: .Reubin Milan Dictation  PLAN OF CARE: Admit for overnight observation  PATIENT DISPOSITION:  PACU - hemodynamically stable.   Delay start of Pharmacological VTE agent (>24hrs) due to surgical blood loss or risk of bleeding: yes

## 2023-05-11 NOTE — Interval H&P Note (Signed)
History and Physical Interval Note:  05/11/2023 10:09 AM  Julie Ryan  has presented today for surgery, with the diagnosis of left knee osteoarthritis.  The various methods of treatment have been discussed with the patient and family. After consideration of risks, benefits and other options for treatment, the patient has consented to  Procedure(s): TOTAL KNEE ARTHROPLASTY (Left) as a surgical intervention.  The patient's history has been reviewed, patient examined, no change in status, stable for surgery.  I have reviewed the patient's chart and labs.  Questions were answered to the patient's satisfaction.     Fuller Canada

## 2023-05-12 DIAGNOSIS — M1712 Unilateral primary osteoarthritis, left knee: Secondary | ICD-10-CM | POA: Diagnosis not present

## 2023-05-12 LAB — CBC
HCT: 38.8 % (ref 36.0–46.0)
Hemoglobin: 12.8 g/dL (ref 12.0–15.0)
MCH: 31.8 pg (ref 26.0–34.0)
MCHC: 33 g/dL (ref 30.0–36.0)
MCV: 96.3 fL (ref 80.0–100.0)
Platelets: 299 10*3/uL (ref 150–400)
RBC: 4.03 MIL/uL (ref 3.87–5.11)
RDW: 13 % (ref 11.5–15.5)
WBC: 11.1 10*3/uL — ABNORMAL HIGH (ref 4.0–10.5)
nRBC: 0 % (ref 0.0–0.2)

## 2023-05-12 LAB — BASIC METABOLIC PANEL
Anion gap: 9 (ref 5–15)
BUN: 11 mg/dL (ref 6–20)
CO2: 28 mmol/L (ref 22–32)
Calcium: 9.5 mg/dL (ref 8.9–10.3)
Chloride: 99 mmol/L (ref 98–111)
Creatinine, Ser: 0.79 mg/dL (ref 0.44–1.00)
GFR, Estimated: >60 mL/min (ref >60)
Glucose, Bld: 145 mg/dL — ABNORMAL HIGH (ref 70–99)
Potassium: 4.9 mmol/L (ref 3.5–5.1)
Sodium: 136 mmol/L (ref 135–145)

## 2023-05-12 MED ORDER — ASPIRIN 81 MG PO CHEW: 81.0000 mg | CHEWABLE_TABLET | Freq: Two times a day (BID) | ORAL | 0 refills | Status: DC

## 2023-05-12 MED ORDER — DOCUSATE SODIUM 100 MG PO CAPS: 100.0000 mg | ORAL_CAPSULE | Freq: Two times a day (BID) | ORAL | 0 refills | Status: DC

## 2023-05-12 MED ORDER — OXYCODONE-ACETAMINOPHEN 5-325 MG PO TABS: 1.0000 | ORAL_TABLET | ORAL | 0 refills | Status: DC | PRN

## 2023-05-12 MED ORDER — TRAMADOL HCL 50 MG PO TABS: 50.0000 mg | ORAL_TABLET | Freq: Four times a day (QID) | ORAL | 0 refills | Status: DC | PRN

## 2023-05-12 MED ORDER — POLYETHYLENE GLYCOL 3350 17 G PO PACK: 17.0000 g | PACK | Freq: Every day | ORAL | 0 refills | Status: DC | PRN

## 2023-05-12 NOTE — Plan of Care (Signed)
  Problem: Activity: Goal: Risk for activity intolerance will decrease Outcome: Progressing   Problem: Nutrition: Goal: Adequate nutrition will be maintained Outcome: Progressing   Problem: Pain Management: Goal: General experience of comfort will improve Outcome: Progressing

## 2023-05-12 NOTE — Progress Notes (Signed)
Physical Therapy Treatment Patient Details Name: Julie Ryan MRN: [id number] DOB: [birth year] Today's Date: 05/12/2023   LEFT KNEE ROM: 0 - 108 degrees AMBULATION DISTANCE: 100 feet using RW with Modified Independence   History of Present Illness Julie Ryan is a 57 y/o female, s/p Left TKA on 05/11/23, with the diagnosis of left knee osteoarthritis    PT Comments  Patient demonstrates good return for going up/down steps in stairwell with understanding acknowledged, increased endurance/distance for gait training and tolerated sitting up in chair with LLE dangling after therapy.  Patient will benefit from continued skilled physical therapy in hospital and recommended venue below to increase strength, balance, endurance for safe ADLs and gait.     If plan is discharge home, recommend the following: A little help with walking and/or transfers;A little help with bathing/dressing/bathroom;Help with stairs or ramp for entrance;Assistance with cooking/housework   Can travel by private vehicle        Equipment Recommendations  None recommended by PT    Recommendations for Other Services       Precautions / Restrictions Precautions Precautions: Fall Restrictions Weight Bearing Restrictions: Yes LLE Weight Bearing: Weight bearing as tolerated     Mobility  Bed Mobility Overal bed mobility: Modified Independent             General bed mobility comments: fair/good return for moving LLE during bed mobility    Transfers Overall transfer level: Modified independent                 General transfer comment: increased LLE strength for completing sit to stands/transfers    Ambulation/Gait Ambulation/Gait assistance: Modified independent (Device/Increase time) Gait Distance (Feet): 100 Feet Assistive device: Rolling walker (2 wheels) Gait Pattern/deviations: Decreased step length - right, Decreased step length - left, Decreased stride length, Antalgic Gait velocity:  decreased     General Gait Details: increased endurance/distance with fair/good return for left heel to toe stepping without loss of balance, limited mostly due to fatigue and mild increase in left knee pain   Stairs Stairs: Yes Stairs assistance: Supervision, Contact guard assist Stair Management: One rail Right Number of Stairs: 3 General stair comments: demonstrates good return for going up/down steps using both hands on 1 siderail and understanding acknowledged   Wheelchair Mobility     Tilt Bed    Modified Rankin (Stroke Patients Only)       Balance Overall balance assessment: Needs assistance Sitting-balance support: Feet supported, No upper extremity supported Sitting balance-Leahy Scale: Good Sitting balance - Comments: seated at EOB   Standing balance support: Reliant on assistive device for balance, During functional activity, Bilateral upper extremity supported Standing balance-Leahy Scale: Fair Standing balance comment: fair/good using RW                            Cognition Arousal: Alert Behavior During Therapy: WFL for tasks assessed/performed Overall Cognitive Status: Within Functional Limits for tasks assessed                                          Exercises Total Joint Exercises Ankle Circles/Pumps: Supine, 10 reps, Left, Strengthening, AROM Quad Sets: AROM, Strengthening, Left, 10 reps, Supine Short Arc Quad: AROM, Strengthening, Left, 10 reps, Supine Heel Slides: AROM, Strengthening, Left, 10 reps, Supine Goniometric ROM: Left knee ROM: 0 - 108 degrees  General Comments        Pertinent Vitals/Pain Pain Assessment Pain Assessment: 0-10 Pain Score: 7  Pain Location: left knee Pain Descriptors / Indicators: Sore, Grimacing, Discomfort    Home Living Family/patient expects to be discharged to:: Private residence                        Prior Function            PT Goals (current goals can  now be found in the care plan section) Acute Rehab PT Goals Patient Stated Goal: return home with family to assist PT Goal Formulation: With patient Time For Goal Achievement: 05/13/23 Potential to Achieve Goals: Good Progress towards PT goals: Progressing toward goals    Frequency    BID      PT Plan      Co-evaluation              AM-PAC PT "6 Clicks" Mobility   Outcome Measure  Help needed turning from your back to your side while in a flat bed without using bedrails?: None Help needed moving from lying on your back to sitting on the side of a flat bed without using bedrails?: None Help needed moving to and from a bed to a chair (including a wheelchair)?: A Little Help needed standing up from a chair using your arms (e.g., wheelchair or bedside chair)?: A Little Help needed to walk in hospital room?: A Little Help needed climbing 3-5 steps with a railing? : A Little 6 Click Score: 20    End of Session   Activity Tolerance: Patient tolerated treatment well;Patient limited by fatigue Patient left: in chair;with call bell/phone within reach Nurse Communication: Mobility status PT Visit Diagnosis: Unsteadiness on feet (R26.81);Other abnormalities of gait and mobility (R26.89);Muscle weakness (generalized) (M62.81)     Time: 0826-0902 PT Time Calculation (min) (ACUTE ONLY): 36 min  Charges:    $Gait Training: 8-22 mins $Therapeutic Exercise: 8-22 mins PT General Charges $$ ACUTE PT VISIT: 1 Visit                     9:46 AM, 05/12/23 Ocie Bob, MPT Physical Therapist with [facility] 336 [phone] office [phone] mobile phone

## 2023-05-12 NOTE — Discharge Summary (Signed)
Physician Discharge Summary  Patient ID: Julie Ryan MRN: [id number] DOB/AGE: [birth year] 57 y.o.  Admit date: 05/11/2023 Discharge date: 05/12/2023  Admission Diagnoses: Osteoarthritis left knee  Discharge Diagnoses:  Principal Problem:   Osteoarthritis of left knee Active Problems:   Primary osteoarthritis of left knee   Discharged Condition: good  Hospital Course: Unremarkable     Latest Ref Rng & Units 05/12/2023    4:18 AM 05/03/2023   10:38 AM 12/13/2022   11:46 AM  CBC  WBC 4.0 - 10.5 K/uL 11.1  8.9  8.5   Hemoglobin 12.0 - 15.0 g/dL 25.3  66.4  40.3   Hematocrit 36.0 - 46.0 % 38.8  41.8  42.2   Platelets 150 - 400 K/uL 299  283  315        Latest Ref Rng & Units 05/12/2023    4:18 AM 05/03/2023   10:38 AM 12/13/2022   11:46 AM  BMP  Glucose 70 - 99 mg/dL 474  79  91   BUN 6 - 20 mg/dL 11  14  20    Creatinine 0.44 - 1.00 mg/dL 2.59  5.63  8.75   BUN/Creat Ratio 6 - 22 (calc)   SEE NOTE:   Sodium 135 - 145 mmol/L 136  137  139   Potassium 3.5 - 5.1 mmol/L 4.9  3.9  4.8   Chloride 98 - 111 mmol/L 99  103  101   CO2 22 - 32 mmol/L 28  26  31    Calcium 8.9 - 10.3 mg/dL 9.5  9.3  9.8     Discharge Exam: Blood pressure (!) 118/54, pulse 66, temperature 97.6 F (36.4 C), temperature source Oral, resp. rate 19, height 5\' 6"  (1.676 m), weight 97.1 kg, last menstrual period 08/31/2017, SpO2 99%. Normal exam today  No status excellent  Neurovascular exam normal  Calf soft supple  Dressing dry  Disposition: Discharge disposition: 01-Home or Self Care       Discharge Instructions     Call MD / Call 911   Complete by: As directed    If you experience chest pain or shortness of breath, CALL 911 and be transported to the hospital emergency room.  If you develope a fever above 101 F, pus (white drainage) or increased drainage or redness at the wound, or calf pain, call your surgeon's office.   Constipation Prevention   Complete by: As directed    Drink  plenty of fluids.  Prune juice may be helpful.  You may use a stool softener, such as Colace (over the counter) 100 mg twice a day.  Use MiraLax (over the counter) for constipation as needed.   Diet - low sodium heart healthy   Complete by: As directed    Discharge instructions   Complete by: As directed    You do not have to change her dressing unless it becomes soaked all the way through.  It is waterproof.  Exercises as taught in the hospital with therapy  Weightbearing status as tolerated  Ice is much as possible  Blue foam pillow 30 minutes 3 times a day  CPM machine if approved by insurance 4 hours a day started 0 to 90 degrees increase 10 degrees/day as tolerated.  Use the CPM for 2 weeks and then call the company to pick it up   Increase activity slowly as tolerated   Complete by: As directed    Post-operative opioid taper instructions:   Complete by: As directed  POST-OPERATIVE OPIOID TAPER INSTRUCTIONS: It is important to wean off of your opioid medication as soon as possible. If you do not need pain medication after your surgery it is ok to stop day one. Opioids include: Codeine, Hydrocodone(Norco, Vicodin), Oxycodone(Percocet, oxycontin) and hydromorphone amongst others.  Long term and even short term use of opiods can cause: Increased pain response Dependence Constipation Depression Respiratory depression And more.  Withdrawal symptoms can include Flu like symptoms Nausea, vomiting And more Techniques to manage these symptoms Hydrate well Eat regular healthy meals Stay active Use relaxation techniques(deep breathing, meditating, yoga) Do Not substitute Alcohol to help with tapering If you have been on opioids for less than two weeks and do not have pain than it is ok to stop all together.  Plan to wean off of opioids This plan should start within one week post op of your joint replacement. Maintain the same interval or time between taking each dose and first  decrease the dose.  Cut the total daily intake of opioids by one tablet each day Next start to increase the time between doses. The last dose that should be eliminated is the evening dose.         Allergies as of 05/12/2023       Reactions   Codeine Nausea Only, Other (See Comments)   Dizziness    Effexor Xr [venlafaxine Hcl Er] Other (See Comments)   Makes patient feel on edge    Fluoxetine Other (See Comments)   States that med caused memory problems and crying spells    Hydrocodone Nausea Only, Other (See Comments)   Dizziness    Methotrexate Derivatives    Caused elevation in liver function tests   Latex Itching, Rash        Medication List     TAKE these medications    acetaminophen 500 MG tablet Commonly known as: TYLENOL Take 1,000 mg by mouth every 6 (six) hours as needed for moderate pain (pain score 4-6).   amLODipine 5 MG tablet Commonly known as: NORVASC Take one tablet by mouth once daily for blood pressure What changed:  how much to take how to take this when to take this additional instructions   amLODipine 2.5 MG tablet Commonly known as: NORVASC TAKE 1 TABLET(2.5 MG) BY MOUTH DAILY What changed: See the new instructions.   aspirin 81 MG chewable tablet Chew 1 tablet (81 mg total) by mouth 2 (two) times daily.   BIOTIN PO Take 1 tablet by mouth daily.   diclofenac 75 MG EC tablet Commonly known as: VOLTAREN TAKE 1 TABLET(75 MG) BY MOUTH TWICE DAILY WITH A MEAL   docusate sodium 100 MG capsule Commonly known as: COLACE Take 1 capsule (100 mg total) by mouth 2 (two) times daily.   Enbrel Mini 50 MG/ML injection Generic drug: etanercept INSERT 1 MINI CARTRIDGE INTO AUTOINJECTOR AND INJECT 50 MG UNDER THE SKIN EVERY 7 DAYS   furosemide 20 MG tablet Commonly known as: LASIX Take one yablet twice weekly, as needed, for leg swelling   gabapentin 300 MG capsule Commonly known as: NEURONTIN TAKE 1 CAPSULE(300 MG) BY MOUTH AT BEDTIME    IRON PO Take 1 tablet by mouth daily.   methocarbamol 500 MG tablet Commonly known as: ROBAXIN Take 500 mg by mouth 2 (two) times daily.   metoprolol tartrate 25 MG tablet Commonly known as: LOPRESSOR TAKE 1 TABLET(25 MG) BY MOUTH TWICE DAILY   oxyCODONE-acetaminophen 5-325 MG tablet Commonly known as: Percocet Take 1 tablet  by mouth every 4 (four) hours as needed for severe pain (pain score 7-10).   phentermine 37.5 MG tablet Commonly known as: ADIPEX-P Take one tablet by mouth once daily   polyethylene glycol 17 g packet Commonly known as: MIRALAX / GLYCOLAX Take 17 g by mouth daily as needed for mild constipation.   potassium chloride SA 20 MEQ tablet Commonly known as: KLOR-CON M TAKE 1 TABLET BY MOUTH 2 TIMES WEEKLY AS NEEDED WHEN TAKING FUROSEMIDE FOR LEG SWELLING   PROBIOTIC PO Take by mouth daily.   traMADol 50 MG tablet Commonly known as: ULTRAM Take 1 tablet (50 mg total) by mouth every 6 (six) hours as needed. What changed:  when to take this reasons to take this   Vitamin D (Ergocalciferol) 1.25 MG (50000 UNIT) Caps capsule Commonly known as: DRISDOL TAKE 1 CAPSULE BY MOUTH 1 TIME A WEEK        Follow-up Information     Vickki Hearing, MD Follow up on 05/26/2023.   Specialties: Orthopedic Surgery, Radiology Why: For wound re-check, For suture removal Contact information: [address] [place] [address] [phone]                 Signed: Fuller Canada 05/12/2023, 7:53 AM

## 2023-05-12 NOTE — Progress Notes (Addendum)
Foley catheter removed at 0520.

## 2023-05-12 NOTE — Progress Notes (Signed)
Patient ID: Julie Ryan, female   DOB: 10-Feb-1966, 57 y.o.   MRN: [id number] Postop day 1 left total knee  BP (!) 118/54 (BP Location: Right Arm)   Pulse 66   Temp 97.6 F (36.4 C) (Oral)   Resp 19   Ht 5\' 6"  (1.676 m)   Wt 97.1 kg   LMP 08/31/2017 (Approximate)   SpO2 99%   BMI 34.55 kg/m   Looks good neurovascular exam is intact she is awake and alert no signs of DVT dressing is clean discharge later today

## 2023-05-12 NOTE — TOC Transition Note (Signed)
Transition of Care [facility]) - CM/SW Discharge Note   Patient Details  Name: Julie Ryan MRN: [id number] Date of Birth: [birth year]  [facility] CM/SW Contact:  Villa Herb, LCSWA Phone Number: 05/12/2023, 12:54 PM   Clinical Narrative:    [facility] consulted for needs. However, CSW updated by Clifton Custard with [facility] that [facility] services have been prearranged by Dr. Mort Sawyers office. TOC signing off.   Final next level of care: Home w Home Health Services Barriers to Discharge: No Barriers Identified   Patient Goals and CMS Choice      Discharge Placement                         Discharge Plan and Services Additional resources added to the After Visit Summary for                                       Social Determinants of Health (SDOH) Interventions SDOH Screenings   Food Insecurity: No Food Insecurity (05/11/2023)  Recent Concern: Food Insecurity - Food Insecurity Present (03/03/2023)  Housing: Low Risk  (05/11/2023)  Transportation Needs: No Transportation Needs (05/11/2023)  Utilities: Not At Risk (05/11/2023)  Alcohol Screen: Low Risk  (01/05/2023)  Depression (PHQ2-9): Medium Risk (04/14/2023)  Financial Resource Strain: High Risk (03/03/2023)  Physical Activity: Inactive (03/03/2023)  Social Connections: Moderately Isolated (03/03/2023)  Stress: Stress Concern Present (03/03/2023)  Tobacco Use: Medium Risk (05/11/2023)     Readmission Risk Interventions     No data to display

## 2023-05-14 ENCOUNTER — Other Ambulatory Visit: Payer: Self-pay | Admitting: Family Medicine

## 2023-05-14 NOTE — Anesthesia Postprocedure Evaluation (Signed)
Anesthesia Post Note  Patient: Julie Ryan  Procedure(s) Performed: TOTAL KNEE ARTHROPLASTY (Left: Knee)  Patient location during evaluation: Phase II Anesthesia Type: Spinal Level of consciousness: awake Pain management: pain level controlled Vital Signs Assessment: post-procedure vital signs reviewed and stable Respiratory status: spontaneous breathing and respiratory function stable Cardiovascular status: blood pressure returned to baseline and stable Postop Assessment: no headache and no apparent nausea or vomiting Anesthetic complications: no Comments: Late entry   No notable events documented.   Last Vitals:  Vitals:   05/12/23 0341 05/12/23 1255  BP: (!) 118/54 126/74  Pulse: 66 (!) 104  Resp: 19   Temp: 36.4 C 36.9 C  SpO2: 99% 98%    Last Pain:  Vitals:   05/12/23 0800  TempSrc:   PainSc: 7                  Windell Norfolk

## 2023-05-16 ENCOUNTER — Ambulatory Visit (HOSPITAL_COMMUNITY): Payer: BC Managed Care – PPO

## 2023-05-16 ENCOUNTER — Telehealth: Payer: Self-pay | Admitting: Orthopedic Surgery

## 2023-05-16 NOTE — Telephone Encounter (Signed)
DR. Jacqlyn Larsen with [facility] called and needs a verbal approval for PT   She did not leave a phone number to call back

## 2023-05-17 ENCOUNTER — Encounter (HOSPITAL_COMMUNITY): Payer: Self-pay | Admitting: Orthopedic Surgery

## 2023-05-17 NOTE — Telephone Encounter (Signed)
Email sent to Monia Pouch from [facility] for a callback to get orders.

## 2023-05-17 NOTE — Telephone Encounter (Signed)
If you have a way to get there we can try to get you a new pair at [employer]. The hose go with the Aspirin to prevent blood clots. You will need them for four weeks.

## 2023-05-24 NOTE — Therapy (Signed)
OUTPATIENT PHYSICAL THERAPY LOWER EXTREMITY EVALUATION   Patient Name: Julie Ryan MRN: [id number] DOB:[birth year], 57 y.o., female Today's Date: 05/25/2023  END OF SESSION:  PT End of Session - 05/25/23 1300     Visit Number 1    Number of Visits 13    Date for PT Re-Evaluation 07/06/23    Progress Note Due on Visit 10    PT Start Time 1300    PT Stop Time 1338    PT Time Calculation (min) 38 min    Activity Tolerance Patient tolerated treatment well    Behavior During Therapy [facility] for tasks assessed/performed             Past Medical History:  Diagnosis Date   Angio-edema    Anxiety    Arthritis    Phreesia 08/19/2020   Carpal tunnel syndrome of right wrist    Chronic knee pain    Depression    Endometrial polyp 08/09/2017   will get appt with Dr Julie Ryan   Fibroids 08/09/2017   Has multiple small fibroids    GERD (gastroesophageal reflux disease)    Hypertension    Neuropathy    Obesity    Prediabetes    Seizures (HCC)    1 seizure as a chold; unknown etiology and has never been on meds   Sleep apnea    Past Surgical History:  Procedure Laterality Date   APPENDECTOMY     CARPAL TUNNEL RELEASE Right 08/01/2015   Procedure: RIGHT CARPAL TUNNEL RELEASE;  Surgeon: Julie Hearing, MD;  Location: [facility];  Service: Orthopedics;  Laterality: Right;   CERVICAL POLYPECTOMY  09/14/2017   Procedure: ENDOMETRIAL POLYPECTOMY;  Surgeon: Lazaro Arms, MD;  Location: [facility];  Service: Gynecology;;   COLONOSCOPY N/A 06/15/2017   Procedure: COLONOSCOPY;  Surgeon: Julie Hippo, MD;  [facility];  Service: Endoscopy;  Laterality: N/A;  830   ENDOMETRIAL ABLATION N/A 09/14/2017   Procedure: ENDOMETRIAL ABLATION( [place]);  Surgeon: Lazaro Arms, MD;  Location: [facility];  Service: Gynecology;  Laterality: N/A;   ESOPHAGOGASTRODUODENOSCOPY N/A 05/25/2013   Procedure: ESOPHAGOGASTRODUODENOSCOPY (EGD);  Surgeon: Julie Hippo, MD;  [facility];   Service: Endoscopy;  Laterality: N/A;  220   GASTRIC BYPASS     HYSTEROSCOPY WITH D & C N/A 09/14/2017   Procedure: DILATATION AND CURETTAGE /HYSTEROSCOPY;  Surgeon: Lazaro Arms, MD;  Location: [facility];  Service: Gynecology;  Laterality: N/A;   TOTAL KNEE ARTHROPLASTY Left 05/11/2023   Procedure: TOTAL KNEE ARTHROPLASTY;  Surgeon: Julie Hearing, MD;  Location: [facility];  Service: Orthopedics;  Laterality: Left;   TUBAL LIGATION     Patient Active Problem List   Diagnosis Date Noted   Primary osteoarthritis of left knee 05/11/2023   Osteoarthritis of left knee 05/11/2023   Acute cervical adenitis 04/14/2023   COVID-19 04/04/2023   Low serum vitamin B12 12/22/2022   Shortness of breath 07/06/2022   Multifocal pneumonia 05/13/2022   Bilateral primary osteoarthritis of knee 06/02/2021   Angioedema 01/01/2021   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 factor (HCC) 08/28/2020   High risk medication use 08/28/2020   Neck pain 08/19/2020   Dermatomycosis 07/01/2020   Encounter for screening fecal occult blood testing 02/26/2020   Encounter for gynecological examination with Papanicolaou smear of cervix 02/26/2020   Knee instability, left 09/06/2019   Hiatal hernia 07/17/2019   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   Back spasm 04/12/2018  Endometrial polyp 08/09/2017   Fibroids 08/09/2017   Vaginal dryness 07/18/2017   Dyslipidemia 06/27/2017   Leg edema 03/08/2017   GAD (generalized anxiety disorder) 06/01/2016   Metabolic syndrome X 07/08/2013   Erosive esophagitis 06/25/2013   GERD (gastroesophageal reflux disease) 03/22/2013   Left knee pain 03/22/2013   Snoring 08/05/2011   Urinary incontinence 08/05/2011   Vitamin D deficiency 03/21/2011   Allergic rhinitis 09/20/2010   Back pain 03/17/2010   MORBID OBESITY 02/11/2010   Essential hypertension 02/11/2010    PCP: Julie Perches, MD   REFERRING PROVIDER: Vickki Hearing, MD    REFERRING DIAG: [phone] (ICD-10-CM) - Primary osteoarthritis of left knee  THERAPY DIAG:  Status post total left knee replacement  Muscle weakness (generalized)  Stiffness of left knee, not elsewhere classified  Rationale for Evaluation and Treatment: Rehabilitation  ONSET DATE: 05/11/23  SUBJECTIVE:   SUBJECTIVE STATEMENT: Patient reports she was receiving HH until Monday 11/18. She reports they were working on stretches and exercises. Lives with her daughter and sister. Has been starting to utilize the cane for mobility but does not like to use for longer distances currently.   PERTINENT HISTORY: S/pp L TKA on 05/11/23  PAIN:  Are you having pain? Yes: NPRS scale: 9-10/10 Pain location: L knee with bending  Pain description: sharp    PRECAUTIONS: Knee and Fall  RED FLAGS: None   WEIGHT BEARING RESTRICTIONS: No  FALLS:  Has patient fallen in last 6 months? 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  DIAGNOSTIC FINDINGS: N/A   PATIENT SURVEYS:  LEFS 42.5% (34/80)  COGNITION: Overall cognitive status: Within functional limits for tasks assessed     SENSATION: WFL  POSTURE: rounded shoulders and forward head  PALPATION: Unable to assess knee incision 2/2 pant style   LOWER EXTREMITY ROM:  Active ROM Right eval Left eval  Hip flexion    Hip extension    Hip abduction    Hip adduction    Hip internal rotation    Hip external rotation    Knee flexion  68  Knee extension  0  Ankle dorsiflexion     Ankle plantarflexion    Ankle inversion    Ankle eversion     (Blank rows = not tested)  LOWER EXTREMITY MMT:  MMT Right eval Left eval  Hip flexion WFL   Hip extension    Hip abduction [facility]   Hip adduction    Hip internal rotation    Hip external rotation    Knee flexion WFL   Knee extension WFL   Ankle dorsiflexion WFL   Ankle plantarflexion    Ankle inversion    Ankle eversion     (Blank rows = not tested)  -->14 degree extension lag with SLR   FUNCTIONAL TESTS:  5 times sit to stand: 36.75 seconds 2 minute walk test: to be completed visit #2   GAIT: Distance walked: 30' Assistive device utilized: Single point cane Level of assistance: Complete Independence Comments: decreased stance time on L, good  heel strike noted    TODAY'S TREATMENT:                                                                                                                              DATE: 05/25/23    PATIENT EDUCATION:  Education details: HEP, POC, goals  Person educated: Patient Education method: Medical illustrator Education comprehension: verbalized understanding and returned demonstration  HOME EXERCISE PROGRAM: Encouraged continued performance of HEP given by HHPT after review of exercises provided  ASSESSMENT:  CLINICAL IMPRESSION: Patient is a 57 y.o. female who was seen today for physical therapy evaluation and treatment s/p L TKA. Patient demonstrates impairments consistent with post op including ROM, strength, and activity tolerance. Able to achieve 0-68 degrees of L knee ROM. Encouraged continued participation in HEP given by HHPT. Patient will to benefit from skilled PT interventions to address deficits in order to improve quality of life and return to PLOF.      OBJECTIVE IMPAIRMENTS: Abnormal gait, decreased activity tolerance, decreased endurance, decreased mobility, difficulty walking, decreased ROM, decreased strength, hypomobility, impaired  flexibility, and pain.   ACTIVITY LIMITATIONS: squatting, sleeping, stairs, and locomotion level  PARTICIPATION LIMITATIONS: cleaning, laundry, driving, community activity, and yard work  PERSONAL FACTORS: Age, Past/current experiences, and Time since onset of injury/illness/exacerbation are also affecting patient's functional outcome.   REHAB POTENTIAL: Good  CLINICAL DECISION MAKING: Stable/uncomplicated  EVALUATION COMPLEXITY: Low   GOALS: Goals reviewed with patient? Yes  SHORT TERM GOALS: Target date: 06/14/2023  Patient will be independent in HEP to improve strength/mobility for better functional independence with ADLs. Baseline: Goal status: INITIAL  LONG TERM GOALS: Target date: 07/05/2023   Patient will increase lower extremity functional scale to >60/80 to demonstrate improved functional mobility and increased tolerance with ADLs.  Baseline: 11/20: 42.5% Goal status: INITIAL  2.  Patient will increase BLE gross strength to 4+/5 as to improve functional strength for independent gait, increased standing tolerance and increased ADL ability. Baseline:  Goal status: INITIAL  3.  Patient will demonstrates SLR with 0 degree extension lag to demonstrate improved quad strength.  Baseline: 11/20: 14 degree extension lag  Goal status: INITIAL  4.   Patient (< 34 years old) will complete five times sit to stand test in < 10 seconds indicating an increased LE strength and improved balance. Baseline: 11/20: 36.75 seconds Goal status: INITIAL  5.  Patient will demonstrate L knee AROM symmetrical to R knee for improve gait ability and overall mobility.  Baseline: 11/20: see above  Goal status: INITIAL   PLAN:  PT FREQUENCY:  1-3x/week  PT DURATION: 6 weeks  PLANNED INTERVENTIONS: 97164- PT Re-evaluation, 97110-Therapeutic exercises, 97530- Therapeutic activity, 97112- Neuromuscular re-education, 97535- Self Care, 29528- Manual therapy, [id number]- Gait training, 97014-  Electrical stimulation (unattended), [phone]- Electrical stimulation (manual), Patient/Family education, Stair training, Dry Needling, Joint mobilization, Joint manipulation, Scar mobilization, DME instructions, Cryotherapy, and Moist heat  PLAN FOR NEXT SESSION: HEP review,  quad strengthening, knee flexion    Viviann Spare, PT, DPT  05/25/2023, 2:04 PM

## 2023-05-25 ENCOUNTER — Ambulatory Visit (HOSPITAL_COMMUNITY): Payer: BC Managed Care – PPO | Attending: Orthopedic Surgery

## 2023-05-25 ENCOUNTER — Encounter (HOSPITAL_COMMUNITY): Payer: Self-pay

## 2023-05-25 DIAGNOSIS — Z96652 Presence of left artificial knee joint: Secondary | ICD-10-CM | POA: Diagnosis present

## 2023-05-25 DIAGNOSIS — M25662 Stiffness of left knee, not elsewhere classified: Secondary | ICD-10-CM | POA: Insufficient documentation

## 2023-05-25 DIAGNOSIS — M6281 Muscle weakness (generalized): Secondary | ICD-10-CM | POA: Insufficient documentation

## 2023-05-25 DIAGNOSIS — M1712 Unilateral primary osteoarthritis, left knee: Secondary | ICD-10-CM | POA: Insufficient documentation

## 2023-05-26 NOTE — Therapy (Signed)
OUTPATIENT PHYSICAL THERAPY LOWER EXTREMITY TREATMENT   Patient Name: Julie Ryan MRN: [id number] DOB:[birth year], 57 y.o., female Today's Date: 05/27/2023  END OF SESSION:  PT End of Session - 05/27/23 1253     Visit Number 2    Number of Visits 13    Date for PT Re-Evaluation 07/06/23    Progress Note Due on Visit 10    PT Start Time 1301    PT Stop Time 1342    PT Time Calculation (min) 41 min    Activity Tolerance Patient tolerated treatment well    Behavior During Therapy [facility] for tasks assessed/performed              Past Medical History:  Diagnosis Date   Angio-edema    Anxiety    Arthritis    Phreesia 08/19/2020   Carpal tunnel syndrome of right wrist    Chronic knee pain    Depression    Endometrial polyp 08/09/2017   will get appt with Dr Despina Hidden   Fibroids 08/09/2017   Has multiple small fibroids    GERD (gastroesophageal reflux disease)    Hypertension    Neuropathy    Obesity    Prediabetes    Seizures (HCC)    1 seizure as a chold; unknown etiology and has never been on meds   Sleep apnea    Past Surgical History:  Procedure Laterality Date   APPENDECTOMY     CARPAL TUNNEL RELEASE Right 08/01/2015   Procedure: RIGHT CARPAL TUNNEL RELEASE;  Surgeon: Vickki Hearing, MD;  Location: [facility];  Service: Orthopedics;  Laterality: Right;   CERVICAL POLYPECTOMY  09/14/2017   Procedure: ENDOMETRIAL POLYPECTOMY;  Surgeon: Lazaro Arms, MD;  Location: [facility];  Service: Gynecology;;   COLONOSCOPY N/A 06/15/2017   Procedure: COLONOSCOPY;  Surgeon: Malissa Hippo, MD;  [facility];  Service: Endoscopy;  Laterality: N/A;  830   ENDOMETRIAL ABLATION N/A 09/14/2017   Procedure: ENDOMETRIAL ABLATION( [place]);  Surgeon: Lazaro Arms, MD;  Location: [facility];  Service: Gynecology;  Laterality: N/A;   ESOPHAGOGASTRODUODENOSCOPY N/A 05/25/2013   Procedure: ESOPHAGOGASTRODUODENOSCOPY (EGD);  Surgeon: Malissa Hippo, MD;  [facility];   Service: Endoscopy;  Laterality: N/A;  220   GASTRIC BYPASS     HYSTEROSCOPY WITH D & C N/A 09/14/2017   Procedure: DILATATION AND CURETTAGE /HYSTEROSCOPY;  Surgeon: Lazaro Arms, MD;  Location: [facility];  Service: Gynecology;  Laterality: N/A;   TOTAL KNEE ARTHROPLASTY Left 05/11/2023   Procedure: TOTAL KNEE ARTHROPLASTY;  Surgeon: Vickki Hearing, MD;  Location: [facility];  Service: Orthopedics;  Laterality: Left;   TUBAL LIGATION     Patient Active Problem List   Diagnosis Date Noted   s/p left knee replacement 05/11/23 05/27/2023   Primary osteoarthritis of left knee 05/11/2023   Osteoarthritis of left knee 05/11/2023   Acute cervical adenitis 04/14/2023   COVID-19 04/04/2023   Low serum vitamin B12 12/22/2022   Shortness of breath 07/06/2022   Multifocal pneumonia 05/13/2022   Bilateral primary osteoarthritis of knee 06/02/2021   Angioedema 01/01/2021   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 factor (HCC) 08/28/2020   High risk medication use 08/28/2020   Neck pain 08/19/2020   Dermatomycosis 07/01/2020   Encounter for screening fecal occult blood testing 02/26/2020   Encounter for gynecological examination with Papanicolaou smear of cervix 02/26/2020   Knee instability, left 09/06/2019   Hiatal hernia 07/17/2019   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/17/2019   Back spasm 04/12/2018   Endometrial polyp 08/09/2017   Fibroids 08/09/2017   Vaginal dryness 07/18/2017   Dyslipidemia 06/27/2017   Leg edema 03/08/2017   GAD (generalized anxiety disorder) 06/01/2016   Metabolic syndrome X 07/08/2013   Erosive esophagitis 06/25/2013   GERD (gastroesophageal reflux disease) 03/22/2013   Left knee pain 03/22/2013   Snoring 08/05/2011   Urinary incontinence 08/05/2011   Vitamin D deficiency 03/21/2011   Allergic rhinitis 09/20/2010   Back pain 03/17/2010   MORBID OBESITY 02/11/2010   Essential hypertension 02/11/2010    PCP: Kerri Perches, MD    REFERRING PROVIDER: Vickki Hearing, MD   REFERRING DIAG: [phone] (ICD-10-CM) - Primary osteoarthritis of left knee  THERAPY DIAG:  Status post total left knee replacement  Muscle weakness (generalized)  Stiffness of left knee, not elsewhere classified  Rationale for Evaluation and Treatment: Rehabilitation  ONSET DATE: 05/11/23  SUBJECTIVE:   SUBJECTIVE STATEMENT: Patient reports she was receiving HH until Monday 11/18. She reports they were working on stretches and exercises. Lives with her daughter and sister. Has been starting to utilize the cane for mobility but does not like to use for longer distances currently.   PERTINENT HISTORY: S/pp L TKA on 05/11/23  PAIN:  Are you having pain? Yes: NPRS scale: 9-10/10 Pain location: L knee with bending  Pain description: sharp    PRECAUTIONS: Knee and Fall  RED FLAGS: None   WEIGHT BEARING RESTRICTIONS: No  FALLS:  Has patient fallen in last 6 months? 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  DIAGNOSTIC FINDINGS: N/A   PATIENT SURVEYS:  LEFS 42.5% (34/80)  COGNITION: Overall cognitive status: Within functional limits for tasks assessed     SENSATION: WFL  POSTURE: rounded shoulders and forward head  PALPATION: Unable to assess knee incision 2/2 pant style   LOWER EXTREMITY ROM:  Active ROM Right eval Left eval  Hip flexion    Hip extension    Hip abduction    Hip adduction    Hip internal rotation    Hip external rotation    Knee  flexion  68  Knee extension  0  Ankle dorsiflexion    Ankle plantarflexion    Ankle inversion    Ankle eversion     (Blank rows = not tested)  LOWER EXTREMITY MMT:  MMT Right eval Left eval  Hip flexion WFL   Hip extension    Hip abduction [facility]   Hip adduction    Hip internal rotation    Hip external rotation    Knee flexion WFL   Knee extension WFL   Ankle dorsiflexion WFL   Ankle plantarflexion    Ankle inversion    Ankle eversion     (Blank rows = not tested)  -->14 degree extension lag with SLR   FUNCTIONAL TESTS:  5 times sit to stand: 36.75 seconds 2 minute walk test: to be completed visit #2   GAIT: Distance walked: 30' Assistive device utilized: Single point cane Level  of assistance: Complete Independence Comments: decreased stance time on L, good heel strike noted    TODAY'S TREATMENT:                                                                                                                              DATE: 05/27/23   Subjective: Patient reports getting stitches out this AM. Soreness but no pain.    TE: Bike seat 13 partial revolutions x 5 minutes  Seated LAQ 2 x 10 on L with 3 second hold  Sit to stand 2 x 5 with no UE support - sitting on black airex  Supine quad sets x 10 with 3 second hold  Supine small range SLR  x 10 on L  Supine heel slides x 5    AROM/AAROM L knee flexion: 70/73   PATIENT EDUCATION:  Education details: HEP, POC, goals  Person educated: Patient Education method: Medical illustrator Education comprehension: verbalized understanding and returned demonstration  HOME EXERCISE PROGRAM: Encouraged continued performance of HEP given by HHPT after review of exercises provided  ASSESSMENT:  CLINICAL IMPRESSION:  Goals reviewed and POC discussed. Session focused on LLE strengthening and improve knee ROM. Able to achieve 70 degrees of AROM L knee flexion at end of session. Patient will to benefit from skilled PT  interventions to address deficits in order to improve quality of life and return to PLOF.      OBJECTIVE IMPAIRMENTS: Abnormal gait, decreased activity tolerance, decreased endurance, decreased mobility, difficulty walking, decreased ROM, decreased strength, hypomobility, impaired flexibility, and pain.   ACTIVITY LIMITATIONS: squatting, sleeping, stairs, and locomotion level  PARTICIPATION LIMITATIONS: cleaning, laundry, driving, community activity, and yard work  PERSONAL FACTORS: Age, Past/current experiences, and Time since onset of injury/illness/exacerbation are also affecting patient's functional outcome.   REHAB POTENTIAL: Good  CLINICAL DECISION MAKING: Stable/uncomplicated  EVALUATION COMPLEXITY: Low   GOALS: Goals reviewed with patient? Yes  SHORT TERM GOALS: Target date: 06/14/2023  Patient will be independent in HEP to improve strength/mobility for better functional independence with ADLs. Baseline: Goal status: INITIAL  LONG TERM GOALS: Target date: 07/05/2023   Patient will increase lower extremity functional scale to >60/80 to demonstrate improved functional mobility and increased tolerance with ADLs.  Baseline: 11/20: 42.5% Goal status: INITIAL  2.  Patient will increase BLE gross strength to 4+/5 as to improve functional strength for independent gait, increased standing tolerance and increased ADL ability. Baseline:  Goal status: INITIAL  3.  Patient will demonstrates SLR with 0 degree extension lag to demonstrate improved quad strength.  Baseline: 11/20: 14 degree extension lag  Goal status: INITIAL  4.   Patient (< 50 years old) will complete five times sit to stand test in < 10 seconds indicating an increased LE strength and improved balance. Baseline: 11/20: 36.75 seconds Goal status: INITIAL  5.  Patient will demonstrate L knee AROM symmetrical to R knee for improve gait ability and overall mobility.  Baseline: 11/20: see above  Goal status:  INITIAL   PLAN:  PT FREQUENCY:  1-3x/week  PT DURATION: 6 weeks  PLANNED INTERVENTIONS: 97164- PT Re-evaluation, 97110-Therapeutic exercises, 97530- Therapeutic activity, 97112- Neuromuscular re-education, 97535- Self Care, 40981- Manual therapy, 97116- Gait training, 97014- Electrical stimulation (unattended), [phone]- Electrical stimulation (manual), Patient/Family education, Stair training, Dry Needling, Joint mobilization, Joint manipulation, Scar mobilization, DME instructions, Cryotherapy, and Moist heat  PLAN FOR NEXT SESSION: HEP review, quad strengthening, knee flexion, gait with SPC    Viviann Spare, PT, DPT  05/27/2023, 12:53 PM

## 2023-05-27 ENCOUNTER — Ambulatory Visit (INDEPENDENT_AMBULATORY_CARE_PROVIDER_SITE_OTHER): Payer: BC Managed Care – PPO | Admitting: Orthopedic Surgery

## 2023-05-27 ENCOUNTER — Encounter (HOSPITAL_COMMUNITY): Payer: Self-pay

## 2023-05-27 ENCOUNTER — Ambulatory Visit (HOSPITAL_COMMUNITY): Payer: BC Managed Care – PPO

## 2023-05-27 ENCOUNTER — Encounter: Payer: Self-pay | Admitting: Orthopedic Surgery

## 2023-05-27 DIAGNOSIS — M25662 Stiffness of left knee, not elsewhere classified: Secondary | ICD-10-CM

## 2023-05-27 DIAGNOSIS — M6281 Muscle weakness (generalized): Secondary | ICD-10-CM

## 2023-05-27 DIAGNOSIS — Z96652 Presence of left artificial knee joint: Secondary | ICD-10-CM

## 2023-05-27 MED ORDER — OXYCODONE HCL 5 MG PO TABS: 5.0000 mg | ORAL_TABLET | Freq: Four times a day (QID) | ORAL | 0 refills | Status: AC | PRN

## 2023-05-27 NOTE — Progress Notes (Signed)
Orthopaedic Postop Note  Assessment: Julie Ryan is a 57 y.o. female s/p left total knee arthroplasty, Dr. Romeo Apple  DOS: 05/11/2023  Plan: Sutures were removed.  New dressing placed. Stressed the importance of working on range of motion of the left knee. First PT session today. Ice as needed. Focus on full extension, and flexion beyond 90 degrees. Walker to assist with ambulation as needed. Okay to shower, do not scrub and pat the incision dry Limited prescription for oxycodone provided today Follow-up in 2 weeks with Dr. Romeo Apple.  Meds ordered this encounter  Medications   oxyCODONE (ROXICODONE) 5 MG immediate release tablet    Sig: Take 1 tablet (5 mg total) by mouth every 6 (six) hours as needed for up to 7 days.    Dispense:  20 tablet    Refill:  0     Follow-up: Return in about 2 weeks (around 06/10/2023) for Follow up with Dr. Romeo Apple.  Subjective:  Chief Complaint  Patient presents with   Post-op Follow-up    Left knee replacement /improving starts outpatient therapy today    History of Present Illness: Julie Ryan is a 57 y.o. female who presents following the above stated procedure.  She has done well.  She has her first formal physical therapy session today.  She has been working on some range of motion.  She states she is only taking tramadol for pain currently.  She continues to have some pain in the left knee.  No fevers or chills.  No shortness of breath.  Review of Systems: No fevers or chills No numbness or tingling No Chest Pain No shortness of breath   Objective: LMP 08/31/2017 (Approximate)   Physical Exam:  Surgical incision is healing well.  No surrounding erythema or drainage.  Range of motion 5-90 degrees. Swelling about the knee. Toes are warm and well-perfused.  Sensation intact distally.  IMAGING: I personally ordered and reviewed the following images:  No new imaging obtained today.  Oliver Barre,  MD 05/27/2023 12:31 PM

## 2023-05-27 NOTE — Therapy (Signed)
OUTPATIENT PHYSICAL THERAPY LOWER EXTREMITY TREATMENT   Patient Name: Julie Ryan MRN: [id number] DOB:[birth year], 57 y.o., female Today's Date: 05/30/2023  END OF SESSION:  PT End of Session - 05/30/23 1303     Visit Number 3    Number of Visits 13    Date for PT Re-Evaluation 07/06/23    Progress Note Due on Visit 10    PT Start Time 1302    PT Stop Time 1344    PT Time Calculation (min) 42 min    Activity Tolerance Patient tolerated treatment well    Behavior During Therapy [facility] for tasks assessed/performed               Past Medical History:  Diagnosis Date   Angio-edema    Anxiety    Arthritis    Phreesia 08/19/2020   Carpal tunnel syndrome of right wrist    Chronic knee pain    Depression    Endometrial polyp 08/09/2017   will get appt with Dr Despina Hidden   Fibroids 08/09/2017   Has multiple small fibroids    GERD (gastroesophageal reflux disease)    Hypertension    Neuropathy    Obesity    Prediabetes    Seizures (HCC)    1 seizure as a chold; unknown etiology and has never been on meds   Sleep apnea    Past Surgical History:  Procedure Laterality Date   APPENDECTOMY     CARPAL TUNNEL RELEASE Right 08/01/2015   Procedure: RIGHT CARPAL TUNNEL RELEASE;  Surgeon: Vickki Hearing, MD;  Location: [facility];  Service: Orthopedics;  Laterality: Right;   CERVICAL POLYPECTOMY  09/14/2017   Procedure: ENDOMETRIAL POLYPECTOMY;  Surgeon: Lazaro Arms, MD;  Location: [facility];  Service: Gynecology;;   COLONOSCOPY N/A 06/15/2017   Procedure: COLONOSCOPY;  Surgeon: Malissa Hippo, MD;  [facility];  Service: Endoscopy;  Laterality: N/A;  830   ENDOMETRIAL ABLATION N/A 09/14/2017   Procedure: ENDOMETRIAL ABLATION( [place]);  Surgeon: Lazaro Arms, MD;  Location: [facility];  Service: Gynecology;  Laterality: N/A;   ESOPHAGOGASTRODUODENOSCOPY N/A 05/25/2013   Procedure: ESOPHAGOGASTRODUODENOSCOPY (EGD);  Surgeon: Malissa Hippo, MD;  [facility];   Service: Endoscopy;  Laterality: N/A;  220   GASTRIC BYPASS     HYSTEROSCOPY WITH D & C N/A 09/14/2017   Procedure: DILATATION AND CURETTAGE /HYSTEROSCOPY;  Surgeon: Lazaro Arms, MD;  Location: [facility];  Service: Gynecology;  Laterality: N/A;   TOTAL KNEE ARTHROPLASTY Left 05/11/2023   Procedure: TOTAL KNEE ARTHROPLASTY;  Surgeon: Vickki Hearing, MD;  Location: [facility];  Service: Orthopedics;  Laterality: Left;   TUBAL LIGATION     Patient Active Problem List   Diagnosis Date Noted   s/p left knee replacement 05/11/23 05/27/2023   Primary osteoarthritis of left knee 05/11/2023   Osteoarthritis of left knee 05/11/2023   Acute cervical adenitis 04/14/2023   COVID-19 04/04/2023   Low serum vitamin B12 12/22/2022   Shortness of breath 07/06/2022   Multifocal pneumonia 05/13/2022   Bilateral primary osteoarthritis of knee 06/02/2021   Angioedema 01/01/2021   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 factor (HCC) 08/28/2020   High risk medication use 08/28/2020   Neck pain 08/19/2020   Dermatomycosis 07/01/2020   Encounter for screening fecal occult blood testing 02/26/2020   Encounter for gynecological examination with Papanicolaou smear of cervix 02/26/2020   Knee instability, left 09/06/2019   Hiatal hernia 07/17/2019   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   Back spasm 04/12/2018   Endometrial polyp 08/09/2017   Fibroids 08/09/2017   Vaginal dryness 07/18/2017   Dyslipidemia 06/27/2017   Leg edema 03/08/2017   GAD (generalized anxiety disorder) 06/01/2016   Metabolic syndrome X 07/08/2013   Erosive esophagitis 06/25/2013   GERD (gastroesophageal reflux disease) 03/22/2013   Left knee pain 03/22/2013   Snoring 08/05/2011   Urinary incontinence 08/05/2011   Vitamin D deficiency 03/21/2011   Allergic rhinitis 09/20/2010   Back pain 03/17/2010   MORBID OBESITY 02/11/2010   Essential hypertension 02/11/2010    PCP: Kerri Perches, MD    REFERRING PROVIDER: Vickki Hearing, MD   REFERRING DIAG: [phone] (ICD-10-CM) - Primary osteoarthritis of left knee  THERAPY DIAG:  Status post total left knee replacement  Muscle weakness (generalized)  Stiffness of left knee, not elsewhere classified  Rationale for Evaluation and Treatment: Rehabilitation  ONSET DATE: 05/11/23  SUBJECTIVE:   SUBJECTIVE STATEMENT: Patient reports she was receiving HH until Monday 11/18. She reports they were working on stretches and exercises. Lives with her daughter and sister. Has been starting to utilize the cane for mobility but does not like to use for longer distances currently.   PERTINENT HISTORY: S/pp L TKA on 05/11/23  PAIN:  Are you having pain? Yes: NPRS scale: 9-10/10 Pain location: L knee with bending  Pain description: sharp    PRECAUTIONS: Knee and Fall  RED FLAGS: None   WEIGHT BEARING RESTRICTIONS: No  FALLS:  Has patient fallen in last 6 months? 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  DIAGNOSTIC FINDINGS: N/A   PATIENT SURVEYS:  LEFS 42.5% (34/80)  COGNITION: Overall cognitive status: Within functional limits for tasks assessed     SENSATION: WFL  POSTURE: rounded shoulders and forward head  PALPATION: Unable to assess knee incision 2/2 pant style   LOWER EXTREMITY ROM:  Active ROM Right eval Left eval  Hip flexion    Hip extension    Hip abduction    Hip adduction    Hip internal rotation    Hip external rotation    Knee  flexion  68  Knee extension  0  Ankle dorsiflexion    Ankle plantarflexion    Ankle inversion    Ankle eversion     (Blank rows = not tested)  LOWER EXTREMITY MMT:  MMT Right eval Left eval  Hip flexion WFL   Hip extension    Hip abduction [facility]   Hip adduction    Hip internal rotation    Hip external rotation    Knee flexion WFL   Knee extension WFL   Ankle dorsiflexion WFL   Ankle plantarflexion    Ankle inversion    Ankle eversion     (Blank rows = not tested)  -->14 degree extension lag with SLR   FUNCTIONAL TESTS:  5 times sit to stand: 36.75 seconds 2 minute walk test: to be completed visit #2   GAIT: Distance walked: 30' Assistive device utilized: Single point cane  Level of assistance: Complete Independence Comments: decreased stance time on L, good heel strike noted    TODAY'S TREATMENT:                                                                                                                              DATE: 05/30/23    Subjective: Patient reports she felt like she did too much yesterday after going to the grocery store and cleaning her room. Reports increased swelling this morning     TE: Bike seat 13 partial revolutions x 5 minutes  Gait training 200' with SPC with focus on heel strike Standing knee flexion stretch on 2nd step 10 x 10 second hold Supine quad sets x 10 with 3 second hold   Supine heel slides x 5 - x 3 with overpressure     AROM/AAROM L knee flexion: 73/76  PATIENT EDUCATION:  Education details: HEP, POC, goals  Person educated: Patient Education method: Medical illustrator Education comprehension: verbalized understanding and returned demonstration  HOME EXERCISE PROGRAM: Encouraged continued performance of HEP given by HHPT after review of exercises provided  ASSESSMENT:  CLINICAL IMPRESSION:   Patient arrives to treatment session motivated to participate. Session focused on LLE strengthening, gait training  with SPC, and improve knee ROM. Able to achieve 73 degrees of AROM L knee flexion at end of session. Patient will to benefit from skilled PT interventions to address deficits in order to improve quality of life and return to PLOF.      OBJECTIVE IMPAIRMENTS: Abnormal gait, decreased activity tolerance, decreased endurance, decreased mobility, difficulty walking, decreased ROM, decreased strength, hypomobility, impaired flexibility, and pain.   ACTIVITY LIMITATIONS: squatting, sleeping, stairs, and locomotion level  PARTICIPATION LIMITATIONS: cleaning, laundry, driving, community activity, and yard work  PERSONAL FACTORS: Age, Past/current experiences, and Time since onset of injury/illness/exacerbation are also affecting patient's functional outcome.   REHAB POTENTIAL: Good  CLINICAL DECISION MAKING: Stable/uncomplicated  EVALUATION COMPLEXITY: Low   GOALS: Goals reviewed with patient? Yes  SHORT TERM GOALS: Target date: 06/14/2023  Patient will be independent in HEP to improve strength/mobility for better functional independence with ADLs. Baseline: Goal status: INITIAL  LONG TERM GOALS: Target date: 07/05/2023   Patient will increase lower extremity functional scale to >60/80 to demonstrate improved functional mobility and increased tolerance with ADLs.  Baseline: 11/20: 42.5% Goal status: INITIAL  2.  Patient will increase BLE gross strength to 4+/5 as to improve functional strength for independent gait, increased standing tolerance and increased ADL ability. Baseline:  Goal status: INITIAL  3.  Patient will demonstrates SLR with 0 degree extension lag to demonstrate improved quad strength.  Baseline: 11/20: 14 degree extension lag  Goal status: INITIAL  4.   Patient (< 95 years old) will complete five times sit to stand test in < 10 seconds indicating an increased LE strength and improved balance. Baseline: 11/20: 36.75 seconds Goal status: INITIAL  5.  Patient will  demonstrate L knee AROM symmetrical to R knee for improve gait ability and overall mobility.  Baseline: 11/20: see above  Goal status: INITIAL   PLAN:  PT FREQUENCY:  1-3x/week  PT DURATION: 6 weeks  PLANNED INTERVENTIONS: 97164- PT Re-evaluation, 97110-Therapeutic exercises, 97530- Therapeutic activity, 97112- Neuromuscular re-education, 97535- Self Care, 09811- Manual therapy, 97116- Gait training, 97014- Electrical stimulation (unattended), [phone]- Electrical stimulation (manual), Patient/Family education, Stair training, Dry Needling, Joint mobilization, Joint manipulation, Scar mobilization, DME instructions, Cryotherapy, and Moist heat  PLAN FOR NEXT SESSION: HEP review, quad strengthening, knee flexion, gait with SPC    Viviann Spare, PT, DPT  05/30/2023, 1:47 PM

## 2023-05-27 NOTE — Patient Instructions (Signed)
Continue to work on range of motion.  Focus on full extension.  Physical therapy as prescribed.  Medication as needed.  Limited prescription for oxycodone has been provided.  Continue to take aspirin.  Follow-up with Dr. Romeo Apple in 2 weeks.

## 2023-05-30 ENCOUNTER — Ambulatory Visit (HOSPITAL_COMMUNITY): Payer: BC Managed Care – PPO

## 2023-05-30 ENCOUNTER — Encounter (HOSPITAL_COMMUNITY): Payer: Self-pay

## 2023-05-30 DIAGNOSIS — M25662 Stiffness of left knee, not elsewhere classified: Secondary | ICD-10-CM

## 2023-05-30 DIAGNOSIS — Z96652 Presence of left artificial knee joint: Secondary | ICD-10-CM | POA: Diagnosis not present

## 2023-05-30 DIAGNOSIS — M6281 Muscle weakness (generalized): Secondary | ICD-10-CM

## 2023-06-01 ENCOUNTER — Ambulatory Visit (HOSPITAL_COMMUNITY): Payer: BC Managed Care – PPO

## 2023-06-01 ENCOUNTER — Ambulatory Visit: Payer: BC Managed Care – PPO | Admitting: Family Medicine

## 2023-06-01 VITALS — BP 129/80 | HR 67 | Ht 66.0 in | Wt 212.1 lb

## 2023-06-01 DIAGNOSIS — I10 Essential (primary) hypertension: Secondary | ICD-10-CM | POA: Diagnosis not present

## 2023-06-01 DIAGNOSIS — E559 Vitamin D deficiency, unspecified: Secondary | ICD-10-CM

## 2023-06-01 DIAGNOSIS — Z96652 Presence of left artificial knee joint: Secondary | ICD-10-CM | POA: Diagnosis not present

## 2023-06-01 DIAGNOSIS — E785 Hyperlipidemia, unspecified: Secondary | ICD-10-CM

## 2023-06-01 DIAGNOSIS — M25662 Stiffness of left knee, not elsewhere classified: Secondary | ICD-10-CM

## 2023-06-01 DIAGNOSIS — M05742 Rheumatoid arthritis with rheumatoid factor of left hand without organ or systems involvement: Secondary | ICD-10-CM

## 2023-06-01 DIAGNOSIS — M6281 Muscle weakness (generalized): Secondary | ICD-10-CM

## 2023-06-01 DIAGNOSIS — M05741 Rheumatoid arthritis with rheumatoid factor of right hand without organ or systems involvement: Secondary | ICD-10-CM

## 2023-06-01 MED ORDER — PHENTERMINE HCL 37.5 MG PO TABS: 37.5000 mg | ORAL_TABLET | Freq: Every day | ORAL | 0 refills | Status: DC

## 2023-06-01 NOTE — Patient Instructions (Addendum)
F/U in 11 weeks, call if you need me sooner  Weight loss goal of 6 to 10 pounds, AVOIDING all sugar except in fruits will go a long way to getting  to goal  Best for the Season and 2025!  Thankful  left knee surgery is doing very well  Thanks for choosing [facility], we consider it a privelige to serve you.

## 2023-06-01 NOTE — Therapy (Addendum)
OUTPATIENT PHYSICAL THERAPY LOWER EXTREMITY TREATMENT   Patient Name: Julie Ryan MRN: [id number] DOB:[birth year], 57 y.o., female Today's Date: 06/01/2023  END OF SESSION:  PT End of Session - 06/01/23 1403     Visit Number 4    Number of Visits 13    Date for PT Re-Evaluation 07/06/23    Authorization Type [employer] state    Progress Note Due on Visit 10    PT Start Time 1403    PT Stop Time 1443    PT Time Calculation (min) 40 min    Activity Tolerance Patient tolerated treatment well    Behavior During Therapy [facility] for tasks assessed/performed               Past Medical History:  Diagnosis Date   Angio-edema    Anxiety    Arthritis    Phreesia 08/19/2020   Carpal tunnel syndrome of right wrist    Chronic knee pain    Depression    Endometrial polyp 08/09/2017   will get appt with Dr Despina Hidden   Fibroids 08/09/2017   Has multiple small fibroids    GERD (gastroesophageal reflux disease)    Hypertension    Neuropathy    Obesity    Prediabetes    Seizures (HCC)    1 seizure as a chold; unknown etiology and has never been on meds   Sleep apnea    Past Surgical History:  Procedure Laterality Date   APPENDECTOMY     CARPAL TUNNEL RELEASE Right 08/01/2015   Procedure: RIGHT CARPAL TUNNEL RELEASE;  Surgeon: Vickki Hearing, MD;  [facility];  Service: Orthopedics;  Laterality: Right;   CERVICAL POLYPECTOMY  09/14/2017   Procedure: ENDOMETRIAL POLYPECTOMY;  Surgeon: Lazaro Arms, MD;  Location: [facility];  Service: Gynecology;;   COLONOSCOPY N/A 06/15/2017   Procedure: COLONOSCOPY;  Surgeon: Malissa Hippo, MD;  [facility];  Service: Endoscopy;  Laterality: N/A;  830   ENDOMETRIAL ABLATION N/A 09/14/2017   Procedure: ENDOMETRIAL ABLATION( [place]);  Surgeon: Lazaro Arms, MD;  Location: [facility];  Service: Gynecology;  Laterality: N/A;   ESOPHAGOGASTRODUODENOSCOPY N/A 05/25/2013   Procedure: ESOPHAGOGASTRODUODENOSCOPY (EGD);  Surgeon: Malissa Hippo, MD;  [facility];  Service: Endoscopy;  Laterality: N/A;  220   GASTRIC BYPASS     HYSTEROSCOPY WITH D & C N/A 09/14/2017   Procedure: DILATATION AND CURETTAGE /HYSTEROSCOPY;  Surgeon: Lazaro Arms, MD;  Location: [facility];  Service: Gynecology;  Laterality: N/A;   TOTAL KNEE ARTHROPLASTY Left 05/11/2023   Procedure: TOTAL KNEE ARTHROPLASTY;  Surgeon: Vickki Hearing, MD;  Location: [facility];  Service: Orthopedics;  Laterality: Left;   TUBAL LIGATION     Patient Active Problem List   Diagnosis Date Noted   s/p left knee replacement 05/11/23 05/27/2023   Primary osteoarthritis of left knee 05/11/2023   Osteoarthritis of left knee 05/11/2023   Acute cervical adenitis 04/14/2023   COVID-19 04/04/2023   Low serum vitamin B12 12/22/2022   Shortness of breath 07/06/2022   Multifocal pneumonia 05/13/2022   Bilateral primary osteoarthritis of knee 06/02/2021   Angioedema 01/01/2021   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 factor (HCC) 08/28/2020   High risk medication use 08/28/2020   Neck pain 08/19/2020   Dermatomycosis 07/01/2020   Encounter for screening fecal occult blood testing 02/26/2020   Encounter for gynecological examination with Papanicolaou smear of cervix 02/26/2020   Knee instability, left 09/06/2019  Hiatal hernia 07/17/2019   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   Back spasm 04/12/2018   Endometrial polyp 08/09/2017   Fibroids 08/09/2017   Vaginal dryness 07/18/2017   Dyslipidemia 06/27/2017   Leg edema 03/08/2017   GAD (generalized anxiety disorder) 06/01/2016   Metabolic syndrome X 07/08/2013   Erosive esophagitis 06/25/2013   GERD (gastroesophageal reflux disease) 03/22/2013   Left knee pain 03/22/2013   Snoring 08/05/2011   Urinary incontinence 08/05/2011   Vitamin D deficiency 03/21/2011   Allergic rhinitis 09/20/2010   Back pain 03/17/2010   MORBID OBESITY 02/11/2010   Essential hypertension  02/11/2010    PCP: Kerri Perches, MD   REFERRING PROVIDER: Vickki Hearing, MD   REFERRING DIAG: [phone] (ICD-10-CM) - Primary osteoarthritis of left knee  THERAPY DIAG:  Status post total left knee replacement  Muscle weakness (generalized)  Stiffness of left knee, not elsewhere classified  Rationale for Evaluation and Treatment: Rehabilitation  ONSET DATE: 05/11/23  SUBJECTIVE:   SUBJECTIVE STATEMENT: 5/10 pain left knee today; using a RW on arrival.  Wearing compression stockings bilaterally and jeans  PERTINENT HISTORY: S/pp L TKA on 05/11/23  PAIN:  Are you having pain? Yes: NPRS scale: 9-10/10 Pain location: L knee with bending  Pain description: sharp    PRECAUTIONS: Knee and Fall  RED FLAGS: None   WEIGHT BEARING RESTRICTIONS: No  FALLS:  Has patient fallen in last 6 months? 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  DIAGNOSTIC FINDINGS: N/A   PATIENT SURVEYS:  LEFS 42.5% (34/80)  COGNITION: Overall cognitive status: Within functional limits for tasks assessed     SENSATION: WFL  POSTURE: rounded shoulders and forward head  PALPATION: Unable to assess knee incision 2/2 pant style   LOWER EXTREMITY ROM:  Active ROM Right eval Left eval Right 06/01/23  Hip flexion     Hip extension     Hip abduction     Hip adduction     Hip internal rotation     Hip external rotation     Knee flexion  68 78  Knee extension  0   Ankle dorsiflexion     Ankle plantarflexion     Ankle  inversion     Ankle eversion      (Blank rows = not tested)  LOWER EXTREMITY MMT:  MMT Right eval Left eval  Hip flexion WFL   Hip extension    Hip abduction [facility]   Hip adduction    Hip internal rotation    Hip external rotation    Knee flexion WFL   Knee extension WFL   Ankle dorsiflexion WFL   Ankle plantarflexion    Ankle inversion    Ankle eversion     (Blank rows = not tested)  -->14 degree extension lag with SLR   FUNCTIONAL TESTS:  5 times sit to stand: 36.75 seconds 2 minute walk test: to be completed visit #2   GAIT: Distance walked: 30' Assistive device utilized: Single point cane Level of assistance: Complete Independence Comments: decreased  stance time on L, good heel strike noted    TODAY'S TREATMENT:                                                                                                                              DATE:   06/01/23 Bike seat 13 half revolutions x 5' dynamic warm up  Seated: heel slides x 2' Standing: Heel raises 2 x 10 6" step knee drives for flexion x 2' Mini squats x 10 4" step ups x 10 with 2 UE assist Gait training with SPC x 200 ft Seated knee flexion 78 AROM today    05/30/23 TE: Bike seat 13 partial revolutions x 5 minutes  Gait training 200' with SPC with focus on heel strike Standing knee flexion stretch on 2nd step 10 x 10 second hold Supine quad sets x 10 with 3 second hold   Supine heel slides x 5 - x 3 with overpressure     AROM/AAROM L knee flexion: 73/76  PATIENT EDUCATION:  Education details: HEP, POC, goals  Person educated: Patient Education method: Medical illustrator Education comprehension: verbalized understanding and returned demonstration  HOME EXERCISE PROGRAM: Encouraged continued performance of HEP given by HHPT after review of exercises provided  ASSESSMENT:  CLINICAL IMPRESSION:   Todays session with focus on left knee mobility and gentle strengthening.  Patient  states she is "afraid of bending" her left knee.  Wearing compression stockings and jeans today.  Steri strips remain; improving mobility noted.  Able to walk with [facility] with PT SBA/Supervision only. Patient will benefit from continued skilled therapy services to address deficits and promote return to optimal function.      OBJECTIVE IMPAIRMENTS: Abnormal gait, decreased activity tolerance, decreased endurance, decreased mobility, difficulty walking, decreased ROM, decreased strength, hypomobility, impaired flexibility, and pain.   ACTIVITY LIMITATIONS: squatting, sleeping, stairs, and locomotion level  PARTICIPATION LIMITATIONS: cleaning, laundry, driving, community activity, and yard work  PERSONAL FACTORS: Age, Past/current experiences, and Time since onset of injury/illness/exacerbation are also affecting patient's functional outcome.   REHAB POTENTIAL: Good  CLINICAL DECISION MAKING: Stable/uncomplicated  EVALUATION COMPLEXITY: Low   GOALS: Goals reviewed with patient? Yes  SHORT TERM GOALS: Target date: 06/14/2023  Patient will be independent in HEP to improve strength/mobility for better functional independence with ADLs. Baseline: Goal status: INITIAL  LONG TERM GOALS: Target date: 07/05/2023   Patient will increase lower extremity functional scale to >60/80 to demonstrate improved functional mobility and increased tolerance with ADLs.  Baseline: 11/20: 42.5% Goal status: INITIAL  2.  Patient will increase BLE gross strength to 4+/5 as to improve functional strength for independent gait, increased standing tolerance and increased ADL ability. Baseline:  Goal status: INITIAL  3.  Patient will demonstrates SLR with 0 degree extension lag to demonstrate improved quad strength.  Baseline: 11/20: 14 degree extension lag  Goal status: INITIAL  4.   Patient (< 73 years old) will complete five times sit  to stand test in < 10 seconds indicating an increased LE strength and  improved balance. Baseline: 11/20: 36.75 seconds Goal status: INITIAL  5.  Patient will demonstrate L knee AROM symmetrical to R knee for improve gait ability and overall mobility.  Baseline: 11/20: see above  Goal status: INITIAL   PLAN:  PT FREQUENCY:  1-3x/week  PT DURATION: 6 weeks  PLANNED INTERVENTIONS: 97164- PT Re-evaluation, 97110-Therapeutic exercises, 97530- Therapeutic activity, 97112- Neuromuscular re-education, 97535- Self Care, 16109- Manual therapy, [phone]- Gait training, 97014- Electrical stimulation (unattended), [phone]- Electrical stimulation (manual), Patient/Family education, Stair training, Dry Needling, Joint mobilization, Joint manipulation, Scar mobilization, DME instructions, Cryotherapy, and Moist heat  PLAN FOR NEXT SESSION: HEP review, quad strengthening, knee flexion, gait with SPC   2:49 PM, 06/01/23 Laren Whaling Small Asiana Benninger MPT [facility] [place] [phone] Ph:[phone]    Managed Medicaid Authorization Request/ [employer] [facility]  Visit Dx Codes: Z 96.652, M62.81, [id number]  Functional Tool Score: LEFS 34/80 42.5%  For all possible CPT codes, reference the Planned Interventions line above.     Check all conditions that are expected to impact treatment: {Conditions expected to impact treatment:None of these apply   If treatment provided at initial evaluation, no treatment charged due to lack of authorization.

## 2023-06-06 ENCOUNTER — Encounter: Payer: Self-pay | Admitting: Orthopedic Surgery

## 2023-06-06 ENCOUNTER — Ambulatory Visit (HOSPITAL_COMMUNITY): Payer: BC Managed Care – PPO | Attending: Orthopedic Surgery

## 2023-06-06 DIAGNOSIS — M05741 Rheumatoid arthritis with rheumatoid factor of right hand without organ or systems involvement: Secondary | ICD-10-CM | POA: Diagnosis present

## 2023-06-06 DIAGNOSIS — M6281 Muscle weakness (generalized): Secondary | ICD-10-CM | POA: Diagnosis present

## 2023-06-06 DIAGNOSIS — Z79899 Other long term (current) drug therapy: Secondary | ICD-10-CM | POA: Insufficient documentation

## 2023-06-06 DIAGNOSIS — Z96652 Presence of left artificial knee joint: Secondary | ICD-10-CM

## 2023-06-06 DIAGNOSIS — M25662 Stiffness of left knee, not elsewhere classified: Secondary | ICD-10-CM | POA: Diagnosis present

## 2023-06-06 DIAGNOSIS — M05742 Rheumatoid arthritis with rheumatoid factor of left hand without organ or systems involvement: Secondary | ICD-10-CM | POA: Diagnosis present

## 2023-06-06 DIAGNOSIS — M170 Bilateral primary osteoarthritis of knee: Secondary | ICD-10-CM | POA: Insufficient documentation

## 2023-06-06 NOTE — Therapy (Signed)
OUTPATIENT PHYSICAL THERAPY LOWER EXTREMITY TREATMENT   Patient Name: MIHIRA PEYER MRN: [id number] DOB:Aug 31, 1965, 57 y.o., female Today's Date: 06/06/2023  END OF SESSION:  PT End of Session - 06/06/23 1401     Visit Number 5    Number of Visits 13    Date for PT Re-Evaluation 07/06/23    Authorization Type BCBS state    Progress Note Due on Visit 10    PT Start Time 1355    PT Stop Time 1430    PT Time Calculation (min) 35 min    Activity Tolerance Patient tolerated treatment well    Behavior During Therapy [facility] for tasks assessed/performed               Past Medical History:  Diagnosis Date   Angio-edema    Anxiety    Arthritis    Phreesia 08/19/2020   Carpal tunnel syndrome of right wrist    Chronic knee pain    Depression    Endometrial polyp 08/09/2017   will get appt with Dr Despina Hidden   Fibroids 08/09/2017   Has multiple small fibroids    GERD (gastroesophageal reflux disease)    Hypertension    Neuropathy    Obesity    Prediabetes    Seizures (HCC)    1 seizure as a chold; unknown etiology and has never been on meds   Sleep apnea    Past Surgical History:  Procedure Laterality Date   APPENDECTOMY     CARPAL TUNNEL RELEASE Right 08/01/2015   Procedure: RIGHT CARPAL TUNNEL RELEASE;  Surgeon: Vickki Hearing, MD;  Location: [facility];  Service: Orthopedics;  Laterality: Right;   CERVICAL POLYPECTOMY  09/14/2017   Procedure: ENDOMETRIAL POLYPECTOMY;  Surgeon: Lazaro Arms, MD;  Location: [facility];  Service: Gynecology;;   COLONOSCOPY N/A 06/15/2017   Procedure: COLONOSCOPY;  Surgeon: Malissa Hippo, MD;  Location: [facility];  Service: Endoscopy;  Laterality: N/A;  830   ENDOMETRIAL ABLATION N/A 09/14/2017   Procedure: ENDOMETRIAL ABLATION( [place]);  Surgeon: Lazaro Arms, MD;  Location: [facility];  Service: Gynecology;  Laterality: N/A;   ESOPHAGOGASTRODUODENOSCOPY N/A 05/25/2013   Procedure: ESOPHAGOGASTRODUODENOSCOPY (EGD);  Surgeon: Malissa Hippo,  MD;  [facility]: [facility];  Service: Endoscopy;  Laterality: N/A;  220   GASTRIC BYPASS     HYSTEROSCOPY WITH D & C N/A 09/14/2017   Procedure: DILATATION AND CURETTAGE /HYSTEROSCOPY;  Surgeon: Lazaro Arms, MD;  Location: [facility];  Service: Gynecology;  Laterality: N/A;   TOTAL KNEE ARTHROPLASTY Left 05/11/2023   Procedure: TOTAL KNEE ARTHROPLASTY;  Surgeon: Vickki Hearing, MD;  Location: [facility];  Service: Orthopedics;  Laterality: Left;   TUBAL LIGATION     Patient Active Problem List   Diagnosis Date Noted   s/p left knee replacement 05/11/23 05/27/2023   Primary osteoarthritis of left knee 05/11/2023   Osteoarthritis of left knee 05/11/2023   Acute cervical adenitis 04/14/2023   COVID-19 04/04/2023   Low serum vitamin B12 12/22/2022   Shortness of breath 07/06/2022   Multifocal pneumonia 05/13/2022   Bilateral primary osteoarthritis of knee 06/02/2021   Angioedema 01/01/2021   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 factor (HCC) 08/28/2020   High risk medication use 08/28/2020   Neck pain 08/19/2020   Dermatomycosis 07/01/2020   Encounter for screening fecal occult blood testing 02/26/2020   Encounter for gynecological examination with Papanicolaou smear of cervix 02/26/2020   Knee instability, left 09/06/2019  Hiatal hernia 07/17/2019   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   Back spasm 04/12/2018   Endometrial polyp 08/09/2017   Fibroids 08/09/2017   Vaginal dryness 07/18/2017   Dyslipidemia 06/27/2017   Leg edema 03/08/2017   GAD (generalized anxiety disorder) 06/01/2016   Metabolic syndrome X 07/08/2013   Erosive esophagitis 06/25/2013   GERD (gastroesophageal reflux disease) 03/22/2013   Left knee pain 03/22/2013   Snoring 08/05/2011   Urinary incontinence 08/05/2011   Vitamin D deficiency 03/21/2011   Allergic rhinitis 09/20/2010   Back pain 03/17/2010   MORBID OBESITY 02/11/2010   Essential hypertension 02/11/2010     PCP: Kerri Perches, MD   REFERRING PROVIDER: Vickki Hearing, MD   REFERRING DIAG: [phone] (ICD-10-CM) - Primary osteoarthritis of left knee  THERAPY DIAG:  Status post total left knee replacement  Muscle weakness (generalized)  Stiffness of left knee, not elsewhere classified  Rationale for Evaluation and Treatment: Rehabilitation  ONSET DATE: 05/11/23  SUBJECTIVE:   SUBJECTIVE STATEMENT: Feeling stiff today; 4/10 pain left knee; using a RW on arrival.  Wearing compression stockings bilaterally and jeans  PERTINENT HISTORY: S/pp L TKA on 05/11/23  PAIN:  Are you having pain? Yes: NPRS scale: 9-10/10 Pain location: L knee with bending  Pain description: sharp    PRECAUTIONS: Knee and Fall  RED FLAGS: None   WEIGHT BEARING RESTRICTIONS: No  FALLS:  Has patient fallen in last 6 months? 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  DIAGNOSTIC FINDINGS: N/A   PATIENT SURVEYS:  LEFS 42.5% (34/80)  COGNITION: Overall cognitive status: Within functional limits for tasks assessed     SENSATION: WFL  POSTURE: rounded shoulders and forward head  PALPATION: Unable to assess knee incision 2/2 pant style   LOWER EXTREMITY ROM:  Active ROM Right eval Left eval Right 06/01/23  Hip flexion     Hip extension     Hip abduction     Hip adduction     Hip internal rotation     Hip external rotation     Knee flexion  68 78  Knee extension  0   Ankle dorsiflexion     Ankle plantarflexion      Ankle inversion     Ankle eversion      (Blank rows = not tested)  LOWER EXTREMITY MMT:  MMT Right eval Left eval  Hip flexion WFL   Hip extension    Hip abduction [facility]   Hip adduction    Hip internal rotation    Hip external rotation    Knee flexion WFL   Knee extension WFL   Ankle dorsiflexion WFL   Ankle plantarflexion    Ankle inversion    Ankle eversion     (Blank rows = not tested)  -->14 degree extension lag with SLR   FUNCTIONAL TESTS:  5 times sit to stand: 36.75 seconds 2 minute walk test: to be completed visit #2   GAIT: Distance walked: 30' Assistive device utilized: Single point cane Level of assistance: Complete Independence  Comments: decreased stance time on L, good heel strike noted    TODAY'S TREATMENT:                                                                                                                              DATE:  06/06/23 Bike seat 13 half revolutions x 5' dynamic warm up Standing: Heel toe raises 2 x 10 Slant board 5 x 20" 12" step knee drives for flexion x 2\' 4"  box step ups 2 x 10 Sit to stand 2 x 5 using hands to assist   06/01/23 Bike seat 13 half revolutions x 5' dynamic warm up  Seated: heel slides x 2' Standing: Heel raises 2 x 10 6" step knee drives for flexion x 2' Mini squats x 10 4" step ups x 10 with 2 UE assist Gait training with SPC x 200 ft Seated knee flexion 78 AROM today    05/30/23 TE: Bike seat 13 partial revolutions x 5 minutes  Gait training 200' with SPC with focus on heel strike Standing knee flexion stretch on 2nd step 10 x 10 second hold Supine quad sets x 10 with 3 second hold   Supine heel slides x 5 - x 3 with overpressure     AROM/AAROM L knee flexion: 73/76  PATIENT EDUCATION:  Education details: HEP, POC, goals  Person educated: Patient Education method: Medical illustrator Education comprehension: verbalized understanding and returned demonstration  HOME  EXERCISE PROGRAM: Encouraged continued performance of HEP given by HHPT after review of exercises provided Access Code: [facility] URL: [email or web address] Date: 06/06/2023 Prepared by: [facility] - [facility]  Exercises - Supine Ankle Pumps  - 1 x daily - 7 x weekly - 3 sets - 15 reps - Supine Quadricep Sets  - 1 x daily - 7 x weekly - 3 sets - 15 reps - Supine Active Straight Leg Raise  - 1 x daily - 7 x weekly - 3 sets - 15 reps - Supine Heel Slides  - 1 x daily - 7 x weekly - 3 sets - 15 reps - Seated Long Arc Quad  - 1 x daily - 7 x weekly - 3 sets - 15 reps - Seated Knee Flexion Stretch  - 1 x daily - 7 x weekly - 3 sets - 15 reps - Sit to Stand with Armchair  - 2 x daily - 7 x weekly - 2 sets - 5 reps - Heel Raises with Counter Support  - 2 x daily - 7 x weekly - 2 sets - 10 reps - knee bends on the step using rails to hold onto  - 2 x daily - 7 x weekly - 2 sets - 10 reps ASSESSMENT:  CLINICAL IMPRESSION:   Late arrival today.  Wearing jeans today; asked patient to wear shorts next visit so we can do some manual with ease.  Patient continues with tightness with knee flexion.  Needs hands to assist up  to standing from chair.  Updated HEP.   Patient will benefit from continued skilled therapy services to address deficits and promote return to optimal function.      OBJECTIVE IMPAIRMENTS: Abnormal gait, decreased activity tolerance, decreased endurance, decreased mobility, difficulty walking, decreased ROM, decreased strength, hypomobility, impaired flexibility, and pain.   ACTIVITY LIMITATIONS: squatting, sleeping, stairs, and locomotion level  PARTICIPATION LIMITATIONS: cleaning, laundry, driving, community activity, and yard work  PERSONAL FACTORS: Age, Past/current experiences, and Time since onset of injury/illness/exacerbation are also affecting patient's functional outcome.   REHAB POTENTIAL: Good  CLINICAL DECISION MAKING: Stable/uncomplicated  EVALUATION COMPLEXITY:  Low   GOALS: Goals reviewed with patient? Yes  SHORT TERM GOALS: Target date: 06/14/2023  Patient will be independent in HEP to improve strength/mobility for better functional independence with ADLs. Baseline: Goal status: In progress  LONG TERM GOALS: Target date: 07/05/2023   Patient will increase lower extremity functional scale to >60/80 to demonstrate improved functional mobility and increased tolerance with ADLs.  Baseline: 11/20: 42.5% Goal status: In progress  2.  Patient will increase BLE gross strength to 4+/5 as to improve functional strength for independent gait, increased standing tolerance and increased ADL ability. Baseline:  Goal status: in progress  3.  Patient will demonstrates SLR with 0 degree extension lag to demonstrate improved quad strength.  Baseline: 11/20: 14 degree extension lag  Goal status: In progress  4.   Patient (< 41 years old) will complete five times sit to stand test in < 10 seconds indicating an increased LE strength and improved balance. Baseline: 11/20: 36.75 seconds Goal status: in progress   5.  Patient will demonstrate L knee AROM symmetrical to R knee for improve gait ability and overall mobility.  Baseline: 11/20: see above  Goal status: In progress   PLAN:  PT FREQUENCY:  1-3x/week  PT DURATION: 6 weeks  PLANNED INTERVENTIONS: 97164- PT Re-evaluation, 97110-Therapeutic exercises, 97530- Therapeutic activity, 97112- Neuromuscular re-education, 97535- Self Care, 44034- Manual therapy, 97116- Gait training, 97014- Electrical stimulation (unattended), [phone]- Electrical stimulation (manual), Patient/Family education, Stair training, Dry Needling, Joint mobilization, Joint manipulation, Scar mobilization, DME instructions, Cryotherapy, and Moist heat  PLAN FOR NEXT SESSION:  quad strengthening, knee flexion, gait with SPC   2:30 PM, 06/06/23 Zuria Fosdick Small Colbie Danner MPT [place] [facility] [place] [phone] Ph:[phone]

## 2023-06-08 ENCOUNTER — Ambulatory Visit (HOSPITAL_COMMUNITY): Payer: BC Managed Care – PPO

## 2023-06-08 DIAGNOSIS — M25662 Stiffness of left knee, not elsewhere classified: Secondary | ICD-10-CM

## 2023-06-08 DIAGNOSIS — M6281 Muscle weakness (generalized): Secondary | ICD-10-CM

## 2023-06-08 DIAGNOSIS — Z96652 Presence of left artificial knee joint: Secondary | ICD-10-CM | POA: Diagnosis not present

## 2023-06-08 NOTE — Therapy (Addendum)
OUTPATIENT PHYSICAL THERAPY LOWER EXTREMITY TREATMENT   Patient Name: Julie Ryan MRN: [id number] DOB:12-09-1965, 57 y.o., female Today's Date: 06/08/2023  END OF SESSION:  PT End of Session - 06/08/23 1321     Visit Number 6    Number of Visits 13    Date for PT Re-Evaluation 07/06/23    Authorization Type BCBS state    Progress Note Due on Visit 10    PT Start Time 1321    PT Stop Time 1405    PT Time Calculation (min) 44 min    Activity Tolerance Patient tolerated treatment well    Behavior During Therapy [facility] for tasks assessed/performed               Past Medical History:  Diagnosis Date   Angio-edema    Anxiety    Arthritis    Phreesia 08/19/2020   Carpal tunnel syndrome of right wrist    Chronic knee pain    Depression    Endometrial polyp 08/09/2017   will get appt with Dr Despina Hidden   Fibroids 08/09/2017   Has multiple small fibroids    GERD (gastroesophageal reflux disease)    Hypertension    Neuropathy    Obesity    Prediabetes    Seizures (HCC)    1 seizure as a chold; unknown etiology and has never been on meds   Sleep apnea    Past Surgical History:  Procedure Laterality Date   APPENDECTOMY     CARPAL TUNNEL RELEASE Right 08/01/2015   Procedure: RIGHT CARPAL TUNNEL RELEASE;  Surgeon: Vickki Hearing, MD;  Location: [facility];  Service: Orthopedics;  Laterality: Right;   CERVICAL POLYPECTOMY  09/14/2017   Procedure: ENDOMETRIAL POLYPECTOMY;  Surgeon: Lazaro Arms, MD;  Location: [facility];  Service: Gynecology;;   COLONOSCOPY N/A 06/15/2017   Procedure: COLONOSCOPY;  Surgeon: Malissa Hippo, MD;  [facility];  Service: Endoscopy;  Laterality: N/A;  830   ENDOMETRIAL ABLATION N/A 09/14/2017   Procedure: ENDOMETRIAL ABLATION( [place]);  Surgeon: Lazaro Arms, MD;  Location: [facility];  Service: Gynecology;  Laterality: N/A;   ESOPHAGOGASTRODUODENOSCOPY N/A 05/25/2013   Procedure: ESOPHAGOGASTRODUODENOSCOPY (EGD);  Surgeon: Malissa Hippo,  MD;  [facility]: [facility];  Service: Endoscopy;  Laterality: N/A;  220   GASTRIC BYPASS     HYSTEROSCOPY WITH D & C N/A 09/14/2017   Procedure: DILATATION AND CURETTAGE /HYSTEROSCOPY;  Surgeon: Lazaro Arms, MD;  Location: [facility];  Service: Gynecology;  Laterality: N/A;   TOTAL KNEE ARTHROPLASTY Left 05/11/2023   Procedure: TOTAL KNEE ARTHROPLASTY;  Surgeon: Vickki Hearing, MD;  Location: [facility];  Service: Orthopedics;  Laterality: Left;   TUBAL LIGATION     Patient Active Problem List   Diagnosis Date Noted   s/p left knee replacement 05/11/23 05/27/2023   Primary osteoarthritis of left knee 05/11/2023   Osteoarthritis of left knee 05/11/2023   Acute cervical adenitis 04/14/2023   COVID-19 04/04/2023   Low serum vitamin B12 12/22/2022   Shortness of breath 07/06/2022   Multifocal pneumonia 05/13/2022   Bilateral primary osteoarthritis of knee 06/02/2021   Angioedema 01/01/2021   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 factor (HCC) 08/28/2020   High risk medication use 08/28/2020   Neck pain 08/19/2020   Dermatomycosis 07/01/2020   Encounter for screening fecal occult blood testing 02/26/2020   Encounter for gynecological examination with Papanicolaou smear of cervix 02/26/2020   Knee instability, left 09/06/2019  Hiatal hernia 07/17/2019   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   Back spasm 04/12/2018   Endometrial polyp 08/09/2017   Fibroids 08/09/2017   Vaginal dryness 07/18/2017   Dyslipidemia 06/27/2017   Leg edema 03/08/2017   GAD (generalized anxiety disorder) 06/01/2016   Metabolic syndrome X 07/08/2013   Erosive esophagitis 06/25/2013   GERD (gastroesophageal reflux disease) 03/22/2013   Left knee pain 03/22/2013   Snoring 08/05/2011   Urinary incontinence 08/05/2011   Vitamin D deficiency 03/21/2011   Allergic rhinitis 09/20/2010   Back pain 03/17/2010   MORBID OBESITY 02/11/2010   Essential hypertension 02/11/2010     PCP: Kerri Perches, MD   REFERRING PROVIDER: Vickki Hearing, MD   REFERRING DIAG: [phone] (ICD-10-CM) - Primary osteoarthritis of left knee  THERAPY DIAG:  Stiffness of left knee, not elsewhere classified  Status post total left knee replacement  Muscle weakness (generalized)  Rationale for Evaluation and Treatment: Rehabilitation  ONSET DATE: 05/11/23  SUBJECTIVE:   SUBJECTIVE STATEMENT: Feeling a little stiff today; 5/10 pain left knee; using a RW on arrival. Feeling much better than last week. See surgeon on 06/13/2023  PERTINENT HISTORY: S/pp L TKA on 05/11/23  PAIN:  Are you having pain? Yes: NPRS scale: 9-10/10 Pain location: L knee with bending  Pain description: sharp    PRECAUTIONS: Knee and Fall  RED FLAGS: None   WEIGHT BEARING RESTRICTIONS: No  FALLS:  Has patient fallen in last 6 months? 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  DIAGNOSTIC FINDINGS: N/A   PATIENT SURVEYS:  LEFS 42.5% (34/80)  COGNITION: Overall cognitive status: Within functional limits for tasks assessed     SENSATION: WFL  POSTURE: rounded shoulders and forward head  PALPATION: Unable to assess knee incision 2/2 pant style   LOWER EXTREMITY ROM:  Active ROM Right eval Left eval Right 06/01/23  Hip flexion     Hip extension     Hip abduction     Hip adduction     Hip internal rotation     Hip external rotation     Knee flexion  68 78  Knee extension  0   Ankle dorsiflexion     Ankle  plantarflexion     Ankle inversion     Ankle eversion      (Blank rows = not tested)  LOWER EXTREMITY MMT:  MMT Right eval Left eval  Hip flexion WFL   Hip extension    Hip abduction [facility]   Hip adduction    Hip internal rotation    Hip external rotation    Knee flexion WFL   Knee extension WFL   Ankle dorsiflexion WFL   Ankle plantarflexion    Ankle inversion    Ankle eversion     (Blank rows = not tested)  -->14 degree extension lag with SLR   FUNCTIONAL TESTS:  5 times sit to stand: 36.75 seconds 2 minute walk test: to be completed visit #2   GAIT: Distance walked: 30' Assistive device utilized: Single point cane  Level of assistance: Complete Independence Comments: decreased stance time on L, good heel strike noted    TODAY'S TREATMENT:                                                                                                                              DATE:   06/08/23 STM over quad, calf, posterior knee, and around incision.  Quad sets x 10 SAQ on foam roll 2 x 10 3" hold Heel slides AAROM with strap x 10 Seated knee flexion and extension with manual resistance  Weight shift to single leg stance x 10  Gait training with SPC 100 feet Bike seat 13 half revolutions x 5' dynamic warm up  06/06/23 Bike seat 13 half revolutions x 5' dynamic warm up Standing: Heel toe raises 2 x 10 Slant board 5 x 20" 12" step knee drives for flexion x 2\' 4"  box step ups 2 x 10 Sit to stand 2 x 5 using hands to assist   06/01/23 Bike seat 13 half revolutions x 5' dynamic warm up  Seated: heel slides x 2' Standing: Heel raises 2 x 10 6" step knee drives for flexion x 2' Mini squats x 10 4" step ups x 10 with 2 UE assist Gait training with SPC x 200 ft Seated knee flexion 78 AROM today   05/30/23 TE: Bike seat 13 partial revolutions x 5 minutes  Gait training 200' with SPC with focus on heel strike Standing knee flexion stretch on 2nd step 10 x 10 second  hold Supine quad sets x 10 with 3 second hold   Supine heel slides x 5 - x 3 with overpressure    AROM/AAROM L knee flexion: 73/76  PATIENT EDUCATION:  Education details: HEP, POC, goals  Person educated: Patient Education method: Medical illustrator Education comprehension: verbalized understanding and returned demonstration  HOME EXERCISE PROGRAM: Encouraged continued performance of HEP given by HHPT after review of exercises provided Access Code: [facility] URL: [email or web address] Date: 06/06/2023 Prepared by: [facility] - Rehab  Exercises - Supine Ankle Pumps  - 1 x daily - 7 x weekly - 3 sets - 15 reps - Supine Quadricep Sets  - 1 x daily - 7 x weekly - 3 sets - 15 reps - Supine Active Straight Leg Raise  - 1 x daily - 7 x weekly - 3 sets - 15 reps - Supine Heel Slides  - 1 x daily - 7 x weekly - 3 sets - 15 reps - Seated Long Arc Quad  - 1 x daily - 7 x weekly - 3 sets - 15 reps - Seated Knee Flexion Stretch  - 1 x daily - 7 x weekly - 3 sets - 15 reps - Sit to Stand with Armchair  - 2 x daily - 7 x weekly - 2 sets - 5 reps - Heel Raises with Counter Support  - 2 x daily - 7 x weekly - 2 sets -  10 reps - knee bends on the step using rails to hold onto  - 2 x daily - 7 x weekly - 2 sets - 10 reps  ASSESSMENT:  CLINICAL IMPRESSION:   Patient wore shorts today so session started with some manual STM. Quad sets revealed some difficulty with end range quad activation. Also, demonstrates a minor extension lag with SAQ. End range quad weakness was also noticed during seated knee flex/ext with manual resistance. Patient fatigues quickly. Walking with SPC is safe, symmetrical, and controlled. Patient encouraged to increase use of SPC and decrease use of walker at home and in the community. Patient continues to demonstrate tightness with knee flexion.   Patient will benefit from continued skilled therapy services to address deficits and promote return to optimal  function.      OBJECTIVE IMPAIRMENTS: Abnormal gait, decreased activity tolerance, decreased endurance, decreased mobility, difficulty walking, decreased ROM, decreased strength, hypomobility, impaired flexibility, and pain.   ACTIVITY LIMITATIONS: squatting, sleeping, stairs, and locomotion level  PARTICIPATION LIMITATIONS: cleaning, laundry, driving, community activity, and yard work  PERSONAL FACTORS: Age, Past/current experiences, and Time since onset of injury/illness/exacerbation are also affecting patient's functional outcome.   REHAB POTENTIAL: Good  CLINICAL DECISION MAKING: Stable/uncomplicated  EVALUATION COMPLEXITY: Low   GOALS: Goals reviewed with patient? Yes  SHORT TERM GOALS: Target date: 06/14/2023  Patient will be independent in HEP to improve strength/mobility for better functional independence with ADLs. Baseline: Goal status: In progress  LONG TERM GOALS: Target date: 07/05/2023   Patient will increase lower extremity functional scale to >60/80 to demonstrate improved functional mobility and increased tolerance with ADLs.  Baseline: 11/20: 42.5% Goal status: In progress  2.  Patient will increase BLE gross strength to 4+/5 as to improve functional strength for independent gait, increased standing tolerance and increased ADL ability. Baseline:  Goal status: in progress  3.  Patient will demonstrates SLR with 0 degree extension lag to demonstrate improved quad strength.  Baseline: 11/20: 14 degree extension lag  Goal status: In progress  4.   Patient (< 32 years old) will complete five times sit to stand test in < 10 seconds indicating an increased LE strength and improved balance. Baseline: 11/20: 36.75 seconds Goal status: in progress   5.  Patient will demonstrate L knee AROM symmetrical to R knee for improve gait ability and overall mobility.  Baseline: 11/20: see above  Goal status: In progress   PLAN:  PT FREQUENCY:  1-3x/week  PT  DURATION: 6 weeks  PLANNED INTERVENTIONS: 97164- PT Re-evaluation, 97110-Therapeutic exercises, 97530- Therapeutic activity, 97112- Neuromuscular re-education, 97535- Self Care, 16109- Manual therapy, 97116- Gait training, 97014- Electrical stimulation (unattended), [phone]- Electrical stimulation (manual), Patient/Family education, Stair training, Dry Needling, Joint mobilization, Joint manipulation, Scar mobilization, DME instructions, Cryotherapy, and Moist heat  PLAN FOR NEXT SESSION:  end range quad strengthening, knee flexion ROM, gait with SPC   1:22 PM, 06/08/23 Karna Christmas, SPT  " I agree with the following treatment note after reviewing documentation. This session was performed under the supervision of a licensed clinician."  2:33 PM, 06/08/23 Amy Small Lynch MPT [facility] [place] [phone]

## 2023-06-09 NOTE — Progress Notes (Signed)
Office Visit Note  Patient: Julie Ryan             Date of Birth: 01-31-1966           MRN: [id number]             PCP: Kerri Perches, MD Referring: Kerri Perches, MD Visit Date: 06/21/2023   Subjective:  Follow-up (Patient states she has stopped taking Enbrel when she had her surgery. Patient states she has started her Enbrel again and it makes her really sore in her knees and tired for 2-3 days. )    Discussed the use of AI scribe software for clinical note transcription with the patient, who gave verbal consent to proceed.  History of Present Illness   Julie Ryan is a 57 y.o. female here for follow up for seronegative RA on Enbrel 50 mg subcu weekly.  She recently resumed Enbrel after a temporary discontinuation due to knee surgery. They report experiencing side effects post-dose, including joint soreness, fatigue, and irritability. These symptoms persist for a few days after each dose, and the patient has noticed a pattern of discomfort following the two doses they have taken since resuming the medication.  The patient denies any increase in joint swelling or pain in their hands or other knee post-surgery, apart from the expected post-operative swelling in the operated knee. They continue to take diclofenac twice daily for arthritis management.  In addition to their rheumatoid arthritis, the patient has a history of double pneumonia, which was diagnosed and treated last November. They are currently undergoing physical therapy for their knee post-surgery, which they report is progressing well.  The patient's arthritis appears to be well-controlled with their current medication regimen, apart from the reported side effects of Enbrel. They have not experienced any flare-ups since discontinuing Enbrel, and their joint swelling is minimal.    Previous HPI 12/13/2022 Julie Ryan is a 57 y.o. female here for follow up for seronegative RA on Enbrel 50 mg subcu weekly.  Hands are doing very well. Since the pneumonia last November she has no new infections or antibiotic treatment. Joint pain is just bothering her in both knees. Had recent visco injections in both without seeing great relief. She needs her repeat labs checked.   Previous HPI 06/23/22 Julie Ryan is a 57 y.o. female here for follow up for seropositive RA on Enbrel 50 mg [place] weekly.  Since her last visit arthritis symptoms have been pretty stable still gets some significant knee pain worse with any prolonged walking and standing.  She went to the emergency department last month due to development of chest wall pain and fevers and was found to have multifocal pneumonia.  She completed antibiotics treatment for this and symptoms have mostly resolved though she still has a little bit of worsening shortness of breath and some anterior chest wall pain.  She is continued her Enbrel treatment consistently throughout the whole time.   Previous HPI 12/10/21 Julie Ryan is a 57 y.o. female here for follow up for seropositive RA on Enbrel 50 mg [place] weekly.  She is feeling well today. About 3 weeks ago saw Dr. Romeo Apple for bilateral knee steroid injections due to increased pain and some swelling in both knees for a week. Last week developed upper back pain only partially helped with flexeril and was prescribed medrol pack by Dr. Allena Katz last dose tomorrow, and symptoms improved. She notices knee pain sometimes after Enbrel injection if she  does it during the day, okay at night.   Previous HPI 09/01/2021 Julie Ryan is a 57 y.o. female here for follow up for seropositive RA on Enbrel 50 mg [place] weekly. She is doing well overall and symptoms are controlled. She still has bilateral knee pain and stiffness. Morning stiffness about 20 minutes duration. 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 Pain is worse with rainy weather and cold. She takes tylenol 2-3 times per day for the knee pain. Not much  associated swelling that she has noticed. Knee will catch or pop partway through movement about twice per week, this has never caused her to fall.   Previous HPI 06/02/21 Julie Ryan is a 57 y.o. female here for follow up for seropositive rheumatoid arthritis on Enbrel 50 mg subcu weekly.  Most of her symptoms are doing well but continues having considerable pain in bilateral knees.  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  Has not been much associated swelling.  Symptoms elsewhere are doing very well.  She has not had any major infection or other side effects.   Previous HPI: Julie Ryan is a 57 y.o. female here for follow up for seropositive rheumatoid arthritis.  She previously started subcutaneous methotrexate 15 mg weekly and had described a improvement in her joint pain swelling and stiffness especially in the bilateral hands but this medicine was stopped due to a considerable rise in transaminase levels on repeat labs.  She has been off any medicine for over 2 weeks now and she does feel like her joint pain and stiffness is getting worse again.   Review of Systems  Constitutional:  Positive for fatigue.  HENT:  Positive for mouth dryness. Negative for mouth sores.   Eyes:  Negative for dryness.  Respiratory:  Positive for shortness of breath.   Cardiovascular:  Negative for chest pain and palpitations.  Gastrointestinal:  Negative for blood in stool, constipation and diarrhea.  Endocrine: Negative for increased urination.  Genitourinary:  Negative for involuntary urination.  Musculoskeletal:  Positive for joint pain, gait problem, joint pain, muscle weakness and morning stiffness. Negative for joint swelling, myalgias, muscle tenderness and myalgias.  Skin:  Negative for color change, rash, hair loss and sensitivity to sunlight.  Allergic/Immunologic:  Negative for susceptible to infections.  Neurological:  Negative for dizziness and headaches.  Hematological:  Negative for swollen glands.  Psychiatric/Behavioral:  Positive for depressed mood. Negative for sleep disturbance. The patient is not nervous/anxious.     PMFS History:  Patient Active Problem List   Diagnosis Date Noted   s/p left knee replacement 05/11/23 05/27/2023   Primary osteoarthritis of left knee 05/11/2023   Osteoarthritis of left knee 05/11/2023   COVID-19 04/04/2023   Low serum vitamin B12 12/22/2022   Shortness of breath 07/06/2022   Multifocal pneumonia 05/13/2022   Bilateral primary osteoarthritis of knee 06/02/2021   Angioedema 01/01/2021   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 factor (HCC) 08/28/2020   High risk medication use 08/28/2020   Neck pain 08/19/2020   Dermatomycosis 07/01/2020   Encounter for screening fecal occult blood testing 02/26/2020   Encounter for gynecological examination with Papanicolaou smear of cervix 02/26/2020   Knee instability, left 09/06/2019   Hiatal hernia 07/17/2019   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   Back spasm 04/12/2018  Endometrial polyp 08/09/2017   Fibroids 08/09/2017   Vaginal dryness 07/18/2017   Dyslipidemia 06/27/2017   Leg edema 03/08/2017   GAD (generalized anxiety disorder) 06/01/2016   Metabolic syndrome X 07/08/2013   Erosive esophagitis 06/25/2013   GERD (gastroesophageal reflux disease) 03/22/2013   Left knee pain 03/22/2013   Snoring 08/05/2011   Urinary incontinence 08/05/2011   Vitamin D deficiency 03/21/2011   Allergic rhinitis 09/20/2010   Back pain 03/17/2010   MORBID OBESITY 02/11/2010   Essential hypertension 02/11/2010    Past Medical History:  Diagnosis Date   Angio-edema    Anxiety    Arthritis    [employer] 08/19/2020   Carpal tunnel syndrome of right wrist    Chronic knee pain    Depression    Endometrial polyp 08/09/2017   will get  appt with Dr Despina Hidden   Fibroids 08/09/2017   Has multiple small fibroids    GERD (gastroesophageal reflux disease)    Hypertension    Neuropathy    Obesity    Prediabetes    Seizures (HCC)    1 seizure as a chold; unknown etiology and has never been on meds   Sleep apnea     Family History  Problem Relation Age of Onset   Hypertension Mother    Heart disease Mother    Diabetes Mother    Diabetes Sister    Multiple sclerosis Sister    Lupus Sister    Heart disease Sister    CVA Sister    Depression Sister    Diabetes Brother    Hypertension Brother    Other Son        brain tumor   Hypertension Daughter    Diabetes Sister    Past Surgical History:  Procedure Laterality Date   APPENDECTOMY     CARPAL TUNNEL RELEASE Right 08/01/2015   Procedure: RIGHT CARPAL TUNNEL RELEASE;  Surgeon: Vickki Hearing, MD;  Location: [facility];  Service: Orthopedics;  Laterality: Right;   CERVICAL POLYPECTOMY  09/14/2017   Procedure: ENDOMETRIAL POLYPECTOMY;  Surgeon: Lazaro Arms, MD;  Location: [facility];  Service: Gynecology;;   COLONOSCOPY N/A 06/15/2017   Procedure: COLONOSCOPY;  Surgeon: Malissa Hippo, MD;  Location: [facility];  Service: Endoscopy;  Laterality: N/A;  830   ENDOMETRIAL ABLATION N/A 09/14/2017   Procedure: ENDOMETRIAL ABLATION( [place]);  Surgeon: Lazaro Arms, MD;  Location: [facility];  Service: Gynecology;  Laterality: N/A;   ESOPHAGOGASTRODUODENOSCOPY N/A 05/25/2013   Procedure: ESOPHAGOGASTRODUODENOSCOPY (EGD);  Surgeon: Malissa Hippo, MD;  Location: [facility];  Service: Endoscopy;  Laterality: N/A;  220   GASTRIC BYPASS     HYSTEROSCOPY WITH D & C N/A 09/14/2017   Procedure: DILATATION AND CURETTAGE /HYSTEROSCOPY;  Surgeon: Lazaro Arms, MD;  Location: [facility];  Service: Gynecology;  Laterality: N/A;   TOTAL KNEE ARTHROPLASTY Left 05/11/2023   Procedure: TOTAL KNEE ARTHROPLASTY;  Surgeon: Vickki Hearing, MD;  Location: [facility];  Service: Orthopedics;   Laterality: Left;   TUBAL LIGATION     Social History   Social History Narrative   Not on file   Immunization History  Administered Date(s) Administered   Influenza Whole 03/18/2011   Influenza, Seasonal, Injecte, Preservative Fre 03/03/2023   Influenza,inj,Quad PF,6+ Mos 03/22/2013, 07/31/2015, 06/01/2016, 02/26/2017, 04/12/2018, 05/07/2019, 06/26/2020, 05/13/2021   Influenza-Unspecified 02/26/2017   Moderna Sars-Covid-2 Vaccination 12/05/2019, 01/02/2020, 08/01/2020   PPD Test 07/04/2013, 07/26/2013, 03/15/2016, 03/02/2017, 03/21/2018, 12/17/2019   Td 06/24/2010   Tdap  08/20/2020   Zoster Recombinant(Shingrix) 02/07/2020, 05/01/2020     Objective: Vital Signs: BP 135/76 (BP Location: Right Arm, Patient Position: Sitting, Cuff Size: Normal)   Pulse 88   Resp 14   Ht 5\' 6"  (1.676 m)   Wt 213 lb (96.6 kg)   LMP 08/31/2017 (Approximate)   BMI 34.38 kg/m    Physical Exam Constitutional:      Appearance: She is obese.  Eyes:     Conjunctiva/sclera: Conjunctivae normal.  Cardiovascular:     Rate and Rhythm: Normal rate and regular rhythm.  Pulmonary:     Effort: Pulmonary effort is normal.     Breath sounds: Normal breath sounds.  Musculoskeletal:     Right lower leg: No edema.     Left lower leg: No edema.  Lymphadenopathy:     Cervical: No cervical adenopathy.  Skin:    General: Skin is warm and dry.     Findings: No rash.  Neurological:     Mental Status: She is alert.  Psychiatric:        Mood and Affect: Mood normal.      Musculoskeletal Exam:  Shoulders full ROM no tenderness or swelling Elbows full ROM no tenderness or swelling Wrists full ROM no tenderness or swelling Fingers full ROM no tenderness or swelling Hip normal internal and external rotation without pain, no tenderness to lateral hip palpation Left knee full extension, slightly past 90 degrees flexion, trace effusion Right knee crepitus with good ROM, no palpable effusion Ankles full ROM no  tenderness or swelling   Investigation: No additional findings.  Imaging: No results found.  Recent Labs: Lab Results  Component Value Date   WBC 8.1 06/21/2023   HGB 12.9 06/21/2023   PLT 354 06/21/2023   NA 141 06/21/2023   K 4.1 06/21/2023   CL 104 06/21/2023   CO2 30 06/21/2023   GLUCOSE 102 (H) 06/21/2023   BUN 11 06/21/2023   CREATININE 0.95 06/21/2023   BILITOT 0.6 06/21/2023   ALKPHOS 115 08/26/2021   AST 13 06/21/2023   ALT 14 06/21/2023   PROT 7.2 06/21/2023   ALBUMIN 4.4 08/26/2021   CALCIUM 9.9 06/21/2023   GFRAA 76 01/07/2021   QFTBGOLDPLUS NEGATIVE 12/13/2022    Speciality Comments: No specialty comments available.  Procedures:  No procedures performed Allergies: Codeine, Effexor xr [venlafaxine hcl er], Fluoxetine, Hydrocodone, Methotrexate derivatives, and Latex   Assessment / Plan:     Visit Diagnoses: 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 factor (HCC) - Is taking diclofenac for knee pain - Plan: Sedimentation rate Patient reports increased joint soreness, fatigue, and irritability after taking Enbrel. No increase in joint swelling noted after a month off Enbrel due to knee surgery. Currently on diclofenac twice daily. -Continue diclofenac 75 mg twice daily for now, with a long-term goal of reducing to once daily due to potential risk of digestive tract irritation. -Discontinue Enbrel due to side effects. -Check sed rate today. If significantly elevated, consider starting an alternative treatment sooner to prevent flare-ups. -Monitor for increased joint swelling or pain. If symptoms worsen, consider alternative treatments for rheumatoid arthritis.  High risk medication use - Enbrel 50 mg [place] weekly. - Plan: CBC with Differential/Platelet, COMPLETE METABOLIC PANEL WITH GFR Had episode of pneumonia last year. -Checking CBC and CMP for medication monitoring on Enbrel and high dose NSAIDs  Bilateral primary osteoarthritis of  knee Post Knee Surgery Patient is in physical therapy and reports progress. Swelling still present in surgical  knee.   Orders: Orders Placed This Encounter  Procedures   Sedimentation rate   CBC with Differential/Platelet   COMPLETE METABOLIC PANEL WITH GFR   No orders of the defined types were placed in this encounter.    Follow-Up Instructions: Return in about 3 months (around 09/19/2023) for RA stop ENB f/u 3mos.   Fuller Plan, MD  Note - This record has been created using [employer].  Chart creation errors have been sought, but may not always  have been located. Such creation errors do not reflect on  the standard of medical care.

## 2023-06-10 ENCOUNTER — Ambulatory Visit (HOSPITAL_COMMUNITY): Payer: BC Managed Care – PPO

## 2023-06-10 ENCOUNTER — Encounter (HOSPITAL_COMMUNITY): Payer: Self-pay

## 2023-06-10 DIAGNOSIS — Z96652 Presence of left artificial knee joint: Secondary | ICD-10-CM | POA: Diagnosis not present

## 2023-06-10 DIAGNOSIS — M25662 Stiffness of left knee, not elsewhere classified: Secondary | ICD-10-CM

## 2023-06-10 DIAGNOSIS — M6281 Muscle weakness (generalized): Secondary | ICD-10-CM

## 2023-06-10 NOTE — Therapy (Signed)
OUTPATIENT PHYSICAL THERAPY LOWER EXTREMITY TREATMENT   Patient Name: Julie Ryan MRN: [id number] DOB:10-21-1965, 56 y.o., female Today's Date: 06/10/2023  END OF SESSION:  PT End of Session - 06/10/23 1302     Visit Number 7    Number of Visits 16    Date for PT Re-Evaluation 07/06/23    Authorization Type [employer] state    Progress Note Due on Visit 10    PT Start Time 1302    PT Stop Time 1345    PT Time Calculation (min) 43 min    Activity Tolerance Patient tolerated treatment well    Behavior During Therapy [facility] for tasks assessed/performed               Past Medical History:  Diagnosis Date   Angio-edema    Anxiety    Arthritis    Phreesia 08/19/2020   Carpal tunnel syndrome of right wrist    Chronic knee pain    Depression    Endometrial polyp 08/09/2017   will get appt with Dr Despina Hidden   Fibroids 08/09/2017   Has multiple small fibroids    GERD (gastroesophageal reflux disease)    Hypertension    Neuropathy    Obesity    Prediabetes    Seizures (HCC)    1 seizure as a chold; unknown etiology and has never been on meds   Sleep apnea    Past Surgical History:  Procedure Laterality Date   APPENDECTOMY     CARPAL TUNNEL RELEASE Right 08/01/2015   Procedure: RIGHT CARPAL TUNNEL RELEASE;  Surgeon: Vickki Hearing, MD;  Location: [facility];  Service: Orthopedics;  Laterality: Right;   CERVICAL POLYPECTOMY  09/14/2017   Procedure: ENDOMETRIAL POLYPECTOMY;  Surgeon: Lazaro Arms, MD;  Location: [facility];  Service: Gynecology;;   COLONOSCOPY N/A 06/15/2017   Procedure: COLONOSCOPY;  Surgeon: Malissa Hippo, MD;  Location: [facility];  Service: Endoscopy;  Laterality: N/A;  830   ENDOMETRIAL ABLATION N/A 09/14/2017   Procedure: ENDOMETRIAL ABLATION( [place]);  Surgeon: Lazaro Arms, MD;  Location: [facility];  Service: Gynecology;  Laterality: N/A;   ESOPHAGOGASTRODUODENOSCOPY N/A 05/25/2013   Procedure: ESOPHAGOGASTRODUODENOSCOPY (EGD);  Surgeon: Malissa Hippo,  MD;  Location: [facility];  Service: Endoscopy;  Laterality: N/A;  220   GASTRIC BYPASS     HYSTEROSCOPY WITH D & C N/A 09/14/2017   Procedure: DILATATION AND CURETTAGE /HYSTEROSCOPY;  Surgeon: Lazaro Arms, MD;  Location: [facility];  Service: Gynecology;  Laterality: N/A;   TOTAL KNEE ARTHROPLASTY Left 05/11/2023   Procedure: TOTAL KNEE ARTHROPLASTY;  Surgeon: Vickki Hearing, MD;  Location: [facility];  Service: Orthopedics;  Laterality: Left;   TUBAL LIGATION     Patient Active Problem List   Diagnosis Date Noted   s/p left knee replacement 05/11/23 05/27/2023   Primary osteoarthritis of left knee 05/11/2023   Osteoarthritis of left knee 05/11/2023   Acute cervical adenitis 04/14/2023   COVID-19 04/04/2023   Low serum vitamin B12 12/22/2022   Shortness of breath 07/06/2022   Multifocal pneumonia 05/13/2022   Bilateral primary osteoarthritis of knee 06/02/2021   Angioedema 01/01/2021   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 factor (HCC) 08/28/2020   High risk medication use 08/28/2020   Neck pain 08/19/2020   Dermatomycosis 07/01/2020   Encounter for screening fecal occult blood testing 02/26/2020   Encounter for gynecological examination with Papanicolaou smear of cervix 02/26/2020   Knee instability, left 09/06/2019  Hiatal hernia 07/17/2019   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   Back spasm 04/12/2018   Endometrial polyp 08/09/2017   Fibroids 08/09/2017   Vaginal dryness 07/18/2017   Dyslipidemia 06/27/2017   Leg edema 03/08/2017   GAD (generalized anxiety disorder) 06/01/2016   Metabolic syndrome X 07/08/2013   Erosive esophagitis 06/25/2013   GERD (gastroesophageal reflux disease) 03/22/2013   Left knee pain 03/22/2013   Snoring 08/05/2011   Urinary incontinence 08/05/2011   Vitamin D deficiency 03/21/2011   Allergic rhinitis 09/20/2010   Back pain 03/17/2010   MORBID OBESITY 02/11/2010   Essential hypertension 02/11/2010     PCP: Kerri Perches, MD   REFERRING PROVIDER: Vickki Hearing, MD   REFERRING DIAG: [phone] (ICD-10-CM) - Primary osteoarthritis of left knee  THERAPY DIAG:  Stiffness of left knee, not elsewhere classified  Status post total left knee replacement  Muscle weakness (generalized)  Rationale for Evaluation and Treatment: Rehabilitation  ONSET DATE: 05/11/23  SUBJECTIVE:   SUBJECTIVE STATEMENT: Knee is stiff today, pain scale 4/10.  Walks with SPC home and continues with RW outdoors.  Returns to surgeon on 06/13/23.  PERTINENT HISTORY: S/pp L TKA on 05/11/23  PAIN:  Are you having pain? Yes: NPRS scale: 9-10/10 Pain location: L knee with bending  Pain description: sharp    PRECAUTIONS: Knee and Fall  RED FLAGS: None   WEIGHT BEARING RESTRICTIONS: No  FALLS:  Has patient fallen in last 6 months? 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  DIAGNOSTIC FINDINGS: N/A   PATIENT SURVEYS:  LEFS 42.5% (34/80)  COGNITION: Overall cognitive status: Within functional limits for tasks assessed     SENSATION: WFL  POSTURE: rounded shoulders and forward head  PALPATION: Unable to assess knee incision 2/2 pant style   LOWER EXTREMITY ROM:  Active ROM Right eval Left eval Right 06/01/23  Hip flexion     Hip extension     Hip abduction     Hip adduction     Hip internal rotation     Hip external rotation     Knee flexion  68 78  Knee extension  0   Ankle dorsiflexion     Ankle plantarflexion      Ankle inversion     Ankle eversion      (Blank rows = not tested)  LOWER EXTREMITY MMT:  MMT Right eval Left eval  Hip flexion WFL   Hip extension    Hip abduction [facility]   Hip adduction    Hip internal rotation    Hip external rotation    Knee flexion WFL   Knee extension WFL   Ankle dorsiflexion WFL   Ankle plantarflexion    Ankle inversion    Ankle eversion     (Blank rows = not tested)  -->14 degree extension lag with SLR   FUNCTIONAL TESTS:  5 times sit to stand: 36.75 seconds 2 minute walk test: to be completed visit #2   GAIT: Distance walked: 30' Assistive device utilized: Single point cane Level  of assistance: Complete Independence Comments: decreased stance time on L, good heel strike noted    TODAY'S TREATMENT:                                                                                                                              DATE:  06/10/23:  Bike seat 13 half revolutions x 5' dynamic warm up with SPC 261ft (Ambulated with SPC through session). Knee drive on 21HY step 5x 20"   Supine Quad set 10x SLR with quad set prior raise- extension lag SAQ with half bolster 10x Bridge 5x 5"  Manual decongestive techniques with LE elevated  Prone:  Quad stretch with rope 3x 30"  AROM at EOS: 3-81 degrees  06/08/23 STM over quad, calf, posterior knee, and around incision.  Quad sets x 10 SAQ on foam roll 2 x 10 3" hold Heel slides AAROM with strap x 10 Seated knee flexion and extension with manual resistance  Weight shift to single leg stance x 10  Gait training with SPC 100 feet Bike seat 13 half revolutions x 5' dynamic warm up  06/06/23 Bike seat 13 half revolutions x 5' dynamic warm up Standing: Heel toe raises 2 x 10 Slant board 5 x 20" 12" step knee drives for flexion x 2\' 4"  box step ups 2 x 10 Sit to stand 2 x 5 using hands to assist   06/01/23 Bike seat 13 half revolutions x 5' dynamic warm up  Seated: heel slides x  2' Standing: Heel raises 2 x 10 6" step knee drives for flexion x 2' Mini squats x 10 4" step ups x 10 with 2 UE assist Gait training with SPC x 200 ft Seated knee flexion 78 AROM today   05/30/23 TE: Bike seat 13 partial revolutions x 5 minutes  Gait training 200' with SPC with focus on heel strike Standing knee flexion stretch on 2nd step 10 x 10 second hold Supine quad sets x 10 with 3 second hold   Supine heel slides x 5 - x 3 with overpressure    AROM/AAROM L knee flexion: 73/76  PATIENT EDUCATION:  Education details: HEP, POC, goals  Person educated: Patient Education method: Medical illustrator Education comprehension: verbalized understanding and returned demonstration  HOME EXERCISE PROGRAM: Encouraged continued performance of HEP given by HHPT after review of exercises provided Access Code: [facility] URL: [email or web address] Date: 06/06/2023 Prepared by: [facility] - [facility]  Exercises - Supine Ankle Pumps  - 1 x daily - 7 x weekly - 3 sets - 15 reps - Supine Quadricep Sets  - 1 x daily - 7 x weekly - 3 sets - 15 reps - Supine Active Straight Leg Raise  - 1 x daily - 7 x weekly - 3 sets - 15 reps - Supine Heel Slides  - 1 x daily - 7 x weekly - 3 sets - 15 reps - Seated Long Arc Quad  - 1 x  daily - 7 x weekly - 3 sets - 15 reps - Seated Knee Flexion Stretch  - 1 x daily - 7 x weekly - 3 sets - 15 reps - Sit to Stand with Armchair  - 2 x daily - 7 x weekly - 2 sets - 5 reps - Heel Raises with Counter Support  - 2 x daily - 7 x weekly - 2 sets - 10 reps - knee bends on the step using rails to hold onto  - 2 x daily - 7 x weekly - 2 sets - 10 reps  06/10/23: - Supine Short Arc Quad  - 2 x daily - 7 x weekly - 2 sets - 10 reps - 5" hold - Straight Leg Raise  - 2 x daily - 7 x weekly - 1 sets - 10 reps - Supine Bridge  - 1 x daily - 7 x weekly - 3 sets - 10 reps - Prone Quadriceps Stretch with Strap  - 2 x daily - 7 x weekly - 3 sets - 3 reps - 30"  hold  ASSESSMENT:  CLINICAL IMPRESSION:   Session focus on knee mobility and proximal strengthening.  Added SLR and SAQ with half bolster for quad strengthening with visible musculature fatigue and extension lag present due to quad weakness.  Added these exercises to HEP.  Prone quad stretch to improve flexion.  Manual decongestive techniques to address edema control.  Discussed benefits with compression garments for edema control.  AROM 3-81 degrees at EOS   OBJECTIVE IMPAIRMENTS: Abnormal gait, decreased activity tolerance, decreased endurance, decreased mobility, difficulty walking, decreased ROM, decreased strength, hypomobility, impaired flexibility, and pain.   ACTIVITY LIMITATIONS: squatting, sleeping, stairs, and locomotion level  PARTICIPATION LIMITATIONS: cleaning, laundry, driving, community activity, and yard work  PERSONAL FACTORS: Age, Past/current experiences, and Time since onset of injury/illness/exacerbation are also affecting patient's functional outcome.   REHAB POTENTIAL: Good  CLINICAL DECISION MAKING: Stable/uncomplicated  EVALUATION COMPLEXITY: Low   GOALS: Goals reviewed with patient? Yes  SHORT TERM GOALS: Target date: 06/14/2023  Patient will be independent in HEP to improve strength/mobility for better functional independence with ADLs. Baseline: Goal status: In progress  LONG TERM GOALS: Target date: 07/05/2023   Patient will increase lower extremity functional scale to >60/80 to demonstrate improved functional mobility and increased tolerance with ADLs.  Baseline: 11/20: 42.5% Goal status: In progress  2.  Patient will increase BLE gross strength to 4+/5 as to improve functional strength for independent gait, increased standing tolerance and increased ADL ability. Baseline:  Goal status: in progress  3.  Patient will demonstrates SLR with 0 degree extension lag to demonstrate improved quad strength.  Baseline: 11/20: 14 degree extension lag   Goal status: In progress  4.   Patient (< 52 years old) will complete five times sit to stand test in < 10 seconds indicating an increased LE strength and improved balance. Baseline: 11/20: 36.75 seconds Goal status: in progress   5.  Patient will demonstrate L knee AROM symmetrical to R knee for improve gait ability and overall mobility.  Baseline: 11/20: see above  Goal status: In progress   PLAN:  PT FREQUENCY:  1-3x/week  PT DURATION: 6 weeks  PLANNED INTERVENTIONS: 97164- PT Re-evaluation, 97110-Therapeutic exercises, 97530- Therapeutic activity, 97112- Neuromuscular re-education, 97535- Self Care, 78295- Manual therapy, [phone]- Gait training, 97014- Electrical stimulation (unattended), [phone]- Electrical stimulation (manual), Patient/Family education, Stair training, Dry Needling, Joint mobilization, Joint manipulation, Scar mobilization, DME instructions, Cryotherapy, and Moist  heat  PLAN FOR NEXT SESSION:  end range quad strengthening, knee flexion ROM, gait with SPC   4:23 PM, 06/10/23  Becky Sax, LPTA/CLT; CBIS [phone]  Juel Burrow, PTA 06/10/2023, 4:23 PM  4:23 PM, 06/10/23

## 2023-06-12 NOTE — Assessment & Plan Note (Signed)
Controlled, no change in medication DASH diet and commitment to daily physical activity for a minimum of 30 minutes discussed and encouraged, as a part of hypertension management. The importance of attaining a healthy weight is also discussed.     06/01/2023    1:06 PM 05/12/2023   12:55 PM 05/12/2023    3:41 AM 05/11/2023   11:20 PM 05/11/2023   10:00 PM 05/11/2023    7:39 PM 05/11/2023    3:21 PM  BP/Weight  Systolic BP 129 126 118 135 123 115 160  Diastolic BP 80 74 54 58 62 68 77  Wt. (Lbs) 212.08        BMI 34.23 kg/m2

## 2023-06-12 NOTE — Assessment & Plan Note (Signed)
Continue weekly supplement 

## 2023-06-12 NOTE — Progress Notes (Signed)
Julie Ryan     MRN: [id number]      DOB: [birth year]  Chief Complaint  Patient presents with   Follow-up    Follow up recent knee surgery    HPI Julie Ryan is here for follow up and re-evaluation of chronic medical conditions, medication management and review of any available recent lab and radiology data.  Preventive health is updated, specifically  Cancer screening and Immunization.   Recovering well from right knee replacement on 05/11/2023 Weight los is slowed due to lack of mobility, but anticipated improvement with increased movement Does admit to over eating sweets and knows this needs to stop The PT denies any adverse reactions to current medications since the last visit.  There are no new concerns.  There are no specific complaints   ROS Denies recent fever or chills. Denies sinus pressure, nasal congestion, ear pain or sore throat. Denies chest congestion, productive cough or wheezing. Denies chest pains, palpitations and leg swelling Denies abdominal pain, nausea, vomiting,diarrhea or constipation.   Denies dysuria, frequency, hesitancy or incontinence. Denies uncontrolled  joint pain, swelling and limitation in mobility. Denies headaches, seizures, numbness, or tingling. Denies depression, anxiety or insomnia. Denies skin break down or rash.   PE  BP 129/80 (BP Location: Right Arm, Patient Position: Sitting, Cuff Size: Large)   Pulse 67   Ht 5\' 6"  (1.676 m)   Wt 212 lb 1.3 oz (96.2 kg)   LMP 08/31/2017 (Approximate)   SpO2 96%   BMI 34.23 kg/m   Patient alert and oriented and in no cardiopulmonary distress.  HEENT: No facial asymmetry, EOMI,     Neck supple .  Chest: Clear to auscultation bilaterally.  CVS: S1, S2 no murmurs, no S3.Regular rate.  ABD: Soft non tender.   Ext: No edema  MS: Adequate ROM spine, shoulders, hips and reduced  knees.  Skin: Intact, no ulcerations or rash noted.  Psych: Good eye contact, normal affect. Memory intact  not anxious or depressed appearing.  CNS: CN 2-12 intact, power,  normal throughout.no focal deficits noted.   Assessment & Plan Essential hypertension Controlled, no change in medication DASH diet and commitment to daily physical activity for a minimum of 30 minutes discussed and encouraged, as a part of hypertension management. The importance of attaining a healthy weight is also discussed.     06/01/2023    1:06 PM 05/12/2023   12:55 PM 05/12/2023    3:41 AM 05/11/2023   11:20 PM 05/11/2023   10:00 PM 05/11/2023    7:39 PM 05/11/2023    3:21 PM  BP/Weight  Systolic BP 129 126 118 135 123 115 160  Diastolic BP 80 74 54 58 62 68 77  Wt. (Lbs) 212.08        BMI 34.23 kg/m2             MORBID OBESITY  Patient re-educated about  the importance of commitment to a  minimum of 150 minutes of exercise per week as able.  The importance of healthy food choices with portion control discussed, as well as eating regularly and within a 12 hour window most days. The need to choose "clean , green" food 50 to 75% of the time is discussed, as well as to make water the primary drink and set a goal of 64 ounces water daily.       06/01/2023    1:06 PM 05/11/2023    9:14 AM 05/11/2023    7:16 AM  Weight /  BMI  Weight 212 lb 1.3 oz 214 lb 1.1 oz 214 lb 1.1 oz  Height 5\' 6"  (1.676 m) 5\' 6"  (1.676 m) 5\' 6"  (1.676 m)  BMI 34.23 kg/m2 34.55 kg/m2 34.55 kg/m2    Continue phentermine at current dose and cgange food choice  Dyslipidemia Hyperlipidemia:Low fat diet discussed and encouraged.   Lipid Panel  Lab Results  Component Value Date   CHOL 190 12/16/2022   HDL 57 12/16/2022   LDLCALC 117 (H) 12/16/2022   TRIG 88 12/16/2022   CHOLHDL 3.3 12/16/2022     Neds to euce fat in diet  Vitamin D deficiency Continue weekly supplement  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 factor (HCC) Managed by Rheumatology, treated with enbrel

## 2023-06-12 NOTE — Assessment & Plan Note (Signed)
  Patient re-educated about  the importance of commitment to a  minimum of 150 minutes of exercise per week as able.  The importance of healthy food choices with portion control discussed, as well as eating regularly and within a 12 hour window most days. The need to choose "clean , green" food 50 to 75% of the time is discussed, as well as to make water the primary drink and set a goal of 64 ounces water daily.       06/01/2023    1:06 PM 05/11/2023    9:14 AM 05/11/2023    7:16 AM  Weight /BMI  Weight 212 lb 1.3 oz 214 lb 1.1 oz 214 lb 1.1 oz  Height 5\' 6"  (1.676 m) 5\' 6"  (1.676 m) 5\' 6"  (1.676 m)  BMI 34.23 kg/m2 34.55 kg/m2 34.55 kg/m2    Continue phentermine at current dose and cgange food choice

## 2023-06-12 NOTE — Assessment & Plan Note (Signed)
Hyperlipidemia:Low fat diet discussed and encouraged.   Lipid Panel  Lab Results  Component Value Date   CHOL 190 12/16/2022   HDL 57 12/16/2022   LDLCALC 117 (H) 12/16/2022   TRIG 88 12/16/2022   CHOLHDL 3.3 12/16/2022     Neds to euce fat in diet

## 2023-06-12 NOTE — Assessment & Plan Note (Signed)
Managed by Rheumatology, treated with enbrel

## 2023-06-13 ENCOUNTER — Ambulatory Visit (INDEPENDENT_AMBULATORY_CARE_PROVIDER_SITE_OTHER): Payer: BC Managed Care – PPO | Admitting: Orthopedic Surgery

## 2023-06-13 ENCOUNTER — Ambulatory Visit (HOSPITAL_COMMUNITY): Payer: BC Managed Care – PPO

## 2023-06-13 DIAGNOSIS — Z96652 Presence of left artificial knee joint: Secondary | ICD-10-CM | POA: Diagnosis not present

## 2023-06-13 DIAGNOSIS — M1710 Unilateral primary osteoarthritis, unspecified knee: Secondary | ICD-10-CM

## 2023-06-13 DIAGNOSIS — M6281 Muscle weakness (generalized): Secondary | ICD-10-CM

## 2023-06-13 DIAGNOSIS — M25662 Stiffness of left knee, not elsewhere classified: Secondary | ICD-10-CM

## 2023-06-13 NOTE — Therapy (Cosign Needed)
OUTPATIENT PHYSICAL THERAPY LOWER EXTREMITY TREATMENT   Patient Name: Julie Ryan MRN: [id number] DOB:[birth year], 57 y.o., female Today's Date: 06/13/2023  END OF SESSION:  PT End of Session - 06/13/23 1353     Visit Number 8    Number of Visits 16    Date for PT Re-Evaluation 07/06/23    Authorization Type [employer] state    Progress Note Due on Visit 10    PT Start Time 1354    PT Stop Time 1435    PT Time Calculation (min) 41 min    Activity Tolerance Patient tolerated treatment well    Behavior During Therapy [facility] for tasks assessed/performed               Past Medical History:  Diagnosis Date   Angio-edema    Anxiety    Arthritis    Phreesia 08/19/2020   Carpal tunnel syndrome of right wrist    Chronic knee pain    Depression    Endometrial polyp 08/09/2017   will get appt with Dr Despina Hidden   Fibroids 08/09/2017   Has multiple small fibroids    GERD (gastroesophageal reflux disease)    Hypertension    Neuropathy    Obesity    Prediabetes    Seizures (HCC)    1 seizure as a chold; unknown etiology and has never been on meds   Sleep apnea    Past Surgical History:  Procedure Laterality Date   APPENDECTOMY     CARPAL TUNNEL RELEASE Right 08/01/2015   Procedure: RIGHT CARPAL TUNNEL RELEASE;  Surgeon: Vickki Hearing, MD;  Location: [facility];  Service: Orthopedics;  Laterality: Right;   CERVICAL POLYPECTOMY  09/14/2017   Procedure: ENDOMETRIAL POLYPECTOMY;  Surgeon: Lazaro Arms, MD;  Location: [facility];  Service: Gynecology;;   COLONOSCOPY N/A 06/15/2017   Procedure: COLONOSCOPY;  Surgeon: Malissa Hippo, MD;  Location: [facility];  Service: Endoscopy;  Laterality: N/A;  830   ENDOMETRIAL ABLATION N/A 09/14/2017   Procedure: ENDOMETRIAL ABLATION( [place]);  Surgeon: Lazaro Arms, MD;  Location: [facility];  Service: Gynecology;  Laterality: N/A;   ESOPHAGOGASTRODUODENOSCOPY N/A 05/25/2013   Procedure: ESOPHAGOGASTRODUODENOSCOPY (EGD);  Surgeon: Malissa Hippo,  MD;  Location: [facility];  Service: Endoscopy;  Laterality: N/A;  220   GASTRIC BYPASS     HYSTEROSCOPY WITH D & C N/A 09/14/2017   Procedure: DILATATION AND CURETTAGE /HYSTEROSCOPY;  Surgeon: Lazaro Arms, MD;  Location: [facility];  Service: Gynecology;  Laterality: N/A;   TOTAL KNEE ARTHROPLASTY Left 05/11/2023   Procedure: TOTAL KNEE ARTHROPLASTY;  Surgeon: Vickki Hearing, MD;  Location: [facility];  Service: Orthopedics;  Laterality: Left;   TUBAL LIGATION     Patient Active Problem List   Diagnosis Date Noted   s/p left knee replacement 05/11/23 05/27/2023   Primary osteoarthritis of left knee 05/11/2023   Osteoarthritis of left knee 05/11/2023   COVID-19 04/04/2023   Low serum vitamin B12 12/22/2022   Shortness of breath 07/06/2022   Multifocal pneumonia 05/13/2022   Bilateral primary osteoarthritis of knee 06/02/2021   Angioedema 01/01/2021   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 factor (HCC) 08/28/2020   High risk medication use 08/28/2020   Neck pain 08/19/2020   Dermatomycosis 07/01/2020   Encounter for screening fecal occult blood testing 02/26/2020   Encounter for gynecological examination with Papanicolaou smear of cervix 02/26/2020   Knee instability, left 09/06/2019   Hiatal hernia 07/17/2019  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   Back spasm 04/12/2018   Endometrial polyp 08/09/2017   Fibroids 08/09/2017   Vaginal dryness 07/18/2017   Dyslipidemia 06/27/2017   Leg edema 03/08/2017   GAD (generalized anxiety disorder) 06/01/2016   Metabolic syndrome X 07/08/2013   Erosive esophagitis 06/25/2013   GERD (gastroesophageal reflux disease) 03/22/2013   Left knee pain 03/22/2013   Snoring 08/05/2011   Urinary incontinence 08/05/2011   Vitamin D deficiency 03/21/2011   Allergic rhinitis 09/20/2010   Back pain 03/17/2010   MORBID OBESITY 02/11/2010   Essential hypertension 02/11/2010    PCP: Kerri Perches, MD    REFERRING PROVIDER: Vickki Hearing, MD   REFERRING DIAG: [id number] (ICD-10-CM) - Primary osteoarthritis of left knee  THERAPY DIAG:  Status post total left knee replacement  Stiffness of left knee, not elsewhere classified  Muscle weakness (generalized)  Rationale for Evaluation and Treatment: Rehabilitation  ONSET DATE: 05/11/23  SUBJECTIVE:   SUBJECTIVE STATEMENT: Knee is feeling a bit better today, pain scale 5.5/10.  Able to put on compression stockings, socks, and shoes on her own. Walks with SPC home and continues with RW outdoors.  Returns to surgeon this afternoon 06/13/23.  PERTINENT HISTORY: S/pp L TKA on 05/11/23  PAIN:  Are you having pain? Yes: NPRS scale: 9-10/10 Pain location: L knee with bending  Pain description: sharp    PRECAUTIONS: Knee and Fall  RED FLAGS: None   WEIGHT BEARING RESTRICTIONS: No  FALLS:  Has patient fallen in last 6 months? 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  DIAGNOSTIC FINDINGS: N/A   PATIENT SURVEYS:  LEFS 42.5% (34/80)  COGNITION: Overall cognitive status: Within functional limits for tasks assessed     SENSATION: WFL  POSTURE: rounded shoulders and forward head  PALPATION: Unable to assess knee incision 2/2 pant style   LOWER EXTREMITY ROM:  Active ROM Right eval Left eval Right 06/01/23  Hip flexion     Hip extension     Hip abduction     Hip adduction     Hip internal rotation     Hip external rotation     Knee flexion  68 78  Knee  extension  0   Ankle dorsiflexion     Ankle plantarflexion     Ankle inversion     Ankle eversion      (Blank rows = not tested)  LOWER EXTREMITY MMT:  MMT Right eval Left eval  Hip flexion WFL   Hip extension    Hip abduction [facility]   Hip adduction    Hip internal rotation    Hip external rotation    Knee flexion WFL   Knee extension WFL   Ankle dorsiflexion WFL   Ankle plantarflexion    Ankle inversion    Ankle eversion     (Blank rows = not tested)  -->14 degree extension lag with SLR   FUNCTIONAL TESTS:  5 times sit to stand: 36.75 seconds 2 minute walk test: to be completed visit #2  GAIT: Distance walked: 30' Assistive device utilized: Single point cane Level of assistance: Complete Independence Comments: decreased stance time on L, good heel strike noted    TODAY'S TREATMENT:                                                                                                                              DATE:   06/13/23: Supine: Quad set with heel slide on foam roller x 15 SAQ on foam roller with stretch strap assist,  2 x 10 SAQ on foam roller into SLR with stretch strap assist,  2 x 10 Seated knee flexion and extension with manual resistance 2 x 15 Contract-relax for knee flexion with manual distraction between sets, 5' Weight shifts into SLS on left 5 x 5"  Mini squats to mat, 2 x 8  Lt Knee ROM: 95 flex, 3 Ext (0 PROM)   06/10/23:  Bike seat 13 half revolutions x 5' dynamic warm up with SPC 26ft (Ambulated with SPC through session). Knee drive on 40JW step 5x 20"   Supine Quad set 10x SLR with quad set prior raise- extension lag SAQ with half bolster 10x Bridge 5x 5"  Manual decongestive techniques with LE elevated  Prone:  Quad stretch with rope 3x 30"  AROM at EOS: 3-81 degrees  06/08/23 STM over quad, calf, posterior knee, and around incision.  Quad sets x 10 SAQ on foam roll 2 x 10 3" hold Heel slides AAROM with strap x  10 Seated knee flexion and extension with manual resistance  Weight shift to single leg stance x 10  Gait training with SPC 100 feet Bike seat 13 half revolutions x 5' dynamic warm up  06/06/23 Bike seat 13 half revolutions x 5' dynamic warm up Standing: Heel toe raises 2 x 10 Slant board 5 x 20" 12" step knee drives for flexion x 2\' 4"  box step ups 2 x 10 Sit to stand 2 x 5 using hands to assist   06/01/23 Bike seat 13 half revolutions x 5' dynamic warm up  Seated: heel slides x 2' Standing: Heel raises 2 x 10 6" step knee drives for flexion x 2' Mini squats x 10 4" step ups x 10 with 2 UE assist Gait training with SPC x 200 ft Seated knee flexion 78 AROM today   05/30/23 TE: Bike seat 13 partial revolutions x 5 minutes  Gait training 200' with SPC with focus on heel strike Standing knee flexion stretch on 2nd step 10 x 10 second hold Supine quad sets x 10 with 3 second hold   Supine heel slides x 5 - x 3 with overpressure    AROM/AAROM L knee flexion: 73/76  PATIENT EDUCATION:  Education details: HEP, POC, goals  Person educated: Patient Education method: Medical illustrator Education comprehension: verbalized understanding and returned demonstration  HOME EXERCISE PROGRAM: Encouraged continued performance of HEP given by HHPT after review of exercises provided Access  Code: [facility] URL: [email or web address] Date: 06/06/2023 Prepared by: [facility] - Rehab  Exercises - Supine Ankle Pumps  - 1 x daily - 7 x weekly - 3 sets - 15 reps - Supine Quadricep Sets  - 1 x daily - 7 x weekly - 3 sets - 15 reps - Supine Active Straight Leg Raise  - 1 x daily - 7 x weekly - 3 sets - 15 reps - Supine Heel Slides  - 1 x daily - 7 x weekly - 3 sets - 15 reps - Seated Long Arc Quad  - 1 x daily - 7 x weekly - 3 sets - 15 reps - Seated Knee Flexion Stretch  - 1 x daily - 7 x weekly - 3 sets - 15 reps - Sit to Stand with Armchair  - 2 x daily - 7 x weekly -  2 sets - 5 reps - Heel Raises with Counter Support  - 2 x daily - 7 x weekly - 2 sets - 10 reps - knee bends on the step using rails to hold onto  - 2 x daily - 7 x weekly - 2 sets - 10 reps  06/10/23: - Supine Short Arc Quad  - 2 x daily - 7 x weekly - 2 sets - 10 reps - 5" hold - Straight Leg Raise  - 2 x daily - 7 x weekly - 1 sets - 10 reps - Supine Bridge  - 1 x daily - 7 x weekly - 3 sets - 10 reps - Prone Quadriceps Stretch with Strap  - 2 x daily - 7 x weekly - 3 sets - 3 reps - 30" hold  ASSESSMENT:  CLINICAL IMPRESSION:   Patient presents to PT with RW wearing compression stockings. Session focused on knee mobility and knee strengthening.  SLR and SAQ demonstrated visible muscle fatigue and slight extension lag present due to quad weakness. Added a strap assist to maintaining full extension during SLR and SAQ. Patient demonstrated improved muscle recruitment during manual resistance with knee extension and flexion. Distraction provided some joint pain relief. ROM measured in supine at 0 degrees EXT PROM with heel prop; 3 degrees EXT AROM. 95 degrees of AAROM knee flexion. Patient is a bit worried to transfer from her walker to [facility] in the community due to worry that her knee might buckle. Patient encouraged work on terminal knee extension and standing HEP exercises more at home. Patient will continue to benefit from PT services to progress toward goals, improve ROM, strength, and promote optimal return to PLOF.   OBJECTIVE IMPAIRMENTS: Abnormal gait, decreased activity tolerance, decreased endurance, decreased mobility, difficulty walking, decreased ROM, decreased strength, hypomobility, impaired flexibility, and pain.   ACTIVITY LIMITATIONS: squatting, sleeping, stairs, and locomotion level  PARTICIPATION LIMITATIONS: cleaning, laundry, driving, community activity, and yard work  PERSONAL FACTORS: Age, Past/current experiences, and Time since onset of injury/illness/exacerbation are  also affecting patient's functional outcome.   REHAB POTENTIAL: Good  CLINICAL DECISION MAKING: Stable/uncomplicated  EVALUATION COMPLEXITY: Low   GOALS: Goals reviewed with patient? Yes  SHORT TERM GOALS: Target date: 06/14/2023  Patient will be independent in HEP to improve strength/mobility for better functional independence with ADLs. Baseline: Goal status: In progress  LONG TERM GOALS: Target date: 07/05/2023   Patient will increase lower extremity functional scale to >60/80 to demonstrate improved functional mobility and increased tolerance with ADLs.  Baseline: 11/20: 42.5% Goal status: In progress  2.  Patient will increase BLE gross strength to 4+/5 as  to improve functional strength for independent gait, increased standing tolerance and increased ADL ability. Baseline:  Goal status: in progress  3.  Patient will demonstrates SLR with 0 degree extension lag to demonstrate improved quad strength.  Baseline: 11/20: 14 degree extension lag  Goal status: In progress  4.   Patient (< 35 years old) will complete five times sit to stand test in < 10 seconds indicating an increased LE strength and improved balance. Baseline: 11/20: 36.75 seconds Goal status: in progress   5.  Patient will demonstrate L knee AROM symmetrical to R knee for improve gait ability and overall mobility.  Baseline: 11/20: see above  Goal status: In progress   PLAN:  PT FREQUENCY:  1-3x/week  PT DURATION: 6 weeks  PLANNED INTERVENTIONS: 97164- PT Re-evaluation, 97110-Therapeutic exercises, 97530- Therapeutic activity, 97112- Neuromuscular re-education, 97535- Self Care, 16109- Manual therapy, 97116- Gait training, 97014- Electrical stimulation (unattended), [phone]- Electrical stimulation (manual), Patient/Family education, Stair training, Dry Needling, Joint mobilization, Joint manipulation, Scar mobilization, DME instructions, Cryotherapy, and Moist heat  PLAN FOR NEXT SESSION:  end range  quad strengthening, knee flexion ROM, discharge RW and encourage SPC use in community.    Chas Makena Murdock, Student-PT 06/13/2023, 1:57 PM   " I agree with the following treatment note after reviewing documentation. This session was performed under the supervision of a licensed clinician."  9:27 AM, 06/14/23 Amy Small Lynch MPT [place] [facility] [place] [phone]

## 2023-06-13 NOTE — Progress Notes (Signed)
Chief Complaint  Patient presents with   Routine Post Op    FU TKA left knee  pain is normal pain and doing better patient is in therapy and would like to discuss the right knee    Encounter Diagnoses  Name Primary?   s/p left knee replacement 05/11/23 Yes   Primary localized osteoarthritis of knee    Week 5 status post total knee doing well using the cane flexion is 95 to 100 degrees full extension  Follow-up in 6 weeks continue PT

## 2023-06-15 ENCOUNTER — Encounter (HOSPITAL_COMMUNITY): Payer: BC Managed Care – PPO

## 2023-06-16 ENCOUNTER — Encounter: Payer: Self-pay | Admitting: Family Medicine

## 2023-06-17 ENCOUNTER — Ambulatory Visit (HOSPITAL_COMMUNITY): Payer: BC Managed Care – PPO

## 2023-06-17 ENCOUNTER — Other Ambulatory Visit: Payer: Self-pay | Admitting: Family Medicine

## 2023-06-17 DIAGNOSIS — Z96652 Presence of left artificial knee joint: Secondary | ICD-10-CM

## 2023-06-17 DIAGNOSIS — M6281 Muscle weakness (generalized): Secondary | ICD-10-CM

## 2023-06-17 DIAGNOSIS — M25662 Stiffness of left knee, not elsewhere classified: Secondary | ICD-10-CM

## 2023-06-17 MED ORDER — PHENTERMINE HCL 37.5 MG PO TABS: 37.5000 mg | ORAL_TABLET | Freq: Every day | ORAL | 1 refills | Status: DC

## 2023-06-17 NOTE — Therapy (Addendum)
OUTPATIENT PHYSICAL THERAPY LOWER EXTREMITY TREATMENT   Patient Name: Julie Ryan MRN: [id number] DOB:03/22/1966, 57 y.o., female Today's Date: 06/17/2023  END OF SESSION:  PT End of Session - 06/17/23 1349     Visit Number 9    Number of Visits 16    Date for PT Re-Evaluation 07/06/23    Authorization Type [employer] state    Progress Note Due on Visit 10    PT Start Time 1349    PT Stop Time 1430    PT Time Calculation (min) 41 min    Activity Tolerance Patient tolerated treatment well    Behavior During Therapy [facility] for tasks assessed/performed               Past Medical History:  Diagnosis Date   Angio-edema    Anxiety    Arthritis    Phreesia 08/19/2020   Carpal tunnel syndrome of right wrist    Chronic knee pain    Depression    Endometrial polyp 08/09/2017   will get appt with Dr Despina Hidden   Fibroids 08/09/2017   Has multiple small fibroids    GERD (gastroesophageal reflux disease)    Hypertension    Neuropathy    Obesity    Prediabetes    Seizures (HCC)    1 seizure as a chold; unknown etiology and has never been on meds   Sleep apnea    Past Surgical History:  Procedure Laterality Date   APPENDECTOMY     CARPAL TUNNEL RELEASE Right 08/01/2015   Procedure: RIGHT CARPAL TUNNEL RELEASE;  Surgeon: Vickki Hearing, MD;  Location: [facility];  Service: Orthopedics;  Laterality: Right;   CERVICAL POLYPECTOMY  09/14/2017   Procedure: ENDOMETRIAL POLYPECTOMY;  Surgeon: Lazaro Arms, MD;  Location: [facility];  Service: Gynecology;;   COLONOSCOPY N/A 06/15/2017   Procedure: COLONOSCOPY;  Surgeon: Malissa Hippo, MD;  Location: [facility];  Service: Endoscopy;  Laterality: N/A;  830   ENDOMETRIAL ABLATION N/A 09/14/2017   Procedure: ENDOMETRIAL ABLATION( [place]);  Surgeon: Lazaro Arms, MD;  Location: [facility];  Service: Gynecology;  Laterality: N/A;   ESOPHAGOGASTRODUODENOSCOPY N/A 05/25/2013   Procedure: ESOPHAGOGASTRODUODENOSCOPY (EGD);  Surgeon: Malissa Hippo, MD;  Location[facility];  Service: Endoscopy;  Laterality: N/A;  220   GASTRIC BYPASS     HYSTEROSCOPY WITH D & C N/A 09/14/2017   Procedure: DILATATION AND CURETTAGE /HYSTEROSCOPY;  Surgeon: Lazaro Arms, MD;  Location: [facility];  Service: Gynecology;  Laterality: N/A;   TOTAL KNEE ARTHROPLASTY Left 05/11/2023   Procedure: TOTAL KNEE ARTHROPLASTY;  Surgeon: Vickki Hearing, MD;  Location: [facility];  Service: Orthopedics;  Laterality: Left;   TUBAL LIGATION     Patient Active Problem List   Diagnosis Date Noted   s/p left knee replacement 05/11/23 05/27/2023   Primary osteoarthritis of left knee 05/11/2023   Osteoarthritis of left knee 05/11/2023   COVID-19 04/04/2023   Low serum vitamin B12 12/22/2022   Shortness of breath 07/06/2022   Multifocal pneumonia 05/13/2022   Bilateral primary osteoarthritis of knee 06/02/2021   Angioedema 01/01/2021   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 factor (HCC) 08/28/2020   High risk medication use 08/28/2020   Neck pain 08/19/2020   Dermatomycosis 07/01/2020   Encounter for screening fecal occult blood testing 02/26/2020   Encounter for gynecological examination with Papanicolaou smear of cervix 02/26/2020   Knee instability, left 09/06/2019   Hiatal hernia 07/17/2019  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   Back spasm 04/12/2018   Endometrial polyp 08/09/2017   Fibroids 08/09/2017   Vaginal dryness 07/18/2017   Dyslipidemia 06/27/2017   Leg edema 03/08/2017   GAD (generalized anxiety disorder) 06/01/2016   Metabolic syndrome X 07/08/2013   Erosive esophagitis 06/25/2013   GERD (gastroesophageal reflux disease) 03/22/2013   Left knee pain 03/22/2013   Snoring 08/05/2011   Urinary incontinence 08/05/2011   Vitamin D deficiency 03/21/2011   Allergic rhinitis 09/20/2010   Back pain 03/17/2010   MORBID OBESITY 02/11/2010   Essential hypertension 02/11/2010    PCP: Kerri Perches,  MD   REFERRING PROVIDER: Vickki Hearing, MD   REFERRING DIAG: [id number] (ICD-10-CM) - Primary osteoarthritis of left knee  THERAPY DIAG:  Stiffness of left knee, not elsewhere classified  Status post total left knee replacement  Muscle weakness (generalized)  Rationale for Evaluation and Treatment: Rehabilitation  ONSET DATE: 05/11/23  SUBJECTIVE:   SUBJECTIVE STATEMENT: Presents to the clinic with [facility]. Walking around house without AD. Knee is feeling pretty good today, pain scale 4-5/10. Follow up appointment with Dr. Romeo Apple went well. Next follow up January 20th.   PERTINENT HISTORY: S/p L TKA on 05/11/23  PAIN:  Are you having pain? Yes: NPRS scale: 9-10/10 Pain location: L knee with bending  Pain description: sharp    PRECAUTIONS: Knee and Fall  RED FLAGS: None   WEIGHT BEARING RESTRICTIONS: No  FALLS:  Has patient fallen in last 6 months? 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  DIAGNOSTIC FINDINGS: N/A   PATIENT SURVEYS:  LEFS 42.5% (34/80)  COGNITION: Overall cognitive status: Within functional limits for tasks assessed     SENSATION: WFL  POSTURE: rounded shoulders and forward head  PALPATION: Unable to assess knee incision 2/2 pant style   LOWER EXTREMITY ROM:  Active ROM Right eval Left eval Left 06/01/23  Hip flexion     Hip extension     Hip abduction     Hip adduction     Hip internal rotation     Hip external rotation     Knee flexion  68 78  Knee extension  0    Ankle dorsiflexion     Ankle plantarflexion     Ankle inversion     Ankle eversion      (Blank rows = not tested)  LOWER EXTREMITY MMT:  MMT Right eval Left eval  Hip flexion WFL   Hip extension    Hip abduction [facility]   Hip adduction    Hip internal rotation    Hip external rotation    Knee flexion WFL   Knee extension WFL   Ankle dorsiflexion WFL   Ankle plantarflexion    Ankle inversion    Ankle eversion     (Blank rows = not tested)  -->14 degree extension lag with SLR at eval  FUNCTIONAL TESTS:  5 times sit to stand: 36.75 seconds 2 minute walk test: to be completed visit #2   GAIT: Distance walked:  30' Assistive device utilized: Single point cane Level of assistance: Complete Independence Comments: decreased stance time on L, good heel strike noted    TODAY'S TREATMENT:                                                                                                                              DATE:   06/17/23: Nustep, seat 9, 6' SAQ on foam roller with stretch strap assist,  2 x 10, 3" pause SAQ on foam roller into SLR with stretch strap assist, 2 x 10 Knee to chest on Physio ball with stretch strap assist, 3' Hamstring curl on Physio ball, 2 x 15 LAQ with 2# AW, 2x 15, 3 sec eccentric  Weight shifts into SLS on left 10 x 3"  Weight shift anterior-posterior (knee over toes)  Mini squats to mat, 2 x 8   06/13/23: Supine: Quad set with heel slide on foam roller x 15 SAQ on foam roller with stretch strap assist,  2 x 10 SAQ on foam roller into SLR with stretch strap assist,  2 x 10 Seated knee flexion and extension with manual resistance 2 x 15 Contract-relax for knee flexion with manual distraction between sets, 5' Weight shifts into SLS on left 5 x 5"  Mini squats to mat, 2 x 8  Lt Knee ROM: 95 flex, 3 Ext (0 PROM)   06/10/23:  Bike seat 13 half revolutions x 5' dynamic warm up with SPC 276ft (Ambulated with SPC through session). Knee drive  on 78IO step 5x 20"   Supine Quad set 10x SLR with quad set prior raise- extension lag SAQ with half bolster 10x Bridge 5x 5"  Manual decongestive techniques with LE elevated  Prone:  Quad stretch with rope 3x 30"  AROM at EOS: 3-81 degrees  06/08/23 STM over quad, calf, posterior knee, and around incision.  Quad sets x 10 SAQ on foam roll 2 x 10 3" hold Heel slides AAROM with strap x 10 Seated knee flexion and extension with manual resistance  Weight shift to single leg stance x 10  Gait training with SPC 100 feet Bike seat 13 half revolutions x 5' dynamic warm up  06/06/23 Bike seat 13 half revolutions x 5' dynamic warm up Standing: Heel toe raises 2 x 10 Slant board 5 x 20" 12" step knee drives for flexion x 2\' 4"  box step ups 2 x 10 Sit to stand 2 x 5 using hands to assist   06/01/23 Bike seat 13 half revolutions x 5' dynamic warm up  Seated: heel slides x 2' Standing: Heel raises 2 x 10 6" step knee drives for flexion x 2' Mini squats x 10 4" step ups x 10 with 2 UE assist Gait training with SPC x 200 ft Seated knee flexion 78 AROM today   05/30/23 TE: Bike seat 13 partial revolutions x 5 minutes  Gait training 200' with SPC with focus on heel strike  Standing knee flexion stretch on 2nd step 10 x 10 second hold Supine quad sets x 10 with 3 second hold   Supine heel slides x 5 - x 3 with overpressure    AROM/AAROM L knee flexion: 73/76  PATIENT EDUCATION:  Education details: HEP, POC, goals  Person educated: Patient Education method: Medical illustrator Education comprehension: verbalized understanding and returned demonstration  HOME EXERCISE PROGRAM: Encouraged continued performance of HEP given by HHPT after review of exercises provided Access Code: [facility] URL: [email or web address] Date: 06/06/2023 Prepared by: [facility] - [facility]  Exercises - Supine Ankle Pumps  - 1 x daily - 7 x weekly - 3 sets - 15 reps - Supine  Quadricep Sets  - 1 x daily - 7 x weekly - 3 sets - 15 reps - Supine Active Straight Leg Raise  - 1 x daily - 7 x weekly - 3 sets - 15 reps - Supine Heel Slides  - 1 x daily - 7 x weekly - 3 sets - 15 reps - Seated Long Arc Quad  - 1 x daily - 7 x weekly - 3 sets - 15 reps - Seated Knee Flexion Stretch  - 1 x daily - 7 x weekly - 3 sets - 15 reps - Sit to Stand with Armchair  - 2 x daily - 7 x weekly - 2 sets - 5 reps - Heel Raises with Counter Support  - 2 x daily - 7 x weekly - 2 sets - 10 reps - knee bends on the step using rails to hold onto  - 2 x daily - 7 x weekly - 2 sets - 10 reps  06/10/23: - Supine Short Arc Quad  - 2 x daily - 7 x weekly - 2 sets - 10 reps - 5" hold - Straight Leg Raise  - 2 x daily - 7 x weekly - 1 sets - 10 reps - Supine Bridge  - 1 x daily - 7 x weekly - 3 sets - 10 reps - Prone Quadriceps Stretch with Strap  - 2 x daily - 7 x weekly - 3 sets - 3 reps - 30" hold  ASSESSMENT:  CLINICAL IMPRESSION:   Patient presents to PT with [facility] after a good report from the surgeon after last session. Today's session focused on knee mobility and end range strengthening.  SLR and SAQ demonstrated visible muscle fatigue, but better control without much extension lag. Introduced a hamstring curl on a physio ball. Patient was able to tolerate treatment well with appropriate levels of fatigue. Patient demonstrates improved control with standing balance and weight shifts. Patient encouraged work on terminal knee extension and standing HEP exercises more at home. Patient will continue to benefit from PT services to progress toward goals, improve ROM, strength, and promote optimal return to PLOF.    OBJECTIVE IMPAIRMENTS: Abnormal gait, decreased activity tolerance, decreased endurance, decreased mobility, difficulty walking, decreased ROM, decreased strength, hypomobility, impaired flexibility, and pain.   ACTIVITY LIMITATIONS: squatting, sleeping, stairs, and locomotion  level  PARTICIPATION LIMITATIONS: cleaning, laundry, driving, community activity, and yard work  PERSONAL FACTORS: Age, Past/current experiences, and Time since onset of injury/illness/exacerbation are also affecting patient's functional outcome.   REHAB POTENTIAL: Good  CLINICAL DECISION MAKING: Stable/uncomplicated  EVALUATION COMPLEXITY: Low   GOALS: Goals reviewed with patient? Yes  SHORT TERM GOALS: Target date: 06/14/2023  Patient will be independent in HEP to improve strength/mobility for better functional independence with ADLs. Baseline: Goal status: In progress  LONG  TERM GOALS: Target date: 07/05/2023   Patient will increase lower extremity functional scale to >60/80 to demonstrate improved functional mobility and increased tolerance with ADLs.  Baseline: 11/20: 42.5% Goal status: In progress  2.  Patient will increase BLE gross strength to 4+/5 as to improve functional strength for independent gait, increased standing tolerance and increased ADL ability. Baseline:  Goal status: in progress  3.  Patient will demonstrates SLR with 0 degree extension lag to demonstrate improved quad strength.  Baseline: 11/20: 14 degree extension lag  Goal status: In progress  4.   Patient (< 57 years old) will complete five times sit to stand test in < 10 seconds indicating an increased LE strength and improved balance. Baseline: 11/20: 36.75 seconds Goal status: in progress   5.  Patient will demonstrate L knee AROM symmetrical to R knee for improve gait ability and overall mobility.  Baseline: 11/20: see above  Goal status: In progress   PLAN:  PT FREQUENCY:  1-3x/week  PT DURATION: 6 weeks  PLANNED INTERVENTIONS: 97164- PT Re-evaluation, 97110-Therapeutic exercises, 97530- Therapeutic activity, 97112- Neuromuscular re-education, 97535- Self Care, 78295- Manual therapy, 97116- Gait training, 97014- Electrical stimulation (unattended), [phone]- Electrical stimulation  (manual), Patient/Family education, Stair training, Dry Needling, Joint mobilization, Joint manipulation, Scar mobilization, DME instructions, Cryotherapy, and Moist heat  PLAN FOR NEXT SESSION:  End range quad strengthening, knee flexion ROM, balancing and standing activities   Chas Brelan Hannen, Student-PT 06/17/2023, 1:50 PM   " I agree with the following treatment note after reviewing documentation. This session was performed under the supervision of a licensed clinician."  1:50 PM, 06/17/23 Amy Small Lynch MPT [place] [place] [phone]

## 2023-06-20 ENCOUNTER — Encounter (HOSPITAL_COMMUNITY): Payer: BC Managed Care – PPO

## 2023-06-21 ENCOUNTER — Ambulatory Visit: Payer: BC Managed Care – PPO | Admitting: Internal Medicine

## 2023-06-21 ENCOUNTER — Encounter: Payer: Self-pay | Admitting: Internal Medicine

## 2023-06-21 ENCOUNTER — Other Ambulatory Visit: Payer: Self-pay | Admitting: Family Medicine

## 2023-06-21 VITALS — BP 135/76 | HR 88 | Resp 14 | Ht 66.0 in | Wt 213.0 lb

## 2023-06-21 DIAGNOSIS — Z79899 Other long term (current) drug therapy: Secondary | ICD-10-CM | POA: Diagnosis not present

## 2023-06-21 DIAGNOSIS — M170 Bilateral primary osteoarthritis of knee: Secondary | ICD-10-CM | POA: Diagnosis not present

## 2023-06-21 DIAGNOSIS — M05741 Rheumatoid arthritis with rheumatoid factor of right hand without organ or systems involvement: Secondary | ICD-10-CM

## 2023-06-21 DIAGNOSIS — M05742 Rheumatoid arthritis with rheumatoid factor of left hand without organ or systems involvement: Secondary | ICD-10-CM | POA: Diagnosis not present

## 2023-06-22 ENCOUNTER — Ambulatory Visit (HOSPITAL_COMMUNITY): Payer: BC Managed Care – PPO

## 2023-06-22 DIAGNOSIS — Z96652 Presence of left artificial knee joint: Secondary | ICD-10-CM | POA: Diagnosis not present

## 2023-06-22 DIAGNOSIS — M25662 Stiffness of left knee, not elsewhere classified: Secondary | ICD-10-CM

## 2023-06-22 DIAGNOSIS — M6281 Muscle weakness (generalized): Secondary | ICD-10-CM

## 2023-06-22 LAB — SEDIMENTATION RATE: Sed Rate: 39 mm/h — ABNORMAL HIGH (ref 0–30)

## 2023-06-22 LAB — CBC WITH DIFFERENTIAL/PLATELET
Absolute Lymphocytes: 2827 {cells}/uL (ref 850–3900)
Absolute Monocytes: 454 {cells}/uL (ref 200–950)
Basophils Absolute: 32 {cells}/uL (ref 0–200)
Basophils Relative: 0.4 %
Eosinophils Absolute: 138 {cells}/uL (ref 15–500)
Eosinophils Relative: 1.7 %
HCT: 40.4 % (ref 35.0–45.0)
Hemoglobin: 12.9 g/dL (ref 11.7–15.5)
MCH: 30.6 pg (ref 27.0–33.0)
MCHC: 31.9 g/dL — ABNORMAL LOW (ref 32.0–36.0)
MCV: 95.7 fL (ref 80.0–100.0)
MPV: 10.3 fL (ref 7.5–12.5)
Monocytes Relative: 5.6 %
Neutro Abs: 4649 {cells}/uL (ref 1500–7800)
Neutrophils Relative %: 57.4 %
Platelets: 354 10*3/uL (ref 140–400)
RBC: 4.22 10*6/uL (ref 3.80–5.10)
RDW: 12.3 % (ref 11.0–15.0)
Total Lymphocyte: 34.9 %
WBC: 8.1 10*3/uL (ref 3.8–10.8)

## 2023-06-22 LAB — COMPLETE METABOLIC PANEL WITH GFR
AG Ratio: 1.1 (calc) (ref 1.0–2.5)
ALT: 14 U/L (ref 6–29)
AST: 13 U/L (ref 10–35)
Albumin: 3.8 g/dL (ref 3.6–5.1)
Alkaline phosphatase (APISO): 108 U/L (ref 37–153)
BUN: 11 mg/dL (ref 7–25)
CO2: 30 mmol/L (ref 20–32)
Calcium: 9.9 mg/dL (ref 8.6–10.4)
Chloride: 104 mmol/L (ref 98–110)
Creat: 0.95 mg/dL (ref 0.50–1.03)
Globulin: 3.4 g/dL (ref 1.9–3.7)
Glucose, Bld: 102 mg/dL — ABNORMAL HIGH (ref 65–99)
Potassium: 4.1 mmol/L (ref 3.5–5.3)
Sodium: 141 mmol/L (ref 135–146)
Total Bilirubin: 0.6 mg/dL (ref 0.2–1.2)
Total Protein: 7.2 g/dL (ref 6.1–8.1)
eGFR: 69 mL/min/{1.73_m2} (ref >=60)

## 2023-06-22 NOTE — Therapy (Signed)
OUTPATIENT PHYSICAL THERAPY LOWER EXTREMITY TREATMENT   Patient Name: Julie Ryan MRN: [id number] DOB:Jul 01, 1966, 57 y.o., female Today's Date: 06/22/2023  END OF SESSION:  PT End of Session - 06/22/23 1304     Visit Number 10    Number of Visits 16    Date for PT Re-Evaluation 07/06/23    Authorization Type [employer] state    Progress Note Due on Visit 10    PT Start Time 1304    PT Stop Time 1344    PT Time Calculation (min) 40 min    Activity Tolerance Patient tolerated treatment well    Behavior During Therapy [facility] for tasks assessed/performed               Past Medical History:  Diagnosis Date   Angio-edema    Anxiety    Arthritis    Phreesia 08/19/2020   Carpal tunnel syndrome of right wrist    Chronic knee pain    Depression    Endometrial polyp 08/09/2017   will get appt with Dr Despina Hidden   Fibroids 08/09/2017   Has multiple small fibroids    GERD (gastroesophageal reflux disease)    Hypertension    Neuropathy    Obesity    Prediabetes    Seizures (HCC)    1 seizure as a chold; unknown etiology and has never been on meds   Sleep apnea    Past Surgical History:  Procedure Laterality Date   APPENDECTOMY     CARPAL TUNNEL RELEASE Right 08/01/2015   Procedure: RIGHT CARPAL TUNNEL RELEASE;  Surgeon: Vickki Hearing, MD;  Location: [facility];  Service: Orthopedics;  Laterality: Right;   CERVICAL POLYPECTOMY  09/14/2017   Procedure: ENDOMETRIAL POLYPECTOMY;  Surgeon: Lazaro Arms, MD;  Location: [facility];  Service: Gynecology;;   COLONOSCOPY N/A 06/15/2017   Procedure: COLONOSCOPY;  Surgeon: Malissa Hippo, MD;  Location: [facility];  Service: Endoscopy;  Laterality: N/A;  830   ENDOMETRIAL ABLATION N/A 09/14/2017   Procedure: ENDOMETRIAL ABLATION( [place]);  Surgeon: Lazaro Arms, MD;  Location: [facility];  Service: Gynecology;  Laterality: N/A;   ESOPHAGOGASTRODUODENOSCOPY N/A 05/25/2013   Procedure: ESOPHAGOGASTRODUODENOSCOPY (EGD);  Surgeon: Malissa Hippo, MD;  Location: [facility];  Service: Endoscopy;  Laterality: N/A;  220   GASTRIC BYPASS     HYSTEROSCOPY WITH D & C N/A 09/14/2017   Procedure: DILATATION AND CURETTAGE /HYSTEROSCOPY;  Surgeon: Lazaro Arms, MD;  Location: [facility];  Service: Gynecology;  Laterality: N/A;   TOTAL KNEE ARTHROPLASTY Left 05/11/2023   Procedure: TOTAL KNEE ARTHROPLASTY;  Surgeon: Vickki Hearing, MD;  Location: [facility];  Service: Orthopedics;  Laterality: Left;   TUBAL LIGATION     Patient Active Problem List   Diagnosis Date Noted   s/p left knee replacement 05/11/23 05/27/2023   Primary osteoarthritis of left knee 05/11/2023   Osteoarthritis of left knee 05/11/2023   COVID-19 04/04/2023   Low serum vitamin B12 12/22/2022   Shortness of breath 07/06/2022   Multifocal pneumonia 05/13/2022   Bilateral primary osteoarthritis of knee 06/02/2021   Angioedema 01/01/2021   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 factor (HCC) 08/28/2020   High risk medication use 08/28/2020   Neck pain 08/19/2020   Dermatomycosis 07/01/2020   Encounter for screening fecal occult blood testing 02/26/2020   Encounter for gynecological examination with Papanicolaou smear of cervix 02/26/2020   Knee instability, left 09/06/2019   Hiatal hernia 07/17/2019  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   Back spasm 04/12/2018   Endometrial polyp 08/09/2017   Fibroids 08/09/2017   Vaginal dryness 07/18/2017   Dyslipidemia 06/27/2017   Leg edema 03/08/2017   GAD (generalized anxiety disorder) 06/01/2016   Metabolic syndrome X 07/08/2013   Erosive esophagitis 06/25/2013   GERD (gastroesophageal reflux disease) 03/22/2013   Left knee pain 03/22/2013   Snoring 08/05/2011   Urinary incontinence 08/05/2011   Vitamin D deficiency 03/21/2011   Allergic rhinitis 09/20/2010   Back pain 03/17/2010   MORBID OBESITY 02/11/2010   Essential hypertension 02/11/2010    PCP: Kerri Perches,  MD   REFERRING PROVIDER: Vickki Hearing, MD   REFERRING DIAG: [phone] (ICD-10-CM) - Primary osteoarthritis of left knee  THERAPY DIAG:  Stiffness of left knee, not elsewhere classified  Status post total left knee replacement  Muscle weakness (generalized)  Rationale for Evaluation and Treatment: Rehabilitation  ONSET DATE: 05/11/23  SUBJECTIVE:   SUBJECTIVE STATEMENT: 5/10 pain left knee today.  [facility]  PERTINENT HISTORY: S/p L TKA on 05/11/23  PAIN:  Are you having pain? Yes: NPRS scale: 9-10/10 Pain location: L knee with bending  Pain description: sharp    PRECAUTIONS: Knee and Fall  RED FLAGS: None   WEIGHT BEARING RESTRICTIONS: No  FALLS:  Has patient fallen in last 6 months? 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  DIAGNOSTIC FINDINGS: N/A   PATIENT SURVEYS:  LEFS 42.5% (34/80)  COGNITION: Overall cognitive status: Within functional limits for tasks assessed     SENSATION: WFL  POSTURE: rounded shoulders and forward head  PALPATION: Unable to assess knee incision 2/2 pant style   LOWER EXTREMITY ROM:  Active ROM Right eval Left eval Left 06/01/23 Left 06/22/23  Hip flexion      Hip extension      Hip abduction      Hip adduction      Hip internal rotation      Hip external rotation      Knee flexion  68 78 100  Knee extension  0  0  Ankle dorsiflexion      Ankle plantarflexion      Ankle inversion      Ankle eversion       (Blank rows = not  tested)  LOWER EXTREMITY MMT:  MMT Right eval Left eval  Hip flexion WFL   Hip extension    Hip abduction WFL   Hip adduction    Hip internal rotation    Hip external rotation    Knee flexion WFL   Knee extension WFL   Ankle dorsiflexion WFL   Ankle plantarflexion    Ankle inversion    Ankle eversion     (Blank rows = not tested)  -->14 degree extension lag with SLR at eval  FUNCTIONAL TESTS:  5 times sit to stand: 36.75 seconds 2 minute walk test: to be completed visit #2   GAIT: Distance walked: 30' Assistive device utilized: Single point cane Level of assistance: Complete  Independence Comments: decreased stance time on L, good heel strike noted    TODAY'S TREATMENT:                                                                                                                              DATE:  06/22/23 Bike seat 13 x 5' dynamic warm up/ROM Standing: Heel/toe raises x 20 Knee drives for flexion x 2' Mini squats to chair for target 2 x 10 4" step ups with 1 UE assist 2 x 10 4" lateral step ups with 2 UE assist 2 x 10 Tandem stance x 10" each Supine: AROM: left knee 0 to 100 SLR 2 x 5       06/17/23: Nustep, seat 9, 6' SAQ on foam roller with stretch strap assist,  2 x 10, 3" pause SAQ on foam roller into SLR with stretch strap assist, 2 x 10 Knee to chest on Physio ball with stretch strap assist, 3' Hamstring curl on Physio ball, 2 x 15 LAQ with 2# AW, 2x 15, 3 sec eccentric  Weight shifts into SLS on left 10 x 3"  Weight shift anterior-posterior (knee over toes)  Mini squats to mat, 2 x 8   06/13/23: Supine: Quad set with heel slide on foam roller x 15 SAQ on foam roller with stretch strap assist,  2 x 10 SAQ on foam roller into SLR with stretch strap assist,  2 x 10 Seated knee flexion and extension with manual resistance 2 x 15 Contract-relax for knee flexion with manual distraction between sets, 5' Weight shifts into SLS on left 5 x 5"   Mini squats to mat, 2 x 8  Lt Knee ROM: 95 flex, 3 Ext (0 PROM)   06/10/23:  Bike seat 13 half revolutions x 5' dynamic warm up with SPC 257ft (Ambulated with SPC through session). Knee drive on 16XW step 5x 20"   Supine Quad set 10x SLR with quad set prior raise- extension lag SAQ with half bolster 10x Bridge 5x 5"  Manual decongestive techniques with LE elevated  Prone:  Quad stretch with rope 3x 30"  AROM at EOS: 3-81 degrees  06/08/23 STM over quad, calf, posterior knee, and around incision.  Quad sets x 10 SAQ on foam roll 2 x 10 3" hold Heel slides AAROM with strap x 10 Seated knee flexion and extension with manual resistance  Weight shift to single leg stance x 10  Gait training with SPC 100 feet Bike seat 13 half revolutions x 5' dynamic warm up     PATIENT EDUCATION:  Education details: HEP, POC, goals  Person educated: Patient Education method: Medical illustrator Education comprehension: verbalized understanding and returned demonstration  HOME EXERCISE PROGRAM: Encouraged continued performance of HEP given by HHPT after review of exercises provided Access Code: [facility] URL: [email or web address] Date: 06/06/2023 Prepared by: [facility]  Exercises - Supine Ankle Pumps  - 1 x daily - 7 x  weekly - 3 sets - 15 reps - Supine Quadricep Sets  - 1 x daily - 7 x weekly - 3 sets - 15 reps - Supine Active Straight Leg Raise  - 1 x daily - 7 x weekly - 3 sets - 15 reps - Supine Heel Slides  - 1 x daily - 7 x weekly - 3 sets - 15 reps - Seated Long Arc Quad  - 1 x daily - 7 x weekly - 3 sets - 15 reps - Seated Knee Flexion Stretch  - 1 x daily - 7 x weekly - 3 sets - 15 reps - Sit to Stand with Armchair  - 2 x daily - 7 x weekly - 2 sets - 5 reps - Heel Raises with Counter Support  - 2 x daily - 7 x weekly - 2 sets - 10 reps - knee bends on the step using rails to hold onto  - 2 x daily - 7 x weekly - 2 sets - 10  reps  06/10/23: - Supine Short Arc Quad  - 2 x daily - 7 x weekly - 2 sets - 10 reps - 5" hold - Straight Leg Raise  - 2 x daily - 7 x weekly - 1 sets - 10 reps - Supine Bridge  - 1 x daily - 7 x weekly - 3 sets - 10 reps - Prone Quadriceps Stretch with Strap  - 2 x daily - 7 x weekly - 3 sets - 3 reps - 30" hold  ASSESSMENT:  CLINICAL IMPRESSION:   Started with a warm up on the bike and patient able to make full forward revolutions today. Good progress with knee flexion ROM and left knee (surgical) extension is greater than right knee (non surgical ); still has a slight lag with SLR. Added tandem stance for balance.  Updated HEP.    Patient will continue to benefit from PT services to progress toward goals, improve ROM, strength, and promote optimal return to PLOF.    OBJECTIVE IMPAIRMENTS: Abnormal gait, decreased activity tolerance, decreased endurance, decreased mobility, difficulty walking, decreased ROM, decreased strength, hypomobility, impaired flexibility, and pain.   ACTIVITY LIMITATIONS: squatting, sleeping, stairs, and locomotion level  PARTICIPATION LIMITATIONS: cleaning, laundry, driving, community activity, and yard work  PERSONAL FACTORS: Age, Past/current experiences, and Time since onset of injury/illness/exacerbation are also affecting patient's functional outcome.   REHAB POTENTIAL: Good  CLINICAL DECISION MAKING: Stable/uncomplicated  EVALUATION COMPLEXITY: Low   GOALS: Goals reviewed with patient? Yes  SHORT TERM GOALS: Target date: 06/14/2023  Patient will be independent in HEP to improve strength/mobility for better functional independence with ADLs. Baseline: Goal status: In progress  LONG TERM GOALS: Target date: 07/05/2023   Patient will increase lower extremity functional scale to >60/80 to demonstrate improved functional mobility and increased tolerance with ADLs.  Baseline: 11/20: 42.5% Goal status: In progress  2.  Patient will increase BLE  gross strength to 4+/5 as to improve functional strength for independent gait, increased standing tolerance and increased ADL ability. Baseline:  Goal status: in progress  3.  Patient will demonstrates SLR with 0 degree extension lag to demonstrate improved quad strength.  Baseline: 11/20: 14 degree extension lag  Goal status: In progress  4.   Patient (< 31 years old) will complete five times sit to stand test in < 10 seconds indicating an increased LE strength and improved balance. Baseline: 11/20: 36.75 seconds Goal status: in progress   5.  Patient will demonstrate L  knee AROM symmetrical to R knee for improve gait ability and overall mobility.  Baseline: 11/20: see above  Goal status: In progress   PLAN:  PT FREQUENCY:  1-3x/week  PT DURATION: 6 weeks  PLANNED INTERVENTIONS: 97164- PT Re-evaluation, 97110-Therapeutic exercises, 97530- Therapeutic activity, 97112- Neuromuscular re-education, 97535- Self Care, 21308- Manual therapy, [phone]- Gait training, 97014- Electrical stimulation (unattended), [phone]- Electrical stimulation (manual), Patient/Family education, Stair training, Dry Needling, Joint mobilization, Joint manipulation, Scar mobilization, DME instructions, Cryotherapy, and Moist heat  PLAN FOR NEXT SESSION:  End range quad strengthening, knee flexion ROM, balancing and standing activities   1:43 PM, 06/22/23 Theotis Gerdeman Small Gary Gabrielsen MPT [facility] [phone] Ph:[phone]

## 2023-06-24 ENCOUNTER — Ambulatory Visit (HOSPITAL_COMMUNITY): Payer: BC Managed Care – PPO

## 2023-06-24 DIAGNOSIS — Z96652 Presence of left artificial knee joint: Secondary | ICD-10-CM

## 2023-06-24 DIAGNOSIS — M25662 Stiffness of left knee, not elsewhere classified: Secondary | ICD-10-CM

## 2023-06-24 DIAGNOSIS — M6281 Muscle weakness (generalized): Secondary | ICD-10-CM

## 2023-06-24 NOTE — Therapy (Signed)
OUTPATIENT PHYSICAL THERAPY LOWER EXTREMITY TREATMENT   Patient Name: Julie Ryan MRN: [id number] DOB:February 09, 1966, 57 y.o., female Today's Date: 06/24/2023  END OF SESSION:  PT End of Session - 06/24/23 1257     Visit Number 11    Number of Visits 16    Date for PT Re-Evaluation 07/06/23    Authorization Type [employer] state    Progress Note Due on Visit 10    PT Start Time 1257    PT Stop Time 1340    PT Time Calculation (min) 43 min    Activity Tolerance Patient tolerated treatment well    Behavior During Therapy [facility] for tasks assessed/performed               Past Medical History:  Diagnosis Date   Angio-edema    Anxiety    Arthritis    Phreesia 08/19/2020   Carpal tunnel syndrome of right wrist    Chronic knee pain    Depression    Endometrial polyp 08/09/2017   will get appt with Dr Despina Hidden   Fibroids 08/09/2017   Has multiple small fibroids    GERD (gastroesophageal reflux disease)    Hypertension    Neuropathy    Obesity    Prediabetes    Seizures (HCC)    1 seizure as a chold; unknown etiology and has never been on meds   Sleep apnea    Past Surgical History:  Procedure Laterality Date   APPENDECTOMY     CARPAL TUNNEL RELEASE Right 08/01/2015   Procedure: RIGHT CARPAL TUNNEL RELEASE;  Surgeon: Vickki Hearing, MD;  Location: [facility];  Service: Orthopedics;  Laterality: Right;   CERVICAL POLYPECTOMY  09/14/2017   Procedure: ENDOMETRIAL POLYPECTOMY;  Surgeon: Lazaro Arms, MD;  Location: [facility];  Service: Gynecology;;   COLONOSCOPY N/A 06/15/2017   Procedure: COLONOSCOPY;  Surgeon: Malissa Hippo, MD;  Location: [facility];  Service: Endoscopy;  Laterality: N/A;  830   ENDOMETRIAL ABLATION N/A 09/14/2017   Procedure: ENDOMETRIAL ABLATION( [place]);  Surgeon: Lazaro Arms, MD;  Location: [facility];  Service: Gynecology;  Laterality: N/A;   ESOPHAGOGASTRODUODENOSCOPY N/A 05/25/2013   Procedure: ESOPHAGOGASTRODUODENOSCOPY (EGD);  Surgeon: Malissa Hippo, MD;  Location: [facility];  Service: Endoscopy;  Laterality: N/A;  220   GASTRIC BYPASS     HYSTEROSCOPY WITH D & C N/A 09/14/2017   Procedure: DILATATION AND CURETTAGE /HYSTEROSCOPY;  Surgeon: Lazaro Arms, MD;  Location: [facility];  Service: Gynecology;  Laterality: N/A;   TOTAL KNEE ARTHROPLASTY Left 05/11/2023   Procedure: TOTAL KNEE ARTHROPLASTY;  Surgeon: Vickki Hearing, MD;  Location: [facility];  Service: Orthopedics;  Laterality: Left;   TUBAL LIGATION     Patient Active Problem List   Diagnosis Date Noted   s/p left knee replacement 05/11/23 05/27/2023   Primary osteoarthritis of left knee 05/11/2023   Osteoarthritis of left knee 05/11/2023   COVID-19 04/04/2023   Low serum vitamin B12 12/22/2022   Shortness of breath 07/06/2022   Multifocal pneumonia 05/13/2022   Bilateral primary osteoarthritis of knee 06/02/2021   Angioedema 01/01/2021   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 factor (HCC) 08/28/2020   High risk medication use 08/28/2020   Neck pain 08/19/2020   Dermatomycosis 07/01/2020   Encounter for screening fecal occult blood testing 02/26/2020   Encounter for gynecological examination with Papanicolaou smear of cervix 02/26/2020   Knee instability, left 09/06/2019   Hiatal hernia 07/17/2019  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   Back spasm 04/12/2018   Endometrial polyp 08/09/2017   Fibroids 08/09/2017   Vaginal dryness 07/18/2017   Dyslipidemia 06/27/2017   Leg edema 03/08/2017   GAD (generalized anxiety disorder) 06/01/2016   Metabolic syndrome X 07/08/2013   Erosive esophagitis 06/25/2013   GERD (gastroesophageal reflux disease) 03/22/2013   Left knee pain 03/22/2013   Snoring 08/05/2011   Urinary incontinence 08/05/2011   Vitamin D deficiency 03/21/2011   Allergic rhinitis 09/20/2010   Back pain 03/17/2010   MORBID OBESITY 02/11/2010   Essential hypertension 02/11/2010    PCP: Kerri Perches,  MD   REFERRING PROVIDER: Vickki Hearing, MD   REFERRING DIAG: [phone] (ICD-10-CM) - Primary osteoarthritis of left knee  THERAPY DIAG:  Stiffness of left knee, not elsewhere classified  Status post total left knee replacement  Muscle weakness (generalized)  Rationale for Evaluation and Treatment: Rehabilitation  ONSET DATE: 05/11/23  SUBJECTIVE:   SUBJECTIVE STATEMENT: Walking with SPC; left knee feels ok today; was hurting yesterday.  6/10 soreness PERTINENT HISTORY: S/p L TKA on 05/11/23  PAIN:  Are you having pain? Yes: NPRS scale: 9-10/10 Pain location: L knee with bending  Pain description: sharp    PRECAUTIONS: Knee and Fall  RED FLAGS: None   WEIGHT BEARING RESTRICTIONS: No  FALLS:  Has patient fallen in last 6 months? 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  DIAGNOSTIC FINDINGS: N/A   PATIENT SURVEYS:  LEFS 42.5% (34/80)  COGNITION: Overall cognitive status: Within functional limits for tasks assessed     SENSATION: WFL  POSTURE: rounded shoulders and forward head  PALPATION: Unable to assess knee incision 2/2 pant style   LOWER EXTREMITY ROM:  Active ROM Right eval Left eval Left 06/01/23 Left 06/22/23  Hip flexion      Hip extension      Hip abduction      Hip adduction      Hip internal rotation      Hip external rotation      Knee flexion  68 78 100  Knee extension  0  0  Ankle dorsiflexion      Ankle plantarflexion      Ankle inversion      Ankle eversion        (Blank rows = not tested)  LOWER EXTREMITY MMT:  MMT Right eval Left eval  Hip flexion WFL   Hip extension    Hip abduction WFL   Hip adduction    Hip internal rotation    Hip external rotation    Knee flexion WFL   Knee extension WFL   Ankle dorsiflexion WFL   Ankle plantarflexion    Ankle inversion    Ankle eversion     (Blank rows = not tested)  -->14 degree extension lag with SLR at eval  FUNCTIONAL TESTS:  5 times sit to stand: 36.75 seconds 2 minute walk test: to be completed visit #2   GAIT: Distance walked: 30' Assistive device utilized: Single point cane  Level of assistance: Complete Independence Comments: decreased stance time on L, good heel strike noted    TODAY'S TREATMENT:                                                                                                                              DATE:  06/24/23 Bike seat 13 x 6' dynamic warm up/ROM Heel/toe raises x 20 Knee drives for flexion x 2' on 8" box 6" box step ups 2 x 10 Squats with chair for target 2 x 10 GTB TKE x 20 Seated: 2# LAQ 2 x 10      06/22/23 Bike seat 13 x 5' dynamic warm up/ROM Standing: Heel/toe raises x 20 Knee drives 12" box for flexion x 2' Mini squats to chair for target 2 x 10 4" step ups with 1 UE assist 2 x 10 4" lateral step ups with 2 UE assist 2 x 10 Tandem stance x 10" each Supine: AROM: left knee 0 to 100 SLR 2 x 5       06/17/23: Nustep, seat 9, 6' SAQ on foam roller with stretch strap assist,  2 x 10, 3" pause SAQ on foam roller into SLR with stretch strap assist, 2 x 10 Knee to chest on Physio ball with stretch strap assist, 3' Hamstring curl on Physio ball, 2 x 15 LAQ with 2# AW, 2x 15, 3 sec eccentric  Weight shifts into SLS on left 10 x 3"  Weight shift anterior-posterior (knee over toes)  Mini squats to mat, 2 x 8   06/13/23: Supine: Quad set with heel slide on foam roller x 15 SAQ on foam roller with stretch strap assist,  2  x 10 SAQ on foam roller into SLR with stretch strap assist,  2 x 10 Seated knee flexion and extension with manual resistance 2 x 15 Contract-relax for knee flexion with manual distraction between sets, 5' Weight shifts into SLS on left 5 x 5"  Mini squats to mat, 2 x 8  Lt Knee ROM: 95 flex, 3 Ext (0 PROM)   06/10/23:  Bike seat 13 half revolutions x 5' dynamic warm up with SPC 240ft (Ambulated with SPC through session). Knee drive on 29FA step 5x 20"   Supine Quad set 10x SLR with quad set prior raise- extension lag SAQ with half bolster 10x Bridge 5x 5"  Manual decongestive techniques with LE elevated  Prone:  Quad stretch with rope 3x 30"  AROM at EOS: 3-81 degrees  06/08/23 STM over quad, calf, posterior knee, and around incision.  Quad sets x 10 SAQ on foam roll 2 x 10 3" hold Heel slides AAROM with strap x 10 Seated knee flexion and extension with manual resistance  Weight shift to single leg stance x 10  Gait training with SPC 100 feet Bike seat 13 half revolutions x 5' dynamic warm up     PATIENT EDUCATION:  Education details: HEP, POC, goals  Person educated: Patient Education method: Medical illustrator Education comprehension: verbalized understanding and returned demonstration  HOME EXERCISE PROGRAM: Encouraged continued performance of HEP given by HHPT after review of exercises provided Access Code: [facility] URL: [email or web address] Date: 06/06/2023 Prepared by: [facility] - [facility]  Exercises - Supine Ankle Pumps  - 1 x daily - 7 x weekly - 3 sets - 15 reps - Supine Quadricep Sets  - 1 x daily - 7 x weekly - 3 sets - 15 reps - Supine Active Straight Leg Raise  - 1 x daily - 7 x weekly - 3 sets - 15 reps - Supine Heel Slides  - 1 x daily - 7 x weekly - 3 sets - 15 reps - Seated Long Arc Quad  - 1 x daily - 7 x weekly - 3 sets - 15 reps - Seated Knee Flexion Stretch  - 1 x daily - 7 x weekly - 3 sets - 15 reps - Sit to Stand  with Armchair  - 2 x daily - 7 x weekly - 2 sets - 5 reps - Heel Raises with Counter Support  - 2 x daily - 7 x weekly - 2 sets - 10 reps - knee bends on the step using rails to hold onto  - 2 x daily - 7 x weekly - 2 sets - 10 reps  06/10/23: - Supine Short Arc Quad  - 2 x daily - 7 x weekly - 2 sets - 10 reps - 5" hold - Straight Leg Raise  - 2 x daily - 7 x weekly - 1 sets - 10 reps - Supine Bridge  - 1 x daily - 7 x weekly - 3 sets - 10 reps - Prone Quadriceps Stretch with Strap  - 2 x daily - 7 x weekly - 3 sets - 3 reps - 30" hold  ASSESSMENT:  CLINICAL IMPRESSION:   Started with a warm up on the bike and patient able to make full forward revolutions today.increased box height with step ups to 6" without issue. Continues with some complaint of numbness left lateral knee; tightness in knee.   Patient will continue to benefit from PT services to progress toward goals, improve ROM, strength, and promote optimal return to PLOF.    OBJECTIVE IMPAIRMENTS: Abnormal gait, decreased activity tolerance, decreased endurance, decreased mobility, difficulty walking, decreased ROM, decreased strength, hypomobility, impaired flexibility, and pain.   ACTIVITY LIMITATIONS: squatting, sleeping, stairs, and locomotion level  PARTICIPATION LIMITATIONS: cleaning, laundry, driving, community activity, and yard work  PERSONAL FACTORS: Age, Past/current experiences, and Time since onset of injury/illness/exacerbation are also affecting patient's functional outcome.   REHAB POTENTIAL: Good  CLINICAL DECISION MAKING: Stable/uncomplicated  EVALUATION COMPLEXITY: Low   GOALS: Goals reviewed with patient? Yes  SHORT TERM GOALS: Target date: 06/14/2023  Patient will be independent in HEP to improve strength/mobility for better functional independence with ADLs. Baseline: Goal status: In progress  LONG TERM GOALS: Target date: 07/05/2023   Patient will increase lower extremity functional scale to  >60/80 to demonstrate improved functional mobility and increased tolerance with ADLs.  Baseline: 11/20: 42.5% Goal status: In progress  2.  Patient will increase BLE gross strength to 4+/5 as to improve functional strength for independent gait, increased standing tolerance and increased ADL ability. Baseline:  Goal status: in progress  3.  Patient will demonstrates SLR with 0 degree extension lag to demonstrate improved quad strength.  Baseline: 11/20: 14 degree extension lag  Goal status: In progress  4.   Patient (< 74 years old) will complete five times sit to stand test in < 10 seconds indicating an increased LE strength and improved balance. Baseline: 11/20: 36.75 seconds Goal status: in progress   5.  Patient will demonstrate L knee AROM symmetrical to R knee for improve gait ability and overall mobility.  Baseline: 11/20: see above  Goal status: In progress   PLAN:  PT FREQUENCY:  1-3x/week  PT DURATION: 6 weeks  PLANNED INTERVENTIONS: 97164- PT Re-evaluation, 97110-Therapeutic exercises, 97530- Therapeutic activity, 97112- Neuromuscular re-education, 97535- Self Care, 36644- Manual therapy, [phone]- Gait training, 97014- Electrical stimulation (unattended), [phone]- Electrical stimulation (manual), Patient/Family education, Stair training, Dry Needling, Joint mobilization, Joint manipulation, Scar mobilization, DME instructions, Cryotherapy, and Moist heat  PLAN FOR NEXT SESSION:  End range quad strengthening, knee flexion ROM, balancing and standing activities   1:44 PM, 06/24/23 Kelyn Ponciano Small Shelita Steptoe MPT [facility] [place] [phone] [phone]

## 2023-06-27 ENCOUNTER — Ambulatory Visit (HOSPITAL_COMMUNITY): Payer: BC Managed Care – PPO

## 2023-06-27 DIAGNOSIS — M6281 Muscle weakness (generalized): Secondary | ICD-10-CM

## 2023-06-27 DIAGNOSIS — Z96652 Presence of left artificial knee joint: Secondary | ICD-10-CM | POA: Diagnosis not present

## 2023-06-27 DIAGNOSIS — M25662 Stiffness of left knee, not elsewhere classified: Secondary | ICD-10-CM

## 2023-06-27 NOTE — Therapy (Signed)
OUTPATIENT PHYSICAL THERAPY LOWER EXTREMITY TREATMENT   Patient Name: CHALON YAZDANI MRN: [id number] DOB:21-Nov-1965, 57 y.o., female Today's Date: 06/27/2023  END OF SESSION:  PT End of Session - 06/27/23 1309     Visit Number 12    Number of Visits 16    Date for PT Re-Evaluation 07/06/23    Authorization Type [employer] state    Progress Note Due on Visit 10    PT Start Time 0108    PT Stop Time 0146    PT Time Calculation (min) 38 min    Activity Tolerance Patient tolerated treatment well    Behavior During Therapy [facility] for tasks assessed/performed               Past Medical History:  Diagnosis Date   Angio-edema    Anxiety    Arthritis    Phreesia 08/19/2020   Carpal tunnel syndrome of right wrist    Chronic knee pain    Depression    Endometrial polyp 08/09/2017   will get appt with Dr Despina Hidden   Fibroids 08/09/2017   Has multiple small fibroids    GERD (gastroesophageal reflux disease)    Hypertension    Neuropathy    Obesity    Prediabetes    Seizures (HCC)    1 seizure as a chold; unknown etiology and has never been on meds   Sleep apnea    Past Surgical History:  Procedure Laterality Date   APPENDECTOMY     CARPAL TUNNEL RELEASE Right 08/01/2015   Procedure: RIGHT CARPAL TUNNEL RELEASE;  Surgeon: Vickki Hearing, MD;  Location: [facility];  Service: Orthopedics;  Laterality: Right;   CERVICAL POLYPECTOMY  09/14/2017   Procedure: ENDOMETRIAL POLYPECTOMY;  Surgeon: Lazaro Arms, MD;  Location: [facility];  Service: Gynecology;;   COLONOSCOPY N/A 06/15/2017   Procedure: COLONOSCOPY;  Surgeon: Malissa Hippo, MD;  Location: [facility];  Service: Endoscopy;  Laterality: N/A;  830   ENDOMETRIAL ABLATION N/A 09/14/2017   Procedure: ENDOMETRIAL ABLATION( [place]);  Surgeon: Lazaro Arms, MD;  Location: [facility];  Service: Gynecology;  Laterality: N/A;   ESOPHAGOGASTRODUODENOSCOPY N/A 05/25/2013   Procedure: ESOPHAGOGASTRODUODENOSCOPY (EGD);  Surgeon: Malissa Hippo, MD;  Location: [facility];  Service: Endoscopy;  Laterality: N/A;  220   GASTRIC BYPASS     HYSTEROSCOPY WITH D & C N/A 09/14/2017   Procedure: DILATATION AND CURETTAGE /HYSTEROSCOPY;  Surgeon: Lazaro Arms, MD;  Location: [facility];  Service: Gynecology;  Laterality: N/A;   TOTAL KNEE ARTHROPLASTY Left 05/11/2023   Procedure: TOTAL KNEE ARTHROPLASTY;  Surgeon: Vickki Hearing, MD;  Location: [facility];  Service: Orthopedics;  Laterality: Left;   TUBAL LIGATION     Patient Active Problem List   Diagnosis Date Noted   s/p left knee replacement 05/11/23 05/27/2023   Primary osteoarthritis of left knee 05/11/2023   Osteoarthritis of left knee 05/11/2023   COVID-19 04/04/2023   Low serum vitamin B12 12/22/2022   Shortness of breath 07/06/2022   Multifocal pneumonia 05/13/2022   Bilateral primary osteoarthritis of knee 06/02/2021   Angioedema 01/01/2021   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 factor (HCC) 08/28/2020   High risk medication use 08/28/2020   Neck pain 08/19/2020   Dermatomycosis 07/01/2020   Encounter for screening fecal occult blood testing 02/26/2020   Encounter for gynecological examination with Papanicolaou smear of cervix 02/26/2020   Knee instability, left 09/06/2019   Hiatal hernia 07/17/2019  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   Back spasm 04/12/2018   Endometrial polyp 08/09/2017   Fibroids 08/09/2017   Vaginal dryness 07/18/2017   Dyslipidemia 06/27/2017   Leg edema 03/08/2017   GAD (generalized anxiety disorder) 06/01/2016   Metabolic syndrome X 07/08/2013   Erosive esophagitis 06/25/2013   GERD (gastroesophageal reflux disease) 03/22/2013   Left knee pain 03/22/2013   Snoring 08/05/2011   Urinary incontinence 08/05/2011   Vitamin D deficiency 03/21/2011   Allergic rhinitis 09/20/2010   Back pain 03/17/2010   MORBID OBESITY 02/11/2010   Essential hypertension 02/11/2010    PCP: Kerri Perches,  MD   REFERRING PROVIDER: Vickki Hearing, MD   REFERRING DIAG: [phone] (ICD-10-CM) - Primary osteoarthritis of left knee  THERAPY DIAG:  Stiffness of left knee, not elsewhere classified  Status post total left knee replacement  Muscle weakness (generalized)  Rationale for Evaluation and Treatment: Rehabilitation  ONSET DATE: 05/11/23  SUBJECTIVE:   SUBJECTIVE STATEMENT: Patient reports her left knee "got cold" over the weekend; 4.5/10 pain on arrival today.    PERTINENT HISTORY: S/p L TKA on 05/11/23  PAIN:  Are you having pain? Yes: NPRS scale: 9-10/10 Pain location: L knee with bending  Pain description: sharp    PRECAUTIONS: Knee and Fall  RED FLAGS: None   WEIGHT BEARING RESTRICTIONS: No  FALLS:  Has patient fallen in last 6 months? 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  DIAGNOSTIC FINDINGS: N/A   PATIENT SURVEYS:  LEFS 42.5% (34/80)  COGNITION: Overall cognitive status: Within functional limits for tasks assessed     SENSATION: WFL  POSTURE: rounded shoulders and forward head  PALPATION: Unable to assess knee incision 2/2 pant style   LOWER EXTREMITY ROM:  Active ROM Right eval Left eval Left 06/01/23 Left 06/22/23  Hip flexion      Hip extension      Hip abduction      Hip adduction      Hip internal rotation      Hip external rotation      Knee flexion  68 78 100  Knee extension  0  0  Ankle dorsiflexion      Ankle plantarflexion      Ankle inversion      Ankle  eversion       (Blank rows = not tested)  LOWER EXTREMITY MMT:  MMT Right eval Left eval  Hip flexion WFL   Hip extension    Hip abduction WFL   Hip adduction    Hip internal rotation    Hip external rotation    Knee flexion WFL   Knee extension WFL   Ankle dorsiflexion WFL   Ankle plantarflexion    Ankle inversion    Ankle eversion     (Blank rows = not tested)  -->14 degree extension lag with SLR at eval  FUNCTIONAL TESTS:  5 times sit to stand: 36.75 seconds 2 minute walk test: to be completed visit #2   GAIT: Distance walked: 30' Assistive device  utilized: Single point cane Level of assistance: Complete Independence Comments: decreased stance time on L, good heel strike noted    TODAY'S TREATMENT:                                                                                                                              DATE:  06/27/23 Bike seat 13 x 6' dynamic warm up/ROM Heel/toe raises x 20 12" box knee drives for flexion x 2' Squats to chair to chair 2 x 10 6" box step ups 2 x 10 Tandem stance 2 x 30" each Seated manual knee flexion x 10 reps Ended with bike x 2 full revolutions    06/24/23 Bike seat 13 x 6' dynamic warm up/ROM Heel/toe raises x 20 Knee drives for flexion x 2' on 8" box 6" box step ups 2 x 10 Squats with chair for target 2 x 10 GTB TKE x 20 Seated: 2# LAQ 2 x 10      06/22/23 Bike seat 13 x 5' dynamic warm up/ROM Standing: Heel/toe raises x 20 Knee drives 12" box for flexion x 2' Mini squats to chair for target 2 x 10 4" step ups with 1 UE assist 2 x 10 4" lateral step ups with 2 UE assist 2 x 10 Tandem stance x 10" each Supine: AROM: left knee 0 to 100 SLR 2 x 5       06/17/23: Nustep, seat 9, 6' SAQ on foam roller with stretch strap assist,  2 x 10, 3" pause SAQ on foam roller into SLR with stretch strap assist, 2 x 10 Knee to chest on Physio ball with stretch strap assist, 3' Hamstring curl on Physio  ball, 2 x 15 LAQ with 2# AW, 2x 15, 3 sec eccentric  Weight shifts into SLS on left 10 x 3"  Weight shift anterior-posterior (knee over toes)  Mini squats to mat, 2 x 8   06/13/23: Supine: Quad set with heel slide on foam roller x 15 SAQ on foam roller with stretch strap assist,  2 x 10 SAQ on foam roller into SLR with stretch strap assist,  2 x 10 Seated knee flexion and extension with manual resistance 2 x 15 Contract-relax for knee flexion with manual distraction between sets, 5' Weight shifts into SLS on left 5 x 5"  Mini squats to mat, 2 x 8  Lt Knee ROM: 95 flex, 3 Ext (0 PROM)   06/10/23:  Bike seat 13 half revolutions x 5' dynamic warm up with SPC 292ft (Ambulated with SPC through session). Knee drive on 16XW step 5x 20"   Supine Quad set 10x SLR with quad set prior raise- extension lag SAQ with half bolster 10x Bridge 5x 5"  Manual decongestive techniques with LE elevated  Prone:  Quad stretch with rope 3x 30"  AROM at EOS: 3-81 degrees  06/08/23 STM over quad, calf, posterior knee, and around incision.  Quad sets x 10  SAQ on foam roll 2 x 10 3" hold Heel slides AAROM with strap x 10 Seated knee flexion and extension with manual resistance  Weight shift to single leg stance x 10  Gait training with SPC 100 feet Bike seat 13 half revolutions x 5' dynamic warm up     PATIENT EDUCATION:  Education details: HEP, POC, goals  Person educated: Patient Education method: Medical illustrator Education comprehension: verbalized understanding and returned demonstration  HOME EXERCISE PROGRAM: Encouraged continued performance of HEP given by HHPT after review of exercises provided Access Code: [facility] URL: [email or web address] Date: 06/06/2023 Prepared by: [facility]  Exercises - Supine Ankle Pumps  - 1 x daily - 7 x weekly - 3 sets - 15 reps - Supine Quadricep Sets  - 1 x daily - 7 x weekly - 3 sets - 15 reps - Supine Active  Straight Leg Raise  - 1 x daily - 7 x weekly - 3 sets - 15 reps - Supine Heel Slides  - 1 x daily - 7 x weekly - 3 sets - 15 reps - Seated Long Arc Quad  - 1 x daily - 7 x weekly - 3 sets - 15 reps - Seated Knee Flexion Stretch  - 1 x daily - 7 x weekly - 3 sets - 15 reps - Sit to Stand with Armchair  - 2 x daily - 7 x weekly - 2 sets - 5 reps - Heel Raises with Counter Support  - 2 x daily - 7 x weekly - 2 sets - 10 reps - knee bends on the step using rails to hold onto  - 2 x daily - 7 x weekly - 2 sets - 10 reps  06/10/23: - Supine Short Arc Quad  - 2 x daily - 7 x weekly - 2 sets - 10 reps - 5" hold - Straight Leg Raise  - 2 x daily - 7 x weekly - 1 sets - 10 reps - Supine Bridge  - 1 x daily - 7 x weekly - 3 sets - 10 reps - Prone Quadriceps Stretch with Strap  - 2 x daily - 7 x weekly - 3 sets - 3 reps - 30" hold  ASSESSMENT:  CLINICAL IMPRESSION:   Arrives with SPC; noted decreased antalgic gait today and increased gait speed.  Started with a warm up on the bike and patient initially not able to make full forward revolutions today but is at the end of treatment; added some manual knee flexion to today's treatment.  Able to walk in the PT gym without AD without issue. Patient will continue to benefit from PT services to progress toward goals, improve ROM, strength, and promote optimal return to PLOF.    OBJECTIVE IMPAIRMENTS: Abnormal gait, decreased activity tolerance, decreased endurance, decreased mobility, difficulty walking, decreased ROM, decreased strength, hypomobility, impaired flexibility, and pain.   ACTIVITY LIMITATIONS: squatting, sleeping, stairs, and locomotion level  PARTICIPATION LIMITATIONS: cleaning, laundry, driving, community activity, and yard work  PERSONAL FACTORS: Age, Past/current experiences, and Time since onset of injury/illness/exacerbation are also affecting patient's functional outcome.   REHAB POTENTIAL: Good  CLINICAL DECISION MAKING:  Stable/uncomplicated  EVALUATION COMPLEXITY: Low   GOALS: Goals reviewed with patient? Yes  SHORT TERM GOALS: Target date: 06/14/2023  Patient will be independent in HEP to improve strength/mobility for better functional independence with ADLs. Baseline: Goal status: In progress  LONG TERM GOALS: Target date: 07/05/2023   Patient will increase lower extremity functional scale  to >60/80 to demonstrate improved functional mobility and increased tolerance with ADLs.  Baseline: 11/20: 42.5% Goal status: In progress  2.  Patient will increase BLE gross strength to 4+/5 as to improve functional strength for independent gait, increased standing tolerance and increased ADL ability. Baseline:  Goal status: in progress  3.  Patient will demonstrates SLR with 0 degree extension lag to demonstrate improved quad strength.  Baseline: 11/20: 14 degree extension lag  Goal status: In progress  4.   Patient (< 41 years old) will complete five times sit to stand test in < 10 seconds indicating an increased LE strength and improved balance. Baseline: 11/20: 36.75 seconds Goal status: in progress   5.  Patient will demonstrate L knee AROM symmetrical to R knee for improve gait ability and overall mobility.  Baseline: 11/20: see above  Goal status: In progress   PLAN:  PT FREQUENCY:  1-3x/week  PT DURATION: 6 weeks  PLANNED INTERVENTIONS: 97164- PT Re-evaluation, 97110-Therapeutic exercises, 97530- Therapeutic activity, 97112- Neuromuscular re-education, 97535- Self Care, 25366- Manual therapy, [phone]- Gait training, 97014- Electrical stimulation (unattended), [phone]- Electrical stimulation (manual), Patient/Family education, Stair training, Dry Needling, Joint mobilization, Joint manipulation, Scar mobilization, DME instructions, Cryotherapy, and Moist heat  PLAN FOR NEXT SESSION:  End range quad strengthening, knee flexion ROM, balancing and standing activities   1:42 PM, 06/27/23 Zephyr Ridley  Small Amillya Chavira MPT [place] [facility]  [phone] Ph:[phone]

## 2023-07-07 ENCOUNTER — Telehealth (HOSPITAL_COMMUNITY): Payer: Self-pay

## 2023-07-07 ENCOUNTER — Ambulatory Visit (HOSPITAL_COMMUNITY): Payer: 59 | Attending: Orthopedic Surgery

## 2023-07-07 DIAGNOSIS — Z96652 Presence of left artificial knee joint: Secondary | ICD-10-CM | POA: Insufficient documentation

## 2023-07-07 DIAGNOSIS — M6281 Muscle weakness (generalized): Secondary | ICD-10-CM | POA: Insufficient documentation

## 2023-07-07 DIAGNOSIS — M25662 Stiffness of left knee, not elsewhere classified: Secondary | ICD-10-CM | POA: Insufficient documentation

## 2023-07-07 NOTE — Telephone Encounter (Signed)
 Called and left message regarding missed appointment this morning.  Left reminder of upcoming appt on 07/12/23.  12:06 PM, 07/07/23 Bess Saltzman Small Chia Mowers MPT [place] [place] [phone]

## 2023-07-12 ENCOUNTER — Ambulatory Visit (HOSPITAL_COMMUNITY): Payer: BC Managed Care – PPO

## 2023-07-12 DIAGNOSIS — M25662 Stiffness of left knee, not elsewhere classified: Secondary | ICD-10-CM

## 2023-07-12 DIAGNOSIS — Z96652 Presence of left artificial knee joint: Secondary | ICD-10-CM | POA: Diagnosis present

## 2023-07-12 DIAGNOSIS — M6281 Muscle weakness (generalized): Secondary | ICD-10-CM

## 2023-07-12 NOTE — Therapy (Signed)
 OUTPATIENT PHYSICAL THERAPY LOWER EXTREMITY TREATMENT/PROGRESS NOTE Progress Note Reporting Period 05/24/24  to 07/12/2023  See note below for Objective Data and Assessment of Progress/Goals.       Patient Name: Julie Ryan MRN: [id number] DOB:1965-12-27, 58 y.o., female Today's Date: 07/12/2023  END OF SESSION:  PT End of Session - 07/12/23 1157     Visit Number 13   late arrival   Number of Visits 20    Date for PT Re-Evaluation 08/11/23    Authorization Type [employer] state    Progress Note Due on Visit 10    PT Start Time 1157    PT Stop Time 1230    PT Time Calculation (min) 33 min    Activity Tolerance Patient tolerated treatment well    Behavior During Therapy [facility] for tasks assessed/performed               Past Medical History:  Diagnosis Date   Angio-edema    Anxiety    Arthritis    [employer]rees[employer] 08/19/2020   Carpal tunnel syndrome of right wrist    Chronic knee pain    Depression    Endometrial polyp 08/09/2017   will get appt with Dr Jayne   Fibroids 08/09/2017   Has multiple small fibroids    GERD (gastroesophageal reflux disease)    Hypertension    Neuropathy    Obesity    Prediabetes    Seizures (HCC)    1 seizure as a chold; unknown etiology and has never been on meds   Sleep apnea    Past Surgical History:  Procedure Laterality Date   APPENDECTOMY     CARPAL TUNNEL RELEASE Right 08/01/2015   Procedure: RIGHT CARPAL TUNNEL RELEASE;  Surgeon: Taft FORBES Minerva, MD;  Location: [facility];  Service: Orthopedics;  Laterality: Right;   CERVICAL POLYPECTOMY  09/14/2017   Procedure: ENDOMETRIAL POLYPECTOMY;  Surgeon: Jayne Vonn DEL, MD;  Location: [facility];  Service: Gynecology;;   COLONOSCOPY N/A 06/15/2017   Procedure: COLONOSCOPY;  Surgeon: Golda Claudis PENNER, MD;  Location: [facility];  Service: Endoscopy;  Laterality: N/A;  830   ENDOMETRIAL ABLATION N/A 09/14/2017   Procedure: ENDOMETRIAL ABLATION( Minerva);  Surgeon: Jayne Vonn DEL, MD;  Location: [facility];  Service: Gynecology;  Laterality: N/A;   ESOPHAGOGASTRODUODENOSCOPY N/A 05/25/2013   Procedure: ESOPHAGOGASTRODUODENOSCOPY (EGD);  Surgeon: Claudis PENNER Golda, MD;  Location: [facility];  Service: Endoscopy;  Laterality: N/A;  220   GASTRIC BYPASS     HYSTEROSCOPY WITH D & C N/A 09/14/2017   Procedure: DILATATION AND CURETTAGE /HYSTEROSCOPY;  Surgeon: Jayne Vonn DEL, MD;  Location: [facility];  Service: Gynecology;  Laterality: N/A;   TOTAL KNEE ARTHROPLASTY Left 05/11/2023   Procedure: TOTAL KNEE ARTHROPLASTY;  Surgeon: Minerva Taft FORBES, MD;  Location: [facility];  Service: Orthopedics;  Laterality: Left;   TUBAL LIGATION     Patient Active Problem List   Diagnosis Date Noted   s/p left knee replacement 05/11/23 05/27/2023   Primary osteoarthritis of left knee 05/11/2023   Osteoarthritis of left knee 05/11/2023   COVID-19 04/04/2023   Low serum vitamin B12 12/22/2022   Shortness of breath 07/06/2022   Multifocal pneumonia 05/13/2022   Bilateral primary osteoarthritis of knee 06/02/2021   Angioedema 01/01/2021   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 factor (HCC) 08/28/2020   High risk medication use 08/28/2020   Neck pain 08/19/2020   Dermatomycosis 07/01/2020   Encounter for screening fecal occult blood  testing 02/26/2020   Encounter for gynecological examination with Papanicolaou smear of cervix 02/26/2020   Knee instability, left 09/06/2019   Hiatal hernia 07/17/2019   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   Back spasm 04/12/2018   Endometrial polyp 08/09/2017   Fibroids 08/09/2017   Vaginal dryness 07/18/2017   Dyslipidemia 06/27/2017   Leg edema 03/08/2017   GAD (generalized anxiety disorder) 06/01/2016   Metabolic syndrome X 07/08/2013   Erosive esophagitis 06/25/2013   GERD (gastroesophageal reflux disease) 03/22/2013   Left knee pain 03/22/2013   Snoring 08/05/2011   Urinary incontinence 08/05/2011   Vitamin D  deficiency  03/21/2011   Allergic rhinitis 09/20/2010   Back pain 03/17/2010   MORBID OBESITY 02/11/2010   Essential hypertension 02/11/2010    PCP: Antonetta Rollene BRAVO, MD   REFERRING PROVIDER: Margrette Taft BRAVO, MD   REFERRING DIAG: [phone] (ICD-10-CM) - Primary osteoarthritis of left knee  THERAPY DIAG:  Stiffness of left knee, not elsewhere classified - Plan: PT plan of care cert/re-cert  Status post total left knee replacement - Plan: PT plan of care cert/re-cert  Muscle weakness (generalized) - Plan: PT plan of care cert/re-cert  Rationale for Evaluation and Treatment: Rehabilitation  ONSET DATE: 05/11/23  SUBJECTIVE:   SUBJECTIVE STATEMENT: 75% percent better overall; still having some swelling PERTINENT HISTORY: S/p L TKA on 05/11/23  PAIN:  Are you having pain? Yes: NPRS scale: 9-10/10 Pain location: L knee with bending  Pain description: sharp    PRECAUTIONS: Knee and Fall  RED FLAGS: None   WEIGHT BEARING RESTRICTIONS: No  FALLS:  Has patient fallen in last 6 months? 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  DIAGNOSTIC FINDINGS: N/A   PATIENT SURVEYS:  LEFS 42.5% (34/80)  COGNITION: Overall cognitive status: Within functional limits for tasks assessed     SENSATION: WFL  POSTURE: rounded shoulders and forward head  PALPATION: Unable to assess knee incision 2/2 pant style   LOWER EXTREMITY ROM:  Active ROM Right eval Left eval Left 06/01/23 Left 06/22/23 Left 07/12/23  Hip flexion        Hip extension       Hip abduction       Hip adduction       Hip internal rotation       Hip external rotation       Knee flexion  68 78 100 105  Knee extension  0  0 0  Ankle dorsiflexion       Ankle plantarflexion       Ankle inversion       Ankle eversion        (Blank rows = not tested)  LOWER EXTREMITY MMT:  MMT Right eval Left eval Left 07/12/23  Hip flexion WFL    Hip extension     Hip abduction WFL    Hip adduction     Hip internal rotation     Hip external rotation     Knee flexion WFL  4+  Knee extension WFL  5  Ankle  dorsiflexion WFL    Ankle plantarflexion     Ankle inversion     Ankle eversion      (Blank rows = not tested)  -->14 degree extension lag with SLR at eval  FUNCTIONAL TESTS:  5 times sit to stand: 36.75 seconds 2 minute walk test: to be completed visit #2   GAIT: Distance walked: 30' Assistive device utilized: Single point cane Level of assistance: Complete Independence Comments: decreased stance time on L, good heel strike noted    TODAY'S TREATMENT:                                                                                                                              DATE:  07/12/23 Nustep seat 9 x 5' dynamic warm up Progress note LEFS 31/80 AROM and MMTs see above 2 MWT 408 ft no AD 5 times sit to stand test 14.68 sec Standing knee flexion drives on step x 2'  06/27/23 Bike seat 13 x 6' dynamic warm up/ROM Heel/toe raises x 20 12 box knee drives for flexion x 2' Squats to chair to chair 2 x 10 6 box step ups 2 x 10 Tandem stance 2 x 30 each Seated manual knee flexion x 10 reps Ended with bike x 2 full revolutions    06/24/23 Bike seat 13 x 6' dynamic warm up/ROM Heel/toe raises x 20 Knee drives for flexion x 2' on 8 box 6 box step ups 2 x 10 Squats with chair for target 2 x 10 GTB TKE x 20 Seated: 2# LAQ 2 x 10      06/22/23 Bike seat 13 x 5' dynamic warm up/ROM Standing: Heel/toe raises x  20 Knee drives 12 box for flexion x 2' Mini squats to chair for target 2 x 10 4 step ups with 1 UE assist 2 x 10 4 lateral step ups with 2 UE assist 2 x 10 Tandem stance x 10 each Supine: AROM: left knee 0 to 100 SLR 2 x 5       06/17/23: Nustep, seat 9, 6' SAQ on foam roller with stretch strap assist,  2 x 10, 3 pause SAQ on foam roller into SLR with stretch strap assist, 2 x 10 Knee to chest on Physio ball with stretch strap assist, 3' Hamstring curl on Physio ball, 2 x 15 LAQ with 2# AW, 2x 15, 3 sec eccentric  Weight shifts into SLS on left 10 x 3  Weight shift anterior-posterior (knee over toes)  Mini squats to mat, 2 x 8   06/13/23: Supine: Quad set with heel slide on foam roller x 15 SAQ on foam roller with stretch strap assist,  2 x 10 SAQ on foam roller into SLR with stretch strap assist,  2 x 10 Seated knee flexion and extension with manual resistance 2 x 15 Contract-relax for knee flexion with manual distraction between sets, 5' Weight shifts into SLS on left 5 x 5  Mini squats to mat, 2 x 8  Lt Knee ROM: 95 flex, 3 Ext (0 PROM)   06/10/23:  Bike seat 13 half revolutions x 5' dynamic warm up with SPC 257ft (Ambulated with SPC through session). Knee drive on 87pw step 5x 20   Supine Quad set 10x SLR with quad set prior raise- extension lag SAQ with half bolster 10x Bridge 5x 5  Manual decongestive techniques with LE elevated  Prone:  Quad stretch with rope 3x 30  AROM at EOS: 3-81 degrees  06/08/23 STM over quad, calf, posterior knee, and around incision.  Quad sets x 10 SAQ on foam roll 2 x 10 3 hold Heel slides AAROM with strap x 10 Seated knee flexion and extension with manual resistance  Weight shift to single leg stance x 10  Gait training with SPC 100 feet Bike seat 13 half revolutions x 5' dynamic warm up     PATIENT EDUCATION:  Education details: HEP, POC, goals  Person educated: Patient Education method:  Medical Illustrator Education comprehension: verbalized understanding and returned demonstration  HOME EXERCISE PROGRAM: Encouraged continued performance of HEP given by HHPT after review of exercises provided Access Code: [facility] URL: [email or web address] Date: 06/06/2023 Prepared by: [facility] - [facility]  Exercises - Supine Ankle Pumps  - 1 x daily - 7 x weekly - 3 sets - 15 reps - Supine Quadricep Sets  - 1 x daily - 7 x weekly - 3 sets - 15 reps - Supine Active Straight Leg Raise  - 1 x daily - 7 x weekly - 3 sets - 15 reps - Supine Heel Slides  - 1 x daily - 7 x weekly - 3 sets - 15 reps - Seated Long Arc Quad  - 1 x daily - 7 x weekly - 3 sets - 15 reps - Seated Knee Flexion Stretch  - 1 x daily - 7 x weekly - 3 sets - 15 reps - Sit to Stand with Armchair  - 2 x daily - 7 x weekly - 2 sets - 5 reps - Heel Raises with Counter Support  - 2 x daily - 7 x weekly - 2 sets - 10 reps - knee bends on the step using rails to hold onto  - 2 x daily - 7 x weekly - 2 sets - 10 reps  06/10/23: - Supine Short Arc Quad  - 2 x daily - 7 x weekly - 2 sets - 10 reps - 5 hold - Straight Leg Raise  - 2 x daily - 7 x weekly - 1 sets - 10 reps - Supine Bridge  - 1 x daily - 7 x weekly - 3 sets - 10 reps - Prone Quadriceps Stretch with Strap  - 2 x daily - 7 x weekly - 3 sets - 3 reps - 30 hold  ASSESSMENT:  CLINICAL IMPRESSION:   Progress note today; patient able to walk in PT gym without cane.  She demonstrates good strength; slowly progressing knee flexion; good improvement with functional tests.  We will continue x 2 weeks to work on knee flexion and then discharge to I HEP.    OBJECTIVE IMPAIRMENTS: Abnormal gait, decreased activity tolerance, decreased endurance, decreased mobility, difficulty walking, decreased ROM, decreased strength, hypomobility, impaired flexibility, and pain.   ACTIVITY LIMITATIONS: squatting, sleeping, stairs, and locomotion level  PARTICIPATION  LIMITATIONS: cleaning, laundry, driving, community activity, and yard work  PERSONAL FACTORS: Age, Past/current experiences, and Time since onset of injury/illness/exacerbation are  also affecting patient's functional outcome.   REHAB POTENTIAL: Good  CLINICAL DECISION MAKING: Stable/uncomplicated  EVALUATION COMPLEXITY: Low   GOALS: Goals reviewed with patient? Yes  SHORT TERM GOALS: Target date: 06/14/2023  Patient will be independent in HEP to improve strength/mobility for better functional independence with ADLs. Baseline: Goal status: met  LONG TERM GOALS: Target date: 07/05/2023   Patient will increase lower extremity functional scale to >60/80 to demonstrate improved functional mobility and increased tolerance with ADLs.  Baseline: 11/20: 42.5%; 07/12/23 31/80 38.8%  Goal status: In progress  2.  Patient will increase BLE gross strength to 4+/5 as to improve functional strength for independent gait, increased standing tolerance and increased ADL ability. Baseline:  Goal status: met  3.  Patient will demonstrates SLR with 0 degree extension lag to demonstrate improved quad strength.  Baseline: 11/20: 14 degree extension lag ; 07/12/23 0 Goal status: met  4.   Patient (< 74 years old) will complete five times sit to stand test in < 10 seconds indicating an increased LE strength and improved balance. Baseline: 11/20: 36.75 seconds; 07/11/22 14.68 sec  Goal status: in progress   5.  Patient will demonstrate L knee AROM symmetrical to R knee for improve gait ability and overall mobility.  Baseline: 11/20: see above  Goal status: In progress   PLAN:  PT FREQUENCY:  1-3x/week  PT DURATION: 6 weeks  PLANNED INTERVENTIONS: 97164- PT Re-evaluation, 97110-Therapeutic exercises, 97530- Therapeutic activity, 97112- Neuromuscular re-education, 97535- Self Care, [id number]- Manual therapy, [phone]- Gait training, 97014- Electrical stimulation (unattended), [phone]- Electrical stimulation  (manual), Patient/Family education, Stair training, Dry Needling, Joint mobilization, Joint manipulation, Scar mobilization, DME instructions, Cryotherapy, and Moist heat  PLAN FOR NEXT SESSION:  continue x 2 weeks to work on knee flexion; will then likely be ready for discharge  12:33 PM, 07/12/23 Ladeja Pelham Small Shiva Sahagian MPT  physical therapy [place] [phone] Ph:[phone]

## 2023-07-14 ENCOUNTER — Ambulatory Visit (HOSPITAL_COMMUNITY): Payer: 59

## 2023-07-14 DIAGNOSIS — M6281 Muscle weakness (generalized): Secondary | ICD-10-CM

## 2023-07-14 DIAGNOSIS — M25662 Stiffness of left knee, not elsewhere classified: Secondary | ICD-10-CM | POA: Diagnosis not present

## 2023-07-14 DIAGNOSIS — Z96652 Presence of left artificial knee joint: Secondary | ICD-10-CM

## 2023-07-14 NOTE — Therapy (Signed)
 OUTPATIENT PHYSICAL THERAPY LOWER EXTREMITY TREATMENT   Patient Name: Julie Ryan MRN: [id number] DOB:05-25-1966, 58 y.o., female Today's Date: 07/14/2023  END OF SESSION:  PT End of Session - 07/14/23 1445     Visit Number 14    Number of Visits 20    Date for PT Re-Evaluation 08/11/23    Authorization Type [employer] state    Progress Note Due on Visit 10    PT Start Time 0243    PT Stop Time 0315    PT Time Calculation (min) 32 min    Activity Tolerance Patient tolerated treatment well    Behavior During Therapy [facility] for tasks assessed/performed               Past Medical History:  Diagnosis Date   Angio-edema    Anxiety    Arthritis    Phreesia 08/19/2020   Carpal tunnel syndrome of right wrist    Chronic knee pain    Depression    Endometrial polyp 08/09/2017   will get appt with Dr Jayne   Fibroids 08/09/2017   Has multiple small fibroids    GERD (gastroesophageal reflux disease)    Hypertension    Neuropathy    Obesity    Prediabetes    Seizures (HCC)    1 seizure as a chold; unknown etiology and has never been on meds   Sleep apnea    Past Surgical History:  Procedure Laterality Date   APPENDECTOMY     CARPAL TUNNEL RELEASE Right 08/01/2015   Procedure: RIGHT CARPAL TUNNEL RELEASE;  Surgeon: Taft FORBES Minerva, MD;  Location: [facility];  Service: Orthopedics;  Laterality: Right;   CERVICAL POLYPECTOMY  09/14/2017   Procedure: ENDOMETRIAL POLYPECTOMY;  Surgeon: Jayne Vonn DEL, MD;  Location: [facility];  Service: Gynecology;;   COLONOSCOPY N/A 06/15/2017   Procedure: COLONOSCOPY;  Surgeon: Golda Claudis PENNER, MD;  Location: [facility];  Service: Endoscopy;  Laterality: N/A;  830   ENDOMETRIAL ABLATION N/A 09/14/2017   Procedure: ENDOMETRIAL ABLATION( Minerva);  Surgeon: Jayne Vonn DEL, MD;  Location: [facility];  Service: Gynecology;  Laterality: N/A;   ESOPHAGOGASTRODUODENOSCOPY N/A 05/25/2013   Procedure: ESOPHAGOGASTRODUODENOSCOPY (EGD);  Surgeon: Claudis PENNER Golda,  MD;  Location: [facility];  Service: Endoscopy;  Laterality: N/A;  220   GASTRIC BYPASS     HYSTEROSCOPY WITH D & C N/A 09/14/2017   Procedure: DILATATION AND CURETTAGE /HYSTEROSCOPY;  Surgeon: Jayne Vonn DEL, MD;  Location: [facility];  Service: Gynecology;  Laterality: N/A;   TOTAL KNEE ARTHROPLASTY Left 05/11/2023   Procedure: TOTAL KNEE ARTHROPLASTY;  Surgeon: Minerva Taft FORBES, MD;  Location: [facility];  Service: Orthopedics;  Laterality: Left;   TUBAL LIGATION     Patient Active Problem List   Diagnosis Date Noted   s/p left knee replacement 05/11/23 05/27/2023   Primary osteoarthritis of left knee 05/11/2023   Osteoarthritis of left knee 05/11/2023   COVID-19 04/04/2023   Low serum vitamin B12 12/22/2022   Shortness of breath 07/06/2022   Multifocal pneumonia 05/13/2022   Bilateral primary osteoarthritis of knee 06/02/2021   Angioedema 01/01/2021   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 factor (HCC) 08/28/2020   High risk medication use 08/28/2020   Neck pain 08/19/2020   Dermatomycosis 07/01/2020   Encounter for screening fecal occult blood testing 02/26/2020   Encounter for gynecological examination with Papanicolaou smear of cervix 02/26/2020   Knee instability, left 09/06/2019   Hiatal hernia 07/17/2019  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   Back spasm 04/12/2018   Endometrial polyp 08/09/2017   Fibroids 08/09/2017   Vaginal dryness 07/18/2017   Dyslipidemia 06/27/2017   Leg edema 03/08/2017   GAD (generalized anxiety disorder) 06/01/2016   Metabolic syndrome X 07/08/2013   Erosive esophagitis 06/25/2013   GERD (gastroesophageal reflux disease) 03/22/2013   Left knee pain 03/22/2013   Snoring 08/05/2011   Urinary incontinence 08/05/2011   Vitamin D  deficiency 03/21/2011   Allergic rhinitis 09/20/2010   Back pain 03/17/2010   MORBID OBESITY 02/11/2010   Essential hypertension 02/11/2010    PCP: Antonetta Rollene BRAVO, MD    REFERRING PROVIDER: Margrette Taft BRAVO, MD   REFERRING DIAG: [phone] (ICD-10-CM) - Primary osteoarthritis of left knee  THERAPY DIAG:  Stiffness of left knee, not elsewhere classified  Status post total left knee replacement  Muscle weakness (generalized)  Rationale for Evaluation and Treatment: Rehabilitation  ONSET DATE: 05/11/23  SUBJECTIVE:   SUBJECTIVE STATEMENT:  Today: Patient states she has 6.5/10 left knee pain today. Patient has f/u with MD 07/28/23 with pending return to work date.    PERTINENT HISTORY: S/p L TKA on 05/11/23  PAIN:  Are you having pain? Yes: NPRS scale: 9-10/10 Pain location: L knee with bending  Pain description: sharp    PRECAUTIONS: Knee and Fall  RED FLAGS: None   WEIGHT BEARING RESTRICTIONS: No  FALLS:  Has patient fallen in last 6 months? 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  DIAGNOSTIC FINDINGS: N/A   PATIENT SURVEYS:  LEFS 42.5% (34/80)  COGNITION: Overall cognitive status: Within functional limits for tasks assessed     SENSATION: WFL  POSTURE: rounded shoulders and forward head  PALPATION: Unable to assess knee incision 2/2 pant style   LOWER EXTREMITY ROM:  Active ROM Right eval Left eval Left 06/01/23 Left 06/22/23 Left 07/12/23  Hip flexion       Hip extension       Hip abduction       Hip adduction       Hip internal rotation       Hip external rotation       Knee flexion  68 78 100 105  Knee extension  0  0 0  Ankle dorsiflexion        Ankle plantarflexion       Ankle inversion       Ankle eversion        (Blank rows = not tested)  LOWER EXTREMITY MMT:  MMT Right eval Left eval Left 07/12/23  Hip flexion [facility]    Hip extension     Hip abduction [facility]    Hip adduction     Hip internal rotation     Hip external rotation     Knee flexion WFL  4+  Knee extension WFL  5  Ankle dorsiflexion WFL    Ankle plantarflexion     Ankle inversion     Ankle eversion      (Blank rows = not tested)  -->  14 degree extension lag with SLR at eval  FUNCTIONAL TESTS:  5 times sit to stand: 36.75 seconds 2 minute walk test: to be completed visit #2   GAIT: Distance walked: 30' Assistive device utilized: Single point cane Level of assistance: Complete Independence Comments: decreased stance time on L, good heel strike noted    TODAY'S TREATMENT:                                                                                                                              DATE:   07/14/23 Seated  Nustep seat 9 x 5' dynamic warm up  Leg curl machine with 50# x10  Leg press machine with 30# 2x10 Standing  Leg curls with 5# ankle weight in // bars      07/12/23 Nustep seat 9 x 5' dynamic warm up Progress note LEFS 31/80 AROM and MMTs see above 2 MWT 408 ft no AD 5 times sit to stand test 14.68 sec Standing knee flexion drives on step x 2'  06/27/23 Bike seat 13 x 6' dynamic warm up/ROM Heel/toe raises x 20 12 box knee drives for flexion x 2' Squats to chair to chair 2 x 10 6 box step ups 2 x 10 Tandem stance 2 x 30 each Seated manual knee flexion x 10 reps Ended with bike x 2 full revolutions    06/24/23 Bike seat 13 x 6' dynamic warm up/ROM Heel/toe raises x 20 Knee drives for flexion x 2' on 8 box 6 box step ups 2 x 10 Squats with chair for target 2 x 10 GTB TKE x 20 Seated: 2# LAQ 2 x 10      06/22/23 Bike seat 13 x 5' dynamic warm up/ROM Standing: Heel/toe raises x 20 Knee drives 12 box  for flexion x 2' Mini squats to chair for target 2 x 10 4 step ups with 1 UE assist 2 x 10 4 lateral step ups with 2 UE assist 2 x 10 Tandem stance x 10 each Supine: AROM: left knee 0 to 100 SLR 2 x 5       06/17/23: Nustep, seat 9, 6' SAQ on foam roller with stretch strap assist,  2 x 10, 3 pause SAQ on foam roller into SLR with stretch strap assist, 2 x 10 Knee to chest on Physio ball with stretch strap assist, 3' Hamstring curl on Physio ball, 2 x 15 LAQ with 2# AW, 2x 15, 3 sec eccentric  Weight shifts into SLS on left 10 x 3  Weight shift anterior-posterior (knee over toes)  Mini squats to mat, 2 x 8   06/13/23: Supine: Quad set with heel slide on foam roller x 15 SAQ on foam roller with stretch strap assist,  2 x 10 SAQ on foam roller into SLR with stretch strap assist,  2 x 10 Seated knee flexion and extension with manual resistance 2 x 15 Contract-relax for knee flexion with manual distraction between  sets, 5' Weight shifts into SLS on left 5 x 5  Mini squats to mat, 2 x 8  Lt Knee ROM: 95 flex, 3 Ext (0 PROM)   PATIENT EDUCATION:  Education details: HEP, POC, goals  Person educated: Patient Education method: Medical Illustrator Education comprehension: verbalized understanding and returned demonstration  HOME EXERCISE PROGRAM: Encouraged continued performance of HEP given by HHPT after review of exercises provided Access Code: [facility] URL: [email or web address] Date: 06/06/2023 Prepared by: [facility]  Exercises - Supine Ankle Pumps  - 1 x daily - 7 x weekly - 3 sets - 15 reps - Supine Quadricep Sets  - 1 x daily - 7 x weekly - 3 sets - 15 reps - Supine Active Straight Leg Raise  - 1 x daily - 7 x weekly - 3 sets - 15 reps - Supine Heel Slides  - 1 x daily - 7 x weekly - 3 sets - 15 reps - Seated Long Arc Quad  - 1 x daily - 7 x weekly - 3 sets - 15 reps - Seated Knee Flexion Stretch  - 1 x daily - 7 x weekly - 3 sets -  15 reps - Sit to Stand with Armchair  - 2 x daily - 7 x weekly - 2 sets - 5 reps - Heel Raises with Counter Support  - 2 x daily - 7 x weekly - 2 sets - 10 reps - knee bends on the step using rails to hold onto  - 2 x daily - 7 x weekly - 2 sets - 10 reps  06/10/23: - Supine Short Arc Quad  - 2 x daily - 7 x weekly - 2 sets - 10 reps - 5 hold - Straight Leg Raise  - 2 x daily - 7 x weekly - 1 sets - 10 reps - Supine Bridge  - 1 x daily - 7 x weekly - 3 sets - 10 reps - Prone Quadriceps Stretch with Strap  - 2 x daily - 7 x weekly - 3 sets - 3 reps - 30 hold  ASSESSMENT:  CLINICAL IMPRESSION:    Today: Patient arrives to clinic with single point cane. PT initiated lower extremity strengthening using resistance machines. Patient able to tolerate machines well with no increase in pain levels. Patient requires verbal cues for proper form and therapeutic exercise progression.  Patient will continue to benefit from PT to return to prior level of function.   Progress note:patient able to walk in PT gym without cane.  She demonstrates good strength; slowly progressing knee flexion; good improvement with functional tests.  We will continue x 2 weeks to work on knee flexion and then discharge to I HEP.    OBJECTIVE IMPAIRMENTS: Abnormal gait, decreased activity tolerance, decreased endurance, decreased mobility, difficulty walking, decreased ROM, decreased strength, hypomobility, impaired flexibility, and pain.   ACTIVITY LIMITATIONS: squatting, sleeping, stairs, and locomotion level  PARTICIPATION LIMITATIONS: cleaning, laundry, driving, community activity, and yard work  PERSONAL FACTORS: Age, Past/current experiences, and Time since onset of injury/illness/exacerbation are also affecting patient's functional outcome.   REHAB POTENTIAL: Good  CLINICAL DECISION MAKING: Stable/uncomplicated  EVALUATION COMPLEXITY: Low   GOALS: Goals reviewed with patient? Yes  SHORT TERM GOALS: Target  date: 06/14/2023  Patient will be independent in HEP to improve strength/mobility for better functional independence with ADLs. Baseline: Goal status: met  LONG TERM GOALS: Target date: 07/05/2023   Patient will increase lower extremity functional scale to >60/80 to demonstrate  improved functional mobility and increased tolerance with ADLs.  Baseline: 11/20: 42.5%; 07/12/23 31/80 38.8%  Goal status: In progress  2.  Patient will increase BLE gross strength to 4+/5 as to improve functional strength for independent gait, increased standing tolerance and increased ADL ability. Baseline:  Goal status: met  3.  Patient will demonstrates SLR with 0 degree extension lag to demonstrate improved quad strength.  Baseline: 11/20: 14 degree extension lag ; 07/12/23 0 Goal status: met  4.   Patient (< 79 years old) will complete five times sit to stand test in < 10 seconds indicating an increased LE strength and improved balance. Baseline: 11/20: 36.75 seconds; 07/11/22 14.68 sec  Goal status: in progress   5.  Patient will demonstrate L knee AROM symmetrical to R knee for improve gait ability and overall mobility.  Baseline: 11/20: see above  Goal status: In progress   PLAN:  PT FREQUENCY:  1-3x/week  PT DURATION: 6 weeks  PLANNED INTERVENTIONS: 97164- PT Re-evaluation, 97110-Therapeutic exercises, 97530- Therapeutic activity, 97112- Neuromuscular re-education, 97535- Self Care, [id number]- Manual therapy, [phone]- Gait training, 97014- Electrical stimulation (unattended), [phone]- Electrical stimulation (manual), Patient/Family education, Stair training, Dry Needling, Joint mobilization, Joint manipulation, Scar mobilization, DME instructions, Cryotherapy, and Moist heat  PLAN FOR NEXT SESSION:  continue x 2 weeks to work on knee flexion; will then likely be ready for discharge  2:49 PM, 07/14/23 Deward Ming PT, DPT  [facility] physical therapy [place] [phone]

## 2023-07-18 ENCOUNTER — Ambulatory Visit (HOSPITAL_COMMUNITY): Payer: 59

## 2023-07-18 DIAGNOSIS — M25662 Stiffness of left knee, not elsewhere classified: Secondary | ICD-10-CM | POA: Diagnosis not present

## 2023-07-18 DIAGNOSIS — M6281 Muscle weakness (generalized): Secondary | ICD-10-CM

## 2023-07-18 DIAGNOSIS — Z96652 Presence of left artificial knee joint: Secondary | ICD-10-CM

## 2023-07-18 NOTE — Therapy (Signed)
 OUTPATIENT PHYSICAL THERAPY LOWER EXTREMITY TREATMENT   Patient Name: Julie Ryan MRN: [id number] DOB:27-Aug-1965, 58 y.o., female Today's Date: 07/18/2023  END OF SESSION:  PT End of Session - 07/18/23 1302     Visit Number 15    Number of Visits 20    Date for PT Re-Evaluation 08/11/23    Authorization Type [employer] state    Progress Note Due on Visit 10    PT Start Time 1302    PT Stop Time 1342    PT Time Calculation (min) 40 min    Activity Tolerance Patient tolerated treatment well    Behavior During Therapy [facility] for tasks assessed/performed               Past Medical History:  Diagnosis Date   Angio-edema    Anxiety    Arthritis    [employer]reesia 08/19/2020   Carpal tunnel syndrome of right wrist    Chronic knee pain    Depression    Endometrial polyp 08/09/2017   will get appt with Dr Jayne   Fibroids 08/09/2017   Has multiple small fibroids    GERD (gastroesophageal reflux disease)    Hypertension    Neuropathy    Obesity    Prediabetes    Seizures (HCC)    1 seizure as a chold; unknown etiology and has never been on meds   Sleep apnea    Past Surgical History:  Procedure Laterality Date   APPENDECTOMY     CARPAL TUNNEL RELEASE Right 08/01/2015   Procedure: RIGHT CARPAL TUNNEL RELEASE;  Surgeon: Taft FORBES Minerva, MD;  Location: [facility];  Service: Orthopedics;  Laterality: Right;   CERVICAL POLYPECTOMY  09/14/2017   Procedure: ENDOMETRIAL POLYPECTOMY;  Surgeon: Jayne Vonn DEL, MD;  Location: [facility];  Service: Gynecology;;   COLONOSCOPY N/A 06/15/2017   Procedure: COLONOSCOPY;  Surgeon: Golda Claudis PENNER, MD;  Location: [facility];  Service: Endoscopy;  Laterality: N/A;  830   ENDOMETRIAL ABLATION N/A 09/14/2017   Procedure: ENDOMETRIAL ABLATION( Minerva);  Surgeon: Jayne Vonn DEL, MD;  Location: [facility];  Service: Gynecology;  Laterality: N/A;   ESOPHAGOGASTRODUODENOSCOPY N/A 05/25/2013   Procedure: ESOPHAGOGASTRODUODENOSCOPY (EGD);  Surgeon: Claudis PENNER Golda, MD;  Location: [facility];  Service: Endoscopy;  Laterality: N/A;  220   GASTRIC BYPASS     HYSTEROSCOPY WITH D & C N/A 09/14/2017   Procedure: DILATATION AND CURETTAGE /HYSTEROSCOPY;  Surgeon: Jayne Vonn DEL, MD;  Location: [facility];  Service: Gynecology;  Laterality: N/A;   TOTAL KNEE ARTHROPLASTY Left 05/11/2023   Procedure: TOTAL KNEE ARTHROPLASTY;  Surgeon: Minerva Taft FORBES, MD;  Location: [facility];  Service: Orthopedics;  Laterality: Left;   TUBAL LIGATION     Patient Active Problem List   Diagnosis Date Noted   s/p left knee replacement 05/11/23 05/27/2023   Primary osteoarthritis of left knee 05/11/2023   Osteoarthritis of left knee 05/11/2023   COVID-19 04/04/2023   Low serum vitamin B12 12/22/2022   Shortness of breath 07/06/2022   Multifocal pneumonia 05/13/2022   Bilateral primary osteoarthritis of knee 06/02/2021   Angioedema 01/01/2021   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 factor (HCC) 08/28/2020   High risk medication use 08/28/2020   Neck pain 08/19/2020   Dermatomycosis 07/01/2020   Encounter for screening fecal occult blood testing 02/26/2020   Encounter for gynecological examination with Papanicolaou smear of cervix 02/26/2020   Knee instability, left 09/06/2019   Hiatal hernia 07/17/2019  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   Back spasm 04/12/2018   Endometrial polyp 08/09/2017   Fibroids 08/09/2017   Vaginal dryness 07/18/2017   Dyslipidemia 06/27/2017   Leg edema 03/08/2017   GAD (generalized anxiety disorder) 06/01/2016   Metabolic syndrome X 07/08/2013   Erosive esophagitis 06/25/2013   GERD (gastroesophageal reflux disease) 03/22/2013   Left knee pain 03/22/2013   Snoring 08/05/2011   Urinary incontinence 08/05/2011   Vitamin D  deficiency 03/21/2011   Allergic rhinitis 09/20/2010   Back pain 03/17/2010   MORBID OBESITY 02/11/2010   Essential hypertension 02/11/2010    PCP: Antonetta Rollene BRAVO,  MD   REFERRING PROVIDER: Margrette Taft BRAVO, MD   REFERRING DIAG: [phone] (ICD-10-CM) - Primary osteoarthritis of left knee  THERAPY DIAG:  Stiffness of left knee, not elsewhere classified  Status post total left knee replacement  Muscle weakness (generalized)  Rationale for Evaluation and Treatment: Rehabilitation  ONSET DATE: 05/11/23  SUBJECTIVE:   SUBJECTIVE STATEMENT:  No pain complaint today; walking some without the cane.     PERTINENT HISTORY: S/p L TKA on 05/11/23  PAIN:  Are you having pain? Yes: NPRS scale: 9-10/10 Pain location: L knee with bending  Pain description: sharp    PRECAUTIONS: Knee and Fall  RED FLAGS: None   WEIGHT BEARING RESTRICTIONS: No  FALLS:  Has patient fallen in last 6 months? 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  DIAGNOSTIC FINDINGS: N/A   PATIENT SURVEYS:  LEFS 42.5% (34/80)  COGNITION: Overall cognitive status: Within functional limits for tasks assessed     SENSATION: WFL  POSTURE: rounded shoulders and forward head  PALPATION: Unable to assess knee incision 2/2 pant style   LOWER EXTREMITY ROM:  Active ROM Right eval Left eval Left 06/01/23 Left 06/22/23 Left 07/12/23  Hip flexion       Hip extension       Hip abduction       Hip adduction       Hip internal rotation       Hip external rotation       Knee flexion  68 78 100 105  Knee extension  0  0 0  Ankle dorsiflexion       Ankle plantarflexion       Ankle inversion       Ankle  eversion        (Blank rows = not tested)  LOWER EXTREMITY MMT:  MMT Right eval Left eval Left 07/12/23  Hip flexion WFL    Hip extension     Hip abduction WFL    Hip adduction     Hip internal rotation     Hip external rotation     Knee flexion WFL  4+  Knee extension WFL  5  Ankle dorsiflexion WFL    Ankle plantarflexion     Ankle inversion     Ankle eversion      (Blank rows = not tested)  -->14 degree extension lag with SLR at eval  FUNCTIONAL TESTS:  5 times sit to stand: 36.75 seconds 2 minute walk test: to be completed visit #2   GAIT: Distance walked: 30' Assistive device utilized: Single point cane Level of assistance: Complete Independence Comments: decreased stance time on L, good heel strike noted    TODAY'S TREATMENT:                                                                                                                              DATE:  07/18/23 Nustep seat 9 x 5' dynamic warm up Standing:  Heel raises x 20 Slant board 5 x 20 Leg press 30# 2 x 10 Leg curl machine with 50# 2 x 10 Leg press machine with 30# 2x10 12 step left knee drives for flexion x 2' Squat to chair for target 2 x 10 SLS 3 x 15 left leg    07/14/23 Seated  Nustep seat 9 x 5' dynamic warm up  Leg curl machine with 50# x10  Leg press machine with 30# 2x10 Standing  Leg curls with 5# ankle weight in // bars      07/12/23 Nustep seat 9 x 5' dynamic warm up Progress note LEFS 31/80 AROM and MMTs see above 2 MWT 408 ft no AD 5 times sit to stand test 14.68 sec Standing knee flexion drives on step x 2'  06/27/23 Bike seat 13 x 6' dynamic warm up/ROM Heel/toe raises x 20 12 box knee drives for flexion x 2' Squats to chair to chair 2 x 10 6 box step ups 2 x 10 Tandem stance 2 x 30 each Seated manual knee flexion x 10 reps Ended with bike x 2 full revolutions    06/24/23 Bike seat 13 x 6' dynamic warm up/ROM Heel/toe raises x 20 Knee drives for flexion  x 2' on 8 box 6 box step ups 2 x 10 Squats with chair for target 2 x 10 GTB TKE x 20 Seated: 2# LAQ 2 x 10      06/22/23 Bike seat 13 x 5' dynamic warm up/ROM Standing: Heel/toe raises x 20 Knee drives 12 box for flexion x 2' Mini squats to chair for target 2 x 10 4 step ups with 1 UE assist 2 x 10 4 lateral step ups with 2 UE assist 2 x 10 Tandem stance x 10 each Supine: AROM: left knee 0 to 100 SLR 2 x 5       06/17/23: Nustep, seat 9, 6' SAQ on foam roller with stretch strap assist,  2 x 10, 3 pause SAQ on foam roller into SLR with stretch strap assist, 2 x 10 Knee to chest on Physio ball with stretch strap assist, 3' Hamstring curl on Physio ball, 2 x 15 LAQ with 2# AW, 2x 15, 3 sec eccentric  Weight shifts into SLS on left 10 x 3  Weight shift anterior-posterior (knee over toes)  Mini squats to mat, 2 x 8   06/13/23: Supine: Quad set  with heel slide on foam roller x 15 SAQ on foam roller with stretch strap assist,  2 x 10 SAQ on foam roller into SLR with stretch strap assist,  2 x 10 Seated knee flexion and extension with manual resistance 2 x 15 Contract-relax for knee flexion with manual distraction between sets, 5' Weight shifts into SLS on left 5 x 5  Mini squats to mat, 2 x 8  Lt Knee ROM: 95 flex, 3 Ext (0 PROM)   PATIENT EDUCATION:  Education details: HEP, POC, goals  Person educated: Patient Education method: Medical Illustrator Education comprehension: verbalized understanding and returned demonstration  HOME EXERCISE PROGRAM: Encouraged continued performance of HEP given by HHPT after review of exercises provided Access Code: [facility] URL: [email or web address] Date: 06/06/2023 Prepared by: [facility] - [facility]  Exercises - Supine Ankle Pumps  - 1 x daily - 7 x weekly - 3 sets - 15 reps - Supine Quadricep Sets  - 1 x daily - 7 x weekly - 3 sets - 15 reps - Supine Active Straight Leg Raise  - 1 x daily - 7 x  weekly - 3 sets - 15 reps - Supine Heel Slides  - 1 x daily - 7 x weekly - 3 sets - 15 reps - Seated Long Arc Quad  - 1 x daily - 7 x weekly - 3 sets - 15 reps - Seated Knee Flexion Stretch  - 1 x daily - 7 x weekly - 3 sets - 15 reps - Sit to Stand with Armchair  - 2 x daily - 7 x weekly - 2 sets - 5 reps - Heel Raises with Counter Support  - 2 x daily - 7 x weekly - 2 sets - 10 reps - knee bends on the step using rails to hold onto  - 2 x daily - 7 x weekly - 2 sets - 10 reps  06/10/23: - Supine Short Arc Quad  - 2 x daily - 7 x weekly - 2 sets - 10 reps - 5 hold - Straight Leg Raise  - 2 x daily - 7 x weekly - 1 sets - 10 reps - Supine Bridge  - 1 x daily - 7 x weekly - 3 sets - 10 reps - Prone Quadriceps Stretch with Strap  - 2 x daily - 7 x weekly - 3 sets - 3 reps - 30 hold  ASSESSMENT:  CLINICAL IMPRESSION:   Today's session started with Nustep for warm up. Continued with left lower extremity strengthening; increased reps of hamstring curls today without issue.  Able to walk in PT clinic without cane without issue; minimal antalgic gait noted. Added SLS to work on balance; no pain with SLS noted.  Patient will benefit from continued skilled therapy services to address deficits and promote return to optimal function.       Patient will continue to benefit from PT to return to prior level of function.   Progress note:patient able to walk in PT gym without cane.  She demonstrates good strength; slowly progressing knee flexion; good improvement with functional tests.  We will continue x 2 weeks to work on knee flexion and then discharge to I HEP.    OBJECTIVE IMPAIRMENTS: Abnormal gait, decreased activity tolerance, decreased endurance, decreased mobility, difficulty walking, decreased ROM, decreased strength, hypomobility, impaired flexibility, and pain.   ACTIVITY LIMITATIONS: squatting, sleeping, stairs, and locomotion level  PARTICIPATION LIMITATIONS: cleaning, laundry, driving,  community activity, and yard work  PERSONAL FACTORS: Age,  Past/current experiences, and Time since onset of injury/illness/exacerbation are also affecting patient's functional outcome.   REHAB POTENTIAL: Good  CLINICAL DECISION MAKING: Stable/uncomplicated  EVALUATION COMPLEXITY: Low   GOALS: Goals reviewed with patient? Yes  SHORT TERM GOALS: Target date: 06/14/2023  Patient will be independent in HEP to improve strength/mobility for better functional independence with ADLs. Baseline: Goal status: met  LONG TERM GOALS: Target date: 07/05/2023   Patient will increase lower extremity functional scale to >60/80 to demonstrate improved functional mobility and increased tolerance with ADLs.  Baseline: 11/20: 42.5%; 07/12/23 31/80 38.8%  Goal status: In progress  2.  Patient will increase BLE gross strength to 4+/5 as to improve functional strength for independent gait, increased standing tolerance and increased ADL ability. Baseline:  Goal status: met  3.  Patient will demonstrates SLR with 0 degree extension lag to demonstrate improved quad strength.  Baseline: 11/20: 14 degree extension lag ; 07/12/23 0 Goal status: met  4.   Patient (< 31 years old) will complete five times sit to stand test in < 10 seconds indicating an increased LE strength and improved balance. Baseline: 11/20: 36.75 seconds; 07/11/22 14.68 sec  Goal status: in progress   5.  Patient will demonstrate L knee AROM symmetrical to R knee for improve gait ability and overall mobility.  Baseline: 11/20: see above  Goal status: In progress   PLAN:  PT FREQUENCY:  1-3x/week  PT DURATION: 6 weeks  PLANNED INTERVENTIONS: 97164- PT Re-evaluation, 97110-Therapeutic exercises, 97530- Therapeutic activity, 97112- Neuromuscular re-education, 97535- Self Care, [id number]- Manual therapy, [phone]- Gait training, 97014- Electrical stimulation (unattended), [phone]- Electrical stimulation (manual), Patient/Family education, Stair  training, Dry Needling, Joint mobilization, Joint manipulation, Scar mobilization, DME instructions, Cryotherapy, and Moist heat  PLAN FOR NEXT SESSION:  continue x 2 weeks to work on knee flexion; will then likely be ready for discharge; sees MD 07/28/23  1:43 PM, 07/18/23 Makyi Ledo Small Jakylan Ron MPT  physical therapy [place] [phone] Ph:[phone]

## 2023-07-21 ENCOUNTER — Ambulatory Visit (HOSPITAL_COMMUNITY): Payer: 59

## 2023-07-21 DIAGNOSIS — Z96652 Presence of left artificial knee joint: Secondary | ICD-10-CM

## 2023-07-21 DIAGNOSIS — M25662 Stiffness of left knee, not elsewhere classified: Secondary | ICD-10-CM

## 2023-07-21 DIAGNOSIS — M6281 Muscle weakness (generalized): Secondary | ICD-10-CM

## 2023-07-21 NOTE — Therapy (Addendum)
OUTPATIENT PHYSICAL THERAPY LOWER EXTREMITY TREATMENT   Patient Name: Julie Ryan MRN: [id number] DOB:[birth year], 58 y.o., female Today's Date: 07/21/2023  END OF SESSION:  PT End of Session - 07/21/23 1358     Visit Number 16    Number of Visits 20    Date for PT Re-Evaluation 08/11/23    Authorization Type [employer] state    Progress Note Due on Visit 10    PT Start Time 1358   late arrival   PT Stop Time 1429    PT Time Calculation (min) 31 min    Activity Tolerance Patient tolerated treatment well    Behavior During Therapy [facility] for tasks assessed/performed               Past Medical History:  Diagnosis Date   Angio-edema    Anxiety    Arthritis    Phreesia 08/19/2020   Carpal tunnel syndrome of right wrist    Chronic knee pain    Depression    Endometrial polyp 08/09/2017   will get appt with Dr Despina Hidden   Fibroids 08/09/2017   Has multiple small fibroids    GERD (gastroesophageal reflux disease)    Hypertension    Neuropathy    Obesity    Prediabetes    Seizures (HCC)    1 seizure as a chold; unknown etiology and has never been on meds   Sleep apnea    Past Surgical History:  Procedure Laterality Date   APPENDECTOMY     CARPAL TUNNEL RELEASE Right 08/01/2015   Procedure: RIGHT CARPAL TUNNEL RELEASE;  Surgeon: Vickki Hearing, MD;  Location: [facility];  Service: Orthopedics;  Laterality: Right;   CERVICAL POLYPECTOMY  09/14/2017   Procedure: ENDOMETRIAL POLYPECTOMY;  Surgeon: Lazaro Arms, MD;  Location: [facility];  Service: Gynecology;;   COLONOSCOPY N/A 06/15/2017   Procedure: COLONOSCOPY;  Surgeon: Malissa Hippo, MD;  Location: [facility];  Service: Endoscopy;  Laterality: N/A;  830   ENDOMETRIAL ABLATION N/A 09/14/2017   Procedure: ENDOMETRIAL ABLATION( [place]);  Surgeon: Lazaro Arms, MD;  Location: [facility];  Service: Gynecology;  Laterality: N/A;   ESOPHAGOGASTRODUODENOSCOPY N/A 05/25/2013   Procedure: ESOPHAGOGASTRODUODENOSCOPY (EGD);  Surgeon:  Malissa Hippo, MD;  Location: [facility];  Service: Endoscopy;  Laterality: N/A;  220   GASTRIC BYPASS     HYSTEROSCOPY WITH D & C N/A 09/14/2017   Procedure: DILATATION AND CURETTAGE /HYSTEROSCOPY;  Surgeon: Lazaro Arms, MD;  Location: [facility];  Service: Gynecology;  Laterality: N/A;   TOTAL KNEE ARTHROPLASTY Left 05/11/2023   Procedure: TOTAL KNEE ARTHROPLASTY;  Surgeon: Vickki Hearing, MD;  Location: [facility];  Service: Orthopedics;  Laterality: Left;   TUBAL LIGATION     Patient Active Problem List   Diagnosis Date Noted   s/p left knee replacement 05/11/23 05/27/2023   Primary osteoarthritis of left knee 05/11/2023   Osteoarthritis of left knee 05/11/2023   COVID-19 04/04/2023   Low serum vitamin B12 12/22/2022   Shortness of breath 07/06/2022   Multifocal pneumonia 05/13/2022   Bilateral primary osteoarthritis of knee 06/02/2021   Angioedema 01/01/2021   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 factor (HCC) 08/28/2020   High risk medication use 08/28/2020   Neck pain 08/19/2020   Dermatomycosis 07/01/2020   Encounter for screening fecal occult blood testing 02/26/2020   Encounter for gynecological examination with Papanicolaou smear of cervix 02/26/2020   Knee instability, left 09/06/2019   Hiatal hernia  07/17/2019   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   Back spasm 04/12/2018   Endometrial polyp 08/09/2017   Fibroids 08/09/2017   Vaginal dryness 07/18/2017   Dyslipidemia 06/27/2017   Leg edema 03/08/2017   GAD (generalized anxiety disorder) 06/01/2016   Metabolic syndrome X 07/08/2013   Erosive esophagitis 06/25/2013   GERD (gastroesophageal reflux disease) 03/22/2013   Left knee pain 03/22/2013   Snoring 08/05/2011   Urinary incontinence 08/05/2011   Vitamin D deficiency 03/21/2011   Allergic rhinitis 09/20/2010   Back pain 03/17/2010   MORBID OBESITY 02/11/2010   Essential hypertension 02/11/2010    PCP: Kerri Perches, MD   REFERRING PROVIDER: Vickki Hearing, MD   REFERRING DIAG: [phone] (ICD-10-CM) - Primary osteoarthritis of left knee  THERAPY DIAG:  Stiffness of left knee, not elsewhere classified  Status post total left knee replacement  Muscle weakness (generalized)  Rationale for Evaluation and Treatment: Rehabilitation  ONSET DATE: 05/11/23  SUBJECTIVE:   SUBJECTIVE STATEMENT: Some pain left hip and thigh but no pain in the knee; late arrival.    PERTINENT HISTORY: S/p L TKA on 05/11/23  PAIN:  Are you having pain? Yes: NPRS scale: 9-10/10 Pain location: L knee with bending  Pain description: sharp    PRECAUTIONS: Knee and Fall  RED FLAGS: None   WEIGHT BEARING RESTRICTIONS: No  FALLS:  Has patient fallen in last 6 months? 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  DIAGNOSTIC FINDINGS: N/A   PATIENT SURVEYS:  LEFS 42.5% (34/80)  COGNITION: Overall cognitive status: Within functional limits for tasks assessed     SENSATION: WFL  POSTURE: rounded shoulders and forward head  PALPATION: Unable to assess knee incision 2/2 pant style   LOWER EXTREMITY ROM:  Active ROM Right eval Left eval Left 06/01/23 Left 06/22/23 Left 07/12/23  Hip flexion       Hip extension       Hip abduction       Hip adduction       Hip internal rotation       Hip external rotation       Knee flexion  68 78 100 105  Knee extension  0  0 0  Ankle dorsiflexion       Ankle plantarflexion       Ankle  inversion       Ankle eversion        (Blank rows = not tested)  LOWER EXTREMITY MMT:  MMT Right eval Left eval Left 07/12/23  Hip flexion WFL    Hip extension     Hip abduction WFL    Hip adduction     Hip internal rotation     Hip external rotation     Knee flexion WFL  4+  Knee extension WFL  5  Ankle dorsiflexion WFL    Ankle plantarflexion     Ankle inversion     Ankle eversion      (Blank rows = not tested)  -->14 degree extension lag with SLR  at eval  FUNCTIONAL TESTS:  5 times sit to stand: 36.75 seconds 2 minute walk test: to be completed visit #2   GAIT: Distance walked: 30' Assistive device utilized: Single point cane Level of assistance: Complete Independence Comments: decreased stance time on L, good heel strike noted    TODAY'S TREATMENT:                                                                                                                              DATE:  07/21/23 Nustep seat 9 x 5' dynamic warm up Leg curl machine with 50# 2 x 10 Leg press machine with 40# 2x10 Heel raises on step 2 x 10 Calf stretch against the wall 3 x 20" each Left knee drives for flexion on 2nd step x 2' Standing left knee flexion 106 (foot on step) Squat 2 x 10 Hamstring stretch 3 x 20" SLS 3 x 15" left leg only      07/18/23 Nustep seat 9 x 5' dynamic warm up Standing:  Heel raises x 20 Slant board 5 x 20" Leg curl machine with 50# 2 x 10 Leg press machine with 30# 2x10 12" step left knee drives for flexion x 2' Squat to chair for target 2 x 10 SLS 3 x 15" left leg    07/14/23 Seated  Nustep seat 9 x 5' dynamic warm up  Leg curl machine with 50# x10  Leg press machine with 30# 2x10 Standing  Leg curls with 5# ankle weight in // bars      07/12/23 Nustep seat 9 x 5' dynamic warm up Progress note LEFS 31/80 AROM and MMTs see above 2 MWT 408 ft no AD 5 times sit to stand test 14.68 sec Standing knee flexion drives on step x  2'  06/27/23 Bike seat 13 x 6' dynamic warm up/ROM Heel/toe raises x 20 12" box knee drives for flexion x 2' Squats to chair to chair 2 x 10 6" box step ups 2 x 10 Tandem stance 2 x 30" each Seated manual knee flexion x 10 reps Ended with bike x 2 full revolutions   PATIENT EDUCATION:  Education details: HEP, POC, goals  Person educated: Patient Education method: Medical illustrator Education comprehension: verbalized understanding and returned demonstration  HOME EXERCISE PROGRAM: Encouraged continued performance of HEP given by HHPT after review of exercises provided Access Code: [facility] URL: [email or web address] Date: 06/06/2023 Prepared by: [facility]  Exercises - Supine Ankle Pumps  - 1 x daily - 7 x weekly - 3 sets - 15 reps - Supine Quadricep Sets  - 1 x daily - 7 x weekly - 3 sets - 15 reps - Supine Active Straight Leg Raise  - 1 x daily - 7 x weekly - 3 sets - 15 reps - Supine Heel Slides  - 1 x daily - 7 x weekly - 3 sets - 15 reps - Seated Long Arc Quad  -  1 x daily - 7 x weekly - 3 sets - 15 reps - Seated Knee Flexion Stretch  - 1 x daily - 7 x weekly - 3 sets - 15 reps - Sit to Stand with Armchair  - 2 x daily - 7 x weekly - 2 sets - 5 reps - Heel Raises with Counter Support  - 2 x daily - 7 x weekly - 2 sets - 10 reps - knee bends on the step using rails to hold onto  - 2 x daily - 7 x weekly - 2 sets - 10 reps  06/10/23: - Supine Short Arc Quad  - 2 x daily - 7 x weekly - 2 sets - 10 reps - 5" hold - Straight Leg Raise  - 2 x daily - 7 x weekly - 1 sets - 10 reps - Supine Bridge  - 1 x daily - 7 x weekly - 3 sets - 10 reps - Prone Quadriceps Stretch with Strap  - 2 x daily - 7 x weekly - 3 sets - 3 reps - 30" hold  ASSESSMENT:  CLINICAL IMPRESSION:   Late arrival for today session; today's session started with Nustep for warm up. Arrives with cane but walks in clinic without it.  Increased leg press to 4 plates today without issue.  Calf raises on step today, added calf stretch on wall versus slant board to promote a way to stretch at home as patient is getting close to d/c.  Left knee AAROM 106 in standing.   Patient will benefit from continued skilled therapy services to address deficits and promote return to optimal function.       Patient will continue to benefit from PT to return to prior level of function.   Progress note:patient able to walk in PT gym without cane.  She demonstrates good strength; slowly progressing knee flexion; good improvement with functional tests.  We will continue x 2 weeks to work on knee flexion and then discharge to I HEP.    OBJECTIVE IMPAIRMENTS: Abnormal gait, decreased activity tolerance, decreased endurance, decreased mobility, difficulty walking, decreased ROM, decreased strength, hypomobility, impaired flexibility, and pain.   ACTIVITY LIMITATIONS: squatting, sleeping, stairs, and locomotion level  PARTICIPATION LIMITATIONS: cleaning, laundry, driving, community activity, and yard work  PERSONAL FACTORS: Age, Past/current experiences, and Time since onset of injury/illness/exacerbation are also affecting patient's functional outcome.   REHAB POTENTIAL: Good  CLINICAL DECISION MAKING: Stable/uncomplicated  EVALUATION COMPLEXITY: Low   GOALS: Goals reviewed with patient? Yes  SHORT TERM GOALS: Target date: 06/14/2023  Patient will be independent in HEP to improve strength/mobility for better functional independence with ADLs. Baseline: Goal status: met  LONG TERM GOALS: Target date: 07/05/2023   Patient will increase lower extremity functional scale to >60/80 to demonstrate improved functional mobility and increased tolerance with ADLs.  Baseline: 11/20: 42.5%; 07/12/23 31/80 38.8%  Goal status: In progress  2.  Patient will increase BLE gross strength to 4+/5 as to improve functional strength for independent gait, increased standing tolerance and increased ADL  ability. Baseline:  Goal status: met  3.  Patient will demonstrates SLR with 0 degree extension lag to demonstrate improved quad strength.  Baseline: 11/20: 14 degree extension lag ; 07/12/23 0 Goal status: met  4.   Patient (< 60 years old) will complete five times sit to stand test in < 10 seconds indicating an increased LE strength and improved balance. Baseline: 11/20: 36.75 seconds; 07/11/22 14.68 sec  Goal status: in  progress   5.  Patient will demonstrate L knee AROM symmetrical to R knee for improve gait ability and overall mobility.  Baseline: 11/20: see above  Goal status: In progress   PLAN:  PT FREQUENCY:  1-3x/week  PT DURATION: 6 weeks  PLANNED INTERVENTIONS: 97164- PT Re-evaluation, 97110-Therapeutic exercises, 97530- Therapeutic activity, 97112- Neuromuscular re-education, 97535- Self Care, 29562- Manual therapy, [phone]- Gait training, 97014- Electrical stimulation (unattended), [phone]- Electrical stimulation (manual), Patient/Family education, Stair training, Dry Needling, Joint mobilization, Joint manipulation, Scar mobilization, DME instructions, Cryotherapy, and Moist heat  PLAN FOR NEXT SESSION:  reassess next visit for likely d/c; sees MD 07/28/23  2:29 PM, 07/21/23 Lisette Mancebo Small Markian Glockner MPT [facility] [place] [phone] [phone]

## 2023-07-24 ENCOUNTER — Other Ambulatory Visit: Payer: Self-pay | Admitting: Orthopedic Surgery

## 2023-07-24 DIAGNOSIS — M1710 Unilateral primary osteoarthritis, unspecified knee: Secondary | ICD-10-CM

## 2023-07-25 ENCOUNTER — Encounter: Payer: BC Managed Care – PPO | Admitting: Orthopedic Surgery

## 2023-07-26 ENCOUNTER — Ambulatory Visit (HOSPITAL_COMMUNITY): Payer: 59

## 2023-07-26 DIAGNOSIS — Z96652 Presence of left artificial knee joint: Secondary | ICD-10-CM

## 2023-07-26 DIAGNOSIS — M25662 Stiffness of left knee, not elsewhere classified: Secondary | ICD-10-CM | POA: Diagnosis not present

## 2023-07-26 DIAGNOSIS — M6281 Muscle weakness (generalized): Secondary | ICD-10-CM

## 2023-07-26 NOTE — Therapy (Signed)
OUTPATIENT PHYSICAL THERAPY LOWER EXTREMITY DISCHARGE PHYSICAL THERAPY DISCHARGE SUMMARY  Visits from Start of Care: 17  Current functional level related to goals / functional outcomes: See below   Remaining deficits: See below   Education / Equipment: HEP   Patient agrees to discharge. Patient goals were partially met. Patient is being discharged due to maximized rehab potential.  Of note patient's right non surgical knee knee varus and pain is limiting patient. Good progress with s/p left knee    Patient Name: Julie Ryan MRN: [id number] DOB:1965/09/10, 58 y.o., female Today's Date: 07/26/2023  END OF SESSION:  PT End of Session - 07/26/23 0859     Visit Number 17    Number of Visits 20    Date for PT Re-Evaluation 08/11/23    Authorization Type BCBS state    Progress Note Due on Visit 10    PT Start Time 0857   late arrival   PT Stop Time 0926    PT Time Calculation (min) 29 min    Activity Tolerance Patient tolerated treatment well    Behavior During Therapy [facility] for tasks assessed/performed               Past Medical History:  Diagnosis Date   Angio-edema    Anxiety    Arthritis    Phreesia 08/19/2020   Carpal tunnel syndrome of right wrist    Chronic knee pain    Depression    Endometrial polyp 08/09/2017   will get appt with Dr Despina Hidden   Fibroids 08/09/2017   Has multiple small fibroids    GERD (gastroesophageal reflux disease)    Hypertension    Neuropathy    Obesity    Prediabetes    Seizures (HCC)    1 seizure as a chold; unknown etiology and has never been on meds   Sleep apnea    Past Surgical History:  Procedure Laterality Date   APPENDECTOMY     CARPAL TUNNEL RELEASE Right 08/01/2015   Procedure: RIGHT CARPAL TUNNEL RELEASE;  Surgeon: Vickki Hearing, MD;  Location: [facility] ORS;  Service: Orthopedics;  Laterality: Right;   CERVICAL POLYPECTOMY  09/14/2017   Procedure: ENDOMETRIAL POLYPECTOMY;  Surgeon: Lazaro Arms, MD;  Location: [facility]  ORS;  Service: Gynecology;;   COLONOSCOPY N/A 06/15/2017   Procedure: COLONOSCOPY;  Surgeon: Malissa Hippo, MD;  Location: [facility] ENDO SUITE;  Service: Endoscopy;  Laterality: N/A;  830   ENDOMETRIAL ABLATION N/A 09/14/2017   Procedure: ENDOMETRIAL ABLATION( [place]);  Surgeon: Lazaro Arms, MD;  Location: [facility] ORS;  Service: Gynecology;  Laterality: N/A;   ESOPHAGOGASTRODUODENOSCOPY N/A 05/25/2013   Procedure: ESOPHAGOGASTRODUODENOSCOPY (EGD);  Surgeon: Malissa Hippo, MD;  Location: [facility] SU[facility];  Service: Endoscopy;  Laterality: N/A;  220   GASTRIC BYPASS     HYSTEROSCOPY WITH D & C N/A 09/14/2017   Procedure: DILATATION AND CURETTAGE /HYSTEROSCOPY;  Surgeon: Lazaro Arms, MD;  Location: [facility] ORS;  Service: Gynecology;  Laterality: N/A;   TOTAL KNEE ARTHROPLASTY Left 05/11/2023   Procedure: TOTAL KNEE ARTHROPLASTY;  Surgeon: Vickki Hearing, MD;  Location: [facility] ORS;  Service: Orthopedics;  Laterality: Left;   TUBAL LIGATION     Patient Active Problem List   Diagnosis Date Noted   s/p left knee replacement 05/11/23 05/27/2023   Primary osteoarthritis of left knee 05/11/2023   Osteoarthritis of left knee 05/11/2023   COVID-19 04/04/2023   Low serum vitamin B12 12/22/2022   Shortness of breath 07/06/2022  Multifocal pneumonia 05/13/2022   Bilateral primary osteoarthritis of knee 06/02/2021   Angioedema 01/01/2021   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 factor (HCC) 08/28/2020   High risk medication use 08/28/2020   Neck pain 08/19/2020   Dermatomycosis 07/01/2020   Encounter for screening fecal occult blood testing 02/26/2020   Encounter for gynecological examination with Papanicolaou smear of cervix 02/26/2020   Knee instability, left 09/06/2019   Hiatal hernia 07/17/2019   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   Back spasm 04/12/2018   Endometrial polyp 08/09/2017   Fibroids 08/09/2017   Vaginal dryness 07/18/2017   Dyslipidemia 06/27/2017    Leg edema 03/08/2017   GAD (generalized anxiety disorder) 06/01/2016   Metabolic syndrome X 07/08/2013   Erosive esophagitis 06/25/2013   GERD (gastroesophageal reflux disease) 03/22/2013   Left knee pain 03/22/2013   Snoring 08/05/2011   Urinary incontinence 08/05/2011   Vitamin D deficiency 03/21/2011   Allergic rhinitis 09/20/2010   Back pain 03/17/2010   MORBID OBESITY 02/11/2010   Essential hypertension 02/11/2010    PCP: Kerri Perches, MD   REFERRING PROVIDER: Vickki Hearing, MD   REFERRING DIAG: [phone] (ICD-10-CM) - Primary osteoarthritis of left knee  THERAPY DIAG:  Stiffness of left knee, not elsewhere classified  Status post total left knee replacement  Muscle weakness (generalized)  Rationale for Evaluation and Treatment: Rehabilitation  ONSET DATE: 05/11/23  SUBJECTIVE:   SUBJECTIVE STATEMENT: Sees MD on Thursday 1/23; about "85%" better overall  PERTINENT HISTORY: S/p L TKA on 05/11/23  PAIN:  Are you having pain? Yes: NPRS scale: 9-10/10 Pain location: L knee with bending  Pain description: sharp    PRECAUTIONS: Knee and Fall  RED FLAGS: None   WEIGHT BEARING RESTRICTIONS: No  FALLS:  Has patient fallen in last 6 months? 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  DIAGNOSTIC FINDINGS: N/A   PATIENT SURVEYS:  LEFS 42.5% (34/80)  COGNITION: Overall cognitive status: Within functional limits for tasks assessed     SENSATION: WFL  POSTURE: rounded shoulders and  forward head  PALPATION: Unable to assess knee incision 2/2 pant style   LOWER EXTREMITY ROM:  Active ROM Right eval Left eval Left 06/01/23 Left 06/22/23 Left 07/12/23 Left 07/26/23  Hip flexion        Hip extension        Hip abduction        Hip adduction        Hip internal rotation        Hip external rotation        Knee flexion  68 78 100 105 110  Knee extension  0  0 0 0  Ankle dorsiflexion        Ankle plantarflexion        Ankle inversion        Ankle eversion         (Blank rows = not tested)  LOWER EXTREMITY MMT:  MMT Right eval Left eval Left 07/12/23 Left 07/26/23  Hip flexion  WFL   5  Hip extension      Hip abduction [facility]     Hip adduction      Hip internal rotation      Hip external rotation      Knee flexion WFL  4+ 5  Knee extension WFL  5 5  Ankle dorsiflexion WFL   5  Ankle plantarflexion      Ankle inversion      Ankle eversion       (Blank rows = not tested)  -->14 degree extension lag with SLR at eval  FUNCTIONAL TESTS:  5 times sit to stand: 36.75 seconds 2 minute walk test: to be completed visit #2   GAIT: Distance walked: 30' Assistive device utilized: Single point cane Level of assistance: Complete Independence Comments: decreased stance time on L, good heel strike noted    TODAY'S TREATMENT:                                                                                                                              DATE:  07/26/23 Nustep seat 9 x 5' dynamic warm up Progress note LEFS 32/80 AROM and MMTs see above 5 x sit to stand 11.24 using hands, 11.18 sec no hands 2 MWT 442 ft no AD Left knee AROM in supine 0-110  07/21/23 Nustep seat 9 x 5' dynamic warm up Leg curl machine with 50# 2 x 10 Leg press machine with 40# 2x10 Heel raises on step 2 x 10 Calf stretch against the wall 3 x 20" each Left knee drives for flexion on 2nd step x 2' Standing left knee flexion 106 (foot on step) Squat 2 x 10 Hamstring stretch 3 x  20" SLS 3 x 15" left leg only      07/18/23 Nustep seat 9 x 5' dynamic warm up Standing:  Heel raises x 20 Slant board 5 x 20" Leg curl machine with 50# 2 x 10 Leg press machine with 30# 2x10 12" step left knee drives for flexion x 2' Squat to chair for target 2 x 10 SLS 3 x 15" left leg    07/14/23 Seated  Nustep seat 9 x 5' dynamic warm up  Leg curl machine with 50# x10  Leg press machine with 30# 2x10 Standing  Leg curls with 5# ankle weight in // bars      07/12/23 Nustep seat 9 x 5' dynamic warm up Progress note LEFS 31/80 AROM and MMTs see above 2 MWT 408 ft no AD 5 times sit to stand test 14.68 sec Standing knee flexion drives on step x 2'  06/27/23 Bike seat 13 x 6' dynamic warm up/ROM Heel/toe raises x 20 12" box knee drives for flexion x 2' Squats to chair to chair 2 x 10 6" box step ups 2 x 10 Tandem stance 2 x 30" each Seated manual knee flexion x 10 reps Ended with bike x 2 full revolutions  PATIENT EDUCATION:  Education details: HEP, POC, goals  Person educated: Patient Education method: Medical illustrator Education comprehension: verbalized understanding and returned demonstration  HOME EXERCISE PROGRAM: Encouraged continued performance of HEP given by HHPT after review of exercises provided Access Code: [facility] URL: [email or web address] Date: 06/06/2023 Prepared by: [facility] - Rehab  Exercises - Supine Ankle Pumps  - 1 x daily - 7 x weekly - 3 sets - 15 reps - Supine Quadricep Sets  - 1 x daily - 7 x weekly - 3 sets - 15 reps - Supine Active Straight Leg Raise  - 1 x daily - 7 x weekly - 3 sets - 15 reps - Supine Heel Slides  - 1 x daily - 7 x weekly - 3 sets - 15 reps - Seated Long Arc Quad  - 1 x daily - 7 x weekly - 3 sets - 15 reps - Seated Knee Flexion Stretch  - 1 x daily - 7 x weekly - 3 sets - 15 reps - Sit to Stand with Armchair  - 2 x daily - 7 x weekly - 2 sets - 5 reps - Heel Raises with Counter  Support  - 2 x daily - 7 x weekly - 2 sets - 10 reps - knee bends on the step using rails to hold onto  - 2 x daily - 7 x weekly - 2 sets - 10 reps  06/10/23: - Supine Short Arc Quad  - 2 x daily - 7 x weekly - 2 sets - 10 reps - 5" hold - Straight Leg Raise  - 2 x daily - 7 x weekly - 1 sets - 10 reps - Supine Bridge  - 1 x daily - 7 x weekly - 3 sets - 10 reps - Prone Quadriceps Stretch with Strap  - 2 x daily - 7 x weekly - 3 sets - 3 reps - 30" hold  ASSESSMENT:  CLINICAL IMPRESSION:   Late arrival for today session; patient has met all set rehab goals except for LEFS score.  Of note patient with non surgical Right knee with slight extension lag.   Patient is agreeable to discharge today.     Patient will continue to benefit from PT to return to prior level of function.   Progress note:patient able to walk in PT gym without cane.  She demonstrates good strength; slowly progressing knee flexion; good improvement with functional tests.  We will continue x 2 weeks to work on knee flexion and then discharge to I HEP.    OBJECTIVE IMPAIRMENTS: Abnormal gait, decreased activity tolerance, decreased endurance, decreased mobility, difficulty walking, decreased ROM, decreased strength, hypomobility, impaired flexibility, and pain.   ACTIVITY LIMITATIONS: squatting, sleeping, stairs, and locomotion level  PARTICIPATION LIMITATIONS: cleaning, laundry, driving, community activity, and yard work  PERSONAL FACTORS: Age, Past/current experiences, and Time since onset of injury/illness/exacerbation are also affecting patient's functional outcome.   REHAB POTENTIAL: Good  CLINICAL DECISION MAKING: Stable/uncomplicated  EVALUATION COMPLEXITY: Low   GOALS: Goals reviewed with patient? Yes  SHORT TERM GOALS: Target date: 06/14/2023  Patient will be independent in HEP to improve strength/mobility for better functional independence with ADLs. Baseline: Goal status: met  LONG TERM GOALS:  Target date: 07/05/2023   Patient will increase lower extremity functional scale to >60/80 to demonstrate improved functional mobility and increased tolerance with ADLs.  Baseline: 11/20: 42.5%; 07/12/23 31/80 38.8% ; 32/80 40% Goal status: In progress  2.  Patient will increase BLE gross strength to  4+/5 as to improve functional strength for independent gait, increased standing tolerance and increased ADL ability. Baseline:  Goal status: met  3.  Patient will demonstrates SLR with 0 degree extension lag to demonstrate improved quad strength.  Baseline: 11/20: 14 degree extension lag ; 07/12/23 0 Goal status: met  4.   Patient (< 9 years old) will complete five times sit to stand test in < 10 seconds indicating an increased LE strength and improved balance. Baseline: 11/20: 36.75 seconds; 07/11/22 14.68 sec ; 07/26/23 11.19 sec Goal status: in progress   5.  Patient will demonstrate L knee AROM symmetrical to R knee for improve gait ability and overall mobility.  Baseline: 11/20: see above  Goal status: met   PLAN:  PT FREQUENCY:  1-3x/week  PT DURATION: 6 weeks  PLANNED INTERVENTIONS: 97164- PT Re-evaluation, 97110-Therapeutic exercises, 97530- Therapeutic activity, 97112- Neuromuscular re-education, 97535- Self Care, 40981- Manual therapy, [phone]- Gait training, 97014- Electrical stimulation (unattended), [phone]- Electrical stimulation (manual), Patient/Family education, Stair training, Dry Needling, Joint mobilization, Joint manipulation, Scar mobilization, DME instructions, Cryotherapy, and Moist heat  PLAN FOR NEXT SESSION:  d/c; sees MD 07/28/23  9:29 AM, 07/26/23 Momin Misko Small Nikolos Billig MPT [facility] [place] [phone] Ph:[phone]

## 2023-07-27 NOTE — Progress Notes (Unsigned)
   LMP 08/31/2017 (Approximate)   There is no height or weight on file to calculate BMI.  No chief complaint on file.   Encounter Diagnosis  Name Primary?   s/p left knee replacement 05/11/23 Yes    DOI/DOS/ Date: 05/21/23  {CHL AMB ORT SYMPTOMS POST TREATMENT:21798}

## 2023-07-28 ENCOUNTER — Ambulatory Visit: Payer: 59 | Admitting: Orthopedic Surgery

## 2023-07-28 ENCOUNTER — Encounter: Payer: Self-pay | Admitting: Orthopedic Surgery

## 2023-07-28 VITALS — BP 141/90 | HR 84

## 2023-07-28 DIAGNOSIS — Z96652 Presence of left artificial knee joint: Secondary | ICD-10-CM

## 2023-07-28 NOTE — Progress Notes (Signed)
Post op f/u   Encounter Diagnosis  Name Primary?   s/p left knee replacement 05/11/23 Yes   Encounter Diagnosis  Name Primary?   s/p left knee replacement 05/11/23 Yes    Doing well with left knee regained her range of motion walking without any assistive device uses the cane only on occasion ready to return to work on February 3 with my approval return in April May for scheduling of right total knee

## 2023-07-28 NOTE — Patient Instructions (Signed)
Work note needed return to work Feb 3rd

## 2023-08-18 ENCOUNTER — Encounter (HOSPITAL_BASED_OUTPATIENT_CLINIC_OR_DEPARTMENT_OTHER): Payer: Self-pay | Admitting: Internal Medicine

## 2023-08-18 DIAGNOSIS — R0683 Snoring: Secondary | ICD-10-CM

## 2023-09-18 ENCOUNTER — Telehealth: Admitting: Physician Assistant

## 2023-09-18 DIAGNOSIS — J069 Acute upper respiratory infection, unspecified: Secondary | ICD-10-CM

## 2023-09-18 NOTE — Progress Notes (Signed)
  Because of your symptoms and past medical history, I feel your condition warrants further evaluation and I recommend that you be seen in a face-to-face visit.   NOTE: There will be NO CHARGE for this E-Visit   If you are having a true medical emergency, please call 911.     For an urgent face to face visit, [facility] has multiple urgent care centers for your convenience.  Click the link below for the full list of locations and hours, walk-in wait times, appointment scheduling options and driving directions:  Urgent Care - [place], [place], [place], [place], [place], [place]  [place]     Your MyChart E-visit questionnaire answers were reviewed by a board certified advanced clinical practitioner to complete your personal care plan based on your specific symptoms.    Thank you for using e-Visits.

## 2023-09-19 ENCOUNTER — Telehealth: Payer: Self-pay | Admitting: Family Medicine

## 2023-09-19 NOTE — Telephone Encounter (Unsigned)
 Copied from CRM [id number]. Topic: Clinical - Medication Question >> Sep 19, 2023  4:01 PM Gildardo Pounds wrote: Reason for CRM: Patient is inquiring if she can get an antibiotic called in for sore throat and congestion for a possible sinus infection. Patient has an appointment scheduled for 10/06/2023. Callback number is [phone]

## 2023-09-19 NOTE — Telephone Encounter (Unsigned)
 Copied from CRM [id number]. Topic: Clinical - Medication Question >> Sep 19, 2023  3:59 PM Archie Patten S wrote: Reason for CRM: Patient is inquiring if she can get an antibiotic called in for sore throat and congestion for a possible sinus infection. Patient has an appointment scheduled for 10/06/2023. Callback number is [phone]

## 2023-09-19 NOTE — Telephone Encounter (Unsigned)
 Copied from CRM [id number]. Topic: Clinical - Medication Question >> Sep 19, 2023  3:56 PM Archie Patten S wrote: Reason for CRM: Patient is inquiring if she can get an antibiotic called in for sore throat and congestion for a possible sinus infection. Patient has an appointment scheduled for 10/06/2023. Callback number is [phone]

## 2023-09-20 NOTE — Telephone Encounter (Signed)
 Scheduled

## 2023-09-21 ENCOUNTER — Ambulatory Visit: Admitting: Family Medicine

## 2023-09-21 ENCOUNTER — Ambulatory Visit (HOSPITAL_COMMUNITY)
Admission: RE | Admit: 2023-09-21 | Discharge: 2023-09-21 | Disposition: A | Discharge status: Home / Self Care | Source: Ambulatory Visit | Attending: Family Medicine | Admitting: Family Medicine

## 2023-09-21 ENCOUNTER — Encounter: Payer: Self-pay | Admitting: Family Medicine

## 2023-09-21 VITALS — BP 133/75 | HR 67 | Temp 98.2°F | Resp 16 | Ht 66.0 in | Wt 208.1 lb

## 2023-09-21 DIAGNOSIS — J209 Acute bronchitis, unspecified: Secondary | ICD-10-CM | POA: Insufficient documentation

## 2023-09-21 DIAGNOSIS — J329 Chronic sinusitis, unspecified: Secondary | ICD-10-CM | POA: Insufficient documentation

## 2023-09-21 DIAGNOSIS — J0140 Acute pansinusitis, unspecified: Secondary | ICD-10-CM | POA: Diagnosis not present

## 2023-09-21 DIAGNOSIS — I10 Essential (primary) hypertension: Secondary | ICD-10-CM | POA: Diagnosis not present

## 2023-09-21 MED ORDER — CEFDINIR 300 MG PO CAPS: 300.0000 mg | ORAL_CAPSULE | Freq: Two times a day (BID) | ORAL | 0 refills | Status: DC

## 2023-09-21 MED ORDER — BENZONATATE 200 MG PO CAPS
200.0000 mg | ORAL_CAPSULE | Freq: Two times a day (BID) | ORAL | 0 refills | Status: DC | PRN
Start: 2023-09-21 — End: 2024-01-10

## 2023-09-21 MED ORDER — DOXYCYCLINE HYCLATE 100 MG PO TABS: 100.0000 mg | ORAL_TABLET | Freq: Two times a day (BID) | ORAL | 0 refills | Status: DC

## 2023-09-21 MED ORDER — FLUCONAZOLE 150 MG PO TABS: 150.0000 mg | ORAL_TABLET | Freq: Once | ORAL | 0 refills | Status: AC

## 2023-09-21 NOTE — Assessment & Plan Note (Addendum)
 Antibiotic course prescribed, omnicef and doxycycline

## 2023-09-21 NOTE — Assessment & Plan Note (Addendum)
 Cxr stat , decongestant , onmicef and doxycyline

## 2023-09-21 NOTE — Patient Instructions (Signed)
 Follow-up as before, call if you need me sooner.  Please go directly to the x-ray department and get a chest x-ray I will have the results sent to you before the end of the day.  You are treated for sinusitis and bronchitis.  10-day course of Omnicef and doxycycline are prescribed as antibiotics, decongestant Tessalon Perles are prescribed, fluconazole tablets is prescribed in the event that you get vaginal yeast infection.

## 2023-09-21 NOTE — Progress Notes (Signed)
 Julie Ryan     MRN: [id number]      DOB: 09-Aug-1965  Chief Complaint  Patient presents with   Sore Throat   Cough    X 1.5 weeks, producing greenish to greenish clear mucus, dizzy,  body aches, fatigue, sneezing. Has tried Tussin and Mucinex and benadryl OTC without much relief     HPI Julie Ryan is here 1 week ago had sore throat , has increase congestionin chest and head, chills no documented fever, fatigue especially with exertion ROS C/o  sinus pressure, nasal congestion,and  sore throat. Denies chest pains, palpitations and leg swelling Denies abdominal pain, nausea, vomiting,diarrhea or constipation.   Denies dysuria, frequency, hesitancy or incontinence. Denies joint pain, swelling and limitation in mobility. Denies headaches, seizures, numbness, or tingling. Denies depression, anxiety or insomnia. Denies skin break down or rash.   PE  BP 133/75   Pulse 67   Temp 98.2 F (36.8 C) (Oral)   Resp 16   Ht 5\' 6"  (1.676 m)   Wt 208 lb 1.9 oz (94.4 kg)   LMP 08/31/2017 (Approximate)   SpO2 92%   BMI 33.59 kg/m   Patient alert and oriented and in no cardiopulmonary distress.Ill appearing  HEENT: No facial asymmetry, EOMI,     Neck supple .maxillary sinus tenderness  Chest: decreased air entry inbases with crackles CVS: S1, S2 no murmurs, no S3.Regular rate.  ABD: Soft non tender.   Ext: No edema  MS: Adequate ROM spine, shoulders, hips and knees.  Skin: Intact, no ulcerations or rash noted.  Psych: Good eye contact, normal affect. Memory intact not anxious or depressed appearing.  CNS: CN 2-12 intact, power,  normal throughout.no focal deficits noted.   Assessment & Plan  Acute bronchitis Cxr stat , decongestant , onmicef and doxycyline   Sinusitis Antibiotic course prescribed, omnicef and doxycycline  Essential hypertension Controlled, no change in medication DASH diet and commitment to daily physical activity for a minimum of 30 minutes  discussed and encouraged, as a part of hypertension management. The importance of attaining a healthy weight is also discussed.     09/21/2023   10:30 AM 07/28/2023    2:37 PM 06/21/2023    2:46 PM 06/01/2023    1:06 PM 05/12/2023   12:55 PM 05/12/2023    3:41 AM 05/11/2023   11:20 PM  BP/Weight  Systolic BP 133 141 135 129 126 118 135  Diastolic BP 75 90 76 80 74 54 58  Wt. (Lbs) 208.12  213 212.08     BMI 33.59 kg/m2  34.38 kg/m2 34.23 kg/m2          MORBID OBESITY  Patient re-educated about  the importance of commitment to a  minimum of 150 minutes of exercise per week as able.  The importance of healthy food choices with portion control discussed, as well as eating regularly and within a 12 hour window most days. The need to choose "clean , green" food 50 to 75% of the time is discussed, as well as to make water the primary drink and set a goal of 64 ounces water daily.       09/21/2023   10:30 AM 06/21/2023    2:46 PM 06/01/2023    1:06 PM  Weight /BMI  Weight 208 lb 1.9 oz 213 lb 212 lb 1.3 oz  Height 5\' 6"  (1.676 m) 5\' 6"  (1.676 m) 5\' 6"  (1.676 m)  BMI 33.59 kg/m2 34.38 kg/m2 34.23 kg/m2  Improved

## 2023-09-26 NOTE — Assessment & Plan Note (Signed)
  Patient re-educated about  the importance of commitment to a  minimum of 150 minutes of exercise per week as able.  The importance of healthy food choices with portion control discussed, as well as eating regularly and within a 12 hour window most days. The need to choose "clean , green" food 50 to 75% of the time is discussed, as well as to make water the primary drink and set a goal of 64 ounces water daily.       09/21/2023   10:30 AM 06/21/2023    2:46 PM 06/01/2023    1:06 PM  Weight /BMI  Weight 208 lb 1.9 oz 213 lb 212 lb 1.3 oz  Height 5\' 6"  (1.676 m) 5\' 6"  (1.676 m) 5\' 6"  (1.676 m)  BMI 33.59 kg/m2 34.38 kg/m2 34.23 kg/m2    Improved

## 2023-09-26 NOTE — Assessment & Plan Note (Signed)
 Controlled, no change in medication DASH diet and commitment to daily physical activity for a minimum of 30 minutes discussed and encouraged, as a part of hypertension management. The importance of attaining a healthy weight is also discussed.     09/21/2023   10:30 AM 07/28/2023    2:37 PM 06/21/2023    2:46 PM 06/01/2023    1:06 PM 05/12/2023   12:55 PM 05/12/2023    3:41 AM 05/11/2023   11:20 PM  BP/Weight  Systolic BP 133 141 135 129 126 118 135  Diastolic BP 75 90 76 80 74 54 58  Wt. (Lbs) 208.12  213 212.08     BMI 33.59 kg/m2  34.38 kg/m2 34.23 kg/m2

## 2023-10-06 ENCOUNTER — Ambulatory Visit: Payer: Self-pay | Admitting: Family Medicine

## 2023-10-06 ENCOUNTER — Ambulatory Visit (HOSPITAL_COMMUNITY)
Admission: RE | Admit: 2023-10-06 | Discharge: 2023-10-06 | Disposition: A | Discharge status: Home / Self Care | Source: Ambulatory Visit | Attending: Family Medicine | Admitting: Family Medicine

## 2023-10-06 ENCOUNTER — Encounter: Payer: Self-pay | Admitting: Family Medicine

## 2023-10-06 VITALS — BP 120/80 | HR 63 | Resp 16 | Ht 66.0 in | Wt 210.0 lb

## 2023-10-06 DIAGNOSIS — M05742 Rheumatoid arthritis with rheumatoid factor of left hand without organ or systems involvement: Secondary | ICD-10-CM

## 2023-10-06 DIAGNOSIS — R1032 Left lower quadrant pain: Secondary | ICD-10-CM | POA: Insufficient documentation

## 2023-10-06 DIAGNOSIS — R5382 Chronic fatigue, unspecified: Secondary | ICD-10-CM

## 2023-10-06 DIAGNOSIS — I10 Essential (primary) hypertension: Secondary | ICD-10-CM | POA: Diagnosis not present

## 2023-10-06 DIAGNOSIS — E559 Vitamin D deficiency, unspecified: Secondary | ICD-10-CM

## 2023-10-06 DIAGNOSIS — M05741 Rheumatoid arthritis with rheumatoid factor of right hand without organ or systems involvement: Secondary | ICD-10-CM

## 2023-10-06 DIAGNOSIS — R5383 Other fatigue: Secondary | ICD-10-CM | POA: Insufficient documentation

## 2023-10-06 MED ORDER — METOPROLOL TARTRATE 25 MG PO TABS: ORAL_TABLET | ORAL | 1 refills | Status: DC

## 2023-10-06 NOTE — Assessment & Plan Note (Signed)
 3 week history, pain when she eats, xray stat then scan if not abn, then GI or gyne

## 2023-10-06 NOTE — Patient Instructions (Addendum)
 F/U  in 3 months, call if you need me sooner  Stop phentermine  Reduce metoprolol to one tablet daily, if scored take half twice daily, if not scored, take one once daily  Labs today  X ray of abdomen at hospital as soon  as you leave the office I will messGE THE RESULT, IF NO ABNORMALITY YOU WILL BE  referred for a scan   Thanks for choosing [facility], we consider it a privelige to serve you.

## 2023-10-06 NOTE — Assessment & Plan Note (Signed)
 3 month history Stop phentermine cBC, chem 7, TSH and  vit  D

## 2023-10-07 ENCOUNTER — Encounter: Payer: Self-pay | Admitting: Family Medicine

## 2023-10-07 LAB — BASIC METABOLIC PANEL WITH GFR
BUN/Creatinine Ratio: 17 (ref 9–23)
BUN: 15 mg/dL (ref 6–24)
CO2: 27 mmol/L (ref 20–29)
Calcium: 9.7 mg/dL (ref 8.7–10.2)
Chloride: 100 mmol/L (ref 96–106)
Creatinine, Ser: 0.86 mg/dL (ref 0.57–1.00)
Glucose: 82 mg/dL (ref 70–99)
Potassium: 5 mmol/L (ref 3.5–5.2)
Sodium: 138 mmol/L (ref 134–144)
eGFR: 78 mL/min/{1.73_m2} (ref >59)

## 2023-10-07 LAB — CBC WITH DIFFERENTIAL/PLATELET
Basophils Absolute: 0 10*3/uL (ref 0.0–0.2)
Basos: 0 %
EOS (ABSOLUTE): 0.4 10*3/uL (ref 0.0–0.4)
Eos: 6 %
Hematocrit: 39.3 % (ref 34.0–46.6)
Hemoglobin: 12.8 g/dL (ref 11.1–15.9)
Immature Grans (Abs): 0 10*3/uL (ref 0.0–0.1)
Immature Granulocytes: 0 %
Lymphocytes Absolute: 2.8 10*3/uL (ref 0.7–3.1)
Lymphs: 39 %
MCH: 30.5 pg (ref 26.6–33.0)
MCHC: 32.6 g/dL (ref 31.5–35.7)
MCV: 94 fL (ref 79–97)
Monocytes Absolute: 0.5 10*3/uL (ref 0.1–0.9)
Monocytes: 8 %
Neutrophils Absolute: 3.4 10*3/uL (ref 1.4–7.0)
Neutrophils: 47 %
Platelets: 379 10*3/uL (ref 150–450)
RBC: 4.19 x10E6/uL (ref 3.77–5.28)
RDW: 13.1 % (ref 11.7–15.4)
WBC: 7.2 10*3/uL (ref 3.4–10.8)

## 2023-10-07 LAB — TSH: TSH: 1.35 u[IU]/mL (ref 0.450–4.500)

## 2023-10-07 LAB — VITAMIN D 25 HYDROXY (VIT D DEFICIENCY, FRACTURES): Vit D, 25-Hydroxy: 46.3 ng/mL (ref 30.0–100.0)

## 2023-10-09 ENCOUNTER — Encounter: Payer: Self-pay | Admitting: Family Medicine

## 2023-10-09 ENCOUNTER — Encounter (HOSPITAL_BASED_OUTPATIENT_CLINIC_OR_DEPARTMENT_OTHER): Payer: 59 | Admitting: Internal Medicine

## 2023-10-09 NOTE — Assessment & Plan Note (Signed)
  Patient re-educated about  the importance of commitment to a  minimum of 150 minutes of exercise per week as able.  The importance of healthy food choices with portion control discussed, as well as eating regularly and within a 12 hour window most days. The need to choose "clean , green" food 50 to 75% of the time is discussed, as well as to make water the primary drink and set a goal of 64 ounces water daily.       10/06/2023    8:56 AM 09/21/2023   10:30 AM 06/21/2023    2:46 PM  Weight /BMI  Weight 210 lb 0.6 oz 208 lb 1.9 oz 213 lb  Height 5\' 6"  (1.676 m) 5\' 6"  (1.676 m) 5\' 6"  (1.676 m)  BMI 33.9 kg/m2 33.59 kg/m2 34.38 kg/m2

## 2023-10-09 NOTE — Assessment & Plan Note (Addendum)
Level normal

## 2023-10-09 NOTE — Progress Notes (Signed)
 [facility]     MRN: [id number]      DOB: 10-Aug-1965  Chief Complaint  Patient presents with   Abdominal Pain    Lower left abdominal pain after eating x1 week. Denies vomiting and diarrhea.   Dizziness    Complains of dizziness and fatigue x1 month. Increases when bending over or walking.     HPI Julie Ryan is here for follow up and re-evaluation of chronic medical conditions, medication management and review of any available recent lab and radiology data.  The PT denies any adverse reactions to current medications since the last visit.  New concerns are as above  Takes phentermine for 4 to 5 days/ week, expriencing fatiue likely from drug wihdrawal, no significant weight loss so med to be stopped   ROS Denies recent fever or chills. Denies sinus pressure, nasal congestion, ear pain or sore throat. Denies chest congestion, productive cough or wheezing. Denies chest pains, palpitations and leg swelling Denies dysuria, frequency, hesitancy or incontinence. Chronic  joint pain, swelling and limitation in mobility. Denies headaches, seizures, numbness, or tingling. Denies depression, anxiety or insomnia. Denies skin break down or rash.   PE BP 120/80   Pulse 63   Resp 16   Ht 5\' 6"  (1.676 m)   Wt 210 lb 0.6 oz (95.3 kg)   LMP 08/31/2017 (Approximate)   SpO2 95%   BMI 33.90 kg/m    Patient alert and oriented and in no cardiopulmonary distress.  HEENT: No facial asymmetry, EOMI,     Neck supple .No carotid bruit heard  Chest: Clear to auscultation bilaterally.  CVS: S1, S2 no murmurs, no S3.Regular rate.  ABD: Soft non tender. LLQ tender to deep and superficial palpation, no guarding or rebound, npo palpable mass  Ext: No edema  MS: Adequate ROM spine, shoulders, hips and knees.  Skin: Intact, no ulcerations or rash noted.  Psych: Good eye contact, normal affect. Memory intact not anxious or depressed appearing.  CNS: CN 2-12 intact, power,  normal  throughout.no focal deficits noted.   Assessment & Plan  LLQ pain 3 week history, pain when she eats, xray stat then scan if not abn, then GI or gyne  Fatigue 3 month history Stop phentermine cBC, chem 7, TSH and  vit  D  Essential hypertension Possible over corrction with light headedness, reduce propranolol dose DASH diet and commitment to daily physical activity for a minimum of 30 minutes discussed and encouraged, as a part of hypertension management. The importance of attaining a healthy weight is also discussed.     10/09/2023    8:57 AM 10/06/2023    9:35 AM 10/06/2023    8:56 AM 09/21/2023   10:30 AM 07/28/2023    2:37 PM 06/21/2023    2:46 PM 06/01/2023    1:06 PM  BP/Weight  Systolic BP 120 118 119 133 141 135 129  Diastolic BP 80 20 76 75 90 76 80  Wt. (Lbs)   210.04 208.12  213 212.08  BMI   33.9 kg/m2 33.59 kg/m2  34.38 kg/m2 34.23 kg/m2       MORBID OBESITY  Patient re-educated about  the importance of commitment to a  minimum of 150 minutes of exercise per week as able.  The importance of healthy food choices with portion control discussed, as well as eating regularly and within a 12 hour window most days. The need to choose "clean , green" food 50 to 75% of the time is discussed,  as well as to make water the primary drink and set a goal of 64 ounces water daily.       10/06/2023    8:56 AM 09/21/2023   10:30 AM 06/21/2023    2:46 PM  Weight /BMI  Weight 210 lb 0.6 oz 208 lb 1.9 oz 213 lb  Height 5\' 6"  (1.676 m) 5\' 6"  (1.676 m) 5\' 6"  (1.676 m)  BMI 33.9 kg/m2 33.59 kg/m2 34.38 kg/m2      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 factor (HCC) Treated by rheumatology and stble  Vitamin D deficiency Level normal

## 2023-10-09 NOTE — Assessment & Plan Note (Signed)
 Possible over corrction with light headedness, reduce propranolol dose DASH diet and commitment to daily physical activity for a minimum of 30 minutes discussed and encouraged, as a part of hypertension management. The importance of attaining a healthy weight is also discussed.     10/09/2023    8:57 AM 10/06/2023    9:35 AM 10/06/2023    8:56 AM 09/21/2023   10:30 AM 07/28/2023    2:37 PM 06/21/2023    2:46 PM 06/01/2023    1:06 PM  BP/Weight  Systolic BP 120 118 119 133 141 135 129  Diastolic BP 80 20 76 75 90 76 80  Wt. (Lbs)   210.04 208.12  213 212.08  BMI   33.9 kg/m2 33.59 kg/m2  34.38 kg/m2 34.23 kg/m2

## 2023-10-09 NOTE — Assessment & Plan Note (Signed)
 Treated by rheumatology and stble

## 2023-10-31 ENCOUNTER — Ambulatory Visit: Payer: 59 | Admitting: Orthopedic Surgery

## 2023-10-31 DIAGNOSIS — Z96652 Presence of left artificial knee joint: Secondary | ICD-10-CM

## 2023-10-31 DIAGNOSIS — M1712 Unilateral primary osteoarthritis, left knee: Secondary | ICD-10-CM

## 2023-10-31 NOTE — Progress Notes (Signed)
 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

## 2023-10-31 NOTE — Progress Notes (Signed)
   LMP 08/31/2017 (Approximate)   There is no height or weight on file to calculate BMI.  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  DOI/DOS/ Date: 05/11/23  Improved

## 2023-11-04 ENCOUNTER — Other Ambulatory Visit: Payer: Self-pay | Admitting: Family Medicine

## 2023-11-13 ENCOUNTER — Other Ambulatory Visit: Payer: Self-pay | Admitting: Orthopedic Surgery

## 2023-11-13 DIAGNOSIS — M1710 Unilateral primary osteoarthritis, unspecified knee: Secondary | ICD-10-CM

## 2023-11-14 ENCOUNTER — Telehealth: Payer: Self-pay

## 2023-11-14 ENCOUNTER — Other Ambulatory Visit: Payer: Self-pay | Admitting: Family Medicine

## 2023-11-14 MED ORDER — VITAMIN D (ERGOCALCIFEROL) 1.25 MG (50000 UNIT) PO CAPS: 50000.0000 [IU] | ORAL_CAPSULE | ORAL | 1 refills | Status: DC

## 2023-11-14 NOTE — Progress Notes (Signed)
 Weekly vit D refilled x 6 months

## 2023-11-14 NOTE — Telephone Encounter (Signed)
 Pt is needing refill of Vit D2 50,000 sent to [facility] pharmacy. Please refill if appropriate.

## 2023-12-15 ENCOUNTER — Other Ambulatory Visit: Payer: Self-pay | Admitting: Family Medicine

## 2023-12-23 ENCOUNTER — Other Ambulatory Visit: Payer: Self-pay | Admitting: Family Medicine

## 2024-01-03 ENCOUNTER — Other Ambulatory Visit: Payer: Self-pay | Admitting: Family Medicine

## 2024-01-03 NOTE — Telephone Encounter (Unsigned)
 Copied from CRM [id number]. Topic: Clinical - Medication Refill >> Jan 03, 2024  3:33 PM Wess S wrote: Medication: gabapentin  (NEURONTIN ) 300 MG capsule   Has the patient contacted their pharmacy? Yes (Agent: If no, request that the patient contact the pharmacy for the refill. If patient does not wish to contact the pharmacy document the reason why and proceed with request.) (Agent: If yes, when and what did the pharmacy advise?)  This is the patient's preferred pharmacy:  [id number] - [place], [place] - [address] AT SEC [address] [place] [address] Phone: [phone] Fax: [phone]  Is this the correct pharmacy for this prescription? Yes If no, delete pharmacy and type the correct one.   Has the prescription been filled recently? Yes  Is the patient out of the medication? Yes  Has the patient been seen for an appointment in the last year OR does the patient have an upcoming appointment? Yes  Can we respond through MyChart? Yes  Agent: Please be advised that Rx refills may take up to 3 business days. We ask that you follow-up with your pharmacy.

## 2024-01-04 MED ORDER — GABAPENTIN 300 MG PO CAPS: ORAL_CAPSULE | ORAL | 2 refills | Status: DC

## 2024-01-10 ENCOUNTER — Telehealth: Payer: Self-pay | Admitting: Pharmacy Technician

## 2024-01-10 ENCOUNTER — Other Ambulatory Visit (HOSPITAL_COMMUNITY): Payer: Self-pay

## 2024-01-10 ENCOUNTER — Other Ambulatory Visit (HOSPITAL_COMMUNITY): Payer: Self-pay | Admitting: Family Medicine

## 2024-01-10 ENCOUNTER — Ambulatory Visit: Payer: Self-pay | Admitting: Family Medicine

## 2024-01-10 ENCOUNTER — Encounter: Payer: Self-pay | Admitting: Family Medicine

## 2024-01-10 VITALS — BP 112/72 | HR 76 | Resp 18 | Ht 66.0 in | Wt 217.0 lb

## 2024-01-10 DIAGNOSIS — I10 Essential (primary) hypertension: Secondary | ICD-10-CM

## 2024-01-10 DIAGNOSIS — M05741 Rheumatoid arthritis with rheumatoid factor of right hand without organ or systems involvement: Secondary | ICD-10-CM

## 2024-01-10 DIAGNOSIS — F321 Major depressive disorder, single episode, moderate: Secondary | ICD-10-CM | POA: Insufficient documentation

## 2024-01-10 DIAGNOSIS — Z1231 Encounter for screening mammogram for malignant neoplasm of breast: Secondary | ICD-10-CM

## 2024-01-10 DIAGNOSIS — M05742 Rheumatoid arthritis with rheumatoid factor of left hand without organ or systems involvement: Secondary | ICD-10-CM

## 2024-01-10 MED ORDER — PHENTERMINE HCL 37.5 MG PO TABS: ORAL_TABLET | ORAL | 1 refills | Status: DC

## 2024-01-10 MED ORDER — METOPROLOL SUCCINATE ER 25 MG PO TB24: 25.0000 mg | ORAL_TABLET | Freq: Every day | ORAL | 3 refills | Status: AC

## 2024-01-10 MED ORDER — GABAPENTIN 300 MG PO CAPS: ORAL_CAPSULE | ORAL | 2 refills | Status: DC

## 2024-01-10 NOTE — Assessment & Plan Note (Signed)
 Intolerant of meds in the past, not suicidal or homicidal, refer to therapy

## 2024-01-10 NOTE — Progress Notes (Signed)
 Julie Ryan     MRN: [id number]      DOB: Feb 28, 1966  Chief Complaint  Patient presents with   Hypertension    3 month follow up     HPI Julie Ryan is here for follow up and re-evaluation of chronic medical conditions, medication management and review of any available recent lab and radiology data.  Preventive health is updated, specifically  Cancer screening and Immunization.   Questions or concerns regarding consultations or procedures which the PT has had in the interim are  addressed. The PT denies any adverse reactions to current medications since the last visit.  C/o weight gain/ lack of weight loss and depression, not suicidal or homicidal, interested in therapy Patient and/or legal guardian verbally consented to [facility] services about presenting concerns and psychiatric consultation as appropriate.  The services will be billed as appropriate for the patient    ROS Denies recent fever or chills. Denies sinus pressure, nasal congestion, ear pain or sore throat. Denies chest congestion, productive cough or wheezing. Denies chest pains, palpitations and leg swelling Denies abdominal pain, nausea, vomiting,diarrhea or constipation.   Denies dysuria, frequency, hesitancy or incontinence. C/o bilateral kbnee pain and stiffness. Denies headaches, seizures, numbness, or tingling. . Denies skin break down or rash.   PE  BP 112/72   Pulse 76   Resp 18   Ht 5' 6 (1.676 m)   Wt 217 lb 0.6 oz (98.4 kg)   LMP 08/31/2017 (Approximate)   SpO2 96%   BMI 35.03 kg/m   Patient alert and oriented and in no cardiopulmonary distress.  HEENT: No facial asymmetry, EOMI,     Neck supple .  Chest: Clear to auscultation bilaterally.  CVS: S1, S2 no murmurs, no S3.Regular rate.  ABD: Soft non tender.   Ext: No edema  MS: Adequate ROM spine, shoulders, hips and reduced in  knees.  Skin: Intact, no ulcerations or rash noted.  Psych: Good eye  contact, normal affect. Memory intact not anxious or depressed appearing.  CNS: CN 2-12 intact, power,  normal throughout.no focal deficits noted.   Assessment & Plan  Depression, major, single episode, moderate (HCC) Intolerant of meds in the past, not suicidal or homicidal, refer to therapy  Essential hypertension Controlled, no change in medication DASH diet and commitment to daily physical activity for a minimum of 30 minutes discussed and encouraged, as a part of hypertension management. The importance of attaining a healthy weight is also discussed.     01/10/2024    8:47 AM 10/09/2023    8:57 AM 10/06/2023    9:35 AM 10/06/2023    8:56 AM 09/21/2023   10:30 AM 07/28/2023    2:37 PM 06/21/2023    2:46 PM  BP/Weight  Systolic BP 112 120 118 119 133 141 135  Diastolic BP 72 80 20 76 75 90 76  Wt. (Lbs) 217.04   210.04 208.12  213  BMI 35.03 kg/m2   33.9 kg/m2 33.59 kg/m2  34.38 kg/m2     Change metoprolol  to ER as she takes med once dailuy  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 factor (HCC) Has been off treatment for ov er 3 months as associated with increased pain  MORBID OBESITY  Patient re-educated about  the importance of commitment to a  minimum of 150 minutes of exercise per week as able.  The importance of healthy food choices with portion control discussed, as well as eating regularly and  within a 12 hour window most days. The need to choose clean , green food 50 to 75% of the time is discussed, as well as to make water  the primary drink and set a goal of 64 ounces water  daily.       01/10/2024    8:47 AM 10/06/2023    8:56 AM 09/21/2023   10:30 AM  Weight /BMI  Weight 217 lb 0.6 oz 210 lb 0.6 oz 208 lb 1.9 oz  Height 5' 6 (1.676 m) 5' 6 (1.676 m) 5' 6 (1.676 m)  BMI 35.03 kg/m2 33.9 kg/m2 33.59 kg/m2    Resume half phentermine  daily

## 2024-01-10 NOTE — Patient Instructions (Signed)
 Follow-up in 4 months, call if you need me sooner.  Please schedule mammogram at checkout.  New to help with weight management is phentermine  take one half of a tablet once daily.  Remember to change food choices to vegetables and fruit beans and healthy  protein and try to eliminate white foods and sugary foods from your diet.  Please commit to  exercise 30 minutes at least 5 days/week.  You are referred to therapy for management of depression, I believe that this will help significantly.  Changing your metoprolol  to a long-acting tablet which you will take once daily the dose being the same.  Thanks for choosing [facility], we consider it a privelige to serve you.

## 2024-01-10 NOTE — Assessment & Plan Note (Signed)
 Controlled, no change in medication DASH diet and commitment to daily physical activity for a minimum of 30 minutes discussed and encouraged, as a part of hypertension management. The importance of attaining a healthy weight is also discussed.     01/10/2024    8:47 AM 10/09/2023    8:57 AM 10/06/2023    9:35 AM 10/06/2023    8:56 AM 09/21/2023   10:30 AM 07/28/2023    2:37 PM 06/21/2023    2:46 PM  BP/Weight  Systolic BP 112 120 118 119 133 141 135  Diastolic BP 72 80 20 76 75 90 76  Wt. (Lbs) 217.04   210.04 208.12  213  BMI 35.03 kg/m2   33.9 kg/m2 33.59 kg/m2  34.38 kg/m2     Change metoprolol  to ER as she takes med once dailuy

## 2024-01-10 NOTE — Assessment & Plan Note (Signed)
 Has been off treatment for ov er 3 months as associated with increased pain

## 2024-01-10 NOTE — Assessment & Plan Note (Signed)
  Patient re-educated about  the importance of commitment to a  minimum of 150 minutes of exercise per week as able.  The importance of healthy food choices with portion control discussed, as well as eating regularly and within a 12 hour window most days. The need to choose clean , green food 50 to 75% of the time is discussed, as well as to make water  the primary drink and set a goal of 64 ounces water  daily.       01/10/2024    8:47 AM 10/06/2023    8:56 AM 09/21/2023   10:30 AM  Weight /BMI  Weight 217 lb 0.6 oz 210 lb 0.6 oz 208 lb 1.9 oz  Height 5' 6 (1.676 m) 5' 6 (1.676 m) 5' 6 (1.676 m)  BMI 35.03 kg/m2 33.9 kg/m2 33.59 kg/m2    Resume half phentermine  daily

## 2024-01-11 ENCOUNTER — Other Ambulatory Visit (HOSPITAL_COMMUNITY): Payer: Self-pay

## 2024-01-11 NOTE — Telephone Encounter (Signed)
 Pharmacy Patient Advocate Encounter  Received notification from [facility] that Prior Authorization for Phentermine  HCl 37.5MG  tablets  has been APPROVED from 01/10/2024 to 04/11/2024. Unable to obtain price due to refill too soon rejection, last fill date 01/11/2024 next available fill date07/03/2025.   PA #/Case ID/Reference #: [id number]  Plan allows maxium of 90 days supply in 365 days.

## 2024-01-11 NOTE — Telephone Encounter (Signed)
 Pharmacy Patient Advocate Encounter   Received notification from CoverMyMeds that prior authorization for Phentermine  37.5mg  tablets is required/requested.   Insurance verification completed.   The patient is insured through [facility] .   Per test claim: PA required; PA submitted to above mentioned insurance via CoverMyMeds Key/confirmation #/EOC [id number] Status is pending

## 2024-01-20 ENCOUNTER — Ambulatory Visit (INDEPENDENT_AMBULATORY_CARE_PROVIDER_SITE_OTHER): Payer: Self-pay | Admitting: Professional Counselor

## 2024-01-20 DIAGNOSIS — F321 Major depressive disorder, single episode, moderate: Secondary | ICD-10-CM | POA: Diagnosis not present

## 2024-01-20 DIAGNOSIS — F331 Major depressive disorder, recurrent, moderate: Secondary | ICD-10-CM

## 2024-01-20 NOTE — Patient Instructions (Signed)
 If your symptoms worsen or you have thoughts of suicide/homicide, PLEASE SEEK IMMEDIATE MEDICAL ATTENTION.  You may always call:   National Suicide Hotline: 988 or (913)195-1115 [place] Crisis Line: 343-675-8477 Crisis Recovery in [place]: [phone]     These are available 24 hours a day, 7 days a week.

## 2024-01-20 NOTE — BH Specialist Note (Addendum)
 Collaborative Care Initial Assessment  Session Start time: 1:00 pm   Session End time: 2:00 pm  Total time in minutes: 60 min   Type of Contact:  Face to Face Patient consent obtained:   Types of Service: Collaborative care  Summary  Patient is a 58 yo female being referred to collaborative care by her pcp for anxiety and depression. Patient was engaged and cooperative during session.   Reason for referral in patient/family's own words:  I stay depressed  Patient's goal for today's visit: I need a change  History of Present illness:   The patient is a 58 year old female presenting for an initial collaborative care assessment with a primary complaint of persistent depressive symptoms. She reports experiencing low mood, lack of motivation, and significant fatigue. Outside of work, she states that she spends most of her time sitting at home with little energy or desire to engage in activities.  Her symptoms appear to be compounded by chronic knee pain and a history of grief and loss. She reports undergoing knee surgery, which has limited her physical activity and negatively impacted her ability to manage her weight--something that has further contributed to her emotional distress. Additionally, she has experienced the loss of several close family members over time, including her mother, father, son, sister, and brother. This cumulative grief appears to play a significant role in her current emotional state.  She currently resides with her sister and adult daughter, though she describes the home environment as stressful. She is not married and has no history of psychiatric hospitalization or prior mental health treatment. She denies any history of trauma, substance use, or suicidal ideation, and she continues to deny any current suicidality.  The patient expresses a strong desire for change, stating that she feels stuck in the same routine and is ready to take steps toward improving her  mental and physical health.  Clinical Assessment   PHQ-9 Assessments:    01/20/2024    1:11 PM 01/10/2024    9:07 AM 01/10/2024    8:49 AM 10/06/2023    8:58 AM 09/21/2023   10:31 AM  Depression screen PHQ 2/9  Decreased Interest 3 2 0 0 0  Down, Depressed, Hopeless 1 3 0 0 0  PHQ - 2 Score 4 5 0 0 0  Altered sleeping 0 0     Tired, decreased energy 3 3     Change in appetite 3 3     Feeling bad or failure about yourself  0 1     Trouble concentrating 0 0     Moving slowly or fidgety/restless 0 0     Suicidal thoughts 0 0     PHQ-9 Score 10 12     Difficult doing work/chores Very difficult Very difficult       GAD-7 Assessments:    01/20/2024    1:18 PM 01/10/2024    8:49 AM 10/06/2023    8:58 AM 04/14/2023   11:50 AM  GAD 7 : Generalized Anxiety Score  Nervous, Anxious, on Edge 0 0 0 0  Control/stop worrying 1 0 0 0  Worry too much - different things 1 0 0 0  Trouble relaxing 0 0 0 0  Restless 0 0 0 0  Easily annoyed or irritable 2 0 0 0  Afraid - awful might happen 2 0 0 0  Total GAD 7 Score 6 0 0 0  Anxiety Difficulty Somewhat difficult Not difficult at all  Not difficult at all  Social History:  Household: Lives with sister and daughter Marital status: not  Number of Children: 2 children 1 passed away Employment: School system Education:   Psychiatric Review of systems: Insomnia: Denies Changes in appetite: Overeating Decreased need for sleep: No Family history of bipolar disorder: Negative Hallucinations: No   Paranoia: No    Psychotropic medications: Current medications: None Patient taking medications as prescribed: NA Side effects reported: na  Current medications (medication list) Current Outpatient Medications on File Prior to Visit  Medication Sig Dispense Refill   acetaminophen  (TYLENOL ) 500 MG tablet Take 1,000 mg by mouth every 6 (six) hours as needed for moderate pain (pain score 4-6).     amLODipine  (NORVASC ) 2.5 MG tablet TAKE 1 TABLET(2.5  MG) BY MOUTH DAILY 90 tablet 0   amLODipine  (NORVASC ) 5 MG tablet Take one tablet by mouth once daily for blood pressure 90 tablet 3   BIOTIN PO Take 1 tablet by mouth daily.     buprenorphine (BUTRANS) 10 MCG/HR PTWK Place 1 patch onto the skin once a week.     diclofenac  (VOLTAREN ) 75 MG EC tablet TAKE 1 TABLET(75 MG) BY MOUTH TWICE DAILY WITH A MEAL 60 tablet 2   Ferrous Sulfate (IRON PO) Take 1 tablet by mouth daily.     furosemide  (LASIX ) 20 MG tablet Take one yablet twice weekly, as needed, for leg swelling 15 tablet 1   gabapentin  (NEURONTIN ) 300 MG capsule TAKE 1 CAPSULE(300 MG) BY MOUTH AT BEDTIME 90 capsule 2   metoprolol  succinate (TOPROL -XL) 25 MG 24 hr tablet Take 1 tablet (25 mg total) by mouth daily. 90 tablet 3   phentermine  (ADIPEX-P ) 37.5 MG tablet Take half tablet by mouth once daily every morning 30 tablet 1   potassium chloride  SA (KLOR-CON  M) 20 MEQ tablet TAKE 1 TABLET BY MOUTH 2 TIMES WEEKLY AS NEEDED WHEN TAKING FUROSEMIDE  FOR LEG SWELLING 15 tablet 1   Probiotic Product (PROBIOTIC PO) Take by mouth daily.     traMADol  (ULTRAM ) 50 MG tablet Take 1 tablet (50 mg total) by mouth every 6 (six) hours as needed. 30 tablet 0   Vitamin D , Ergocalciferol , (DRISDOL ) 1.25 MG (50000 UNIT) CAPS capsule Take 1 capsule (50,000 Units total) by mouth every 7 (seven) days. 12 capsule 1   [DISCONTINUED] famotidine  (PEPCID ) 40 MG tablet Take 1 tablet (40 mg total) by mouth daily. 90 tablet 1   Current Facility-Administered Medications on File Prior to Visit  Medication Dose Route Frequency Provider Last Rate Last Admin   bupivacaine -meloxicam  ER (ZYNRELEF ) injection 400 mg  400 mg Infiltration Once Margrette Taft BRAVO, MD        Psychiatric History: Past psychiatry diagnosis: Denies Patient currently being seen by therapist/psychiatrist: Denies Prior Suicide Attempts: Denies Past psychiatry Hospitalization(s): Denies Past history of violence: Denies  Traumatic Experiences: History  or current traumatic events (natural disaster, house fire, etc.)? no History or current physical trauma?  no History or current emotional trauma?  no History or current sexual trauma?  no History or current domestic or intimate partner violence?  no PTSD symptoms if any traumatic experiences no   Alcohol and/or Substance Use History   Tobacco Alcohol Other substances  Current use  Denies Denies  Past use     Past treatment      Withdrawal Potential: No  Self-harm Behaviors Risk Assessment Self-harm risk factors: No Patient endorses recent thoughts of harming self:  Denies [place] Suicide Severity Rating Scale:   Guns in the home:  Denies   Protective factors: Daughter, siblings, nieces and nephews  Danger to Others Risk Assessment Danger to others risk factors:   Patient endorses recent thoughts of harming others:   Dynamic Appraisal of Situational Aggression (DASA):   BH Counselor discussed emergency crisis plan with client and provided local emergency services resources.  Mental status exam:   General Appearance [place]:  Casual Eye Contact:  Good Motor Behavior:  Normal Speech:  Normal Level of Consciousness:  Alert Mood:  Depressed Affect:  Appropriate Anxiety Level:  Minimal Thought Process:  Coherent Thought Content:  WNL Perception:  Normal Judgment:  Good Insight:  Present  Diagnosis: Depresion   Goals: Increase healthy adjustment to current life circumstances   Interventions: CBT Cognitive Behavioral Therapy and Medication Monitoring

## 2024-01-21 ENCOUNTER — Other Ambulatory Visit: Payer: Self-pay | Admitting: Family Medicine

## 2024-01-27 ENCOUNTER — Encounter (HOSPITAL_COMMUNITY): Payer: Self-pay

## 2024-01-27 ENCOUNTER — Ambulatory Visit (HOSPITAL_COMMUNITY)
Admission: RE | Admit: 2024-01-27 | Discharge: 2024-01-27 | Disposition: A | Discharge status: Home / Self Care | Source: Ambulatory Visit | Attending: Family Medicine | Admitting: Family Medicine

## 2024-01-27 DIAGNOSIS — Z1231 Encounter for screening mammogram for malignant neoplasm of breast: Secondary | ICD-10-CM | POA: Insufficient documentation

## 2024-02-03 ENCOUNTER — Ambulatory Visit: Payer: Self-pay | Admitting: Professional Counselor

## 2024-02-03 DIAGNOSIS — F331 Major depressive disorder, recurrent, moderate: Secondary | ICD-10-CM

## 2024-02-03 NOTE — BH Specialist Note (Signed)
 [place] Virtual [facility] Telephone Follow-up  MRN: [id number] NAME: Julie Ryan Date: 02/03/24  Start time: Start Time: 0200 End time: Stop Time: 0230 Total time: Total Time in Minutes (Visit): 30 Call number: Visit Number: 2- Second Visit  Reason for call today:  Patient is a 58 year old female who presented for a collaborative care follow-up. She continues to report symptoms of depression, describing a lack of pleasure or interest in activities. She shared that her routine largely consists of working and then going home, where she tends to isolate herself.  We explored the option of initiating a medication trial. Initially, the patient expressed some reluctance, citing past experiences with medications that she felt were not effective. Psychoeducation was provided regarding the importance of allowing adequate time for antidepressant medications to take effect. I also explained our collaborative care model, including access to consulting psychiatrists who can help tailor medication options based on her current symptom profile.  Following this discussion, the patient became more open to the idea of a psychiatric consultation. A referral has been placed, and we plan to reassess and coordinate treatment following the consultation.  Follow-up is scheduled in two weeks.  PHQ-9 Scores:     02/03/2024    2:03 PM 01/20/2024    1:11 PM 01/10/2024    9:07 AM 01/10/2024    8:49 AM 10/06/2023    8:58 AM  Depression screen PHQ 2/9  Decreased Interest 3 3 2  0 0  Down, Depressed, Hopeless 1 1 3  0 0  PHQ - 2 Score 4 4 5  0 0  Altered sleeping 0 0 0    Tired, decreased energy 3 3 3     Change in appetite 1 3 3     Feeling bad or failure about yourself  0 0 1    Trouble concentrating 0 0 0    Moving slowly or fidgety/restless 0 0 0    Suicidal thoughts 0 0 0    PHQ-9 Score 8 10 12     Difficult doing work/chores Somewhat difficult Very difficult Very difficult     GAD-7 Scores:     02/03/2024    2:04 PM  01/20/2024    1:18 PM 01/10/2024    8:49 AM 10/06/2023    8:58 AM  GAD 7 : Generalized Anxiety Score  Nervous, Anxious, on Edge 0 0 0 0  Control/stop worrying 1 1 0 0  Worry too much - different things 1 1 0 0  Trouble relaxing 0 0 0 0  Restless 0 0 0 0  Easily annoyed or irritable 0 2 0 0  Afraid - awful might happen 0 2 0 0  Total GAD 7 Score 2 6 0 0  Anxiety Difficulty Somewhat difficult Somewhat difficult Not difficult at all     Stress Current stressors:  work  Sleep:  Good Appetite:  Good Coping ability:  Fair Patient taking medications as prescribed:  na  Current medications:  Outpatient Encounter Medications as of 02/03/2024  Medication Sig   acetaminophen  (TYLENOL ) 500 MG tablet Take 1,000 mg by mouth every 6 (six) hours as needed for moderate pain (pain score 4-6).   amLODipine  (NORVASC ) 2.5 MG tablet TAKE 1 TABLET(2.5 MG) BY MOUTH DAILY   amLODipine  (NORVASC ) 5 MG tablet Take one tablet by mouth once daily for blood pressure   BIOTIN PO Take 1 tablet by mouth daily.   buprenorphine (BUTRANS) 10 MCG/HR PTWK Place 1 patch onto the skin once a week.   diclofenac  (VOLTAREN ) 75 MG  EC tablet TAKE 1 TABLET(75 MG) BY MOUTH TWICE DAILY WITH A MEAL   Ferrous Sulfate (IRON PO) Take 1 tablet by mouth daily.   furosemide  (LASIX ) 20 MG tablet Take one yablet twice weekly, as needed, for leg swelling   gabapentin  (NEURONTIN ) 300 MG capsule TAKE 1 CAPSULE(300 MG) BY MOUTH AT BEDTIME   metoprolol  succinate (TOPROL -XL) 25 MG 24 hr tablet Take 1 tablet (25 mg total) by mouth daily.   phentermine  (ADIPEX-P ) 37.5 MG tablet Take half tablet by mouth once daily every morning   potassium chloride  SA (KLOR-CON  M) 20 MEQ tablet TAKE 1 TABLET BY MOUTH 2 TIMES WEEKLY AS NEEDED WHEN TAKING FUROSEMIDE  FOR LEG SWELLING   Probiotic Product (PROBIOTIC PO) Take by mouth daily.   traMADol  (ULTRAM ) 50 MG tablet Take 1 tablet (50 mg total) by mouth every 6 (six) hours as needed.   Vitamin D , Ergocalciferol ,  (DRISDOL ) 1.25 MG (50000 UNIT) CAPS capsule Take 1 capsule (50,000 Units total) by mouth every 7 (seven) days.   [DISCONTINUED] famotidine  (PEPCID ) 40 MG tablet Take 1 tablet (40 mg total) by mouth daily.   Facility-Administered Encounter Medications as of 02/03/2024  Medication   bupivacaine -meloxicam  ER (ZYNRELEF ) injection 400 mg     Self-harm Behaviors Risk Assessment Self-harm risk factors:  Depression, grief Patient endorses recent thoughts of harming self:  Denies  Danger to Others Risk Assessment Danger to others risk factors:  None Patient endorses recent thoughts of harming others:  Denies   Substance Use Assessment Patient recently consumed alcohol:  Denies  Alcohol Use Disorder Identification Test (AUDIT):     02/26/2020    1:48 PM 01/05/2023    2:23 PM 03/03/2023    4:40 AM 06/01/2023   11:29 AM 09/21/2023    6:39 AM 01/09/2024    5:20 PM  Alcohol Use Disorder Test (AUDIT)  1. How often do you have a drink containing alcohol? 1 1 0 0 1 1  2. How many drinks containing alcohol do you have on a typical day when you are drinking? 0 0   0   3. How often do you have six or more drinks on one occasion? 0 0 0 0 0 0  AUDIT-C Score 1 1   1        Patient-reported    Goals, Interventions and Follow-up Plan Goals: Increase healthy adjustment to current life circumstances Interventions: Behavioral Activation Follow-up Plan: 2 weeks    Julie Ryan

## 2024-02-07 NOTE — Procedures (Signed)
 [name]

## 2024-02-10 ENCOUNTER — Telehealth (INDEPENDENT_AMBULATORY_CARE_PROVIDER_SITE_OTHER): Payer: Self-pay | Admitting: Professional Counselor

## 2024-02-10 DIAGNOSIS — F331 Major depressive disorder, recurrent, moderate: Secondary | ICD-10-CM

## 2024-02-10 NOTE — BH Specialist Note (Signed)
 Virtual Behavioral Health Treatment Plan Team Note  MRN: [id number] NAME: Julie Ryan  DATE: 02/10/24  Start time: Start Time: 1106 End time: Stop Time: 1111 Total time: Total Time in Minutes (Visit): 5  Total number of Virtual BH Treatment Team Plan encounters: 1/4  Treatment Team Attendees: Carter Becker and Redell Corn spent 15 minutes discussing and documenting  Diagnoses:    ICD-10-CM   1. MDD (major depressive disorder), recurrent episode, moderate (HCC)  F33.1       Goals, Interventions and Follow-up Plan Goals: Increase healthy adjustment to current life circumstances Interventions: Behavioral Activation Medication Management Recommendations: Lexapro  10 mg daily Follow-up Plan: 2 weeks  History of the present illness Presenting Problem/Current Symptoms:  The patient is a 58 year old female presenting for an initial collaborative care assessment with a primary complaint of persistent depressive symptoms. She reports experiencing low mood, lack of motivation, and significant fatigue. Outside of work, she states that she spends most of her time sitting at home with little energy or desire to engage in activities.   Her symptoms appear to be compounded by chronic knee pain and a history of grief and loss. She reports undergoing knee surgery, which has limited her physical activity and negatively impacted her ability to manage her weight--something that has further contributed to her emotional distress. Additionally, she has experienced the loss of several close family members over time, including her mother, father, son, sister, and brother. This cumulative grief appears to play a significant role in her current emotional state.   She currently resides with her sister and adult daughter, though she describes the home environment as stressful. She is not married and has no history of psychiatric hospitalization or prior mental health treatment. She denies any history of trauma,  substance use, or suicidal ideation, and she continues to deny any current suicidality.   The patient expresses a strong desire for change, stating that she feels stuck in the same routine and is ready to take steps toward improving her mental and physical health.   Screenings PHQ-9 Assessments:     02/03/2024    2:03 PM 01/20/2024    1:11 PM 01/10/2024    9:07 AM  Depression screen PHQ 2/9  Decreased Interest 3 3 2   Down, Depressed, Hopeless 1 1 3   PHQ - 2 Score 4 4 5   Altered sleeping 0 0 0  Tired, decreased energy 3 3 3   Change in appetite 1 3 3   Feeling bad or failure about yourself  0 0 1  Trouble concentrating 0 0 0  Moving slowly or fidgety/restless 0 0 0  Suicidal thoughts 0 0 0  PHQ-9 Score 8 10 12   Difficult doing work/chores Somewhat difficult Very difficult Very difficult   GAD-7 Assessments:     02/03/2024    2:04 PM 01/20/2024    1:18 PM 01/10/2024    8:49 AM 10/06/2023    8:58 AM  GAD 7 : Generalized Anxiety Score  Nervous, Anxious, on Edge 0 0 0 0  Control/stop worrying 1 1 0 0  Worry too much - different things 1 1 0 0  Trouble relaxing 0 0 0 0  Restless 0 0 0 0  Easily annoyed or irritable 0 2 0 0  Afraid - awful might happen 0 2 0 0  Total GAD 7 Score 2 6 0 0  Anxiety Difficulty Somewhat difficult Somewhat difficult Not difficult at all     Past Medical History Past Medical History:  Diagnosis Date  Angio-edema    Anxiety    Arthritis    [employer] 08/19/2020   Carpal tunnel syndrome of right wrist    Chronic knee pain    Depression    Diabetes mellitus without complication [facility])    Endometrial polyp 08/09/2017   will get appt with Dr Jayne   Fibroids 08/09/2017   Has multiple small fibroids    GERD (gastroesophageal reflux disease)    Hypertension    Neuropathy    Obesity    Prediabetes    Seizures (HCC)    1 seizure as a chold; unknown etiology and has never been on meds   Sleep apnea     Vital signs: There were no vitals filed for this  visit.  Allergies:  Allergies as of 02/10/2024 - Review Complete 01/10/2024  Allergen Reaction Noted   Codeine  Nausea Only and Other (See Comments)    Effexor  xr [venlafaxine  hcl er] Other (See Comments) 08/02/2014   Fluoxetine  Other (See Comments) 08/02/2014   Hydrocodone  Nausea Only and Other (See Comments) 03/20/2012   Methotrexate  derivatives  11/11/2020   Latex Itching and Rash 03/20/2012    Medication History Current medications:  Outpatient Encounter Medications as of 02/10/2024  Medication Sig   acetaminophen  (TYLENOL ) 500 MG tablet Take 1,000 mg by mouth every 6 (six) hours as needed for moderate pain (pain score 4-6).   amLODipine  (NORVASC ) 2.5 MG tablet TAKE 1 TABLET(2.5 MG) BY MOUTH DAILY   amLODipine  (NORVASC ) 5 MG tablet Take one tablet by mouth once daily for blood pressure   BIOTIN PO Take 1 tablet by mouth daily.   buprenorphine (BUTRANS) 10 MCG/HR PTWK Place 1 patch onto the skin once a week.   diclofenac  (VOLTAREN ) 75 MG EC tablet TAKE 1 TABLET(75 MG) BY MOUTH TWICE DAILY WITH A MEAL   Ferrous Sulfate (IRON PO) Take 1 tablet by mouth daily.   furosemide  (LASIX ) 20 MG tablet Take one yablet twice weekly, as needed, for leg swelling   gabapentin  (NEURONTIN ) 300 MG capsule TAKE 1 CAPSULE(300 MG) BY MOUTH AT BEDTIME   metoprolol  succinate (TOPROL -XL) 25 MG 24 hr tablet Take 1 tablet (25 mg total) by mouth daily.   phentermine  (ADIPEX-P ) 37.5 MG tablet Take half tablet by mouth once daily every morning   potassium chloride  SA (KLOR-CON  M) 20 MEQ tablet TAKE 1 TABLET BY MOUTH 2 TIMES WEEKLY AS NEEDED WHEN TAKING FUROSEMIDE  FOR LEG SWELLING   Probiotic Product (PROBIOTIC PO) Take by mouth daily.   traMADol  (ULTRAM ) 50 MG tablet Take 1 tablet (50 mg total) by mouth every 6 (six) hours as needed.   Vitamin D , Ergocalciferol , (DRISDOL ) 1.25 MG (50000 UNIT) CAPS capsule Take 1 capsule (50,000 Units total) by mouth every 7 (seven) days.   [DISCONTINUED] famotidine  (PEPCID ) 40 MG  tablet Take 1 tablet (40 mg total) by mouth daily.   Facility-Administered Encounter Medications as of 02/10/2024  Medication   bupivacaine -meloxicam  ER (ZYNRELEF ) injection 400 mg     Scribe for Treatment Team: Redell JINNY Corn

## 2024-02-13 ENCOUNTER — Other Ambulatory Visit: Payer: Self-pay | Admitting: Orthopedic Surgery

## 2024-02-13 DIAGNOSIS — M1710 Unilateral primary osteoarthritis, unspecified knee: Secondary | ICD-10-CM

## 2024-02-17 ENCOUNTER — Ambulatory Visit: Payer: Self-pay | Admitting: Professional Counselor

## 2024-02-17 DIAGNOSIS — F321 Major depressive disorder, single episode, moderate: Secondary | ICD-10-CM

## 2024-02-17 MED ORDER — ESCITALOPRAM OXALATE 10 MG PO TABS: 10.0000 mg | ORAL_TABLET | Freq: Every day | ORAL | 4 refills | Status: DC

## 2024-02-17 NOTE — BH Specialist Note (Unsigned)
  [place] Virtual BH Telephone Follow-up  MRN: [id number] NAME: Julie Ryan Date: 02/17/24  Start time: Start Time: 0230 End time: Stop Time: 0300 Total time: Total Time in Minutes (Visit): 30 Call number: Visit Number: 4- Fourth Visit  Reason for call today:    PHQ-9 Scores:     02/17/2024    2:54 PM 02/03/2024    2:03 PM 01/20/2024    1:11 PM 01/10/2024    9:07 AM 01/10/2024    8:49 AM  Depression screen PHQ 2/9  Decreased Interest 2 3 3 2  0  Down, Depressed, Hopeless 2 1 1 3  0  PHQ - 2 Score 4 4 4 5  0  Altered sleeping 0 0 0 0   Tired, decreased energy 3 3 3 3    Change in appetite 2 1 3 3    Feeling bad or failure about yourself  0 0 0 1   Trouble concentrating 0 0 0 0   Moving slowly or fidgety/restless 0 0 0 0   Suicidal thoughts 0 0 0 0   PHQ-9 Score 9 8 10 12    Difficult doing work/chores Somewhat difficult Somewhat difficult Very difficult Very difficult    GAD-7 Scores:     02/17/2024    2:55 PM 02/03/2024    2:04 PM 01/20/2024    1:18 PM 01/10/2024    8:49 AM  GAD 7 : Generalized Anxiety Score  Nervous, Anxious, on Edge 0 0 0 0  Control/stop worrying 0 1 1 0  Worry too much - different things 1 1 1  0  Trouble relaxing 0 0 0 0  Restless 0 0 0 0  Easily annoyed or irritable 1 0 2 0  Afraid - awful might happen 0 0 2 0  Total GAD 7 Score 2 2 6  0  Anxiety Difficulty Somewhat difficult Somewhat difficult Somewhat difficult Not difficult at all    Stress Current stressors:   Sleep:   Appetite:   Coping ability:   Patient taking medications as prescribed:    Current medications:  Outpatient Encounter Medications as of 02/17/2024  Medication Sig   acetaminophen  (TYLENOL ) 500 MG tablet Take 1,000 mg by mouth every 6 (six) hours as needed for moderate pain (pain score 4-6).   amLODipine  (NORVASC ) 2.5 MG tablet TAKE 1 TABLET(2.5 MG) BY MOUTH DAILY   amLODipine  (NORVASC ) 5 MG tablet Take one tablet by mouth once daily for blood pressure   BIOTIN PO Take 1 tablet by  mouth daily.   buprenorphine (BUTRANS) 10 MCG/HR PTWK Place 1 patch onto the skin once a week.   diclofenac  (VOLTAREN ) 75 MG EC tablet TAKE 1 TABLET(75 MG) BY MOUTH TWICE DAILY WITH A MEAL   Ferrous Sulfate (IRON PO) Take 1 tablet by mouth daily.   furosemide  (LASIX ) 20 MG tablet Take one yablet twice weekly, as needed, for leg swelling   gabapentin  (NEURONTIN ) 300 MG capsule TAKE 1 CAPSULE(300 MG) BY MOUTH AT BEDTIME   metoprolol  succinate (TOPROL -XL) 25 MG 24 hr tablet Take 1 tablet (25 mg total) by mouth daily.   phentermine  (ADIPEX-P ) 37.5 MG tablet Take half tablet by mouth once daily every morning   potassium chloride  SA (KLOR-CON  M) 20 MEQ tablet TAKE 1 TABLET BY MOUTH 2 TIMES WEEKLY AS NEEDED WHEN TAKING FUROSEMIDE  FOR LEG SWELLING   Probiotic Product (PROBIOTIC PO) Take by mouth daily.   traMADol  (ULTRAM ) 50 MG tablet Take 1 tablet (50 mg total) by mouth every 6 (six) hours as needed.  Vitamin D , Ergocalciferol , (DRISDOL ) 1.25 MG (50000 UNIT) CAPS capsule Take 1 capsule (50,000 Units total) by mouth every 7 (seven) days.   [DISCONTINUED] famotidine  (PEPCID ) 40 MG tablet Take 1 tablet (40 mg total) by mouth daily.   Facility-Administered Encounter Medications as of 02/17/2024  Medication   bupivacaine -meloxicam  ER (ZYNRELEF ) injection 400 mg     Self-harm Behaviors Risk Assessment Self-harm risk factors:   Patient endorses recent thoughts of harming self:    [place] Suicide Severity Rating Scale: Failed to redirect to the Timeline version of the REVFS SmartLink.   Danger to Others Risk Assessment Danger to others risk factors:   Patient endorses recent thoughts of harming others:    Dynamic Appraisal of Situational Aggression (DASA):      No data to display           Substance Use Assessment Patient recently consumed alcohol:    Alcohol Use Disorder Identification Test (AUDIT):     02/26/2020    1:48 PM 01/05/2023    2:23 PM 03/03/2023    4:40 AM 06/01/2023   11:29  AM 09/21/2023    6:39 AM 01/09/2024    5:20 PM  Alcohol Use Disorder Test (AUDIT)  1. How often do you have a drink containing alcohol? 1 1 0 0 1 1  2. How many drinks containing alcohol do you have on a typical day when you are drinking? 0 0   0   3. How often do you have six or more drinks on one occasion? 0 0 0 0 0 0  AUDIT-C Score 1 1   1        Patient-reported   Patient recently used drugs:    Opioid Risk Assessment:    Goals, Interventions and Follow-up Plan Goals: {IBH Goals:21014053} Interventions: {IBH Interventions:21014054} Follow-up Plan: {Virtual BH Follow up Recommendations:21014064}  Summary: ***  Julie Ryan

## 2024-02-17 NOTE — Progress Notes (Unsigned)
 mED IS PRESCRIBED AND I D/W PT INCREASED RISK OF Gi BLEED WITH DICLOFENAC  SO LIMIT USE

## 2024-02-20 ENCOUNTER — Encounter: Payer: Self-pay | Admitting: Family Medicine

## 2024-03-01 ENCOUNTER — Ambulatory Visit (INDEPENDENT_AMBULATORY_CARE_PROVIDER_SITE_OTHER): Admitting: Professional Counselor

## 2024-03-01 DIAGNOSIS — F321 Major depressive disorder, single episode, moderate: Secondary | ICD-10-CM

## 2024-03-02 ENCOUNTER — Ambulatory Visit: Admitting: Professional Counselor

## 2024-03-12 ENCOUNTER — Other Ambulatory Visit (HOSPITAL_COMMUNITY): Payer: Self-pay

## 2024-03-12 NOTE — BH Specialist Note (Signed)
 [place] Follow-up  MRN: [id number] NAME: Julie Ryan Date:   Start time: Start Time: 0900 End time: Stop Time: 0930 Total time: Total Time in Minutes (Visit): 30 Call number: Visit Number: 5-Fifth Visit  Reason for call today:  Patient is a 58 year old female seen via telehealth for a collaborative care follow-up. She reports returning to work and feeling more positive, noting that being around others helps reduce her depressive symptoms. She recently initiated medication as recommended, which represents significant progress given her previous reluctance to consider pharmacologic treatment. Although it is too early to assess full effectiveness, she remains open and hopeful about the benefits.  She denies any current suicidal ideation. Current scores are PHQ-9 = 9 and GAD-7 = 2, reflecting mild symptoms overall. She describes managing relatively well, though acknowledges some ongoing low mood. Brief interventions were provided to support coping and symptom management while allowing time for the medication to take effect.  A follow-up is scheduled to monitor her response to the medication and continue providing brief interventions as needed.    PHQ-9 Scores:     02/17/2024    2:54 PM 02/03/2024    2:03 PM 01/20/2024    1:11 PM 01/10/2024    9:07 AM 01/10/2024    8:49 AM  Depression screen PHQ 2/9  Decreased Interest 2 3 3 2  0  Down, Depressed, Hopeless 2 1 1 3  0  PHQ - 2 Score 4 4 4 5  0  Altered sleeping 0 0 0 0   Tired, decreased energy 3 3 3 3    Change in appetite 2 1 3 3    Feeling bad or failure about yourself  0 0 0 1   Trouble concentrating 0 0 0 0   Moving slowly or fidgety/restless 0 0 0 0   Suicidal thoughts 0 0 0 0   PHQ-9 Score 9 8 10 12    Difficult doing work/chores Somewhat difficult Somewhat difficult Very difficult Very difficult    GAD-7 Scores:     02/17/2024    2:55 PM 02/03/2024    2:04 PM 01/20/2024    1:18 PM 01/10/2024    8:49 AM  GAD 7 : Generalized Anxiety  Score  Nervous, Anxious, on Edge 0 0 0 0  Control/stop worrying 0 1 1 0  Worry too much - different things 1 1 1  0  Trouble relaxing 0 0 0 0  Restless 0 0 0 0  Easily annoyed or irritable 1 0 2 0  Afraid - awful might happen 0 0 2 0  Total GAD 7 Score 2 2 6  0  Anxiety Difficulty Somewhat difficult Somewhat difficult Somewhat difficult Not difficult at all    Stress Current stressors:  Work Sleep:  Oversleeping Appetite:  Good Coping ability:  Good Patient taking medications as prescribed:  Yes  Current medications:  Outpatient Encounter Medications as of 03/01/2024  Medication Sig   acetaminophen  (TYLENOL ) 500 MG tablet Take 1,000 mg by mouth every 6 (six) hours as needed for moderate pain (pain score 4-6).   amLODipine  (NORVASC ) 2.5 MG tablet TAKE 1 TABLET(2.5 MG) BY MOUTH DAILY   amLODipine  (NORVASC ) 5 MG tablet Take one tablet by mouth once daily for blood pressure   BIOTIN PO Take 1 tablet by mouth daily.   buprenorphine (BUTRANS) 10 MCG/HR PTWK Place 1 patch onto the skin once a week.   diclofenac  (VOLTAREN ) 75 MG EC tablet TAKE 1 TABLET(75 MG) BY MOUTH TWICE DAILY WITH A MEAL   escitalopram  (LEXAPRO )  10 MG tablet Take 1 tablet (10 mg total) by mouth daily.   Ferrous Sulfate (IRON PO) Take 1 tablet by mouth daily.   furosemide  (LASIX ) 20 MG tablet Take one yablet twice weekly, as needed, for leg swelling   gabapentin  (NEURONTIN ) 300 MG capsule TAKE 1 CAPSULE(300 MG) BY MOUTH AT BEDTIME   metoprolol  succinate (TOPROL -XL) 25 MG 24 hr tablet Take 1 tablet (25 mg total) by mouth daily.   phentermine  (ADIPEX-P ) 37.5 MG tablet Take half tablet by mouth once daily every morning   potassium chloride  SA (KLOR-CON  M) 20 MEQ tablet TAKE 1 TABLET BY MOUTH 2 TIMES WEEKLY AS NEEDED WHEN TAKING FUROSEMIDE  FOR LEG SWELLING   Probiotic Product (PROBIOTIC PO) Take by mouth daily.   traMADol  (ULTRAM ) 50 MG tablet Take 1 tablet (50 mg total) by mouth every 6 (six) hours as needed.   Vitamin D ,  Ergocalciferol , (DRISDOL ) 1.25 MG (50000 UNIT) CAPS capsule Take 1 capsule (50,000 Units total) by mouth every 7 (seven) days.   [DISCONTINUED] famotidine  (PEPCID ) 40 MG tablet Take 1 tablet (40 mg total) by mouth daily.   Facility-Administered Encounter Medications as of 03/01/2024  Medication   bupivacaine -meloxicam  ER (ZYNRELEF ) injection 400 mg     Self-harm Behaviors Risk Assessment Self-harm risk factors:  Depression Patient endorses recent thoughts of harming self:  Denies   Danger to Others Risk Assessment Danger to others risk factors:  None Patient endorses recent thoughts of harming others:  Denies   Substance Use Assessment Patient recently consumed alcohol:  Denies  Alcohol Use Disorder Identification Test (AUDIT):     02/26/2020    1:48 PM 01/05/2023    2:23 PM 03/03/2023    4:40 AM 06/01/2023   11:29 AM 09/21/2023    6:39 AM 01/09/2024    5:20 PM  Alcohol Use Disorder Test (AUDIT)  1. How often do you have a drink containing alcohol? 1 1 0 0 1 1  2. How many drinks containing alcohol do you have on a typical day when you are drinking? 0 0   0   3. How often do you have six or more drinks on one occasion? 0 0 0 0 0 0  AUDIT-C Score 1 1   1        Patient-reported    Goals, Interventions and Follow-up Plan Goals: Increase healthy adjustment to current life circumstances Interventions: Medication Monitoring Follow-up Plan: Follow up 2 weeks    Redell JINNY Corn

## 2024-03-23 ENCOUNTER — Other Ambulatory Visit: Payer: Self-pay | Admitting: Family Medicine

## 2024-04-19 ENCOUNTER — Other Ambulatory Visit: Payer: Self-pay | Admitting: Family Medicine

## 2024-04-24 ENCOUNTER — Other Ambulatory Visit (HOSPITAL_COMMUNITY): Payer: Self-pay

## 2024-04-24 ENCOUNTER — Telehealth: Payer: Self-pay | Admitting: Pharmacy Technician

## 2024-04-24 NOTE — Telephone Encounter (Signed)
 Pharmacy Patient Advocate Encounter   Received notification from CoverMyMeds that prior authorization for Phentermine  HCl 37.5MG  tablets is required/requested.   Insurance verification completed.   The patient is insured through [facility].   Per test claim: PA required; PA submitted to above mentioned insurance via Latent Key/confirmation #/EOC [id number] Status is pending

## 2024-04-24 NOTE — Telephone Encounter (Signed)
 Pharmacy Patient Advocate Encounter  Received notification from [facility] that Prior Authorization for Phentermine  HCl 37.5MG  tablets has been APPROVED from 04/24/2024 to 07/25/2024. Ran test claim, Copay is $1.29. This test claim was processed through [facility]- copay amounts may vary at other pharmacies due to pharmacy/plan contracts, or as the patient moves through the different stages of their insurance plan.   PA #/Case ID/Reference #: [id number]

## 2024-04-30 ENCOUNTER — Other Ambulatory Visit: Payer: Self-pay | Admitting: Family Medicine

## 2024-05-04 ENCOUNTER — Other Ambulatory Visit (INDEPENDENT_AMBULATORY_CARE_PROVIDER_SITE_OTHER): Payer: Self-pay

## 2024-05-04 ENCOUNTER — Ambulatory Visit: Admitting: Orthopedic Surgery

## 2024-05-04 VITALS — Ht 66.0 in | Wt 220.6 lb

## 2024-05-04 DIAGNOSIS — M79641 Pain in right hand: Secondary | ICD-10-CM

## 2024-05-04 DIAGNOSIS — M1710 Unilateral primary osteoarthritis, unspecified knee: Secondary | ICD-10-CM

## 2024-05-04 DIAGNOSIS — M1711 Unilateral primary osteoarthritis, right knee: Secondary | ICD-10-CM

## 2024-05-04 DIAGNOSIS — Z96652 Presence of left artificial knee joint: Secondary | ICD-10-CM | POA: Diagnosis not present

## 2024-05-04 DIAGNOSIS — M1712 Unilateral primary osteoarthritis, left knee: Secondary | ICD-10-CM

## 2024-05-04 NOTE — Patient Instructions (Signed)
 We are referring you to [facility] from [facility] address is [address] [place] The phone number is [phone]  The office will call you with an appointment Dr. Eldonna will do the nerve study for your hand    Your surgery will be at [facility] by Dr Margrette on Dec 9 The hospital will contact you with a preoperative appointment to discuss Anesthesia.  Please arrive on time or 15 minutes early for the preoperative appointment, they have a very tight schedule if you are late or do not come in your surgery will be cancelled.  The phone number is [phone]. Please bring your medications with you for the appointment. They will tell you the arrival time and medication instructions when you have your preoperative evaluation. Do not wear nail polish the day of your surgery and if you take Phentermine  you need to stop this medication ONE WEEK prior to your surgery. If you take Invokana, Farxiga, Jardiance, or Steglatro) - Hold 72 hours before the procedure.  If you take Ozempic ,  Mounjaro , Bydureon or Trulicity do not take for 8 days before your surgery. If you take Victoza, Rybelsis, Saxenda  or Adlyxi stop 24 hours before the procedure.  Please arrive at the hospital 2 hours before procedure if scheduled at 9:30 or later in the day or at the time the nurse tells you at your preoperative visit.   If you have my chart do not use the time given in my chart use the time given to you by the nurse during your preoperative visit.   Your surgery  time may change. Please be available for phone calls the day of your surgery and the day before. The Short Stay department may need to discuss changes about your surgery time. Not reaching the you could lead to procedure delays and possible cancellation.  You must have a ride home and someone to stay with you for 24 to 48 hours. The person taking you home will receive and sign for the your discharge  instructions.  Please be prepared to give your support person's name and telephone number to [facility]. Dr Margrette will need that name and phone number post procedure.

## 2024-05-04 NOTE — Progress Notes (Signed)
   Chief Complaint  Patient presents with   Knee Pain    Right knee pain    Hand Pain    Right hand pain     Kiearra is a 58 year old female with osteoarthritis of the right knee status post left total knee in 2024.  She has been delaying right total knee arthroplasty but presents now saying she is ready to have it done  She has pain in the right knee functional disability interference with activities of daily living  Review of systems she complains of pain in her right hand she did have a right carpal tunnel release in 2017 with good result She says her hand hurts more at night and at work she has pain in the index long and ring finger wrist and forearm  She is status post gastric bypass surgery in 2020 Codeine  and hydrocodone  caused nausea  No results found.    Range of motion in the right knee  Skin envelope is normal  Tenderness over the medial compartment  Range of motion 5-1 20  Stability tests normal  Muscle tone and strength normal   Assessment and plan  Encounter Diagnoses  Name Primary?   Primary osteoarthritis of right knee Yes   H/O total knee replacement, left    Pain in right hand    Right total knee arthroplasty at the patient's convenience The procedure has been fully reviewed with the patient; The risks and benefits of surgery have been discussed and explained and understood. Alternative treatment has also been reviewed, questions were encouraged and answered. The postoperative plan is also been reviewed.  History and physical will be dictated for the surgical procedure Patient-specific risk factors include  history of Roux-en-Y gastric bypass  positive rheumatoid factor  Hand: Recommend nerve conduction study

## 2024-05-04 NOTE — Progress Notes (Signed)
    05/04/2024   Chief Complaint  Patient presents with   Knee Pain    Right knee pain    Hand Pain    Right hand pain     Encounter Diagnoses  Name Primary?   Primary osteoarthritis of right knee Yes   Primary osteoarthritis of left knee     What pharmacy do you use ? ______[facility] [place] [address]_____  DOI/DOS/ Date:   Did you get better, worse or no change (Answer below)

## 2024-05-08 ENCOUNTER — Other Ambulatory Visit: Payer: Self-pay | Admitting: Orthopedic Surgery

## 2024-05-08 DIAGNOSIS — Z01818 Encounter for other preprocedural examination: Secondary | ICD-10-CM

## 2024-05-08 DIAGNOSIS — M1711 Unilateral primary osteoarthritis, right knee: Secondary | ICD-10-CM

## 2024-05-08 MED ORDER — BUPIVACAINE-MELOXICAM ER 400-12 MG/14ML IJ SOLN: 400.0000 mg | Freq: Once | INTRAMUSCULAR | Status: DC

## 2024-05-11 ENCOUNTER — Ambulatory Visit: Admitting: Family Medicine

## 2024-05-11 VITALS — BP 124/77 | Ht 66.0 in | Wt 224.1 lb

## 2024-05-11 DIAGNOSIS — F411 Generalized anxiety disorder: Secondary | ICD-10-CM | POA: Diagnosis not present

## 2024-05-11 DIAGNOSIS — I10 Essential (primary) hypertension: Secondary | ICD-10-CM

## 2024-05-11 DIAGNOSIS — Z23 Encounter for immunization: Secondary | ICD-10-CM | POA: Diagnosis not present

## 2024-05-11 DIAGNOSIS — E559 Vitamin D deficiency, unspecified: Secondary | ICD-10-CM

## 2024-05-11 MED ORDER — TIRZEPATIDE-WEIGHT MANAGEMENT 2.5 MG/0.5ML ~~LOC~~ SOLN
2.5000 mg | SUBCUTANEOUS | 0 refills | Status: AC
Start: 2024-05-11 — End: ?

## 2024-05-11 NOTE — Patient Instructions (Signed)
 F/U in 12 weeks, pneumonia vaccine at visit   Flu vaccine today  New for weight loss is zepbound , need to stop swets also, let me know by message if you are able to fill script  All the best with right knee surgery  Thanks for choosing [facility], we consider it a privelige to serve you.

## 2024-05-14 ENCOUNTER — Encounter: Payer: Self-pay | Admitting: Family Medicine

## 2024-05-14 DIAGNOSIS — Z23 Encounter for immunization: Secondary | ICD-10-CM | POA: Insufficient documentation

## 2024-05-14 NOTE — Assessment & Plan Note (Signed)
 Controlled, no change in medication DASH diet and commitment to daily physical activity for a minimum of 30 minutes discussed and encouraged, as a part of hypertension management. The importance of attaining a healthy weight is also discussed.     05/11/2024    9:32 AM 05/04/2024   11:33 AM 01/10/2024    8:47 AM 10/09/2023    8:57 AM 10/06/2023    9:35 AM 10/06/2023    8:56 AM 09/21/2023   10:30 AM  BP/Weight  Systolic BP 124  887 120 118 119 133  Diastolic BP 77  72 80 20 76 75  Wt. (Lbs) 224.12 220.6 217.04   210.04 208.12  BMI 36.17 kg/m2 35.61 kg/m2 35.03 kg/m2   33.9 kg/m2 33.59 kg/m2

## 2024-05-14 NOTE — Assessment & Plan Note (Signed)
 After obtaining informed consent, the influenza vaccine is  administered , with no adverse effect noted at the time of administration.

## 2024-05-14 NOTE — Progress Notes (Signed)
 Julie Ryan     MRN: [id number]      DOB: 08-27-1965  Chief Complaint  Patient presents with   Hypertension    Follow up Would like to discuss shots for weight loss    HPI Julie Ryan is here for follow up and re-evaluation of chronic medical conditions, medication management and review of any available recent lab and radiology data.  Preventive health is updated, specifically  Cancer screening and Immunization.   Questions or concerns regarding consultations or procedures which the PT has had in the interim are  addressed. The PT denies any adverse reaction to medication. c/o inability to lose weight wants to resume medication Has upcoming right knee surgery and states this will improve her quality of life, no falls , but marked limitation in mobility   ROS Denies recent fever or chills. Denies sinus pressure, nasal congestion, ear pain or sore throat. Denies chest congestion, productive cough or wheezing. Denies chest pains, palpitations and leg swelling Denies abdominal pain, nausea, vomiting,diarrhea or constipation.   Denies dysuria, frequency, hesitancy or incontinence. Denies headaches, seizures, numbness, or tingling. Denies depression, uncontrolled  anxiety or insomnia. Denies skin break down or rash.   PE  BP 124/77   Ht 5' 6 (1.676 m)   Wt 224 lb 1.9 oz (101.7 kg)   LMP 08/31/2017 (Approximate)   BMI 36.17 kg/m   Patient alert and oriented and in no cardiopulmonary distress.  HEENT: No facial asymmetry, EOMI,     Neck supple .  Chest: Clear to auscultation bilaterally.  CVS: S1, S2 no murmurs, no S3.Regular rate.  ABD: Soft non tender.   Ext: No edema  MS: Adequate ROM spine, shoulders, hips and reduced in  knees.  Skin: Intact, no ulcerations or rash noted.  Psych: Good eye contact, normal affect. Memory intact not anxious or depressed appearing.  CNS: CN 2-12 intact, power,  normal throughout.no focal deficits noted.   Assessment &  Plan  Essential hypertension Controlled, no change in medication DASH diet and commitment to daily physical activity for a minimum of 30 minutes discussed and encouraged, as a part of hypertension management. The importance of attaining a healthy weight is also discussed.     05/11/2024    9:32 AM 05/04/2024   11:33 AM 01/10/2024    8:47 AM 10/09/2023    8:57 AM 10/06/2023    9:35 AM 10/06/2023    8:56 AM 09/21/2023   10:30 AM  BP/Weight  Systolic BP 124  887 120 118 119 133  Diastolic BP 77  72 80 20 76 75  Wt. (Lbs) 224.12 220.6 217.04   210.04 208.12  BMI 36.17 kg/m2 35.61 kg/m2 35.03 kg/m2   33.9 kg/m2 33.59 kg/m2       MORBID OBESITY  Patient re-educated about  the importance of commitment to a  minimum of 150 minutes of exercise per week as able.  The importance of healthy food choices with portion control discussed, as well as eating regularly and within a 12 hour window most days. The need to choose clean , green food 50 to 75% of the time is discussed, as well as to make water  the primary drink and set a goal of 64 ounces water  daily.       05/11/2024    9:32 AM 05/04/2024   11:33 AM 01/10/2024    8:47 AM  Weight /BMI  Weight 224 lb 1.9 oz 220 lb 9.6 oz 217 lb 0.6 oz  Height 5'  6 (1.676 m) 5' 6 (1.676 m) 5' 6 (1.676 m)  BMI 36.17 kg/m2 35.61 kg/m2 35.03 kg/m2    Resume zepbound   Need for vaccination After obtaining informed consent, the influenza vaccine is  administered , with no adverse effect noted at the time of administration.   GAD (generalized anxiety disorder) Controlled, no change in medication   Vitamin D  deficiency Continue weekly supplement

## 2024-05-14 NOTE — Assessment & Plan Note (Signed)
 Continue weekly supplement

## 2024-05-14 NOTE — Assessment & Plan Note (Signed)
  Patient re-educated about  the importance of commitment to a  minimum of 150 minutes of exercise per week as able.  The importance of healthy food choices with portion control discussed, as well as eating regularly and within a 12 hour window most days. The need to choose clean , green food 50 to 75% of the time is discussed, as well as to make water  the primary drink and set a goal of 64 ounces water  daily.       05/11/2024    9:32 AM 05/04/2024   11:33 AM 01/10/2024    8:47 AM  Weight /BMI  Weight 224 lb 1.9 oz 220 lb 9.6 oz 217 lb 0.6 oz  Height 5' 6 (1.676 m) 5' 6 (1.676 m) 5' 6 (1.676 m)  BMI 36.17 kg/m2 35.61 kg/m2 35.03 kg/m2    Resume zepbound

## 2024-05-14 NOTE — Assessment & Plan Note (Signed)
 Controlled, no change in medication

## 2024-05-15 ENCOUNTER — Other Ambulatory Visit: Payer: Self-pay | Admitting: Orthopedic Surgery

## 2024-05-15 DIAGNOSIS — M1711 Unilateral primary osteoarthritis, right knee: Secondary | ICD-10-CM

## 2024-05-17 ENCOUNTER — Encounter: Payer: Self-pay | Admitting: Family Medicine

## 2024-05-21 ENCOUNTER — Other Ambulatory Visit: Payer: Self-pay | Admitting: Family Medicine

## 2024-05-23 ENCOUNTER — Other Ambulatory Visit: Payer: Self-pay | Admitting: Orthopedic Surgery

## 2024-05-23 DIAGNOSIS — M1710 Unilateral primary osteoarthritis, unspecified knee: Secondary | ICD-10-CM

## 2024-05-24 ENCOUNTER — Other Ambulatory Visit: Payer: Self-pay | Admitting: Orthopedic Surgery

## 2024-05-24 DIAGNOSIS — M1710 Unilateral primary osteoarthritis, unspecified knee: Secondary | ICD-10-CM

## 2024-05-28 NOTE — Progress Notes (Signed)
 Julie Ryan - 58 y.o. female MRN [id number]  Date of birth: [birth year]  Office Visit Note: Visit Date: 05/29/2024 PCP: Antonetta Rollene BRAVO, MD Referred by: Margrette Taft BRAVO, MD  Subjective: Chief Complaint  Patient presents with   Right Hand - Pain, Numbness, Weakness   HPI: Julie Ryan is a 58 y.o. female who comes in today at the request of Dr. Taft Margrette for evaluation and management of chronic, worsening and severe pain, numbness and tingling in the Right upper extremities.  Patient is Right hand dominant.  She reports several months of worsening right hand pain with really more pain than tingling or numbness.  She does get some tingling and numbness at night and the pain seems to refer into the radial digits and in particular index middle and ring finger.  No left-sided complaints.  No frank radicular symptoms down the arms.  She does endorse neck pain and shoulder pain.  She has a complicated medical history of rheumatoid arthritis, metabolic syndrome and history of gastric bypass.  She has chronic musculoskeletal pain complaints.  She does have a history of prior carpal tunnel release in 2017.  I was able to see prior electrodiagnostic study by Dr. Havery Rumble.  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  She reports good relief of symptoms with prior surgery.   I spent more than 30 minutes speaking face-to-face with the patient with 50% of the time in counseling and discussing coordination of care.      Review of Systems  Musculoskeletal:  Positive for joint pain and neck pain.  Neurological:  Positive for tingling and focal weakness.  All other systems reviewed and are negative.  Otherwise per HPI.  Assessment & Plan: Visit Diagnoses:    ICD-10-CM   1. Paresthesia of skin  R20.2 NCV with EMG (electromyography)    2. Pain in right hand  M79.641     3. Right hand weakness  R29.898      4. S/P carpal tunnel release  Z98.890        Plan: Impression: The above electrodiagnostic study is ABNORMAL and reveals evidence of a mild right median nerve entrapment at the wrist (carpal tunnel syndrome) affecting sensory components.   There is no significant electrodiagnostic evidence of any other focal nerve entrapment, brachial plexopathy or cervical radiculopathy.   Recommendations: 1.  Follow-up with referring physician.  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  Careful clinical correlation however since she is having some musculoskeletal pain. 2.  Continue current management of symptoms. 3.  Continue use of resting splint at night-time and as needed during the day.  Meds & Orders: No orders of the defined types were placed in this encounter.   Orders Placed This Encounter  Procedures   NCV with EMG (electromyography)    Follow-up: Return for Taft Margrette, MD.   Procedures: No procedures performed  EMG & NCV Findings: Evaluation of the right median motor and the right ulnar sensory nerves showed reduced amplitude (R4.9, R8.5 V).  The right median (across palm) sensory nerve showed prolonged distal peak latency (Wrist, 4.0 ms) and prolonged distal peak latency (Palm, 2.3 ms).  All remaining nerves (as indicated in the following tables) were within normal limits.    All examined muscles (as indicated in the following table) showed no evidence of electrical instability.    Impression: The above electrodiagnostic study is ABNORMAL  and reveals evidence of a mild right median nerve entrapment at the wrist (carpal tunnel syndrome) affecting sensory components.   There is no significant electrodiagnostic evidence of any other focal nerve entrapment, brachial plexopathy or cervical radiculopathy.   Recommendations: 1.  Follow-up with referring physician. 2.  Continue current management of symptoms. 3.  Continue use of resting splint at night-time and as needed  during the day.  ___________________________ Prentice Eldonna BETTERS Board Certified, American Board of Physical Medicine and Rehabilitation    Nerve Conduction Studies Anti Sensory Summary Table   Stim Site NR Peak (ms) Norm Peak (ms) P-T Amp (V) Norm P-T Amp Site1 Site2 Delta-P (ms) Dist (cm) Vel (m/s) Norm Vel (m/s)  Right Median Acr Palm Anti Sensory (2nd Digit)  31.4C  Wrist    *4.0 <3.6 27.6 >10 Wrist Palm 1.7 0.0    Palm    *2.3 <2.0 7.4         Right Radial Anti Sensory (Base 1st Digit)  31.9C  Wrist    1.9 <3.1 15.7  Wrist Base 1st Digit 1.9 0.0    Right Ulnar Anti Sensory (5th Digit)  32C  Wrist    3.1 <3.7 *8.5 >15.0 Wrist 5th Digit 3.1 14.0 45 >38   Motor Summary Table   Stim Site NR Onset (ms) Norm Onset (ms) O-P Amp (mV) Norm O-P Amp Site1 Site2 Delta-0 (ms) Dist (cm) Vel (m/s) Norm Vel (m/s)  Right Median Motor (Abd Poll Brev)  32C  Wrist    3.5 <4.2 *4.9 >5 Elbow Wrist 4.2 23.5 56 >50  Elbow    7.7  4.1         Right Ulnar Motor (Abd Dig Min)  32.2C  Wrist    2.3 <4.2 8.8 >3 B Elbow Wrist 3.5 20.0 57 >53  B Elbow    5.8  11.5  A Elbow B Elbow 1.4 11.0 79 >53  A Elbow    7.2  11.6          EMG   Side Muscle Nerve Root Ins Act Fibs Psw Amp Dur Poly Recrt Int Bruna Comment  Right Abd Poll Brev Median C8-T1 Nml Nml Nml Nml Nml 0 Nml Nml   Right 1stDorInt Ulnar C8-T1 Nml Nml Nml Nml Nml 0 Nml Nml   Right PronatorTeres Median C6-7 Nml Nml Nml Nml Nml 0 Nml Nml   Right Biceps Musculocut C5-6 Nml Nml Nml Nml Nml 0 Nml Nml   Right Deltoid Axillary C5-6 Nml Nml Nml Nml Nml 0 Nml Nml     Nerve Conduction Studies Anti Sensory Left/Right Comparison   Stim Site L Lat (ms) R Lat (ms) L-R Lat (ms) L Amp (V) R Amp (V) L-R Amp (%) Site1 Site2 L Vel (m/s) R Vel (m/s) L-R Vel (m/s)  Median Acr Palm Anti Sensory (2nd Digit)  31.4C  Wrist  *4.0   27.6  Wrist Palm     Palm  *2.3   7.4        Radial Anti Sensory (Base 1st Digit)  31.9C  Wrist  1.9   15.7  Wrist Base 1st  Digit     Ulnar Anti Sensory (5th Digit)  32C  Wrist  3.1   *8.5  Wrist 5th Digit  45    Motor Left/Right Comparison   Stim Site L Lat (ms) R Lat (ms) L-R Lat (ms) L Amp (mV) R Amp (mV) L-R Amp (%) Site1 Site2 L Vel (m/s) R Vel (m/s) L-R Vel (m/s)  Median Motor (Abd Poll Brev)  32C  Wrist  3.5   *4.9  Elbow Wrist  56   Elbow  7.7   4.1        Ulnar Motor (Abd Dig Min)  32.2C  Wrist  2.3   8.8  B Elbow Wrist  57   B Elbow  5.8   11.5  A Elbow B Elbow  79   A Elbow  7.2   11.6           Waveforms:            Clinical History: 2016 Electrodiagnostic study of both upper limbs - Havery Dana, MD  No read on scanned report but Data showed 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   She reports that she quit smoking about 5 years ago. Her smoking use included cigarettes. She started smoking about 15 years ago. She has a 5 pack-year smoking history. She has been exposed to tobacco smoke. She has never used smokeless tobacco. No results for input(s): HGBA1C, LABURIC in the last 8760 hours.  Objective:  VS:  HT:    WT:   BMI:     BP:   HR: bpm  TEMP: ( )  RESP:  Physical Exam Vitals and nursing note reviewed.  Constitutional:      General: She is not in acute distress.    Appearance: Normal appearance. She is well-developed. She is obese.  HENT:     Head: Normocephalic and atraumatic.     Nose: Nose normal.     Mouth/Throat:     Mouth: Mucous membranes are moist.     Pharynx: Oropharynx is clear.  Eyes:     Conjunctiva/sclera: Conjunctivae normal.     Pupils: Pupils are equal, round, and reactive to light.  Cardiovascular:     Rate and Rhythm: Regular rhythm.  Pulmonary:     Effort: Pulmonary effort is normal. No respiratory distress.  Abdominal:     General: There is no distension.     Palpations: Abdomen is soft.     Tenderness: There is no guarding.  Musculoskeletal:        General: Tenderness present. No swelling or deformity.     Cervical back: Normal range  of motion and neck supple.     Right lower leg: No edema.     Left lower leg: No edema.     Comments: Inspection reveals no atrophy of the bilateral APB or FDI or hand intrinsics. There is no swelling, color changes, allodynia or dystrophic changes. There is 5 out of 5 strength in the bilateral wrist extension, finger abduction and long finger flexion. There is intact sensation to light touch in all dermatomal and peripheral nerve distributions.  There is a negative Phalen's test bilaterally. There is a negative Hoffmann's test bilaterally.  Skin:    General: Skin is warm and dry.     Findings: No erythema or rash.  Neurological:     General: No focal deficit present.     Mental Status: She is alert and oriented to person, place, and time.     Cranial Nerves: No cranial nerve deficit.     Sensory: No sensory deficit.     Motor: No weakness or abnormal muscle tone.     Coordination: Coordination normal.     Gait: Gait normal.  Psychiatric:        Mood and Affect: Mood normal.        Behavior: Behavior normal.  Thought Content: Thought content normal.     Ortho Exam  Imaging: No results found.  Past Medical/Family/Surgical/Social History: Medications & Allergies reviewed per EMR, new medications updated. 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 factor (HCC) 08/28/2020   High risk medication use 08/28/2020   Neck pain 08/19/2020   Dermatomycosis 07/01/2020   Hiatal hernia 07/17/2019   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/17/2019   Endometrial polyp 08/09/2017   Fibroids 08/09/2017   Dyslipidemia 06/27/2017   Leg edema 03/08/2017   GAD (generalized anxiety disorder) 06/01/2016   Metabolic syndrome X 07/08/2013   Erosive esophagitis 06/25/2013   GERD (gastroesophageal reflux disease) 03/22/2013   Left knee pain 03/22/2013   Snoring 08/05/2011   Vitamin D  deficiency 03/21/2011   Allergic rhinitis 09/20/2010   MORBID OBESITY 02/11/2010   Essential hypertension 02/11/2010   Past Medical History:  Diagnosis Date   Angio-edema    Anxiety    Arthritis    Phreesia 08/19/2020   Carpal tunnel syndrome of right wrist    Chronic knee pain    Depression    Diabetes mellitus without complication (HCC)    Endometrial polyp 08/09/2017   will get appt with Dr Jayne   Fibroids 08/09/2017   Has multiple small fibroids    GERD (gastroesophageal reflux disease)    Hypertension    Neuropathy    Obesity    Prediabetes    Seizures (HCC)    1 seizure as a chold; unknown etiology and has never been on meds   Sleep apnea    Family History  Problem Relation Age of Onset   Hypertension Mother    Heart disease Mother    Diabetes Mother    Diabetes Sister    Multiple sclerosis Sister    Lupus Sister    Heart disease Sister    CVA Sister    Depression Sister    Diabetes Brother    Hypertension Brother    Other Son        brain tumor   Hypertension Daughter    Diabetes Sister    Past Surgical History:  Procedure Laterality Date   APPENDECTOMY     CARPAL TUNNEL RELEASE Right 08/01/2015   Procedure: RIGHT CARPAL TUNNEL RELEASE;  Surgeon: Taft FORBES Minerva, MD;  Location: [facility];  Service: Orthopedics;  Laterality: Right;   CERVICAL POLYPECTOMY  09/14/2017   Procedure: ENDOMETRIAL POLYPECTOMY;  Surgeon: Jayne Vonn DEL, MD;  Location: [facility];  Service: Gynecology;;   COLONOSCOPY N/A 06/15/2017   Procedure: COLONOSCOPY;  Surgeon: Golda Claudis PENNER, MD;  Location: [facility];   Service: Endoscopy;  Laterality: N/A;  830   ENDOMETRIAL ABLATION N/A 09/14/2017   Procedure: ENDOMETRIAL ABLATION( Minerva);  Surgeon: Jayne Vonn DEL, MD;  Location: [facility];  Service: Gynecology;  Laterality: N/A;   ESOPHAGOGASTRODUODENOSCOPY N/A 05/25/2013   Procedure: ESOPHAGOGASTRODUODENOSCOPY (EGD);  Surgeon: Claudis PENNER Golda, MD;  Location: [facility];  Service: Endoscopy;  Laterality: N/A;  220   GASTRIC BYPASS  HYSTEROSCOPY WITH D & C N/A 09/14/2017   Procedure: DILATATION AND CURETTAGE /HYSTEROSCOPY;  Surgeon: Jayne Vonn DEL, MD;  Location: [facility];  Service: Gynecology;  Laterality: N/A;   TOTAL KNEE ARTHROPLASTY Left 05/11/2023   Procedure: TOTAL KNEE ARTHROPLASTY;  Surgeon: Margrette Taft BRAVO, MD;  Location: [facility];  Service: Orthopedics;  Laterality: Left;   TUBAL LIGATION     Social History   Occupational History   Occupation: school system  Tobacco Use   Smoking status: Former    Current packs/day: 0.00    Average packs/day: 0.5 packs/day for 10.0 years (5.0 ttl pk-yrs)    Types: Cigarettes    Start date: 05/13/2009    Quit date: 05/14/2019    Years since quitting: 5.0    Passive exposure: Current   Smokeless tobacco: Never  Vaping Use   Vaping status: Never Used  Substance and Sexual Activity   Alcohol use: No    Alcohol/week: 0.0 standard drinks of alcohol   Drug use: No   Sexual activity: Not Currently    Birth control/protection: Surgical, Post-menopausal    Comment: tubal & ablation

## 2024-05-29 ENCOUNTER — Ambulatory Visit: Admitting: Physical Medicine and Rehabilitation

## 2024-05-29 DIAGNOSIS — Z9889 Other specified postprocedural states: Secondary | ICD-10-CM | POA: Diagnosis not present

## 2024-05-29 DIAGNOSIS — M79641 Pain in right hand: Secondary | ICD-10-CM

## 2024-05-29 DIAGNOSIS — R29898 Other symptoms and signs involving the musculoskeletal system: Secondary | ICD-10-CM

## 2024-05-29 DIAGNOSIS — R202 Paresthesia of skin: Secondary | ICD-10-CM

## 2024-05-29 NOTE — Progress Notes (Signed)
 Pain Scale   Average Pain 6  Patient advising she has pain, numbness, tingling and weakness. Patient advising she is Right handed        +Driver, -BT, -Dye Allergies.

## 2024-05-30 NOTE — Procedures (Signed)
 EMG & NCV Findings: Evaluation of the right median motor and the right ulnar sensory nerves showed reduced amplitude (R4.9, R8.5 V).  The right median (across palm) sensory nerve showed prolonged distal peak latency (Wrist, 4.0 ms) and prolonged distal peak latency (Palm, 2.3 ms).  All remaining nerves (as indicated in the following tables) were within normal limits.    All examined muscles (as indicated in the following table) showed no evidence of electrical instability.    Impression: The above electrodiagnostic study is ABNORMAL and reveals evidence of a mild right median nerve entrapment at the wrist (carpal tunnel syndrome) affecting sensory components.   There is no significant electrodiagnostic evidence of any other focal nerve entrapment, brachial plexopathy or cervical radiculopathy.   Recommendations: 1.  Follow-up with referring physician. 2.  Continue current management of symptoms. 3.  Continue use of resting splint at night-time and as needed during the day.  ___________________________ Julie Ryan Board Certified, American Board of Physical Medicine and Rehabilitation    Nerve Conduction Studies Anti Sensory Summary Table   Stim Site NR Peak (ms) Norm Peak (ms) P-T Amp (V) Norm P-T Amp Site1 Site2 Delta-P (ms) Dist (cm) Vel (m/s) Norm Vel (m/s)  Right Median Acr Palm Anti Sensory (2nd Digit)  31.4C  Wrist    *4.0 <3.6 27.6 >10 Wrist Palm 1.7 0.0    Palm    *2.3 <2.0 7.4         Right Radial Anti Sensory (Base 1st Digit)  31.9C  Wrist    1.9 <3.1 15.7  Wrist Base 1st Digit 1.9 0.0    Right Ulnar Anti Sensory (5th Digit)  32C  Wrist    3.1 <3.7 *8.5 >15.0 Wrist 5th Digit 3.1 14.0 45 >38   Motor Summary Table   Stim Site NR Onset (ms) Norm Onset (ms) O-P Amp (mV) Norm O-P Amp Site1 Site2 Delta-0 (ms) Dist (cm) Vel (m/s) Norm Vel (m/s)  Right Median Motor (Abd Poll Brev)  32C  Wrist    3.5 <4.2 *4.9 >5 Elbow Wrist 4.2 23.5 56 >50  Elbow    7.7  4.1          Right Ulnar Motor (Abd Dig Min)  32.2C  Wrist    2.3 <4.2 8.8 >3 B Elbow Wrist 3.5 20.0 57 >53  B Elbow    5.8  11.5  A Elbow B Elbow 1.4 11.0 79 >53  A Elbow    7.2  11.6          EMG   Side Muscle Nerve Root Ins Act Fibs Psw Amp Dur Poly Recrt Int Bruna Comment  Right Abd Poll Brev Median C8-T1 Nml Nml Nml Nml Nml 0 Nml Nml   Right 1stDorInt Ulnar C8-T1 Nml Nml Nml Nml Nml 0 Nml Nml   Right PronatorTeres Median C6-7 Nml Nml Nml Nml Nml 0 Nml Nml   Right Biceps Musculocut C5-6 Nml Nml Nml Nml Nml 0 Nml Nml   Right Deltoid Axillary C5-6 Nml Nml Nml Nml Nml 0 Nml Nml     Nerve Conduction Studies Anti Sensory Left/Right Comparison   Stim Site L Lat (ms) R Lat (ms) L-R Lat (ms) L Amp (V) R Amp (V) L-R Amp (%) Site1 Site2 L Vel (m/s) R Vel (m/s) L-R Vel (m/s)  Median Acr Palm Anti Sensory (2nd Digit)  31.4C  Wrist  *4.0   27.6  Wrist Palm     Palm  *2.3   7.4  Radial Anti Sensory (Base 1st Digit)  31.9C  Wrist  1.9   15.7  Wrist Base 1st Digit     Ulnar Anti Sensory (5th Digit)  32C  Wrist  3.1   *8.5  Wrist 5th Digit  45    Motor Left/Right Comparison   Stim Site L Lat (ms) R Lat (ms) L-R Lat (ms) L Amp (mV) R Amp (mV) L-R Amp (%) Site1 Site2 L Vel (m/s) R Vel (m/s) L-R Vel (m/s)  Median Motor (Abd Poll Brev)  32C  Wrist  3.5   *4.9  Elbow Wrist  56   Elbow  7.7   4.1        Ulnar Motor (Abd Dig Min)  32.2C  Wrist  2.3   8.8  B Elbow Wrist  57   B Elbow  5.8   11.5  A Elbow B Elbow  79   A Elbow  7.2   11.6           Waveforms:

## 2024-06-05 NOTE — Patient Instructions (Signed)
 Julie Ryan  06/05/2024     @PREFPERIOPPHARMACY @   Your procedure is scheduled on  06/12/2024.   Report to [facility] at 0600 A.M.   Call this number if you have problems the morning of surgery:  [phone]  If you experience any cold or flu symptoms such as cough, fever, chills, shortness of breath, etc. between now and your scheduled surgery, please notify us  at the above number.   Remember:  Do not eat after midnight.    You may drink clear liquids until 0330 am on 06/12/2024.       Clear liquids allowed are:                    Water , Carbonated beverages (diabetics please choose diet or no sugar options), Black Coffee Only (No creamer, milk or cream, including half & half and powdered creamer), and Clear Sports drink (No red color; diabetics please choose diet or no sugar options)    Take these medicines the morning of surgery with A SIP OF WATER          amlodipine , escitalopram , metoprolol , tramadol (if needed).    Do not wear jewelry, make-up or nail polish, including gel polish,  artificial nails, or any other type of covering on natural nails (fingers and  toes).  Do not wear lotions, powders, or perfumes, or deodorant.  Do not shave 48 hours prior to surgery.  Men may shave face and neck.  Do not bring valuables to the hospital.  [facility] is not responsible for any belongings or valuables.  Contacts, dentures or bridgework may not be worn into surgery.  Leave your suitcase in the car.  After surgery it may be brought to your room.  For patients admitted to the hospital, discharge time will be determined by your treatment team.  Patients discharged the day of surgery will not be allowed to drive home and must have someone with them for 24 hours.    Special instructions:   DO NOT smoke tobacco or vape for 24 hours before your procedure.  Please read over the following fact sheets that you were given. Pain Booklet, Coughing and Deep  Breathing, Blood Transfusion Information, Total Joint Packet, MRSA Information, Surgical Site Infection Prevention, Anesthesia Post-op Instructions, and Care and Recovery After Surgery      Total Knee Replacement Surgery: What to Know After After a total knee replacement, it's common to have: Redness, pain, and swelling at the cut from surgery area. This is also called the incision area. Stiffness. Discomfort. A small amount of blood or clear fluid coming from your cut. Follow these instructions at home: Medicines Take your medicines only as told. You may need to take steps to help treat or prevent trouble pooping (constipation), such as: Taking medicines to help you poop. Eating foods high in fiber, like beans, whole grains, and fresh fruits and vegetables. Drinking more fluids as told. Ask your health care provider if it's safe to drive or use machines while taking your medicine. Bathing Do not take baths, swim, or use a hot tub until you're told it's OK. Ask if you can shower. If you bandage isn't waterproof: Do not let it get wet. Cover it when you take a bath or shower. Use a cover that doesn't let any water  in. Incision care Take care of your cut from surgery as told. Make sure you: Wash your hands with soap and water  for  at least 20 seconds before and after you change your bandage. If you can't use soap and water , use hand sanitizer. Change your bandage. Leave stitches or skin glue alone. Leave tape strips alone unless you're told to take them off. You may trim the edges of the tape strips if they curl up. Check the area around your cut every day for signs of infection. Check for: More redness, swelling, or pain. More fluid or blood. Warmth. Pus or a bad smell.  Activity Rest as told. Get up to take short walks at least every 2 hours during the day. This helps you breathe better and keeps your blood flowing. Ask for help if you feel weak or unsteady. Follow instructions  from your provider about using a walker, crutches, or a cane. You may use your legs to support your body weight as told by your provider. Follow instructions about how much weight you may safely support on your affected leg. You may be shown how to get out of a bed and chair and how to go up and down stairs. You will first do this with a walker, crutches, or a cane. Once you are able to walk without a limp, you may stop using a walker, crutches, or a cane, as told by your provider. Do exercises as told by your provider to help restore your strength and knee movement. Exercises will start as soon as possible after your surgery. You'll work with a physical therapist for up to a few months after your surgery. Avoid high-impact activities. Avoid running, jumping rope, and doing jumping jacks. Do not play contact sports until your provider approves. Ask what things are safe for you to do at home. Ask when you can go back to work or school. Managing pain, stiffness, and swelling  Use ice or an ice pack as told. Put ice in a plastic bag or use the cold flow pad or cold therapy unit that you were given. Place a towel between your skin and the bag or device. Leave the ice on for 20 minutes, 2-3 times a day. If your skin turns red, take off the ice right away to prevent skin damage. The risk of damage is higher if you can't feel pain, heat, or cold. Move your toes often to reduce stiffness and swelling. Raise your leg above the level of your heart while you are sitting or lying down. Use several pillows to keep your leg straight. Do not put a pillow just under the knee. If the knee is bent for a long time, this may lead to stiffness. Wear an elastic knee support as told by your provider. Safety To help prevent falls, keep floors clear. Put things that you may need within easy reach. Wear an apron or tool belt with pockets for carrying things. This leaves your hands free to help with your balance. Ask  your provider when it is safe to drive. General instructions Wear compression stockings to reduce swelling and prevent blood clots in your legs. Keep doing breathing exercises as told. This helps prevent lung infection. Do not smoke, vape, or use nicotine or tobacco. Do not have dental work or cleanings for at least 3 months. Ask your provider if you need to take antibiotics before you have dental work or have your teeth cleaned. Tell your dentist about your new joint. Keep all follow-up visits. Your provider will need to check how your knee is healing. Contact a health care provider if: You have a fever or  chills. You have a cough or feel short of breath. You have very bad pain and medicine is not helping your pain. You have any signs of infection. You fall. You have increased swelling, redness, and calf pain. Your cut from surgery breaks open after your stitches or staples are taken out. Get help right away if: You have trouble breathing. You have a fast and irregular heartbeat. You have chest pain. These symptoms may be an emergency. Call 911 right away. Do not wait to see if the symptoms will go away. Do not drive yourself to the hospital. This information is not intended to replace advice given to you by your health care provider. Make sure you discuss any questions you have with your health care provider. Document Revised: 04/21/2023 Document Reviewed: 11/25/2022 Elsevier Patient Education  2024 Else[employer].General Anesthesia, Adult, Care After The following information offers guidance on how to care for yourself after your procedure. Your health care provider may also give you more specific instructions. If you have problems or questions, contact your health care provider. What can I expect after the procedure? After the procedure, it is common for people to: Have pain or discomfort at the IV site. Have nausea or vomiting. Have a sore throat or hoarseness. Have trouble  concentrating. Feel cold or chills. Feel weak, sleepy, or tired (fatigue). Have soreness and body aches. These can affect parts of the body that were not involved in surgery. Follow these instructions at home: For the time period you were told by your health care provider:  Rest. Do not participate in activities where you could fall or become injured. Do not drive or use machinery. Do not drink alcohol. Do not take sleeping pills or medicines that cause drowsiness. Do not make important decisions or sign legal documents. Do not take care of children on your own. General instructions Drink enough fluid to keep your urine pale yellow. If you have sleep apnea, surgery and certain medicines can increase your risk for breathing problems. Follow instructions from your health care provider about wearing your sleep device: Anytime you are sleeping, including during daytime naps. While taking prescription pain medicines, sleeping medicines, or medicines that make you drowsy. Return to your normal activities as told by your health care provider. Ask your health care provider what activities are safe for you. Take over-the-counter and prescription medicines only as told by your health care provider. Do not use any products that contain nicotine or tobacco. These products include cigarettes, chewing tobacco, and vaping devices, such as e-cigarettes. These can delay incision healing after surgery. If you need help quitting, ask your health care provider. Contact a health care provider if: You have nausea or vomiting that does not get better with medicine. You vomit every time you eat or drink. You have pain that does not get better with medicine. You cannot urinate or have bloody urine. You develop a skin rash. You have a fever. Get help right away if: You have trouble breathing. You have chest pain. You vomit blood. These symptoms may be an emergency. Get help right away. Call 911. Do not wait  to see if the symptoms will go away. Do not drive yourself to the hospital. Summary After the procedure, it is common to have a sore throat, hoarseness, nausea, vomiting, or to feel weak, sleepy, or fatigue. For the time period you were told by your health care provider, do not drive or use machinery. Get help right away if you have difficulty breathing,  have chest pain, or vomit blood. These symptoms may be an emergency. This information is not intended to replace advice given to you by your health care provider. Make sure you discuss any questions you have with your health care provider. Document Revised: 09/18/2021 Document Reviewed: 09/18/2021 Elsevier Patient Education  2024 Elsevier Inc.How to Use Chlorhexidine  at Home in the Shower Chlorhexidine  gluconate (CHG) is a germ-killing (antiseptic) wash that's used to clean the skin. It can get rid of the germs that normally live on the skin and can keep them away for about 24 hours. If you're having surgery, you may be told to shower with CHG at home the night before surgery. This can help lower your risk for infection. To use CHG wash in the shower, follow the steps below. Supplies needed: CHG body wash. Clean washcloth. Clean towel. How to use CHG in the shower Follow these steps unless you're told to use CHG in a different way: Start the shower. Use your normal soap and shampoo to wash your face and hair. Turn off the shower or move out of the shower stream. Pour CHG onto a clean washcloth. Do not use any type of brush or rough sponge. Start at your neck, washing your body down to your toes. Make sure you: Wash the part of your body where the surgery will be done for at least 1 minute. Do not scrub. Do not use CHG on your head or face unless your health care provider tells you to. If it gets into your ears or eyes, rinse them well with water . Do not wash your genitals with CHG. Wash your back and under your arms. Make sure to wash skin  folds. Let the CHG sit on your skin for 1-2 minutes or as long as told. Rinse your entire body in the shower, including all body creases and folds. Turn off the shower. Dry off with a clean towel. Do not put anything on your skin afterward, such as powder, lotion, or perfume. Put on clean clothes or pajamas. If it's the night before surgery, sleep in clean sheets. General tips Use CHG only as told, and follow the instructions on the label. Use the full amount of CHG as told. This is often one bottle. Do not smoke and stay away from flames after using CHG. Your skin may feel sticky after using CHG. This is normal. The sticky feeling will go away as the CHG dries. Do not use CHG: If you have a chlorhexidine  allergy or have reacted to chlorhexidine  in the past. On open wounds or areas of skin that have broken skin, cuts, or scrapes. On babies younger than 75 months of age. Contact a health care provider if: You have questions about using CHG. Your skin gets irritated or itchy. You have a rash after using CHG. You swallow any CHG. Call your local poison control center [phone] in the [place] Your eyes itch badly, or they become very red or swollen. Your hearing changes. You have trouble seeing. If you can't reach your provider, go to an urgent care or emergency room. Do not drive yourself. Get help right away if: You have swelling or tingling in your mouth or throat. You make high-pitched whistling sounds when you breathe, most often when you breathe out (wheeze). You have trouble breathing. These symptoms may be an emergency. Call 911 right away. Do not wait to see if the symptoms will go away. Do not drive yourself to the hospital. This information is not intended  to replace advice given to you by your health care provider. Make sure you discuss any questions you have with your health care provider. Document Revised: 01/04/2023 Document Reviewed: 12/31/2021 Elsevier Patient  Education  2024 Elsevier Inc.How to Use an Incentive Spirometer An incentive spirometer is a tool that measures how well you are filling your lungs with each breath. Learning to take long, deep breaths using this tool can help you keep your lungs clear and active. This may help to reverse or lessen your chance of developing breathing (pulmonary) problems, especially infection. You may be asked to use a spirometer: After a surgery. If you have a lung problem or a history of smoking. After a long period of time when you have been unable to move or be active. If the spirometer includes an indicator to show the highest number that you have reached, your health care provider or respiratory therapist will help you set a goal. Keep a log of your progress as told by your health care provider. What are the risks? Breathing too quickly may cause dizziness or cause you to pass out. Take your time so you do not get dizzy or light-headed. If you are in pain, you may need to take pain medicine before doing incentive spirometry. It is harder to take a deep breath if you are having pain. How to use your incentive spirometer  Sit up on the edge of your bed or on a chair. Hold the incentive spirometer so that it is in an upright position. Before you use the spirometer, breathe out normally. Place the mouthpiece in your mouth. Make sure your lips are closed tightly around it. Breathe in slowly and as deeply as you can through your mouth, causing the piston or the ball to rise toward the top of the chamber. Hold your breath for 3-5 seconds, or for as long as possible. If the spirometer includes a coach indicator, use this to guide you in breathing. Slow down your breathing if the indicator goes above the marked areas. Remove the mouthpiece from your mouth and breathe out normally. The piston or ball will return to the bottom of the chamber. Rest for a few seconds, then repeat the steps 10 or more times. Take your  time and take a few normal breaths between deep breaths so that you do not get dizzy or light-headed. Do this every 1-2 hours when you are awake. If the spirometer includes a goal marker to show the highest number you have reached (best effort), use this as a goal to work toward during each repetition. After each set of 10 deep breaths, cough a few times. This will help to make sure that your lungs are clear. If you have an incision on your chest or abdomen from surgery, place a pillow or a rolled-up towel firmly against the incision when you cough. This can help to reduce pain while taking deep breaths and coughing. General tips When you are able to get out of bed: Walk around often. Continue to take deep breaths and cough in order to clear your lungs. Keep using the incentive spirometer until your health care provider says it is okay to stop using it. If you have been in the hospital, you may be told to keep using the spirometer at home. Contact a health care provider if: You are having difficulty using the spirometer. You have trouble using the spirometer as often as instructed. Your pain medicine is not giving enough relief for you to use  the spirometer as told. You have a fever. Get help right away if: You develop shortness of breath. You develop a cough with bloody mucus from the lungs. You have fluid or blood coming from an incision site after you cough. Summary An incentive spirometer is a tool that can help you learn to take long, deep breaths to keep your lungs clear and active. You may be asked to use a spirometer after a surgery, if you have a lung problem or a history of smoking, or if you have been inactive for a long period of time. Use your incentive spirometer as instructed every 1-2 hours while you are awake. If you have an incision on your chest or abdomen, place a pillow or a rolled-up towel firmly against your incision when you cough. This will help to reduce pain. Get  help right away if you have shortness of breath, you cough up bloody mucus, or blood comes from your incision when you cough. This information is not intended to replace advice given to you by your health care provider. Make sure you discuss any questions you have with your health care provider. Document Revised: 04/29/2023 Document Reviewed: 04/29/2023 Elsevier Patient Education  2024 [employer].

## 2024-06-06 ENCOUNTER — Encounter (HOSPITAL_COMMUNITY)
Admission: RE | Admit: 2024-06-06 | Discharge: 2024-06-06 | Disposition: A | Source: Ambulatory Visit | Attending: Orthopedic Surgery

## 2024-06-06 ENCOUNTER — Encounter (HOSPITAL_COMMUNITY): Payer: Self-pay

## 2024-06-06 VITALS — BP 126/61 | HR 74 | Resp 18 | Ht 66.0 in | Wt 224.2 lb

## 2024-06-06 DIAGNOSIS — I10 Essential (primary) hypertension: Secondary | ICD-10-CM

## 2024-06-06 DIAGNOSIS — Z01818 Encounter for other preprocedural examination: Secondary | ICD-10-CM

## 2024-06-06 DIAGNOSIS — M1711 Unilateral primary osteoarthritis, right knee: Secondary | ICD-10-CM

## 2024-06-06 DIAGNOSIS — E119 Type 2 diabetes mellitus without complications: Secondary | ICD-10-CM

## 2024-06-06 LAB — CBC WITH DIFFERENTIAL/PLATELET
Abs Immature Granulocytes: 0.06 K/uL (ref 0.00–0.07)
Basophils Absolute: 0 K/uL (ref 0.0–0.1)
Basophils Relative: 0 %
Eosinophils Absolute: 0.2 K/uL (ref 0.0–0.5)
Eosinophils Relative: 2 %
HCT: 40.5 % (ref 36.0–46.0)
Hemoglobin: 13 g/dL (ref 12.0–15.0)
Immature Granulocytes: 1 %
Lymphocytes Relative: 23 %
Lymphs Abs: 2 K/uL (ref 0.7–4.0)
MCH: 30.3 pg (ref 26.0–34.0)
MCHC: 32.1 g/dL (ref 30.0–36.0)
MCV: 94.4 fL (ref 80.0–100.0)
Monocytes Absolute: 0.5 K/uL (ref 0.1–1.0)
Monocytes Relative: 6 %
Neutro Abs: 5.8 K/uL (ref 1.7–7.7)
Neutrophils Relative %: 68 %
Platelets: 353 K/uL (ref 150–400)
RBC: 4.29 MIL/uL (ref 3.87–5.11)
RDW: 12.8 % (ref 11.5–15.5)
WBC: 8.6 K/uL (ref 4.0–10.5)
nRBC: 0 % (ref 0.0–0.2)

## 2024-06-06 LAB — HEMOGLOBIN A1C
Hgb A1c MFr Bld: 5.4 % (ref 4.8–5.6)
Mean Plasma Glucose: 108 mg/dL

## 2024-06-06 LAB — PREPARE RBC (CROSSMATCH)

## 2024-06-06 LAB — BASIC METABOLIC PANEL WITH GFR
Anion gap: 12 (ref 5–15)
BUN: 14 mg/dL (ref 6–20)
CO2: 25 mmol/L (ref 22–32)
Calcium: 9.2 mg/dL (ref 8.9–10.3)
Chloride: 102 mmol/L (ref 98–111)
Creatinine, Ser: 0.67 mg/dL (ref 0.44–1.00)
GFR, Estimated: >60 mL/min (ref >60)
Glucose, Bld: 65 mg/dL — ABNORMAL LOW (ref 70–99)
Potassium: 4.1 mmol/L (ref 3.5–5.1)
Sodium: 139 mmol/L (ref 135–145)

## 2024-06-06 LAB — SURGICAL PCR SCREEN
MRSA, PCR: NEGATIVE
Staphylococcus aureus: NEGATIVE

## 2024-06-11 ENCOUNTER — Telehealth: Payer: Self-pay | Admitting: Orthopedic Surgery

## 2024-06-11 NOTE — Telephone Encounter (Signed)
 Dr. Areatha Julie Ryan - Julie Ryan lvm stating she's supposed to have surgery in the morning and her papers from the hospital say to be there at 6am, she wants to confirm that is the correct time for her arrival.  [phone]

## 2024-06-11 NOTE — Telephone Encounter (Signed)
 Called pt and lvm advising

## 2024-06-12 ENCOUNTER — Encounter (HOSPITAL_COMMUNITY): Payer: Self-pay | Admitting: Anesthesiology

## 2024-06-12 ENCOUNTER — Observation Stay (HOSPITAL_COMMUNITY)
Admission: RE | Admit: 2024-06-12 | Discharge: 2024-06-14 | Disposition: A | Attending: Orthopedic Surgery | Admitting: Orthopedic Surgery

## 2024-06-12 ENCOUNTER — Ambulatory Visit (HOSPITAL_COMMUNITY)

## 2024-06-12 ENCOUNTER — Encounter (HOSPITAL_COMMUNITY): Payer: Self-pay | Admitting: Orthopedic Surgery

## 2024-06-12 ENCOUNTER — Other Ambulatory Visit: Payer: Self-pay

## 2024-06-12 ENCOUNTER — Encounter (HOSPITAL_COMMUNITY): Admission: RE | Disposition: A | Payer: Self-pay | Source: Home / Self Care | Attending: Orthopedic Surgery

## 2024-06-12 ENCOUNTER — Ambulatory Visit (HOSPITAL_COMMUNITY): Payer: Self-pay | Admitting: Anesthesiology

## 2024-06-12 DIAGNOSIS — M1711 Unilateral primary osteoarthritis, right knee: Principal | ICD-10-CM | POA: Diagnosis present

## 2024-06-12 HISTORY — PX: TOTAL KNEE ARTHROPLASTY: SHX125

## 2024-06-12 LAB — CBC
HCT: 38.7 % (ref 36.0–46.0)
Hemoglobin: 12.5 g/dL (ref 12.0–15.0)
MCH: 30.3 pg (ref 26.0–34.0)
MCHC: 32.3 g/dL (ref 30.0–36.0)
MCV: 93.7 fL (ref 80.0–100.0)
Platelets: 331 K/uL (ref 150–400)
RBC: 4.13 MIL/uL (ref 3.87–5.11)
RDW: 12.8 % (ref 11.5–15.5)
WBC: 10.1 K/uL (ref 4.0–10.5)
nRBC: 0 % (ref 0.0–0.2)

## 2024-06-12 LAB — GLUCOSE, CAPILLARY
Glucose-Capillary: 91 mg/dL (ref 70–99)
Glucose-Capillary: 91 mg/dL (ref 70–99)

## 2024-06-12 LAB — CREATININE, SERUM
Creatinine, Ser: 0.82 mg/dL (ref 0.44–1.00)
GFR, Estimated: >60 mL/min (ref >60)

## 2024-06-12 SURGERY — ARTHROPLASTY, KNEE, TOTAL
Anesthesia: General | Site: Knee | Laterality: Right

## 2024-06-12 MED ORDER — ORAL CARE MOUTH RINSE: 15.0000 mL | Freq: Once | OROMUCOSAL | Status: DC

## 2024-06-12 MED ORDER — METOCLOPRAMIDE HCL 10 MG PO TABS: 5.0000 mg | ORAL_TABLET | Freq: Three times a day (TID) | ORAL | Status: DC | PRN

## 2024-06-12 MED ORDER — OXYCODONE HCL 5 MG PO TABS
5.0000 mg | ORAL_TABLET | ORAL | Status: DC | PRN
  Administered 2024-06-12: 10 mg via ORAL
  Administered 2024-06-13 (×2): 5 mg via ORAL
  Filled 2024-06-12: qty 1
  Filled 2024-06-12: qty 2
  Filled 2024-06-12: qty 1

## 2024-06-12 MED ORDER — AMLODIPINE BESYLATE 5 MG PO TABS
7.5000 mg | ORAL_TABLET | Freq: Every day | ORAL | Status: DC
  Filled 2024-06-12: qty 2

## 2024-06-12 MED ORDER — ALBUTEROL SULFATE HFA 108 (90 BASE) MCG/ACT IN AERS: 2.0000 | INHALATION_SPRAY | Freq: Four times a day (QID) | RESPIRATORY_TRACT | Status: DC | PRN

## 2024-06-12 MED ORDER — ONDANSETRON HCL 4 MG PO TABS: 4.0000 mg | ORAL_TABLET | Freq: Four times a day (QID) | ORAL | Status: DC | PRN

## 2024-06-12 MED ORDER — PANTOPRAZOLE SODIUM 40 MG PO TBEC
40.0000 mg | DELAYED_RELEASE_TABLET | Freq: Every day | ORAL | Status: DC
  Administered 2024-06-12 – 2024-06-13 (×2): 40 mg via ORAL
  Filled 2024-06-12 (×2): qty 1

## 2024-06-12 MED ORDER — PROPOFOL 500 MG/50ML IV EMUL
INTRAVENOUS | Status: DC | PRN
  Administered 2024-06-12: 50 ug/kg/min via INTRAVENOUS

## 2024-06-12 MED ORDER — METOCLOPRAMIDE HCL 5 MG/ML IJ SOLN: 5.0000 mg | Freq: Three times a day (TID) | INTRAMUSCULAR | Status: DC | PRN

## 2024-06-12 MED ORDER — PROPOFOL 500 MG/50ML IV EMUL
INTRAVENOUS | Status: AC
  Filled 2024-06-12: qty 100

## 2024-06-12 MED ORDER — PHENYLEPHRINE 80 MCG/ML (10ML) SYRINGE FOR IV PUSH (FOR BLOOD PRESSURE SUPPORT)
PREFILLED_SYRINGE | INTRAVENOUS | Status: DC | PRN
  Administered 2024-06-12: 160 ug via INTRAVENOUS

## 2024-06-12 MED ORDER — FENTANYL CITRATE (PF) 100 MCG/2ML IJ SOLN
INTRAMUSCULAR | Status: AC
  Filled 2024-06-12: qty 2

## 2024-06-12 MED ORDER — CEFAZOLIN SODIUM-DEXTROSE 2-4 GM/100ML-% IV SOLN
INTRAVENOUS | Status: AC
  Filled 2024-06-12: qty 100

## 2024-06-12 MED ORDER — CHLORHEXIDINE GLUCONATE 0.12 % MT SOLN: 15.0000 mL | Freq: Once | OROMUCOSAL | Status: DC

## 2024-06-12 MED ORDER — PHENOL 1.4 % MT LIQD: 1.0000 | OROMUCOSAL | Status: DC | PRN

## 2024-06-12 MED ORDER — ENOXAPARIN SODIUM 30 MG/0.3ML IJ SOSY
30.0000 mg | PREFILLED_SYRINGE | Freq: Two times a day (BID) | INTRAMUSCULAR | Status: DC
  Administered 2024-06-13 (×2): 30 mg via SUBCUTANEOUS
  Filled 2024-06-12 (×2): qty 0.3

## 2024-06-12 MED ORDER — SACCHAROMYCES BOULARDII 250 MG PO CAPS: 250.0000 mg | ORAL_CAPSULE | Freq: Every day | ORAL | Status: DC

## 2024-06-12 MED ORDER — METOPROLOL SUCCINATE ER 25 MG PO TB24
25.0000 mg | ORAL_TABLET | Freq: Every day | ORAL | Status: DC
  Filled 2024-06-12: qty 1

## 2024-06-12 MED ORDER — ALUM & MAG HYDROXIDE-SIMETH 200-200-20 MG/5ML PO SUSP: 30.0000 mL | ORAL | Status: DC | PRN

## 2024-06-12 MED ORDER — FENTANYL CITRATE (PF) 50 MCG/ML IJ SOSY: 25.0000 ug | PREFILLED_SYRINGE | INTRAMUSCULAR | Status: DC | PRN

## 2024-06-12 MED ORDER — GABAPENTIN 300 MG PO CAPS
300.0000 mg | ORAL_CAPSULE | Freq: Every day | ORAL | Status: DC
  Administered 2024-06-13: 300 mg via ORAL
  Filled 2024-06-12: qty 1

## 2024-06-12 MED ORDER — ACETAMINOPHEN 500 MG PO TABS
1000.0000 mg | ORAL_TABLET | Freq: Four times a day (QID) | ORAL | Status: DC
  Administered 2024-06-12: 1000 mg via ORAL
  Filled 2024-06-12: qty 2

## 2024-06-12 MED ORDER — VITAMIN D (ERGOCALCIFEROL) 1.25 MG (50000 UNIT) PO CAPS: 50000.0000 [IU] | ORAL_CAPSULE | ORAL | Status: DC

## 2024-06-12 MED ORDER — OXYCODONE HCL 5 MG PO TABS
10.0000 mg | ORAL_TABLET | ORAL | Status: DC | PRN
  Administered 2024-06-12 (×2): 15 mg via ORAL
  Administered 2024-06-13: 10 mg via ORAL
  Filled 2024-06-12: qty 3
  Filled 2024-06-12: qty 2
  Filled 2024-06-12: qty 3

## 2024-06-12 MED ORDER — ONDANSETRON HCL 4 MG/2ML IJ SOLN
INTRAMUSCULAR | Status: DC | PRN
  Administered 2024-06-12: 4 mg via INTRAVENOUS

## 2024-06-12 MED ORDER — MENTHOL 3 MG MT LOZG: 1.0000 | LOZENGE | OROMUCOSAL | Status: DC | PRN

## 2024-06-12 MED ORDER — LACTATED RINGERS IV SOLN: INTRAVENOUS | Status: DC

## 2024-06-12 MED ORDER — DEXMEDETOMIDINE HCL IN NACL 80 MCG/20ML IV SOLN
INTRAVENOUS | Status: DC | PRN
  Administered 2024-06-12 (×2): 20 ug via INTRAVENOUS

## 2024-06-12 MED ORDER — POVIDONE-IODINE 10 % EX SWAB
2.0000 | Freq: Once | CUTANEOUS | Status: AC
  Administered 2024-06-12: 2 via TOPICAL

## 2024-06-12 MED ORDER — ESCITALOPRAM OXALATE 10 MG PO TABS
10.0000 mg | ORAL_TABLET | Freq: Every day | ORAL | Status: DC
  Administered 2024-06-13: 10 mg via ORAL
  Filled 2024-06-12: qty 1

## 2024-06-12 MED ORDER — TRANEXAMIC ACID-NACL 1000-0.7 MG/100ML-% IV SOLN
1000.0000 mg | Freq: Once | INTRAVENOUS | Status: AC
  Administered 2024-06-12: 1000 mg via INTRAVENOUS
  Filled 2024-06-12: qty 100

## 2024-06-12 MED ORDER — BIOTIN 2.5 MG PO TABS: ORAL_TABLET | Freq: Every day | ORAL | Status: DC

## 2024-06-12 MED ORDER — TRANEXAMIC ACID-NACL 1000-0.7 MG/100ML-% IV SOLN
1000.0000 mg | INTRAVENOUS | Status: AC
  Administered 2024-06-12: 1000 mg via INTRAVENOUS

## 2024-06-12 MED ORDER — BISACODYL 5 MG PO TBEC: 5.0000 mg | DELAYED_RELEASE_TABLET | Freq: Every day | ORAL | Status: DC | PRN

## 2024-06-12 MED ORDER — ONDANSETRON HCL 4 MG/2ML IJ SOLN: 4.0000 mg | Freq: Four times a day (QID) | INTRAMUSCULAR | Status: DC | PRN

## 2024-06-12 MED ORDER — ROPIVACAINE HCL 5 MG/ML IJ SOLN
INTRAMUSCULAR | Status: AC
  Filled 2024-06-12: qty 30

## 2024-06-12 MED ORDER — AMLODIPINE BESYLATE 5 MG PO TABS: 5.0000 mg | ORAL_TABLET | Freq: Every day | ORAL | Status: DC

## 2024-06-12 MED ORDER — CYCLOBENZAPRINE HCL 10 MG PO TABS
10.0000 mg | ORAL_TABLET | Freq: Every day | ORAL | Status: DC
  Administered 2024-06-12 – 2024-06-13 (×2): 10 mg via ORAL
  Filled 2024-06-12 (×2): qty 1

## 2024-06-12 MED ORDER — KETOROLAC TROMETHAMINE 30 MG/ML IJ SOLN
15.0000 mg | Freq: Once | INTRAMUSCULAR | Status: AC
  Administered 2024-06-12: 15 mg via INTRAVENOUS
  Filled 2024-06-12: qty 1

## 2024-06-12 MED ORDER — ALBUTEROL SULFATE (2.5 MG/3ML) 0.083% IN NEBU: 2.5000 mg | INHALATION_SOLUTION | Freq: Four times a day (QID) | RESPIRATORY_TRACT | Status: DC | PRN

## 2024-06-12 MED ORDER — BUPIVACAINE-MELOXICAM ER 200-6 MG/7ML IJ SOLN
INTRAMUSCULAR | Status: AC
  Filled 2024-06-12: qty 2

## 2024-06-12 MED ORDER — 0.9 % SODIUM CHLORIDE (POUR BTL) OPTIME
TOPICAL | Status: DC | PRN
  Administered 2024-06-12: 1000 mL

## 2024-06-12 MED ORDER — TRANEXAMIC ACID-NACL 1000-0.7 MG/100ML-% IV SOLN
INTRAVENOUS | Status: AC
  Filled 2024-06-12: qty 100

## 2024-06-12 MED ORDER — OXYCODONE HCL 5 MG PO TABS: 5.0000 mg | ORAL_TABLET | Freq: Once | ORAL | Status: DC | PRN

## 2024-06-12 MED ORDER — FENTANYL CITRATE (PF) 100 MCG/2ML IJ SOLN
INTRAMUSCULAR | Status: DC | PRN
  Administered 2024-06-12 (×2): 50 ug via INTRAVENOUS

## 2024-06-12 MED ORDER — BUPIVACAINE-MELOXICAM ER 200-6 MG/7ML IJ SOLN
INTRAMUSCULAR | Status: DC | PRN
  Administered 2024-06-12: 400 mg

## 2024-06-12 MED ORDER — SODIUM CHLORIDE 0.9 % IV SOLN: INTRAVENOUS | Status: AC

## 2024-06-12 MED ORDER — MIDAZOLAM HCL (PF) 2 MG/2ML IJ SOLN
INTRAMUSCULAR | Status: DC | PRN
  Administered 2024-06-12: 2 mg via INTRAVENOUS

## 2024-06-12 MED ORDER — ACETAMINOPHEN 500 MG PO TABS
1000.0000 mg | ORAL_TABLET | Freq: Four times a day (QID) | ORAL | Status: AC
  Administered 2024-06-12 – 2024-06-13 (×3): 1000 mg via ORAL
  Filled 2024-06-12 (×3): qty 2

## 2024-06-12 MED ORDER — TRAMADOL HCL 50 MG PO TABS
50.0000 mg | ORAL_TABLET | Freq: Four times a day (QID) | ORAL | Status: DC
  Administered 2024-06-12 – 2024-06-14 (×6): 50 mg via ORAL
  Filled 2024-06-12 (×6): qty 1

## 2024-06-12 MED ORDER — DEXAMETHASONE SOD PHOSPHATE PF 10 MG/ML IJ SOLN
8.0000 mg | Freq: Once | INTRAMUSCULAR | Status: AC
  Administered 2024-06-12: 8 mg via INTRAVENOUS
  Filled 2024-06-12: qty 0.8

## 2024-06-12 MED ORDER — MIDAZOLAM HCL 2 MG/2ML IJ SOLN
INTRAMUSCULAR | Status: AC
  Filled 2024-06-12: qty 2

## 2024-06-12 MED ORDER — ONDANSETRON HCL 4 MG/2ML IJ SOLN: 4.0000 mg | Freq: Once | INTRAMUSCULAR | Status: DC | PRN

## 2024-06-12 MED ORDER — POLYETHYLENE GLYCOL 3350 17 G PO PACK: 17.0000 g | PACK | Freq: Every day | ORAL | Status: DC | PRN

## 2024-06-12 MED ORDER — PROPOFOL 10 MG/ML IV BOLUS
INTRAVENOUS | Status: AC
  Filled 2024-06-12: qty 20

## 2024-06-12 MED ORDER — FLUTICASONE FUROATE-VILANTEROL 100-25 MCG/ACT IN AEPB
1.0000 | INHALATION_SPRAY | Freq: Every day | RESPIRATORY_TRACT | Status: DC
  Administered 2024-06-13 – 2024-06-14 (×2): 1 via RESPIRATORY_TRACT
  Filled 2024-06-12: qty 28

## 2024-06-12 MED ORDER — ACETAMINOPHEN 325 MG PO TABS: 325.0000 mg | ORAL_TABLET | Freq: Four times a day (QID) | ORAL | Status: DC | PRN

## 2024-06-12 MED ORDER — PHENYLEPHRINE 80 MCG/ML (10ML) SYRINGE FOR IV PUSH (FOR BLOOD PRESSURE SUPPORT)
PREFILLED_SYRINGE | INTRAVENOUS | Status: AC
  Filled 2024-06-12: qty 10

## 2024-06-12 MED ORDER — DEXMEDETOMIDINE HCL IN NACL 80 MCG/20ML IV SOLN
INTRAVENOUS | Status: AC
  Filled 2024-06-12: qty 20

## 2024-06-12 MED ORDER — GABAPENTIN 300 MG PO CAPS
300.0000 mg | ORAL_CAPSULE | Freq: Once | ORAL | Status: AC
  Administered 2024-06-12: 300 mg via ORAL
  Filled 2024-06-12: qty 3

## 2024-06-12 MED ORDER — B COMPLEX-C PO TABS
1.0000 | ORAL_TABLET | Freq: Every day | ORAL | Status: DC
  Administered 2024-06-13: 1 via ORAL
  Filled 2024-06-12: qty 1

## 2024-06-12 MED ORDER — DEXAMETHASONE SOD PHOSPHATE PF 10 MG/ML IJ SOLN
INTRAMUSCULAR | Status: DC | PRN
  Administered 2024-06-12: 10 mg via INTRAVENOUS

## 2024-06-12 MED ORDER — HYDROMORPHONE HCL 1 MG/ML IJ SOLN: 0.5000 mg | INTRAMUSCULAR | Status: DC | PRN

## 2024-06-12 MED ORDER — CEFAZOLIN SODIUM-DEXTROSE 2-4 GM/100ML-% IV SOLN
2.0000 g | Freq: Four times a day (QID) | INTRAVENOUS | Status: AC
  Administered 2024-06-12 (×2): 2 g via INTRAVENOUS
  Filled 2024-06-12 (×2): qty 100

## 2024-06-12 MED ORDER — SODIUM CHLORIDE 0.9 % IR SOLN
Status: DC | PRN
  Administered 2024-06-12: 3000 mL

## 2024-06-12 MED ORDER — DOCUSATE SODIUM 100 MG PO CAPS
100.0000 mg | ORAL_CAPSULE | Freq: Two times a day (BID) | ORAL | Status: DC
  Administered 2024-06-12 – 2024-06-13 (×3): 100 mg via ORAL
  Filled 2024-06-12 (×3): qty 1

## 2024-06-12 MED ORDER — OXYCODONE HCL 5 MG/5ML PO SOLN: 5.0000 mg | Freq: Once | ORAL | Status: DC | PRN

## 2024-06-12 MED ORDER — CEFAZOLIN SODIUM-DEXTROSE 2-4 GM/100ML-% IV SOLN
2.0000 g | INTRAVENOUS | Status: AC
  Administered 2024-06-12: 2 g via INTRAVENOUS

## 2024-06-12 SURGICAL SUPPLY — 56 items
ATTUNE PSFEM RTSZ4 NARCEM KNEE (Femur) IMPLANT
BASEPLATE TIB CMT FB PCKT SZ4 (Stem) IMPLANT
BLADE SAGITTAL 25.0X1.27X90 (BLADE) ×1 IMPLANT
BLADE SAW SAG 90X13X1.27 (BLADE) IMPLANT
BLADE SAW SGTL 11.0X1.19X90.0M (BLADE) ×1 IMPLANT
BNDG COMPR ESMARK 6X3 LF (GAUZE/BANDAGES/DRESSINGS) ×1 IMPLANT
CEMENT HV SMART SET (Cement) ×2 IMPLANT
CLOTH BEACON ORANGE TIMEOUT ST (SAFETY) ×1 IMPLANT
COOLER ICEMAN CLASSIC (MISCELLANEOUS) ×1 IMPLANT
COUNTER NDL MAGNETIC 40 RED (SET/KITS/TRAYS/PACK) ×1 IMPLANT
COVER LIGHT HANDLE STERIS (MISCELLANEOUS) ×2 IMPLANT
CUFF TOURN SGL QUICK 42 (TOURNIQUET CUFF) IMPLANT
DRAPE BACK TABLE (DRAPES) ×1 IMPLANT
DRAPE EXTREMITY T 121X128X90 (DISPOSABLE) ×1 IMPLANT
DRAPE HALF SHEET 40X57 (DRAPES) IMPLANT
DRESSING AQUACEL AG ADV 3.5X12 (MISCELLANEOUS) ×1 IMPLANT
DURAPREP 26ML APPLICATOR (WOUND CARE) ×2 IMPLANT
ELECTRODE REM PT RTRN 9FT ADLT (ELECTROSURGICAL) ×1 IMPLANT
GLOVE BIOGEL PI IND STRL 7.0 (GLOVE) ×6 IMPLANT
GLOVE BIOGEL PI IND STRL 8.5 (GLOVE) ×1 IMPLANT
GLOVE SKINSENSE STRL SZ8.0 LF (GLOVE) ×1 IMPLANT
GLOVE SURG SS PI 6.5 STRL IVOR (GLOVE) IMPLANT
GLOVE SURG SS PI 7.0 STRL IVOR (GLOVE) IMPLANT
GOWN STRL REUS W/TWL LRG LVL3 (GOWN DISPOSABLE) ×3 IMPLANT
GOWN STRL REUS W/TWL XL LVL3 (GOWN DISPOSABLE) ×1 IMPLANT
HOOD PEEL AWAY T7 (MISCELLANEOUS) ×4 IMPLANT
INSERT TIBIAL ATTUNE POST 10MM (Knees) IMPLANT
INST SET MAJOR BONE (KITS) ×1 IMPLANT
KIT TURNOVER KIT A (KITS) ×1 IMPLANT
MANIFOLD NEPTUNE II (INSTRUMENTS) ×1 IMPLANT
MARKER SKIN DUAL TIP RULER LAB (MISCELLANEOUS) ×1 IMPLANT
PACK TOTAL JOINT (CUSTOM PROCEDURE TRAY) ×1 IMPLANT
PAD ARMBOARD POSITIONER FOAM (MISCELLANEOUS) ×1 IMPLANT
PAD COLD SHLDR SM WRAP-ON (PAD) ×1 IMPLANT
PATELLA MEDIAL ATTUN 35MM KNEE (Knees) IMPLANT
PENCIL SMOKE EVACUATOR COATED (MISCELLANEOUS) ×1 IMPLANT
PILLOW KNEE EXTENSION 0 DEG (MISCELLANEOUS) ×1 IMPLANT
PIN STEINMAN FIXATION KNEE (PIN) ×1 IMPLANT
POSITIONER HEAD 8X9X4 ADT (SOFTGOODS) ×1 IMPLANT
SET BASIN LINEN APH (SET/KITS/TRAYS/PACK) ×1 IMPLANT
SET HNDPC FAN SPRY TIP SCT (DISPOSABLE) ×1 IMPLANT
SOL .9 NS 3000ML IRR UROMATIC (IV SOLUTION) ×1 IMPLANT
SOLN 0.9% NACL POUR BTL 1000ML (IV SOLUTION) ×1 IMPLANT
SOLN STERILE WATER BTL 1000 ML (IV SOLUTION) ×2 IMPLANT
SOLUTION IRRIG SURGIPHOR (IV SOLUTION) ×1 IMPLANT
STAPLER VISISTAT (STAPLE) ×1 IMPLANT
SUT BRALON NAB BRD #1 30IN (SUTURE) ×1 IMPLANT
SUT MNCRL 0 VIOLET CTX 36 (SUTURE) ×1 IMPLANT
SUT MON AB 0 CT1 (SUTURE) ×1 IMPLANT
SYR BULB IRRIG 60ML STRL (SYRINGE) ×1 IMPLANT
TOWEL OR 17X26 4PK STRL BLUE (TOWEL DISPOSABLE) ×1 IMPLANT
TOWER CARTRIDGE SMART MIX (DISPOSABLE) ×1 IMPLANT
TRAY FOLEY MTR SLVR 16FR STAT (SET/KITS/TRAYS/PACK) ×1 IMPLANT
TUBE SUCTION HIGH CAP CLEAR NV (SUCTIONS) IMPLANT
TUBING CONNECTOR 18X5MM (MISCELLANEOUS) ×1 IMPLANT
YANKAUER SUCT 12FT TUBE ARGYLE (SUCTIONS) ×1 IMPLANT

## 2024-06-12 NOTE — Progress Notes (Signed)
 Assisted Dr Johnnette Litter with right, adductor canal block. Side rails up, monitors on throughout procedure. See vital signs in flow sheet. Tolerated Procedure well.

## 2024-06-12 NOTE — Progress Notes (Signed)
 Block in process.

## 2024-06-12 NOTE — Brief Op Note (Signed)
 06/12/2024  10:06 AM  PATIENT:  Julie Ryan  58 y.o. female  PRE-OPERATIVE DIAGNOSIS:  osteoarthritis right knee  POST-OPERATIVE DIAGNOSIS:  osteoarthritis right knee  PROCEDURE:  Procedure(s): ARTHROPLASTY, KNEE, TOTAL (Right)  SURGEON:  Surgeons and Role:    DEWAINE Margrette Taft FORBES, MD - Primary  PHYSICIAN ASSISTANT:   ASSISTANTS: cynthia wrenn and harley cox    ANESTHESIA:   spinal  EBL:  100 mL   BLOOD ADMINISTERED:none  DRAINS: none   LOCAL MEDICATIONS USED:  OTHER zinrelef  SPECIMEN:  No Specimen  DISPOSITION OF SPECIMEN:  N/A  COUNTS:  YES  TOURNIQUET:   Total Tourniquet Time Documented: Thigh (Right) - 59 minutes Thigh (Right) - 36 minutes Total: Thigh (Right) - 95 minutes   DICTATION: .Nechama Dictation  PLAN OF CARE: Admit for overnight observation  PATIENT DISPOSITION:  PACU - hemodynamically stable.   Delay start of Pharmacological VTE agent (>24hrs) due to surgical blood loss or risk of bleeding: yes

## 2024-06-12 NOTE — TOC Initial Note (Addendum)
 Transition of Care [facility]) - Initial/Assessment Note    Patient Details  Name: Julie Ryan MRN: [id number] Date of Birth: 1965/08/16  Transition of Care [facility]) CM/SW Contact:    Noreen KATHEE Pinal, LCSWA Phone Number: 06/12/2024, 2:11 PM  Clinical Narrative:                  CSW spoke with patient at bedside regarding [facility] agency that has been arranged though Dr. Margrette office - [facility] and her SDOH. Patient reports that her daughter and sister lives with her and that she is independent. She reports that they provide her transportation needs. Patient agreeable to resources being added for Food and Housing insecurity. Resources were added through [employer]. CSW updated Delon with [facility] regarding patient agreement to have services. CSW signing off.     Expected Discharge Plan: Home w Home Health Services Barriers to Discharge: Continued Medical Work up   Patient Goals and CMS Choice Patient states their goals for this hospitalization and ongoing recovery are:: return back home CMS Medicare.gov Compare Post Acute Care list provided to:: Patient Choice offered to / list presented to : Patient      Expected Discharge Plan and Services In-house Referral: Clinical Social Work     Living arrangements for the past 2 months: Single Family Home                           HH Arranged: PT HH Agency: [facility] Date [facility] Agency Contacted: 06/12/24 Time HH Agency Contacted: 1402 Representative spoke with at [facility] Agency: Delon  Prior Living Arrangements/Services Living arrangements for the past 2 months: Single Family Home Lives with:: Adult Children, Siblings Patient language and need for interpreter reviewed:: Yes Do you feel safe going back to the place where you live?: Yes      Need for Family Participation in Patient Care: Yes (Comment) Care giver support system in place?: No (comment) Current home services: DME Criminal Activity/Legal Involvement Pertinent  to Current Situation/Hospitalization: No - Comment as needed  Activities of Daily Living   ADL Screening (condition at time of admission) Independently performs ADLs?: Yes (appropriate for developmental age) Is the patient deaf or have difficulty hearing?: No Does the patient have difficulty seeing, even when wearing glasses/contacts?: No Does the patient have difficulty concentrating, remembering, or making decisions?: No  Permission Sought/Granted      Share Information with NAME: Caitlynn     Permission granted to share info w Relationship: Patient     Emotional Assessment Appearance:: Appears stated age Attitude/Demeanor/Rapport: Engaged Affect (typically observed): Accepting Orientation: : Oriented to Self, Oriented to Place, Oriented to  Time, Oriented to Situation   Psych Involvement: No (comment)  Admission diagnosis:  Primary osteoarthritis of right knee [M17.11] 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 factor (HCC) 08/28/2020   High risk medication use 08/28/2020   Neck pain 08/19/2020   Dermatomycosis 07/01/2020   Hiatal hernia 07/17/2019   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 [place] [address] Phone: [phone] Fax: [phone]     Social Drivers of Health (SDOH) Social History: SDOH Screenings   Food Insecurity: Food Insecurity Present (06/12/2024)  Housing: High Risk (06/12/2024)  Transportation Needs: No Transportation Needs (06/12/2024)  Utilities: Not At Risk (06/12/2024)  Alcohol Screen: Low Risk  (09/21/2023)  Depression (PHQ2-9): Medium Risk (02/17/2024)  Financial Resource Strain: Medium Risk (05/11/2024)  Physical Activity: Inactive (05/11/2024)  Social Connections: Socially Isolated (06/12/2024)  Stress: No Stress Concern Present (05/11/2024)  Tobacco Use: Medium Risk (06/12/2024)   SDOH Interventions:     Readmission Risk Interventions     No data to display

## 2024-06-12 NOTE — H&P (Signed)
 TOTAL KNEE ADMISSION H&P  Patient is being admitted for right total knee arthroplasty.  Subjective:  Chief Complaint:right knee pain.  HPI: Julie Ryan, 58 y.o. female, has a history of pain and functional disability in the right knee due to arthritis and has failed non-surgical conservative treatments for greater than 12 weeks to includeNSAID's and/or analgesics, corticosteriod injections, and weight reduction as appropriate.  Onset of symptoms was gradual, starting 4 years ago with gradually worsening course since that time.  Patient currently rates pain in the right knee(s) at 9 out of 10 with activity. Patient has night pain, worsening of pain with activity and weight bearing, pain that interferes with activities of daily living, pain with passive range of motion, crepitus, and joint swelling.  Patient has evidence of subchondral cysts, subchondral sclerosis, periarticular osteophytes, and joint space narrowing by imaging studies. There is no active infection.  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 factor (HCC) 08/28/2020   High risk medication use 08/28/2020   Neck pain 08/19/2020   Dermatomycosis 07/01/2020   Hiatal hernia 07/17/2019   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   Endometrial polyp 08/09/2017   Fibroids 08/09/2017   Dyslipidemia 06/27/2017   Leg edema 03/08/2017   GAD (generalized anxiety disorder) 06/01/2016   Metabolic syndrome X 07/08/2013   Erosive esophagitis 06/25/2013    GERD (gastroesophageal reflux disease) 03/22/2013   Left knee pain 03/22/2013   Snoring 08/05/2011   Vitamin D  deficiency 03/21/2011   Allergic rhinitis 09/20/2010   MORBID OBESITY 02/11/2010   Essential hypertension 02/11/2010   Past Medical History:  Diagnosis Date   Angio-edema    Anxiety    Arthritis    Phreesia 08/19/2020   Carpal tunnel syndrome of right wrist    Chronic knee pain    Depression    Diabetes mellitus without complication [facility])    Endometrial polyp 08/09/2017   will get appt with Dr Jayne   Fibroids 08/09/2017   Has multiple small fibroids    GERD (gastroesophageal reflux disease)    Hypertension    Neuropathy    Obesity    Prediabetes    Seizures (HCC)    1 seizure as a chold; unknown etiology and has never been on meds   Sleep apnea     Past Surgical History:  Procedure Laterality Date   APPENDECTOMY     CARPAL TUNNEL RELEASE Right 08/01/2015   Procedure: RIGHT CARPAL TUNNEL RELEASE;  Surgeon: Taft FORBES Minerva, MD;  Location: [facility];  Service: Orthopedics;  Laterality: Right;   CERVICAL POLYPECTOMY  09/14/2017   Procedure: ENDOMETRIAL POLYPECTOMY;  Surgeon: Jayne Vonn DEL, MD;  Location: [facility];  Service: Gynecology;;   COLONOSCOPY N/A 06/15/2017   Procedure: COLONOSCOPY;  Surgeon: Golda Claudis PENNER, MD;  Location: [facility];  Service: Endoscopy;  Laterality: N/A;  830   ENDOMETRIAL ABLATION N/A 09/14/2017   Procedure: ENDOMETRIAL ABLATION( Minerva);  Surgeon: Jayne Vonn DEL, MD;  Location: [facility];  Service: Gynecology;  Laterality: N/A;   ESOPHAGOGASTRODUODENOSCOPY N/A  05/25/2013   Procedure: ESOPHAGOGASTRODUODENOSCOPY (EGD);  Surgeon: Claudis RAYMOND Rivet, MD;  Location: [facility];  Service: Endoscopy;  Laterality: N/A;  220   GASTRIC BYPASS     HYSTEROSCOPY WITH D & C N/A 09/14/2017   Procedure: DILATATION AND CURETTAGE /HYSTEROSCOPY;  Surgeon: Jayne Vonn DEL, MD;  Location: [facility];  Service: Gynecology;  Laterality: N/A;   TOTAL KNEE  ARTHROPLASTY Left 05/11/2023   Procedure: TOTAL KNEE ARTHROPLASTY;  Surgeon: Margrette Taft BRAVO, MD;  Location: [facility];  Service: Orthopedics;  Laterality: Left;   TUBAL LIGATION      Current Facility-Administered Medications  Medication Dose Route Frequency Provider Last Rate Last Admin   ceFAZolin  (ANCEF ) 2-4 GM/100ML-% IVPB            ceFAZolin  (ANCEF ) IVPB 2g/100 mL premix  2 g Intravenous On Call to OR Margrette Taft BRAVO, MD       povidone-iodine  10 % swab 2 Application  2 Application Topical Once Margrette Taft BRAVO, MD       tranexamic acid  (CYKLOKAPRON ) 1000MG /164mL IVPB            tranexamic acid  (CYKLOKAPRON ) IVPB 1,000 mg  1,000 mg Intravenous To OR Margrette Taft BRAVO, MD       Allergies  Allergen Reactions   Butrans [Buprenorphine] Dermatitis    Reported 05/2024   Codeine  Nausea Only and Other (See Comments)    Dizziness    Effexor  Xr [Venlafaxine  Hcl Er] Other (See Comments)    Makes patient feel on edge    Fluoxetine  Other (See Comments)    States that med caused memory problems and crying spells    Hydrocodone  Nausea Only and Other (See Comments)    Dizziness    Methotrexate  And Trimetrexate     Caused elevation in liver function tests   Latex Itching and Rash    Social History   Tobacco Use   Smoking status: Former    Current packs/day: 0.00    Average packs/day: 0.5 packs/day for 10.0 years (5.0 ttl pk-yrs)    Types: Cigarettes    Start date: 05/13/2009    Quit date: 05/14/2019    Years since quitting: 5.0    Passive exposure: Current   Smokeless tobacco: Never  Substance Use Topics   Alcohol use: No    Alcohol/week: 0.0 standard drinks of alcohol    Family History  Problem Relation Age of Onset   Hypertension Mother    Heart disease Mother    Diabetes Mother    Diabetes Sister    Multiple sclerosis Sister    Lupus Sister    Heart disease Sister    CVA Sister    Depression Sister    Diabetes Brother    Hypertension Brother    Other Son         brain tumor   Hypertension Daughter    Diabetes Sister      Review of Systems  Constitutional:  Negative for fever.  Respiratory:  Negative for chest tightness.   Cardiovascular:  Negative for chest pain.    Objective:  Physical Exam Vitals and nursing note reviewed.  Constitutional:      General: She is not in acute distress.    Appearance: Normal appearance. She is normal weight. She is not ill-appearing, toxic-appearing or diaphoretic.  HENT:     Head: Normocephalic and atraumatic.     Right Ear: External ear normal.     Left Ear: External ear normal.     Nose:  Nose normal. No congestion or rhinorrhea.     Mouth/Throat:     Pharynx: Oropharynx is clear. No oropharyngeal exudate.  Eyes:     General: No scleral icterus.       Right eye: No discharge.        Left eye: No discharge.     Extraocular Movements: Extraocular movements intact.     Conjunctiva/sclera: Conjunctivae normal.     Pupils: Pupils are equal, round, and reactive to light.  Cardiovascular:     Rate and Rhythm: Normal rate.     Pulses: Normal pulses.  Pulmonary:     Effort: Pulmonary effort is normal.     Breath sounds: No stridor. No wheezing or rhonchi.  Abdominal:     General: Abdomen is flat. There is no distension.  Musculoskeletal:     Cervical back: Neck supple.     Right knee: Swelling, deformity, effusion and crepitus present. No erythema, ecchymosis or lacerations. Decreased range of motion. Tenderness present over the medial joint line. No LCL laxity, MCL laxity, ACL laxity or PCL laxity. Abnormal alignment. Normal pulse.  Skin:    General: Skin is warm and dry.     Capillary Refill: Capillary refill takes less than 2 seconds.  Neurological:     General: No focal deficit present.     Mental Status: She is alert and oriented to person, place, and time. Mental status is at baseline.     Cranial Nerves: No cranial nerve deficit.     Sensory: No sensory deficit.     Motor: No weakness.      Coordination: Coordination normal.     Gait: Gait normal.     Deep Tendon Reflexes: Reflexes normal.  Psychiatric:        Mood and Affect: Mood normal.        Behavior: Behavior normal.        Thought Content: Thought content normal.        Judgment: Judgment normal.     Vital signs in last 24 hours: Temp:  [98 F (36.7 C)] 98 F (36.7 C) (12/09 0645) Pulse Rate:  [69] 69 (12/09 0645) Resp:  [12] 12 (12/09 0645) BP: (132)/(64) 132/64 (12/09 0645) SpO2:  [98 %] 98 % (12/09 0645) Weight:  [101.7 kg] 101.7 kg (12/09 0645)  Labs:   Estimated body mass index is 36.19 kg/m as calculated from the following:   Height as of this encounter: 5' 6 (1.676 m).   Weight as of this encounter: 101.7 kg.   Imaging Review Plain radiographs demonstrate severe degenerative joint disease of the right knee(s). The overall alignment issignificant varus. The bone quality appears to be good for age and reported activity level.      Assessment/Plan:  End stage arthritis, right knee   The patient history, physical examination, clinical judgment of the provider and imaging studies are consistent with end stage degenerative joint disease of the right knee(s) and total knee arthroplasty is deemed medically necessary. The treatment options including medical management, injection therapy arthroscopy and arthroplasty were discussed at length. The risks and benefits of total knee arthroplasty were presented and reviewed. The risks due to aseptic loosening, infection, stiffness, patella tracking problems, thromboembolic complications and other imponderables were discussed. The patient acknowledged the explanation, agreed to proceed with the plan and consent was signed. Patient is being admitted for inpatient treatment for surgery, pain control, PT, OT, prophylactic antibiotics, VTE prophylaxis, progressive ambulation and ADL's and discharge planning. The patient is planning  to be discharged home with home  health services

## 2024-06-12 NOTE — Transfer of Care (Signed)
 Immediate Anesthesia Transfer of Care Note  Patient: Julie Ryan  Procedure(s) Performed: ARTHROPLASTY, KNEE, TOTAL (Right: Knee)  Patient Location: PACU  Anesthesia Type:Spinal  Level of Consciousness: awake, alert , oriented, and patient cooperative  Airway & Oxygen Therapy: Patient Spontanous Breathing  Post-op Assessment: Report given to RN, Post -op Vital signs reviewed and stable, and Patient moving all extremities X 4  Post vital signs: Reviewed and stable  Last Vitals:  Vitals Value Taken Time  BP 120/75 06/12/24 10:12  Temp 97.7 1014  Pulse 76 06/12/24 10:14  Resp 20 1014  SpO2 96 % 06/12/24 10:14  Vitals shown include unfiled device data.  Last Pain:  Vitals:   06/12/24 0645  TempSrc: Oral  PainSc: 6       Patients Stated Pain Goal: 8 (06/12/24 0645)  Complications: No notable events documented.

## 2024-06-12 NOTE — Anesthesia Procedure Notes (Signed)
 Spinal  Start time: 06/12/2024 7:40 AM End time: 06/12/2024 7:50 AM Reason for block: surgical anesthesia Staffing Performed: resident/CRNA  Anesthesiologist: Kendell Yvonna PARAS, MD Resident/CRNA: Cordella Elvie HERO, CRNA Performed by: Cordella Elvie HERO, CRNA Authorized by: Kendell Yvonna PARAS, MD   Preanesthetic Checklist Completed: patient identified, IV checked, site marked, risks and benefits discussed, surgical consent, monitors and equipment checked, pre-op evaluation and timeout performed Spinal Block Patient position: sitting Prep: ChloraPrep Patient monitoring: heart rate, cardiac monitor, continuous pulse ox and blood pressure Approach: midline Location: L3-4 Injection technique: single-shot Needle Needle type: Sprotte  Needle gauge: 24 G Needle length: 10 cm Assessment Sensory level: T6 Additional Notes Prepped and draped in sterile fashion, easy atraumatic spinal placement.

## 2024-06-12 NOTE — Anesthesia Postprocedure Evaluation (Signed)
 Anesthesia Post Note  Patient: Julie Ryan  Procedure(s) Performed: ARTHROPLASTY, KNEE, TOTAL (Right: Knee)  Patient location during evaluation: Phase II Anesthesia Type: Spinal Level of consciousness: awake Pain management: pain level controlled Vital Signs Assessment: post-procedure vital signs reviewed and stable Respiratory status: spontaneous breathing and respiratory function stable Cardiovascular status: blood pressure returned to baseline and stable Postop Assessment: no headache and no apparent nausea or vomiting Anesthetic complications: no Comments: Late entry   No notable events documented.   Last Vitals:  Vitals:   06/12/24 1015 06/12/24 1023  BP: 118/72   Pulse: 76 68  Resp: 18 15  Temp: 36.5 C   SpO2: 96% 100%    Last Pain:  Vitals:   06/12/24 1015  TempSrc:   PainSc: 0-No pain                 Yvonna JINNY Bosworth

## 2024-06-12 NOTE — Plan of Care (Signed)
  Problem: Acute Rehab PT Goals(only PT should resolve) Goal: Pt Will Go Supine/Side To Sit Outcome: Progressing Flowsheets (Taken 06/12/2024 1558) Pt will go Supine/Side to Sit:  with modified independence  with supervision Goal: Patient Will Transfer Sit To/From Stand Outcome: Progressing Flowsheets (Taken 06/12/2024 1558) Patient will transfer sit to/from stand:  with modified independence  with supervision Goal: Pt Will Transfer Bed To Chair/Chair To Bed Outcome: Progressing Flowsheets (Taken 06/12/2024 1558) Pt will Transfer Bed to Chair/Chair to Bed:  with modified independence  with supervision Goal: Pt Will Ambulate Outcome: Progressing Flowsheets (Taken 06/12/2024 1558) Pt will Ambulate:  100 feet  with modified independence  with supervision  with rolling walker   4:00 PM, 06/12/24 Lynwood Music, MPT Physical Therapist with [facility] [phone] office [phone] mobile phone

## 2024-06-12 NOTE — Anesthesia Preprocedure Evaluation (Signed)
 Anesthesia Evaluation  Patient identified by MRN, date of birth, ID band Patient awake    Reviewed: Allergy & Precautions, H&P , NPO status , Patient's Chart, lab work & pertinent test results, reviewed documented beta blocker date and time   Airway Mallampati: II  TM Distance: >3 FB Neck ROM: full    Dental no notable dental hx.    Pulmonary neg pulmonary ROS, shortness of breath, sleep apnea , pneumonia, former smoker   Pulmonary exam normal breath sounds clear to auscultation       Cardiovascular Exercise Tolerance: Good hypertension, negative cardio ROS  Rhythm:regular Rate:Normal     Neuro/Psych Seizures -,  PSYCHIATRIC DISORDERS Anxiety Depression     Neuromuscular disease negative neurological ROS  negative psych ROS   GI/Hepatic negative GI ROS, Neg liver ROS, hiatal hernia, PUD,GERD  ,,  Endo/Other  negative endocrine ROSdiabetes    Renal/GU negative Renal ROS  negative genitourinary   Musculoskeletal   Abdominal   Peds  Hematology negative hematology ROS (+)   Anesthesia Other Findings   Reproductive/Obstetrics negative OB ROS                              Anesthesia Physical Anesthesia Plan  ASA: 3  Anesthesia Plan: General and Spinal   Post-op Pain Management:    Induction:   PONV Risk Score and Plan: Propofol  infusion  Airway Management Planned:   Additional Equipment:   Intra-op Plan:   Post-operative Plan:   Informed Consent: I have reviewed the patients History and Physical, chart, labs and discussed the procedure including the risks, benefits and alternatives for the proposed anesthesia with the patient or authorized representative who has indicated his/her understanding and acceptance.     Dental Advisory Given  Plan Discussed with: CRNA  Anesthesia Plan Comments:         Anesthesia Quick Evaluation

## 2024-06-12 NOTE — Interval H&P Note (Signed)
 History and Physical Interval Note:  06/12/2024 7:24 AM  Julie Ryan  has presented today for surgery, with the diagnosis of osteoarthritis right knee.  The various methods of treatment have been discussed with the patient and family. After consideration of risks, benefits and other options for treatment, the patient has consented to  Procedure(s): ARTHROPLASTY, KNEE, TOTAL (Right) as a surgical intervention.  The patient's history has been reviewed, patient examined, no change in status, stable for surgery.  I have reviewed the patient's chart and labs.  Questions were answered to the patient's satisfaction.     [name]

## 2024-06-12 NOTE — Op Note (Signed)
 Dictation for total knee replacement  Orthopaedic Surgery Operative Note (CSN: [id number])  Julie Ryan  1965/11/03 Date of Surgery: 06/12/2024   Diagnoses:  osteoarthritis right knee preop and postop diagnosis same  Procedure: Right total knee arthroplasty   TXA [used]   Operative Finding Varus deformity, multiple osteophytes, medial compartment femur and tibia grade 4, ACL PCL intact, medial meniscus intact, lateral meniscus intact, patellofemoral joint grade 3  Bone cuts Distal femur -9  Proximal tibia -11  Patella -9  Post-Op Diagnosis: Same Surgeons:Primary: Margrette Taft BRAVO, MD Assistants: Montie Eke and Kristi Free Location: [facility] OR ROOM 4 Anesthesia: Spinal Antibiotics: Ancef  2 g Tourniquet time: 2 tourniquet times see nurses note Estimated Blood Loss: Minimal Complications: None Specimens: None   Implants: Implant Name Type Inv. Item Serial No. Manufacturer Lot No. LRB No. Used Action  CEMENT HV SMART SET - [id number] Cement CEMENT HV SMART SET  [employer] [id number] Right 1 Implanted  INSERT TIBIAL ATTUNE POST - [id number] Knees INSERT TIBIAL ATTUNE POST  [employer]PU[employer] [id number] Right 1 Implanted  BASEPLATE TIB CMT FB PCKT SZ4 - [id number] Stem BASEPLATE TIB CMT FB PCKT SZ4  [employer]PU[employer] [id number] Right 1 Implanted  PATELLA MEDIAL ATTUN KNEE - [id number] Knees PATELLA MEDIAL ATTUN KNEE  DEPU[employer]TH[employer] [id number] Right 1 Implanted  ATTUNE PSFEM RTSZ4 NARCEM KNEE - [id number] Femur ATTUNE PSFEM RTSZ4 NARCEM KNEE  [employer] [id number] Right 1 Implanted     Indications for Surgery:   Disabling pain of the right knee which did not improve despite nonoperative measures. The benefits and risks of operative and nonoperative management were discussed prior to surgery with patient/guardian(s) and informed consent form was completed.  While all risks cannot be anticipated, specific risks including infection, need for  additional surgery, stiffness, postop pain, infection, implant removal, loosening, infection requiring amputation, deep vein thrombosis, pulmonary embolus were discussed.   Procedure:    Details of surgery: The patient was identified by 2 approved identification mechanisms. The operative extremity was evaluated and found to be acceptable for surgical treatment today. The chart was reviewed. The surgical site was confirmed and marked over the right knee   Saphenous nerve block was planned and completed in the PACU yes [Y/N]   The patient was taken to the operating room and given 2 g of Ancef  as the antibiotic.  This is consistent with the SCIP protocol.  The patient was given the following anesthetic: Spinal  The patient was then placed supine on the operating table. A Foley catheter was inserted. The operative extremity was prepped and draped sterilely from the toes to the groin.  Timeout was executed confirming the patient's name, surgical site, antibiotic administration, x-rays available, and implants available.  The operative limb,  was exsanguinated with a six-inch Esmarch and the tourniquet was inflated to 280 mmHg.  A straight midline incision was made over the right KNEE and taken down to the extensor mechanism. A medial arthrotomy was performed. The patella was everted and the patellofemoral soft tissue was released, along with the patellar fat pad.  The anterior cruciate ligament and PCL were resected. The anterior horns of the lateral and medial meniscus were resected. The medial soft tissue sleeve was elevated to the mid coronal plane.   A three-eighths inch drill bit was used to enter the femoral canal which was decompressed with suction and irrigation until clear.  The distal femoral cutting guide was set for 9 mm distal resection,  5valgus alignment,  for a right knee. The distal femur was resected and checked for flatness and thickness.  The measured thickness was 7 mm so we  took an additional 2 mm  The attune sizing femoral guide was placed and the femur was preliminarily sized to a size 3.5.    The external alignment guide for the tibial resection was then applied to the distal and proximal tibia and set for 3 degree posterior slope (I did have to move the alignment rod to allow for more slope than the guide was giving due to the size of the leg)  Along with 9 MM resection  from the lateral however I decided at this point to take 3 from the medial side which was the deficient side.  Rotational alignment was set using the malleolus, the tibial tubercle and the tibial spines. The proximal tibia was resected along with  residual menisci. The tibia was sized using a base plate to a size 4.    The extension gap was checked.  The spacer block which balanced extension gap was a size 7 The femur size was rechecked and measured a 3.5 but I set the block for a size 4 The femoral cutting block was placed.  The spacer block which balanced extension gap was placed under the 4-in-1 femoral cutting block (size 7).  An assessment was made at this point as to whether the block should be moved up or down or left in place.  The block was left in place  Rotation was assessed using Whitesides line and the epicondyles.  Once I was satisfied with the spacer block collateral ligament retractors were placed and I completed the 4 distal femoral cuts   The extension gap was rechecked with the size spacer block the flexion and extension gaps were now balanced and I proceeded to cut the notch in the femur. The correct sized notch cutting guide for the femur was then applied and the notch cut was made.   Trial reduction was completed using size 4 femur 4 tibia 7 poly trial implants.  With a 7 poly in place the flexion gap seems loose I increased the size of the poly to 8 and then 10 which seem to give the better overall fit.  Patella tracking was normal  We then skeletonized the patella.  It measured 23 in thickness and the patellar resection was set for 9.5 millimeters. the patellar resection was completed. The patella diameter measured 35. We then drilled the peg holes for the patella.    The proximal tibia was prepared using the size 4 base plate.  Tourniquet was released and then reinflated  Thorough irrigation was performed using saline  and the bone was dried and prepared for cement. The cement was mixed on the back table using third generation preparation techniques  The implants were then cemented in place and excess cement was removed. The cement was allowed to cure.  Normal saline irrigation followed by Surgipor irrigation followed by normal saline irrigation  Any excess bone fragments and cement were removed.  Closure was done with the following sutures  #1 Surgilon in interrupted fashion followed by injection of Zen relief and then closure of the arthrotomy was completed with #1 Surgilon  2 layers of 0 Monocryl 1 interrupted 1 running  Skin approximation was performed using staples  A sterile dressing was applied, TED hose were placed on the operative extremity followed by Cryo/Cuff.  The patient was taken recovery room in stable condition  Postop plan:  Weightbearing as tolerated CPM machine Immediate physical therapy Discharge tomorrow if stable

## 2024-06-12 NOTE — Discharge Instructions (Addendum)
 [email or web address] ( Food stamps/ EBT)  Social Services: Address  [address] [place], [place] [address]  Mailing Address: [address] [place] [address]  Phone: [phone] Hours Monday through Friday 8 a.m. to 5 p.m.

## 2024-06-12 NOTE — Evaluation (Signed)
 Physical Therapy Evaluation Patient Details Name: Julie Ryan MRN: [id number] DOB: 10-30-1965 Today's Date: 06/12/2024   RIGHT KNEE ROM: 0 - 88 degrees AMBULATION DISTANCE: 45 feet using RW with Min/CGA assist   History of Present Illness  Julie Ryan is a 58 y/o female, s/p Right TKA on 06/2024, with the diagnosis of osteoarthritis right knee.  Clinical Impression  Patient required assistance for moving RLE during bed mobility, tolerated up to 0 - 88 degrees right knee flexion with severe pain at end range and demonstrated fair/good return for ambulating in room, hallway with limited weightbearing/heel to toe stepping on RLE. Patient tolerated sitting up in chair after therapy with RLE dangling - family members present in room. Patient will benefit from continued skilled physical therapy in hospital and recommended venue below to increase strength, balance, endurance for safe ADLs and gait.          If plan is discharge home, recommend the following: A little help with walking and/or transfers;A little help with bathing/dressing/bathroom;Help with stairs or ramp for entrance;Assistance with cooking/housework;Assist for transportation   Can travel by private vehicle        Equipment Recommendations None recommended by PT  Recommendations for Other Services       Functional Status Assessment Patient has had a recent decline in their functional status and demonstrates the ability to make significant improvements in function in a reasonable and predictable amount of time.     Precautions / Restrictions Precautions Precautions: Fall Recall of Precautions/Restrictions: Intact Restrictions Weight Bearing Restrictions Per Provider Order: Yes RLE Weight Bearing Per Provider Order: Weight bearing as tolerated      Mobility  Bed Mobility Overal bed mobility: Needs Assistance Bed Mobility: Supine to Sit     Supine to sit: Min assist     General bed mobility comments:  required assistance for movig RLE due to increasing knee pain    Transfers Overall transfer level: Needs assistance Equipment used: Rolling walker (2 wheels) Transfers: Sit to/from Stand, Bed to chair/wheelchair/BSC Sit to Stand: Min assist   Step pivot transfers: Contact guard assist       General transfer comment: had diffiuclty completing sit to stands due to increasing right knee pain    Ambulation/Gait Ambulation/Gait assistance: Contact guard assist, Min assist Gait Distance (Feet): 45 Feet Assistive device: Rolling walker (2 wheels) Gait Pattern/deviations: Decreased step length - right, Decreased step length - left, Decreased stride length, Antalgic, Decreased stance time - right Gait velocity: decreased     General Gait Details: slow labored movement with fair carryover for right heel to toe stepping due to increasing right knee pain, no loss of balance and limited mostly due to fatigue/left knee pain  Stairs            Wheelchair Mobility     Tilt Bed    Modified Rankin (Stroke Patients Only)       Balance Overall balance assessment: Needs assistance Sitting-balance support: Feet supported, No upper extremity supported Sitting balance-Leahy Scale: Fair Sitting balance - Comments: fair/good seated at EOB   Standing balance support: Reliant on assistive device for balance, During functional activity, Bilateral upper extremity supported Standing balance-Leahy Scale: Fair Standing balance comment: using RW                             Pertinent Vitals/Pain Pain Assessment Pain Assessment: 0-10 Pain Score: 8  Pain Location: right knee Pain Descriptors /  Indicators: Crying, Discomfort, Sharp Pain Intervention(s): Limited activity within patient's tolerance, Monitored during session, Repositioned    Home Living Family/patient expects to be discharged to:: Private residence Living Arrangements: Other relatives;Children Available Help at  Discharge: Family;Available 24 hours/day Type of Home: House Home Access: Stairs to enter   [employer] of Steps: 2   Home Layout: One level Home Equipment: [employer] (2 wheels);Cane - single point      Prior Function Prior Level of Function : Independent/Modified Independent;Driving             Mobility Comments: Community ambulation without AD, drives ADLs Comments: Independent     Extremity/Trunk Assessment   Upper Extremity Assessment Upper Extremity Assessment: Overall WFL for tasks assessed    Lower Extremity Assessment Lower Extremity Assessment: Generalized weakness;RLE deficits/detail RLE Deficits / Details: grossly -4/5 RLE: Unable to fully assess due to pain RLE Sensation: WNL RLE Coordination: WNL    Cervical / Trunk Assessment Cervical / Trunk Assessment: Normal  Communication   Communication Communication: No apparent difficulties    Cognition Arousal: Alert Behavior During Therapy: WFL for tasks assessed/performed   PT - Cognitive impairments: No apparent impairments                         Following commands: Intact       Cueing Cueing Techniques: Verbal cues     General Comments      Exercises Total Joint Exercises Ankle Circles/Pumps: Supine, 10 reps, Right, Strengthening, AROM Quad Sets: Supine, 10 reps, Right, Strengthening, AROM Short Arc Quad: AAROM, Strengthening, Right, 10 reps, Supine, AROM Heel Slides: AROM, AAROM, Strengthening, Right, 10 reps, Supine Goniometric ROM: 0 - 88 degrees   Assessment/Plan    PT Assessment Patient needs continued PT services  PT Problem List Decreased strength;Decreased range of motion;Decreased activity tolerance;Decreased balance;Decreased mobility;Pain       PT Treatment Interventions DME instruction;Gait training;Stair training;Functional mobility training;Therapeutic activities;Therapeutic exercise;Balance training;Patient/family education    PT Goals  (Current goals can be found in the Care Plan section)  Acute Rehab PT Goals Patient Stated Goal: return home with family to assist PT Goal Formulation: With patient/family Time For Goal Achievement: 06/14/24 Potential to Achieve Goals: Good    Frequency BID     Co-evaluation               AM-PAC PT 6 Clicks Mobility  Outcome Measure Help needed turning from your back to your side while in a flat bed without using bedrails?: A Little Help needed moving from lying on your back to sitting on the side of a flat bed without using bedrails?: A Little Help needed moving to and from a bed to a chair (including a wheelchair)?: A Little Help needed standing up from a chair using your arms (e.g., wheelchair or bedside chair)?: A Little Help needed to walk in hospital room?: A Little Help needed climbing 3-5 steps with a railing? : A Lot 6 Click Score: 17    End of Session   Activity Tolerance: Patient tolerated treatment well;Patient limited by fatigue;Patient limited by pain Patient left: in chair;with call bell/phone within reach;with family/visitor present Nurse Communication: Mobility status PT Visit Diagnosis: Unsteadiness on feet (R26.81);Other abnormalities of gait and mobility (R26.89);Muscle weakness (generalized) (M62.81);Pain Pain - Right/Left: Right Pain - part of body: Knee    Time: 8498-8468 PT Time Calculation (min) (ACUTE ONLY): 30 min   Charges:   PT Evaluation $PT Eval Moderate Complexity: 1  Mod PT Treatments $Therapeutic Activity: 23-37 mins PT General Charges $$ ACUTE PT VISIT: 1 Visit         3:54 PM, 06/12/24 Lynwood Music, MPT Physical Therapist with [facility] 336 [phone] office [phone] mobile phone

## 2024-06-13 ENCOUNTER — Encounter (HOSPITAL_COMMUNITY): Payer: Self-pay | Admitting: Orthopedic Surgery

## 2024-06-13 LAB — BASIC METABOLIC PANEL WITH GFR
Anion gap: 12 (ref 5–15)
BUN: 13 mg/dL (ref 6–20)
CO2: 24 mmol/L (ref 22–32)
Calcium: 9.3 mg/dL (ref 8.9–10.3)
Chloride: 102 mmol/L (ref 98–111)
Creatinine, Ser: 0.78 mg/dL (ref 0.44–1.00)
GFR, Estimated: >60 mL/min (ref >60)
Glucose, Bld: 145 mg/dL — ABNORMAL HIGH (ref 70–99)
Potassium: 4.6 mmol/L (ref 3.5–5.1)
Sodium: 138 mmol/L (ref 135–145)

## 2024-06-13 LAB — CBC
HCT: 36.7 % (ref 36.0–46.0)
Hemoglobin: 11.6 g/dL — ABNORMAL LOW (ref 12.0–15.0)
MCH: 29.8 pg (ref 26.0–34.0)
MCHC: 31.6 g/dL (ref 30.0–36.0)
MCV: 94.3 fL (ref 80.0–100.0)
Platelets: 345 K/uL (ref 150–400)
RBC: 3.89 MIL/uL (ref 3.87–5.11)
RDW: 12.6 % (ref 11.5–15.5)
WBC: 12.1 K/uL — ABNORMAL HIGH (ref 4.0–10.5)
nRBC: 0 % (ref 0.0–0.2)

## 2024-06-13 MED ORDER — OXYCODONE HCL 5 MG PO TABS
10.0000 mg | ORAL_TABLET | ORAL | Status: DC
  Administered 2024-06-13 – 2024-06-14 (×5): 10 mg via ORAL
  Filled 2024-06-13 (×5): qty 2

## 2024-06-13 MED ORDER — IBUPROFEN 800 MG PO TABS
800.0000 mg | ORAL_TABLET | Freq: Four times a day (QID) | ORAL | Status: DC
  Administered 2024-06-13 (×4): 800 mg via ORAL
  Filled 2024-06-13 (×4): qty 1

## 2024-06-13 NOTE — Plan of Care (Signed)

## 2024-06-13 NOTE — Anesthesia Procedure Notes (Signed)
 Anesthesia Regional Block: Adductor canal block   Pre-Anesthetic Checklist: , timeout performed,  Correct Patient, Correct Site, Correct Laterality,  Correct Procedure, Correct Position, site marked,  Risks and benefits discussed,  Surgical consent,  Pre-op evaluation,  At surgeon's request and post-op pain management  Laterality: Right  Prep: chloraprep       Needles:   Needle Type: Stimiplex     Needle Length: 9cm  Needle Gauge: 20   Needle insertion depth: 7 cm   Additional Needles:   Procedures:,,,, ultrasound used (permanent image in chart),,    Narrative:  Start time: 06/12/2024 10:21 AM End time: 06/12/2024 10:31 AM Injection made incrementally with aspirations every 5 mL. Anesthesiologist: Kendell Yvonna PARAS, MD

## 2024-06-13 NOTE — Addendum Note (Signed)
 Addendum  created 06/13/24 0757 by Kendell Yvonna PARAS, MD   Child order released for a procedure order, Clinical Note Signed, Intraprocedure Blocks edited, SmartForm saved

## 2024-06-13 NOTE — Progress Notes (Signed)
 Physical Therapy Treatment Patient Details Name: Julie Ryan MRN: [id number] DOB: [birth year] Today's Date: 06/13/2024   RIGHT KNEE ROM: 0 - 90 degrees AMBULATION DISTANCE: 45 feet using RW with Supervision    History of Present Illness AROUSH CHASSE is a 58 y/o female, s/p Right TKA on 06/2024, with the diagnosis of osteoarthritis right knee.    PT Comments  Patient demonstrates increased endurance/distance for gait training using RW without loss of balance, fair/good return for going up/down steps using both hands on 1 side rail with understanding acknowledged and tolerated up to 0 - 90 degrees right knee flexion. Patient tolerated sitting up in chair after therapy with RLE dangling. Patient will benefit from continued skilled physical therapy in hospital and recommended venue below to increase strength, balance, endurance for safe ADLs and gait.      If plan is discharge home, recommend the following: A little help with walking and/or transfers;A little help with bathing/dressing/bathroom;Help with stairs or ramp for entrance;Assistance with cooking/housework;Assist for transportation   Can travel by private vehicle        Equipment Recommendations  None recommended by PT    Recommendations for Other Services       Precautions / Restrictions Precautions Precautions: Fall Recall of Precautions/Restrictions: Intact Restrictions Weight Bearing Restrictions Per Provider Order: Yes RLE Weight Bearing Per Provider Order: Weight bearing as tolerated     Mobility  Bed Mobility Overal bed mobility: Needs Assistance Bed Mobility: Supine to Sit     Supine to sit: Supervision     General bed mobility comments: increased time, labored movement, fair/good return for moving RLE with her hands during supine to sitting    Transfers Overall transfer level: Needs assistance Equipment used: Rolling walker (2 wheels) Transfers: Sit to/from Stand Sit to Stand: Contact guard assist,  Min assist   Step pivot transfers: Supervision       General transfer comment: has diffiuclty completing sit to stands from bedside due to increasing right knee pain    Ambulation/Gait Ambulation/Gait assistance: Supervision Gait Distance (Feet): 175 Feet Assistive device: Rolling walker (2 wheels) Gait Pattern/deviations: Decreased step length - right, Decreased step length - left, Decreased stride length, Antalgic, Decreased stance time - right Gait velocity: decreased     General Gait Details: increased endurance/distance for gait training with fair carryover for right heel to toe stepping due to right knee pain, no loss of balance   Stairs Stairs: Yes Stairs assistance: Supervision, Contact guard assist Stair Management: One rail Left, Step to pattern Number of Stairs: 3 General stair comments: demonstrates fair/good return for going up/down 3 steps using both hands on 1 siderail without  loss of balance and understanding acknowledged   Wheelchair Mobility     Tilt Bed    Modified Rankin (Stroke Patients Only)       Balance Overall balance assessment: Needs assistance Sitting-balance support: Feet supported, No upper extremity supported Sitting balance-Leahy Scale: Fair Sitting balance - Comments: fair/good seated at EOB   Standing balance support: Reliant on assistive device for balance, During functional activity, Bilateral upper extremity supported Standing balance-Leahy Scale: Fair Standing balance comment: fair/good using RW                            Communication Communication Communication: No apparent difficulties  Cognition Arousal: Alert Behavior During Therapy: WFL for tasks assessed/performed   PT - Cognitive impairments: No apparent impairments  Following commands: Intact      Cueing Cueing Techniques: Verbal cues  Exercises Total Joint Exercises Ankle Circles/Pumps: Supine, 10 reps, Right,  Strengthening, AROM Quad Sets: Supine, 10 reps, Right, Strengthening, AROM Short Arc Quad: AAROM, Strengthening, Right, 10 reps, Supine, AROM Heel Slides: AROM, AAROM, Strengthening, Right, 10 reps, Supine Goniometric ROM: 0 - 90 degrees    General Comments        Pertinent Vitals/Pain Pain Assessment Pain Score: 8  Pain Location: right knee Pain Descriptors / Indicators: Discomfort, Grimacing, Sore Pain Intervention(s): Limited activity within patient's tolerance, Monitored during session, Repositioned    Home Living                          Prior Function            PT Goals (current goals can now be found in the care plan section) Acute Rehab PT Goals Patient Stated Goal: return home with family to assist PT Goal Formulation: With patient/family Time For Goal Achievement: 06/14/24 Potential to Achieve Goals: Good Progress towards PT goals: Progressing toward goals    Frequency    BID      PT Plan      Co-evaluation              AM-PAC PT 6 Clicks Mobility   Outcome Measure  Help needed turning from your back to your side while in a flat bed without using bedrails?: None Help needed moving from lying on your back to sitting on the side of a flat bed without using bedrails?: A Little Help needed moving to and from a bed to a chair (including a wheelchair)?: A Little Help needed standing up from a chair using your arms (e.g., wheelchair or bedside chair)?: A Little Help needed to walk in hospital room?: A Little Help needed climbing 3-5 steps with a railing? : A Little 6 Click Score: 19    End of Session Equipment Utilized During Treatment: Gait belt Activity Tolerance: Patient tolerated treatment well;Patient limited by fatigue Patient left: in chair;with call bell/phone within reach Nurse Communication: Mobility status PT Visit Diagnosis: Unsteadiness on feet (R26.81);Other abnormalities of gait and mobility (R26.89);Muscle weakness  (generalized) (M62.81);Pain Pain - Right/Left: Right Pain - part of body: Knee     Time: 0830-0904 PT Time Calculation (min) (ACUTE ONLY): 34 min  Charges:    $Gait Training: 8-22 mins $Therapeutic Exercise: 8-22 mins PT General Charges $$ ACUTE PT VISIT: 1 Visit                     10:31 AM, 06/13/24 Lynwood Music, MPT Physical Therapist with [facility] [phone] office [phone] mobile phone

## 2024-06-13 NOTE — Progress Notes (Signed)
 Subjective: 1 Day Post-Op Procedure(s) (LRB): ARTHROPLASTY, KNEE, TOTAL (Right) Patient reports pain as severe.    Objective: Vital signs in last 24 hours: Temp:  [97.7 F (36.5 C)-98.2 F (36.8 C)] 98 F (36.7 C) (12/10 0005) Pulse Rate:  [64-92] 81 (12/10 0005) Resp:  [15-18] 16 (12/10 0005) BP: (112-149)/(53-81) 125/68 (12/10 0005) SpO2:  [94 %-100 %] 97 % (12/10 0729)  Intake/Output from previous day: 12/09 0701 - 12/10 0700 In: 3687.7 [I.V.:3287.7; IV Piggyback:400] Out: 4050 [Urine:3950; Blood:100] Intake/Output this shift: No intake/output data recorded.  Recent Labs    06/12/24 1031 06/13/24 0425  HGB 12.5 11.6*   Recent Labs    06/12/24 1031 06/13/24 0425  WBC 10.1 12.1*  RBC 4.13 3.89  HCT 38.7 36.7  PLT 331 345   Recent Labs    06/12/24 1031 06/13/24 0425  NA  --  138  K  --  4.6  CL  --  102  CO2  --  24  BUN  --  13  CREATININE 0.82 0.78  GLUCOSE  --  145*  CALCIUM  --  9.3   No results for input(s): LABPT, INR in the last 72 hours.  Neurologically intact Neurovascular intact Sensation intact distally Intact pulses distally Dorsiflexion/Plantar flexion intact Incision: no drainage Compartment soft   Assessment/Plan: 1 Day Post-Op Procedure(s) (LRB): ARTHROPLASTY, KNEE, TOTAL (Right) Advance diet Up with therapy D/C IV fluids      Julie Ryan 06/13/2024, 7:57 AM

## 2024-06-14 DIAGNOSIS — M1711 Unilateral primary osteoarthritis, right knee: Secondary | ICD-10-CM | POA: Diagnosis not present

## 2024-06-14 MED ORDER — BISACODYL 5 MG PO TBEC: 5.0000 mg | DELAYED_RELEASE_TABLET | Freq: Every day | ORAL | 0 refills | Status: AC | PRN

## 2024-06-14 MED ORDER — IBUPROFEN 800 MG PO TABS: 800.0000 mg | ORAL_TABLET | Freq: Three times a day (TID) | ORAL | 1 refills | Status: AC | PRN

## 2024-06-14 MED ORDER — DOCUSATE SODIUM 100 MG PO CAPS: 100.0000 mg | ORAL_CAPSULE | Freq: Two times a day (BID) | ORAL | 0 refills | Status: AC

## 2024-06-14 MED ORDER — GABAPENTIN 300 MG PO CAPS: 300.0000 mg | ORAL_CAPSULE | Freq: Three times a day (TID) | ORAL | 2 refills | Status: AC

## 2024-06-14 MED ORDER — TRAMADOL HCL 50 MG PO TABS: 50.0000 mg | ORAL_TABLET | Freq: Four times a day (QID) | ORAL | 0 refills | Status: AC

## 2024-06-14 MED ORDER — ACETAMINOPHEN 500 MG PO TABS: 500.0000 mg | ORAL_TABLET | Freq: Four times a day (QID) | ORAL | 2 refills | Status: AC | PRN

## 2024-06-14 MED ORDER — OXYCODONE HCL 10 MG PO TABS: 10.0000 mg | ORAL_TABLET | ORAL | 0 refills | Status: DC | PRN

## 2024-06-14 MED ORDER — POLYETHYLENE GLYCOL 3350 17 G PO PACK: 17.0000 g | PACK | Freq: Every day | ORAL | 0 refills | Status: AC | PRN

## 2024-06-14 MED ORDER — ASPIRIN 81 MG PO TBEC: 81.0000 mg | DELAYED_RELEASE_TABLET | Freq: Every day | ORAL | 2 refills | Status: AC

## 2024-06-14 MED ORDER — CYCLOBENZAPRINE HCL 10 MG PO TABS: 10.0000 mg | ORAL_TABLET | Freq: Three times a day (TID) | ORAL | 0 refills | Status: AC | PRN

## 2024-06-14 NOTE — Progress Notes (Signed)
 Subjective: 2 Days Post-Op Procedures (LRB): ARTHROPLASTY, KNEE, TOTAL (Right) Patient reports pain as Mrs. Lichter was kept in the hospital yesterday to try to get better pain control we increased her oxycodone  to 10 mg on a scheduled basis and added ibuprofen .  She seems to be doing better and can discharge today.    Objective: Vital signs in last 24 hours: Temp:  [98.1 F (36.7 C)-98.4 F (36.9 C)] 98.1 F (36.7 C) (12/11 0553) Pulse Rate:  [71-101] 71 (12/11 0553) Resp:  [18-20] 18 (12/11 0553) BP: (132-151)/(72-84) 143/77 (12/11 0553) SpO2:  [94 %-98 %] 98 % (12/11 0553)  Intake/Output from previous day: 12/10 0701 - 12/11 0700 In: 240 [P.O.:240] Out: -  Intake/Output this shift: No intake/output data recorded.  Recent Labs    06/12/24 1031 06/13/24 0425  HGB 12.5 11.6*   Recent Labs    06/12/24 1031 06/13/24 0425  WBC 10.1 12.1*  RBC 4.13 3.89  HCT 38.7 36.7  PLT 331 345   Recent Labs    06/12/24 1031 06/13/24 0425  NA  --  138  K  --  4.6  CL  --  102  CO2  --  24  BUN  --  13  CREATININE 0.82 0.78  GLUCOSE  --  145*  CALCIUM  --  9.3   No results for input(s): LABPT, INR in the last 72 hours.  Neurologically intact Neurovascular intact Sensation intact distally Intact pulses distally Dorsiflexion/Plantar flexion intact Incision: scant drainage No cellulitis present Compartment soft   Assessment/Plan: 2 Days Post-Op Procedures (LRB): ARTHROPLASTY, KNEE, TOTAL (Right) Discharge home with home health      [name] 06/14/2024, 8:00 AM

## 2024-06-14 NOTE — Discharge Summary (Signed)
 Physician Discharge Summary  Patient ID: Julie Ryan MRN: [id number] DOB/AGE: 60-May-1967 60 y.o.  Admit date: 06/12/2024 Discharge date: 06/14/2024  Admission Diagnoses: Right knee osteoarthritis  Discharge Diagnoses: Right knee osteoarthritis  Discharged Condition: Stable  Procedure: Right knee arthroplasty  Hospital Course:  Hospital day 1 uncomplicated total knee arthroplasty right knee -Participated in physical therapy was able to ambulate 45 FT with 0-88 range of motion  Hospital day 2 the patient continued to do well remained afebrile with stable vital signs.  However she said she did not have good pain control like she did after her first total knee.  So she was kept in the hospital to try to get the pain under control.  We made her oxycodone  every 4 hours we added 800 mg of ibuprofen  and this seemed to work better.  On hospital day 3 she was afebrile stable.  Pain was better controlled and she was allowed to be discharged  Implant Name Type Inv. Item Serial No. Manufacturer Lot No. LRB No. Used Action  CEMENT HV SMART SET - [id number] Cement CEMENT HV SMART SET  [employer] [id number] Right 2 Implanted  INSERT TIBIAL ATTUNE POST - [id number] Knees INSERT TIBIAL ATTUNE POST  [employer] [id number] Right 1 Implanted  BASEPLATE TIB CMT FB PCKT SZ4 - [id number] Stem BASEPLATE TIB CMT FB PCKT SZ4  [employer] [id number] Right 1 Implanted  PATELLA MEDIAL ATTUN KNEE - [id number] Knees PATELLA MEDIAL ATTUN KNEE  [employer] [id number] Right 1 Implanted  ATTUNE PSFEM RTSZ4 NARCEM KNEE - [id number] Femur ATTUNE PSFEM RTSZ4 NARCEM KNEE  [employer] [id number] Right 1 Implanted    Lab reports:    Latest Ref Rng & Units 06/13/2024    4:25 AM 06/12/2024   10:31 AM 06/06/2024    8:47 AM  CBC  WBC 4.0 - 10.5 K/uL 12.1  10.1  8.6   Hemoglobin 12.0 - 15.0 g/dL 88.3  87.4  86.9   Hematocrit 36.0 - 46.0 % 36.7  38.7  40.5   Platelets 150 - 400  K/uL 345  331  353        Latest Ref Rng & Units 06/13/2024    4:25 AM 06/12/2024   10:31 AM 06/06/2024    8:47 AM  BMP  Glucose 70 - 99 mg/dL 854   65   BUN 6 - 20 mg/dL 13   14   Creatinine 9.55 - 1.00 mg/dL 9.21  9.17  9.32   Sodium 135 - 145 mmol/L 138   139   Potassium 3.5 - 5.1 mmol/L 4.6   4.1   Chloride 98 - 111 mmol/L 102   102   CO2 22 - 32 mmol/L 24   25   Calcium 8.9 - 10.3 mg/dL 9.3   9.2       Discharge Exam: BP (!) 143/77 (BP Location: Right Arm)   Pulse 71   Temp 98.1 F (36.7 C) (Oral)   Resp 18   Ht 5' 6 (1.676 m)   Wt 101.7 kg   LMP 08/31/2017   SpO2 98%   BMI 36.19 kg/m  Physical Exam The patient was awake and alert.  Mentating normally.  There were no secondary signs of excessive pain on the facies.  Calf was soft and supple.  Dorsiflexion was normal no pain negative Homans' sign.  Scant drainage on the dressing.  Knee was in full extension on the bone foam.   Disposition: Discharge disposition: 01-Home or  Self Care       Discharge Instructions     Call MD / Call 911   Complete by: As directed    If you experience chest pain or shortness of breath, CALL 911 and be transported to the hospital emergency room.  If you develope a fever above 101 F, pus (white drainage) or increased drainage or redness at the wound, or calf pain, call your surgeon's office.   Constipation Prevention   Complete by: As directed    Drink plenty of fluids.  Prune juice may be helpful.  You may use a stool softener, such as Colace (over the counter) 100 mg twice a day.  Use MiraLax  (over the counter) for constipation as needed.   Diet - low sodium heart healthy   Complete by: As directed    Discharge instructions   Complete by: As directed    1 - Blue foam  30 min, 3 x a day   2 - CPM (if your insurer approved) 6 hrs per day  --may divide time in the cpm as tolertated /  --start at 0-90  and increase 10 per day.   --Use it for 2 weeks; after 2 weeks call the  number on the machine and they will pick the machine up  3- Exercises as taught while you were in the hospital   4 - No shower   5 - Dressing, It  is waterproof and does not need changing unless it soaks thru    6 - Ice. Apply the cooling device to the knee for 30 min, 6 x a day  7.  For pain control  - Tramadol  50 mg every 6 hours - Ibuprofen  800 mg every 8 hours - Tylenol  500 mg every 6 hours - Apply the ice device for 30 minutes - If you still have pain after that take the oxycodone    Increase activity slowly as tolerated   Complete by: As directed    Post-operative opioid taper instructions:   Complete by: As directed    POST-OPERATIVE OPIOID TAPER INSTRUCTIONS: It is important to wean off of your opioid medication as soon as possible. If you do not need pain medication after your surgery it is ok to stop day one. Opioids include: Codeine , Hydrocodone (Norco, Vicodin), Oxycodone (Percocet, oxycontin ) and hydromorphone  amongst others.  Long term and even short term use of opiods can cause: Increased pain response Dependence Constipation Depression Respiratory depression And more.  Withdrawal symptoms can include Flu like symptoms Nausea, vomiting And more Techniques to manage these symptoms Hydrate well Eat regular healthy meals Stay active Use relaxation techniques(deep breathing, meditating, yoga) Do Not substitute Alcohol to help with tapering If you have been on opioids for less than two weeks and do not have pain than it is ok to stop all together.  Plan to wean off of opioids This plan should start within one week post op of your joint replacement. Maintain the same interval or time between taking each dose and first decrease the dose.  Cut the total daily intake of opioids by one tablet each day Next start to increase the time between doses. The last dose that should be eliminated is the evening dose.         Allergies as of 06/14/2024       Reactions    Butrans [buprenorphine] Dermatitis   Reported 05/2024   Codeine  Nausea Only, Other (See Comments)   Dizziness    Effexor  Xr [venlafaxine  Hcl Er] Other (See  Comments)   Makes patient feel on edge    Fluoxetine  Other (See Comments)   States that med caused memory problems and crying spells    Hydrocodone  Nausea Only, Other (See Comments)   Dizziness    Methotrexate  And Trimetrexate    Caused elevation in liver function tests   Latex Itching, Rash        Medication List     STOP taking these medications    diclofenac  75 MG EC tablet Commonly known as: VOLTAREN    tirzepatide  2.5 MG/0.5ML injection vial Commonly known as: ZEPBOUND        TAKE these medications    acetaminophen  500 MG tablet Commonly known as: TYLENOL  Take 1 tablet (500 mg total) by mouth every 6 (six) hours as needed. What changed:  how much to take reasons to take this   albuterol  108 (90 Base) MCG/ACT inhaler Commonly known as: VENTOLIN  HFA Inhale 2 puffs into the lungs every 6 (six) hours as needed for wheezing or shortness of breath.   amLODipine  2.5 MG tablet Commonly known as: NORVASC  TAKE 1 TABLET(2.5 MG) BY MOUTH DAILY   amLODipine  5 MG tablet Commonly known as: NORVASC  TAKE 1 TABLET(5 MG) BY MOUTH DAILY   aspirin  EC 81 MG tablet Take 1 tablet (81 mg total) by mouth daily. Swallow whole.   BIOTIN  PO Take 1 tablet by mouth daily.   bisacodyl  5 MG EC tablet Commonly known as: DULCOLAX Take 1 tablet (5 mg total) by mouth daily as needed for moderate constipation.   budesonide-formoterol 80-4.5 MCG/ACT inhaler Commonly known as: SYMBICORT Inhale 2 puffs into the lungs 2 (two) times daily.   cyclobenzaprine  10 MG tablet Commonly known as: FLEXERIL  Take 1 tablet (10 mg total) by mouth 3 (three) times daily as needed for muscle spasms. What changed:  when to take this reasons to take this   docusate sodium  100 MG capsule Commonly known as: COLACE Take 1 capsule (100 mg total)  by mouth 2 (two) times daily.   escitalopram  10 MG tablet Commonly known as: Lexapro  Take 1 tablet (10 mg total) by mouth daily.   gabapentin  300 MG capsule Commonly known as: NEURONTIN  Take 1 capsule (300 mg total) by mouth 3 (three) times daily. What changed:  how much to take how to take this when to take this additional instructions   ibuprofen  800 MG tablet Commonly known as: ADVIL  Take 1 tablet (800 mg total) by mouth every 8 (eight) hours as needed.   IRON PO Take 1 tablet by mouth daily.   metoprolol  succinate 25 MG 24 hr tablet Commonly known as: TOPROL -XL Take 1 tablet (25 mg total) by mouth daily.   Oxycodone  HCl 10 MG Tabs Take 1 tablet (10 mg total) by mouth every 4 (four) hours as needed for severe pain (pain score 7-10).   polyethylene glycol 17 g packet Commonly known as: MIRALAX  / GLYCOLAX  Take 17 g by mouth daily as needed for mild constipation.   PROBIOTIC PO Take by mouth daily.   traMADol  50 MG tablet Commonly known as: ULTRAM  Take 1 tablet (50 mg total) by mouth 4 (four) times daily. What changed:  when to take this reasons to take this   Vitamin D  (Ergocalciferol ) 1.25 MG (50000 UNIT) Caps capsule Commonly known as: DRISDOL  TAKE 1 CAPSULE BY MOUTH EVERY 7 DAYS        Contact information for after-discharge care     Home Medical Care     [facility] [place] (  HH) .   Service: Home Health Services Contact information: [address] [place]  [address] [phone]                     Signed: [name] 06/14/2024, 9:02 AM

## 2024-06-14 NOTE — TOC Transition Note (Signed)
 Transition of Care [facility]) - Discharge Note   Patient Details  Name: Julie Ryan MRN: [id number] Date of Birth: January 24, 1966  Transition of Care [facility] CM/SW Contact:  Noreen KATHEE [name] Phone Number: 06/14/2024, 9:04 AM   Clinical Narrative:     Patient is DC today back home . Delon with [facility] was made aware. CSW signing off.   Final next level of care: Home w Home Health Services Barriers to Discharge: Barriers resolved    Patient Goals and CMS Choice Patient states their goals for this hospitalization and ongoing recovery are:: return home CMS Medicare.gov Compare Post Acute Care list provided to:: Patient Choice offered to / list presented to : Patient      Discharge Placement                  Name of family member notified: Rock Patient and family notified of of transfer: 06/14/24  Discharge Plan and Services Additional resources added to the After Visit Summary for   In-house Referral: Clinical Social Work                        HH Arranged: PT HH Agency: [facility] Date [facility] Agency Contacted: 06/14/24 Time HH Agency Contacted: [id number] Representative spoke with at [facility] Agency: Delon  Social Drivers of Health (SDOH) Interventions SDOH Screenings   Food Insecurity: Food Insecurity Present (06/12/2024)  Housing: High Risk (06/12/2024)  Transportation Needs: No Transportation Needs (06/12/2024)  Utilities: Not At Risk (06/12/2024)  Alcohol Screen: Low Risk (09/21/2023)  Depression (PHQ2-9): Medium Risk (02/17/2024)  Financial Resource Strain: Medium Risk (05/11/2024)  Physical Activity: Inactive (05/11/2024)  Social Connections: Socially Isolated (06/12/2024)  Stress: No Stress Concern Present (05/11/2024)  Tobacco Use: Medium Risk (06/12/2024)     Readmission Risk Interventions     No data to display

## 2024-06-17 ENCOUNTER — Telehealth: Payer: Self-pay | Admitting: Orthopedic Surgery

## 2024-06-17 NOTE — Telephone Encounter (Signed)
 Dr. Areatha pt GLENWOOD Ip PT w/[facility] [phone] lvm 06/15/24 at 2pm requesting verbal orders for [facility] PT 1w1, 4x1 and 1x1.

## 2024-06-18 ENCOUNTER — Telehealth: Payer: Self-pay | Admitting: Orthopedic Surgery

## 2024-06-18 LAB — TYPE AND SCREEN
ABO/RH(D): O POS
Antibody Screen: NEGATIVE
Unit division: 0
Unit division: 0

## 2024-06-18 LAB — BPAM RBC
Blood Product Expiration Date: 202512312359
Blood Product Expiration Date: 202512312359
Unit Type and Rh: 5100
Unit Type and Rh: 5100

## 2024-06-18 NOTE — Telephone Encounter (Signed)
 I called to give verbal orders

## 2024-06-18 NOTE — Telephone Encounter (Signed)
 closed

## 2024-06-19 NOTE — Telephone Encounter (Signed)
 Completed auth on cover my meds for the tramadol 

## 2024-06-26 ENCOUNTER — Ambulatory Visit (INDEPENDENT_AMBULATORY_CARE_PROVIDER_SITE_OTHER): Admitting: Orthopedic Surgery

## 2024-06-26 ENCOUNTER — Encounter: Payer: Self-pay | Admitting: Orthopedic Surgery

## 2024-06-26 ENCOUNTER — Encounter (HOSPITAL_COMMUNITY): Payer: Self-pay

## 2024-06-26 ENCOUNTER — Ambulatory Visit (HOSPITAL_COMMUNITY)

## 2024-06-26 DIAGNOSIS — M1711 Unilateral primary osteoarthritis, right knee: Secondary | ICD-10-CM | POA: Insufficient documentation

## 2024-06-26 DIAGNOSIS — M25661 Stiffness of right knee, not elsewhere classified: Secondary | ICD-10-CM | POA: Insufficient documentation

## 2024-06-26 DIAGNOSIS — Z96651 Presence of right artificial knee joint: Secondary | ICD-10-CM | POA: Insufficient documentation

## 2024-06-26 DIAGNOSIS — R600 Localized edema: Secondary | ICD-10-CM | POA: Insufficient documentation

## 2024-06-26 DIAGNOSIS — M25561 Pain in right knee: Secondary | ICD-10-CM | POA: Insufficient documentation

## 2024-06-26 MED ORDER — OXYCODONE HCL 10 MG PO TABS: 10.0000 mg | ORAL_TABLET | ORAL | 0 refills | Status: AC | PRN

## 2024-06-26 NOTE — Therapy (Signed)
 " OUTPATIENT PHYSICAL THERAPY LOWER EXTREMITY EVALUATION   Patient Name: Julie Ryan MRN: [id number] DOB:02-24-1966, 58 y.o., female Today's Date: 06/26/2024  END OF SESSION:  PT End of Session - 06/26/24 1334     Visit Number 1    Number of Visits 12    Date for Recertification  08/31/24    Authorization Type [employer]    Authorization Time Period no authorization required    PT Start Time 1335    PT Stop Time 1413    PT Time Calculation (min) 38 min    Activity Tolerance Patient tolerated treatment well    Behavior During Therapy [facility] for tasks assessed/performed          Past Medical History:  Diagnosis Date   Angio-edema    Anxiety    Arthritis    [employer]reesia 08/19/2020   Carpal tunnel syndrome of right wrist    Chronic knee pain    Depression    Diabetes mellitus without complication (HCC)    Endometrial polyp 08/09/2017   will get appt with Dr Jayne   Fibroids 08/09/2017   Has multiple small fibroids    GERD (gastroesophageal reflux disease)    Hypertension    Neuropathy    Obesity    Prediabetes    Seizures (HCC)    1 seizure as a chold; unknown etiology and has never been on meds   Sleep apnea    Past Surgical History:  Procedure Laterality Date   APPENDECTOMY     CARPAL TUNNEL RELEASE Right 08/01/2015   Procedure: RIGHT CARPAL TUNNEL RELEASE;  Surgeon: Taft FORBES Minerva, MD;  Location: [facility];  Service: Orthopedics;  Laterality: Right;   CERVICAL POLYPECTOMY  09/14/2017   Procedure: ENDOMETRIAL POLYPECTOMY;  Surgeon: Jayne Vonn DEL, MD;  Location: [facility];  Service: Gynecology;;   COLONOSCOPY N/A 06/15/2017   Procedure: COLONOSCOPY;  Surgeon: Golda Claudis PENNER, MD;  Location: [facility];  Service: Endoscopy;  Laterality: N/A;  830   ENDOMETRIAL ABLATION N/A 09/14/2017   Procedure: ENDOMETRIAL ABLATION( Minerva);  Surgeon: Jayne Vonn DEL, MD;  Location: [facility];  Service: Gynecology;  Laterality: N/A;   ESOPHAGOGASTRODUODENOSCOPY N/A 05/25/2013   Procedure:  ESOPHAGOGASTRODUODENOSCOPY (EGD);  Surgeon: Claudis PENNER Golda, MD;  Location: [facility];  Service: Endoscopy;  Laterality: N/A;  220   GASTRIC BYPASS     HYSTEROSCOPY WITH D & C N/A 09/14/2017   Procedure: DILATATION AND CURETTAGE /HYSTEROSCOPY;  Surgeon: Jayne Vonn DEL, MD;  Location: [facility];  Service: Gynecology;  Laterality: N/A;   TOTAL KNEE ARTHROPLASTY Left 05/11/2023   Procedure: TOTAL KNEE ARTHROPLASTY;  Surgeon: Minerva Taft FORBES, MD;  Location: [facility];  Service: Orthopedics;  Laterality: Left;   TOTAL KNEE ARTHROPLASTY Right 06/12/2024   Procedure: ARTHROPLASTY, KNEE, TOTAL;  Surgeon: Minerva Taft FORBES, MD;  Location: [facility];  Service: Orthopedics;  Laterality: Right;   TUBAL LIGATION     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 factor (HCC) 08/28/2020   High risk medication use 08/28/2020  Neck pain 08/19/2020   Dermatomycosis 07/01/2020   Hiatal hernia 07/17/2019   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   Endometrial polyp 08/09/2017   Fibroids 08/09/2017   Dyslipidemia 06/27/2017   Leg edema 03/08/2017   GAD (generalized anxiety disorder) 06/01/2016   Metabolic syndrome X 07/08/2013   Erosive esophagitis 06/25/2013   GERD (gastroesophageal reflux disease) 03/22/2013   Left knee pain 03/22/2013   Snoring 08/05/2011   Vitamin D  deficiency 03/21/2011   Allergic rhinitis 09/20/2010   MORBID OBESITY 02/11/2010   Essential hypertension 02/11/2010    PCP: Antonetta Rollene BRAVO, MD    REFERRING PROVIDER: Margrette Taft BRAVO, MD   REFERRING DIAG: Primary osteoarthritis of right knee   THERAPY DIAG:  Status post right knee replacement  Acute pain of right knee  Stiffness of right knee, not elsewhere classified  Localized edema  Rationale for Evaluation and Treatment: Rehabilitation  ONSET DATE: 06/12/24  SUBJECTIVE:   SUBJECTIVE STATEMENT: Patient reports that she is feeling pretty good today. She had a right total knee replacement on 06/12/24. She had home health PT about 4 times since surgery with their last visit being yesterday. She was given a HEP which she was able to recall and demonstrate. She goes to see her surgeon later today. She has been trying to slowly progress from her walker to the cane.   PERTINENT HISTORY: Hypertension, RA, OA, diabetes, neuropathy, and history of seizures PAIN:  Are you having pain? Yes: NPRS scale: Current: 5/10 Best: 5/10 Worst: 10/10 Pain location: right knee Pain description: sharp  Aggravating factors: transfers and stairs  Relieving factors: ice  PRECAUTIONS: None  RED FLAGS: None   WEIGHT BEARING RESTRICTIONS: No  FALLS:  Has patient fallen in last 6 months? No  LIVING ENVIRONMENT: Lives with: lives with their family Lives in: House/apartment Stairs: Yes: External: 2 steps; on right going up; step to pattern Has following equipment at home: Quad cane small base and Walker - 2 wheeled  OCCUPATION: not working currently; scheduled to return 09/05/24  PLOF: Independent  PATIENT GOALS: be able to walk normal   NEXT MD VISIT: 06/26/24  OBJECTIVE:  Note: Objective measures were completed at Evaluation unless otherwise noted.  DIAGNOSTIC FINDINGS: 06/12/24 right knee x-ray  IMPRESSION: Status post tricompartmental knee replacement without evidence for immediate hardware complication. PATIENT SURVEYS:  LEFS  Extreme difficulty/unable (0), Quite a bit of difficulty (1), Moderate difficulty (2), Little  difficulty (3), No difficulty (4) Survey date:  06/26/24  Any of your usual work, housework or school activities 1  2. Usual hobbies, recreational or sporting activities 4  3. Getting into/out of the bath 0  4. Walking between rooms 4  5. Putting on socks/shoes 3  6. Squatting  0  7. Lifting an object, like a bag of groceries from the floor 4  8. Performing light activities around your home 3  9. Performing heavy activities around your home 0  10. Getting into/out of a car 2  11. Walking 2 blocks 0  12. Walking 1 mile 0  13. Going up/down 10 stairs (1 flight) 0  14. Standing for 1 hour 2  15.  sitting for 1 hour 4  16. Running on even ground 0  17. Running on uneven ground 0  18. Making sharp turns while running fast 0  19. Hopping  0  20. Rolling over in bed 0  Score total:  27/80     COGNITION: Overall cognitive status: Within functional limits for tasks  assessed     SENSATION: Patient reports numbness along her right lateral tibiofemoral joint line  EDEMA:  Circumferential: Right tibiofemoral joint line: 57.2 cm  Left tibiofemoral joint line: 48.2 cm   PALPATION: TTP: right hip adductors  LOWER EXTREMITY ROM:  Active ROM Right eval Left eval  Hip flexion    Hip extension    Hip abduction    Hip adduction    Hip internal rotation    Hip external rotation    Knee flexion 62 PROM: 68  107  Knee extension 3 0  Ankle dorsiflexion    Ankle plantarflexion    Ankle inversion    Ankle eversion     (Blank rows = not tested)  LOWER EXTREMITY MMT: not tested due to surgical condition  FUNCTIONAL TESTS:  2 minute walk test: 268 feet with RW   GAIT: Distance walked: 268 Assistive device utilized: Environmental Consultant - 2 wheeled Level of assistance: Modified independence Comments: decreased gait speed and stride length                                                                                                                                TREATMENT DATE:   06/26/24:  PT evaluation, patient education, and reviewed HEP   PATIENT EDUCATION:  Education details: POC, healing, objective findings, anatomy, reviewed HEP, and goals for physical therapy Person educated: Patient Education method: Explanation Education comprehension: verbalized understanding  HOME EXERCISE PROGRAM: Reviewed HH PT HEP  ASSESSMENT:  CLINICAL IMPRESSION: Patient is a 58 y.o. female who was seen today for physical therapy evaluation and treatment following a right total knee arthroplasty on 06/12/24.  She presented with moderate pain severity and irritability with right knee flexion being the most aggravating to her familiar symptoms.  She exhibited increased right knee edema.  However, she exhibited no other signs or symptoms of a postoperative complication. Her HEP was reviewed and she was encouraged to continue with these interventions. Recommend that she continue with skilled physical therapy to address her impairments to return to her prior level of function.   OBJECTIVE IMPAIRMENTS: Abnormal gait, decreased activity tolerance, decreased mobility, difficulty walking, decreased ROM, decreased strength, hypomobility, increased edema, impaired sensation, impaired tone, and pain.   ACTIVITY LIMITATIONS: lifting, standing, squatting, stairs, transfers, bathing, dressing, and locomotion level  PARTICIPATION LIMITATIONS: cleaning, laundry, driving, shopping, community activity, and occupation  PERSONAL FACTORS: Past/current experiences, Transportation, and 3+ comorbidities: Hypertension, RA, OA, diabetes, neuropathy, and history of seizures are also affecting patient's functional outcome.   REHAB POTENTIAL: Good  CLINICAL DECISION MAKING: Evolving/moderate complexity  EVALUATION COMPLEXITY: Moderate   GOALS: Goals reviewed with patient? Yes  SHORT TERM GOALS: Target date: 07/17/24 Patient will be independent with her initial HEP.  Baseline: Goal status: INITIAL  2.  Patient  will improve her active right knee flexion to at least 90 degrees for improved right knee mobility.  Baseline:  Goal status: INITIAL  3.  Patient  will be able to ambulate at least 80 feet without an assistive device for improved household mobility. Baseline:  Goal status: INITIAL  LONG TERM GOALS: Target date: 08/07/24  Patient will be independent with her advanced HEP.  Baseline:  Goal status: INITIAL  2.  Patient will improve her active right knee flexion to at least 115 degrees for improved function navigating stairs.  Baseline:  Goal status: INITIAL  3.  Patient will improve her LEFS score to at least 50/80 for improved perceived function with her daily activities. Baseline:  Goal status: INITIAL  4.  Patient will be able to ambulate at least 200 feet with no significant gait deviations. Baseline:  Goal status: INITIAL  5.  Patient will improve her 2-minute walk test distance by at least 100 feet for improved community mobility. Baseline:  Goal status: INITIAL  PLAN:  PT FREQUENCY: 2x/week  PT DURATION: 6 weeks  PLANNED INTERVENTIONS: 02835- PT Re-evaluation, 97750- Physical Performance Testing, 97110-Therapeutic exercises, 97530- Therapeutic activity, 97112- Neuromuscular re-education, 97535- Self Care, 02859- Manual therapy, [phone]- Gait training, Patient/Family education, Balance training, Stair training, Joint mobilization, Cryotherapy, and Moist heat  PLAN FOR NEXT SESSION: review and update HEP, manual therapy, lower extremity strengthening, balance interventions, and gait training   Lacinda JAYSON Fass, PT 06/26/2024, 3:57 PM  "

## 2024-06-26 NOTE — Progress Notes (Signed)
 Orthopaedic Postop Note  Assessment: Julie Ryan is a 58 y.o. female s/p right total knee arthroplasty (Dr. Margrette)  DOS: 06/12/24  Plan: Kurtis were removed.  New dressing was placed Refill for pain medications. Continue DVT prophylaxis Continue to work with physical therapy Daily ambulation Follow-up with Dr. Margrette in approximately 1 month.  Meds ordered this encounter  Medications   Oxycodone  HCl 10 MG TABS    Sig: Take 1 tablet (10 mg total) by mouth every 4 (four) hours as needed.    Dispense:  30 tablet    Refill:  0     Follow-up: Return in about 4 weeks (around 07/24/2024). XR at next visit: Left knee x-rays  Subjective:  Chief Complaint  Patient presents with   Post-op Follow-up    Right total knee replacement 06/12/24 staples removed patient tolerated well is using CPM and going for OP PT needs Oxycodone  pharmacy would only let her have the Tramadol      History of Present Illness: Julie Ryan is a 58 y.o. female who presents following the above stated procedure.  Surgery was approximate 2 weeks ago.  She is doing well.  She is at home, working with therapy.  She previously had her left knee done, and is aware of what to expect.  She continues to take pain medicines.  She is taking aspirin  appropriately.  She has had no issues since surgery.  Review of Systems: No fevers or chills No numbness or tingling No Chest Pain No shortness of breath   Objective: LMP 08/31/2017   Physical Exam:  Alert and oriented.  No acute distress.  Use a walker to assist with ambulation.  Knee incision is healing.  No surrounding erythema or drainage.  Mild diffuse swelling.  No redness in the calf.  No cords are appreciated.  Range of motion is approximately 5-80 degrees.  IMAGING: I personally ordered and reviewed the following images:  No new imaging obtained today.   Oneil DELENA Horde, MD 06/27/2024 8:28 AM

## 2024-07-02 ENCOUNTER — Ambulatory Visit (HOSPITAL_COMMUNITY)

## 2024-07-02 ENCOUNTER — Encounter (HOSPITAL_COMMUNITY): Payer: Self-pay

## 2024-07-02 DIAGNOSIS — M25561 Pain in right knee: Secondary | ICD-10-CM

## 2024-07-02 DIAGNOSIS — Z96651 Presence of right artificial knee joint: Secondary | ICD-10-CM

## 2024-07-02 DIAGNOSIS — M25661 Stiffness of right knee, not elsewhere classified: Secondary | ICD-10-CM

## 2024-07-02 NOTE — Therapy (Signed)
 " OUTPATIENT PHYSICAL THERAPY LOWER EXTREMITY TREATMENT   Patient Name: Julie Ryan MRN: [id number] DOB:06-20-1966, 58 y.o., female Today's Date: 07/02/2024  END OF SESSION:  PT End of Session - 07/02/24 1340     Visit Number 2    Number of Visits 12    Date for Recertification  08/31/24    Authorization Type [employer]    Authorization Time Period no authorization required    Progress Note Due on Visit 10    PT Start Time 1342   late arrival   PT Stop Time 1415    PT Time Calculation (min) 33 min    Activity Tolerance Patient tolerated treatment well    Behavior During Therapy [facility] for tasks assessed/performed          Past Medical History:  Diagnosis Date   Angio-edema    Anxiety    Arthritis    Phreesia 08/19/2020   Carpal tunnel syndrome of right wrist    Chronic knee pain    Depression    Diabetes mellitus without complication (HCC)    Endometrial polyp 08/09/2017   will get appt with Dr Julie   Fibroids 08/09/2017   Has multiple small fibroids    GERD (gastroesophageal reflux disease)    Hypertension    Neuropathy    Obesity    Prediabetes    Seizures (HCC)    1 seizure as a chold; unknown etiology and has never been on meds   Sleep apnea    Past Surgical History:  Procedure Laterality Date   APPENDECTOMY     CARPAL TUNNEL RELEASE Right 08/01/2015   Procedure: RIGHT CARPAL TUNNEL RELEASE;  Surgeon: Julie Ryan Minerva, Julie Ryan;  Location: [facility];  Service: Orthopedics;  Laterality: Right;   CERVICAL POLYPECTOMY  09/14/2017   Procedure: ENDOMETRIAL POLYPECTOMY;  Surgeon: Julie Vonn DEL, Julie Ryan;  Location: [facility];  Service: Gynecology;;   COLONOSCOPY N/A 06/15/2017   Procedure: COLONOSCOPY;  Surgeon: Julie Julie PENNER, Julie Ryan;  Location: [facility];  Service: Endoscopy;  Laterality: N/A;  830   ENDOMETRIAL ABLATION N/A 09/14/2017   Procedure: ENDOMETRIAL ABLATION( Julie);  Surgeon: Julie Vonn DEL, Julie Ryan;  Location: [facility];  Service: Gynecology;  Laterality: N/A;    ESOPHAGOGASTRODUODENOSCOPY N/A 05/25/2013   Procedure: ESOPHAGOGASTRODUODENOSCOPY (EGD);  Surgeon: Julie Ryan Golda, Julie Ryan;  [facility]: [facility];  Service: Endoscopy;  Laterality: N/A;  220   GASTRIC BYPASS     HYSTEROSCOPY WITH D & C N/A 09/14/2017   Procedure: DILATATION AND CURETTAGE /HYSTEROSCOPY;  Surgeon: Julie Vonn DEL, Julie Ryan;  Location: [facility];  Service: Gynecology;  Laterality: N/A;   TOTAL KNEE ARTHROPLASTY Left 05/11/2023   Procedure: TOTAL KNEE ARTHROPLASTY;  Surgeon: Julie Julie FORBES, Julie Ryan;  Location: [facility];  Service: Orthopedics;  Laterality: Left;   TOTAL KNEE ARTHROPLASTY Right 06/12/2024   Procedure: ARTHROPLASTY, KNEE, TOTAL;  Surgeon: Julie Julie FORBES, Julie Ryan;  Location: [facility];  Service: Orthopedics;  Laterality: Right;   TUBAL LIGATION     Patient Active Problem List   Diagnosis Date Noted   Need for vaccination 05/14/2024   Primary osteoarthritis of right knee 05/04/2024   LLQ pain 10/06/2023   Fatigue 10/06/2023   s/p left knee replacement 05/11/23 05/27/2023   Primary osteoarthritis of left knee 05/11/2023   Osteoarthritis of left knee 05/11/2023   COVID-19 04/04/2023   Low serum vitamin B12 12/22/2022   Bilateral primary osteoarthritis of knee 06/02/2021   Angioedema 01/01/2021   Rheumatoid arthritis involving both hands with  positive rheumatoid factor (HCC) 08/28/2020   High risk medication use 08/28/2020   Neck pain 08/19/2020   Dermatomycosis 07/01/2020   Hiatal hernia 07/17/2019   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   Endometrial polyp 08/09/2017   Fibroids 08/09/2017   Dyslipidemia 06/27/2017   Leg edema 03/08/2017   GAD (generalized anxiety disorder) 06/01/2016   Metabolic syndrome X 07/08/2013   Erosive esophagitis 06/25/2013   GERD (gastroesophageal reflux disease) 03/22/2013   Left knee pain 03/22/2013   Snoring 08/05/2011   Vitamin D  deficiency 03/21/2011   Allergic rhinitis 09/20/2010   MORBID OBESITY 02/11/2010   Essential  hypertension 02/11/2010    PCP: Julie Rollene BRAVO, Julie Ryan   REFERRING PROVIDER: Margrette Julie BRAVO, Julie Ryan   REFERRING DIAG: Primary osteoarthritis of right knee   THERAPY DIAG:  Status post right knee replacement  Acute pain of right knee  Stiffness of right knee, not elsewhere classified  Rationale for Evaluation and Treatment: Rehabilitation  ONSET DATE: 06/12/24  SUBJECTIVE:   SUBJECTIVE STATEMENT: Pt reports pulling sensation in knee, around 7/10. Reports over holidays she hasn't been doing HEP as well. Reached 100 degrees on CPM. Reports her stitches are out. Got good report from Julie Ryan at follow up visit.    EVAL: Patient reports that she is feeling pretty good today. She had a right total knee replacement on 06/12/24. She had home health PT about 4 times since surgery with their last visit being yesterday. She was given a HEP which she was able to recall and demonstrate. She goes to see her surgeon later today. She has been trying to slowly progress from her walker to the cane.   PERTINENT HISTORY: Hypertension, RA, OA, diabetes, neuropathy, and history of seizures PAIN:  Are you having pain? Yes: NPRS scale: Current: 5/10 Best: 5/10 Worst: 10/10 Pain location: right knee Pain description: sharp  Aggravating factors: transfers and stairs  Relieving factors: ice  PRECAUTIONS: None  RED FLAGS: None   WEIGHT BEARING RESTRICTIONS: No  FALLS:  Has patient fallen in last 6 months? No  LIVING ENVIRONMENT: Lives with: lives with their family Lives in: House/apartment Stairs: Yes: External: 2 steps; on right going up; step to pattern Has following equipment at home: Quad cane small base and Walker - 2 wheeled  OCCUPATION: not working currently; scheduled to return 09/05/24  PLOF: Independent  PATIENT GOALS: be able to walk normal   NEXT Julie Ryan VISIT: 06/26/24  OBJECTIVE:  Note: Objective measures were completed at Evaluation unless otherwise noted.  DIAGNOSTIC FINDINGS:  06/12/24 right knee x-ray  IMPRESSION: Status post tricompartmental knee replacement without evidence for immediate hardware complication. PATIENT SURVEYS:  LEFS  Extreme difficulty/unable (0), Quite a bit of difficulty (1), Moderate difficulty (2), Little difficulty (3), No difficulty (4) Survey date:  06/26/24  Any of your usual work, housework or school activities 1  2. Usual hobbies, recreational or sporting activities 4  3. Getting into/out of the bath 0  4. Walking between rooms 4  5. Putting on socks/shoes 3  6. Squatting  0  7. Lifting an object, like a bag of groceries from the floor 4  8. Performing light activities around your home 3  9. Performing heavy activities around your home 0  10. Getting into/out of a car 2  11. Walking 2 blocks 0  12. Walking 1 mile 0  13. Going up/down 10 stairs (1 flight) 0  14. Standing for 1 hour 2  15.  sitting for 1 hour 4  16. Running on even ground 0  17. Running on uneven ground 0  18. Making sharp turns while running fast 0  19. Hopping  0  20. Rolling over in bed 0  Score total:  27/80     COGNITION: Overall cognitive status: Within functional limits for tasks assessed     SENSATION: Patient reports numbness along her right lateral tibiofemoral joint line  EDEMA:  Circumferential: Right tibiofemoral joint line: 57.2 cm  Left tibiofemoral joint line: 48.2 cm   PALPATION: TTP: right hip adductors  LOWER EXTREMITY ROM:  Active ROM Right eval Left eval  Hip flexion    Hip extension    Hip abduction    Hip adduction    Hip internal rotation    Hip external rotation    Knee flexion 62 PROM: 68  107  Knee extension 3 0  Ankle dorsiflexion    Ankle plantarflexion    Ankle inversion    Ankle eversion     (Blank rows = not tested)  LOWER EXTREMITY MMT: not tested due to surgical condition  FUNCTIONAL TESTS:  2 minute walk test: 268 feet with RW   GAIT: Distance walked: 268 Assistive device utilized: Environmental [employer]ant - 2  wheeled Level of assistance: Modified independence Comments: decreased gait speed and stride length                                                                                                                                TREATMENT DATE:   07/02/24:  Review of goals AAROM Heel slides with straps, 10 holds, x10 Quad set, heel on half foam roll, 10 holds, 10x Supine SAQ 2x10 Supine hamstring curls. Feet on physio ball, 2x10 Seated LAQ, 15x STS, 10x, encouraged no UE use but pt fearful, completes only 1 w/o UE     06/26/24: PT evaluation, patient education, and reviewed HEP   PATIENT EDUCATION:  Education details: POC, healing, objective findings, anatomy, reviewed HEP, and goals for physical therapy Person educated: Patient Education method: Explanation Education comprehension: verbalized understanding  HOME EXERCISE PROGRAM: Reviewed HH PT HEP  ASSESSMENT:  CLINICAL IMPRESSION: Began session with review of goals. PT reports understanding and agreeable. Followed with mobility and strengthening exercises for R knee in supine position. PT does not tolerate lying completely flat well. Elevated pt with wedge with pt tolerating remainder of supine exercises well. Added in seated and standing exercises towards end of session. PT demo good quad strength during LAQ. Discussion about compression, elevation and LE mobility for managing swelling. Ended with STS, pt demo some fear when standing without UE support. Completes only 1 in session. Reports Improvements in pain at end of session with 6/10 rating. Patient will benefit from continued skilled physical therapy in order to address ROM, strength, and balance deficits to improve overall function.     EVAL: Patient is a 58 y.o. female who was seen today for physical therapy evaluation and treatment following  a right total knee arthroplasty on 06/12/24.  She presented with moderate pain severity and irritability with right knee flexion  being the most aggravating to her familiar symptoms.  She exhibited increased right knee edema.  However, she exhibited no other signs or symptoms of a postoperative complication. Her HEP was reviewed and she was encouraged to continue with these interventions. Recommend that she continue with skilled physical therapy to address her impairments to return to her prior level of function.   OBJECTIVE IMPAIRMENTS: Abnormal gait, decreased activity tolerance, decreased mobility, difficulty walking, decreased ROM, decreased strength, hypomobility, increased edema, impaired sensation, impaired tone, and pain.   ACTIVITY LIMITATIONS: lifting, standing, squatting, stairs, transfers, bathing, dressing, and locomotion level  PARTICIPATION LIMITATIONS: cleaning, laundry, driving, shopping, community activity, and occupation  PERSONAL FACTORS: Past/current experiences, Transportation, and 3+ comorbidities: Hypertension, RA, OA, diabetes, neuropathy, and history of seizures are also affecting patient's functional outcome.   REHAB POTENTIAL: Good  CLINICAL DECISION MAKING: Evolving/moderate complexity  EVALUATION COMPLEXITY: Moderate   GOALS: Goals reviewed with patient? Yes  SHORT TERM GOALS: Target date: 07/17/24 Patient will be independent with her initial HEP.  Baseline:  Goal status: INITIAL  2.  Patient will improve her active right knee flexion to at least 90 degrees for improved right knee mobility.  Baseline:  Goal status: INITIAL  3.  Patient will be able to ambulate at least 80 feet without an assistive device for improved household mobility. Baseline:  Goal status: INITIAL  LONG TERM GOALS: Target date: 08/07/24  Patient will be independent with her advanced HEP.  Baseline:  Goal status: INITIAL  2.  Patient will improve her active right knee flexion to at least 115 degrees for improved function navigating stairs.  Baseline:  Goal status: INITIAL  3.  Patient will improve her  LEFS score to at least 50/80 for improved perceived function with her daily activities. Baseline:  Goal status: INITIAL  4.  Patient will be able to ambulate at least 200 feet with no significant gait deviations. Baseline:  Goal status: INITIAL  5.  Patient will improve her 2-minute walk test distance by at least 100 feet for improved community mobility. Baseline:  Goal status: INITIAL  PLAN:  PT FREQUENCY: 2x/week  PT DURATION: 6 weeks  PLANNED INTERVENTIONS: 97164- PT Re-evaluation, 97750- Physical Performance Testing, 97110-Therapeutic exercises, 97530- Therapeutic activity, [id number]- Neuromuscular re-education, 97535- Self Care, [id number]- Manual therapy, [phone]- Gait training, Patient/Family education, Balance training, Stair training, Joint mobilization, Cryotherapy, and Moist heat  PLAN FOR NEXT SESSION: manual therapy, lower extremity strengthening, balance interventions, and gait training   2:20 PM, 07/02/2024 Platon Arocho Powell-Butler, PT, DPT [facility] [place]  "

## 2024-07-06 ENCOUNTER — Telehealth: Payer: Self-pay | Admitting: Orthopedic Surgery

## 2024-07-06 ENCOUNTER — Ambulatory Visit (HOSPITAL_COMMUNITY): Attending: Orthopedic Surgery

## 2024-07-06 ENCOUNTER — Encounter (HOSPITAL_COMMUNITY): Payer: Self-pay

## 2024-07-06 DIAGNOSIS — R600 Localized edema: Secondary | ICD-10-CM | POA: Insufficient documentation

## 2024-07-06 DIAGNOSIS — M25661 Stiffness of right knee, not elsewhere classified: Secondary | ICD-10-CM | POA: Diagnosis present

## 2024-07-06 DIAGNOSIS — Z96651 Presence of right artificial knee joint: Secondary | ICD-10-CM | POA: Insufficient documentation

## 2024-07-06 DIAGNOSIS — M25561 Pain in right knee: Secondary | ICD-10-CM | POA: Insufficient documentation

## 2024-07-06 NOTE — Telephone Encounter (Signed)
 Received vague vm from patient regarding STD and records. IC,lmvm advised that we have not received anything from anyone requesting medical records. The last we did was 7A, 703 form and FMLA back on 05/25/24. If she needs medical records to be faxed she will need to sign an authorization to release medical records. The auth on file is blank with only signature. We will need a complete auth that indicates who patient authorizes we are releasing to and provide a fax number and the date range on records that need to be sent.

## 2024-07-06 NOTE — Therapy (Signed)
 " OUTPATIENT PHYSICAL THERAPY LOWER EXTREMITY TREATMENT   Patient Name: Julie Ryan MRN: [id number] DOB:August 08, 1965, 59 y.o., female Today's Date: 07/06/2024  END OF SESSION:  PT End of Session - 07/06/24 1328     Visit Number 3    Number of Visits 12    Date for Recertification  08/31/24    Authorization Type [employer]    Authorization Time Period no authorization required    Progress Note Due on Visit 10    PT Start Time 1328    PT Stop Time 1414    PT Time Calculation (min) 46 min    Activity Tolerance Patient tolerated treatment well    Behavior During Therapy [facility] for tasks assessed/performed           Past Medical History:  Diagnosis Date   Angio-edema    Anxiety    Arthritis    Phreesia 08/19/2020   Carpal tunnel syndrome of right wrist    Chronic knee pain    Depression    Diabetes mellitus without complication (HCC)    Endometrial polyp 08/09/2017   will get appt with Dr Jayne   Fibroids 08/09/2017   Has multiple small fibroids    GERD (gastroesophageal reflux disease)    Hypertension    Neuropathy    Obesity    Prediabetes    Seizures (HCC)    1 seizure as a chold; unknown etiology and has never been on meds   Sleep apnea    Past Surgical History:  Procedure Laterality Date   APPENDECTOMY     CARPAL TUNNEL RELEASE Right 08/01/2015   Procedure: RIGHT CARPAL TUNNEL RELEASE;  Surgeon: Taft FORBES Minerva, MD;  Location: [facility];  Service: Orthopedics;  Laterality: Right;   CERVICAL POLYPECTOMY  09/14/2017   Procedure: ENDOMETRIAL POLYPECTOMY;  Surgeon: Jayne Vonn DEL, MD;  Location: [facility];  Service: Gynecology;;   COLONOSCOPY N/A 06/15/2017   Procedure: COLONOSCOPY;  Surgeon: Golda Claudis PENNER, MD;  Location: [facility];  Service: Endoscopy;  Laterality: N/A;  830   ENDOMETRIAL ABLATION N/A 09/14/2017   Procedure: ENDOMETRIAL ABLATION( Minerva);  Surgeon: Jayne Vonn DEL, MD;  Location: [facility];  Service: Gynecology;  Laterality: N/A;    ESOPHAGOGASTRODUODENOSCOPY N/A 05/25/2013   Procedure: ESOPHAGOGASTRODUODENOSCOPY (EGD);  Surgeon: Claudis PENNER Golda, MD;  Location: [facility];  Service: Endoscopy;  Laterality: N/A;  220   GASTRIC BYPASS     HYSTEROSCOPY WITH D & C N/A 09/14/2017   Procedure: DILATATION AND CURETTAGE /HYSTEROSCOPY;  Surgeon: Jayne Vonn DEL, MD;  Location: [facility];  Service: Gynecology;  Laterality: N/A;   TOTAL KNEE ARTHROPLASTY Left 05/11/2023   Procedure: TOTAL KNEE ARTHROPLASTY;  Surgeon: Minerva Taft FORBES, MD;  Location: [facility];  Service: Orthopedics;  Laterality: Left;   TOTAL KNEE ARTHROPLASTY Right 06/12/2024   Procedure: ARTHROPLASTY, KNEE, TOTAL;  Surgeon: Minerva Taft FORBES, MD;  Location: [facility];  Service: Orthopedics;  Laterality: Right;   TUBAL LIGATION     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  factor (HCC) 08/28/2020   High risk medication use 08/28/2020   Neck pain 08/19/2020   Dermatomycosis 07/01/2020   Hiatal hernia 07/17/2019   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   Endometrial polyp 08/09/2017   Fibroids 08/09/2017   Dyslipidemia 06/27/2017   Leg edema 03/08/2017   GAD (generalized anxiety disorder) 06/01/2016   Metabolic syndrome X 07/08/2013   Erosive esophagitis 06/25/2013   GERD (gastroesophageal reflux disease) 03/22/2013   Left knee pain 03/22/2013   Snoring 08/05/2011   Vitamin D  deficiency 03/21/2011   Allergic rhinitis 09/20/2010   MORBID OBESITY 02/11/2010   Essential  hypertension 02/11/2010    PCP: Antonetta Rollene BRAVO, MD   REFERRING PROVIDER: Margrette Taft BRAVO, MD   REFERRING DIAG: Primary osteoarthritis of right knee   THERAPY DIAG:  Status post right knee replacement  Acute pain of right knee  Stiffness of right knee, not elsewhere classified  Localized edema  Rationale for Evaluation and Treatment: Rehabilitation  ONSET DATE: 06/12/24  SUBJECTIVE:   SUBJECTIVE STATEMENT: Patient reports that she was able to get her CPM to 110 degrees yesterday. Her knee is mainly stiff today.    EVAL: Patient reports that she is feeling pretty good today. She had a right total knee replacement on 06/12/24. She had home health PT about 4 times since surgery with their last visit being yesterday. She was given a HEP which she was able to recall and demonstrate. She goes to see her surgeon later today. She has been trying to slowly progress from her walker to the cane.   PERTINENT HISTORY: Hypertension, RA, OA, diabetes, neuropathy, and history of seizures PAIN:  Are you having pain? Yes: NPRS scale: Current: 6.5/10 Best: 5/10 Worst: 10/10 Pain location: right knee Pain description: sharp  Aggravating factors: transfers and stairs  Relieving factors: ice  PRECAUTIONS: None  RED FLAGS: None   WEIGHT BEARING RESTRICTIONS: No  FALLS:  Has patient fallen in last 6 months? No  LIVING ENVIRONMENT: Lives with: lives with their family Lives in: House/apartment Stairs: Yes: External: 2 steps; on right going up; step to pattern Has following equipment at home: Quad cane small base and Walker - 2 wheeled  OCCUPATION: not working currently; scheduled to return 09/05/24  PLOF: Independent  PATIENT GOALS: be able to walk normal   NEXT MD VISIT: 06/26/24  OBJECTIVE:  Note: Objective measures were completed at Evaluation unless otherwise noted.  DIAGNOSTIC FINDINGS: 06/12/24 right knee x-ray  IMPRESSION: Status post tricompartmental knee  replacement without evidence for immediate hardware complication. PATIENT SURVEYS:  LEFS  Extreme difficulty/unable (0), Quite a bit of difficulty (1), Moderate difficulty (2), Little difficulty (3), No difficulty (4) Survey date:  06/26/24  Any of your usual work, housework or school activities 1  2. Usual hobbies, recreational or sporting activities 4  3. Getting into/out of the bath 0  4. Walking between rooms 4  5. Putting on socks/shoes 3  6. Squatting  0  7. Lifting an object, like a bag of groceries from the floor 4  8. Performing light activities around your home 3  9. Performing heavy activities around your home 0  10. Getting into/out of a car 2  11. Walking 2 blocks 0  12. Walking 1 mile 0  13. Going up/down 10 stairs (1 flight) 0  14. Standing for 1 hour 2  15.  sitting for 1 hour 4  16. Running on even ground 0  17. Running on uneven ground 0  18. Making  sharp turns while running fast 0  19. Hopping  0  20. Rolling over in bed 0  Score total:  27/80     COGNITION: Overall cognitive status: Within functional limits for tasks assessed     SENSATION: Patient reports numbness along her right lateral tibiofemoral joint line  EDEMA:  Circumferential: Right tibiofemoral joint line: 57.2 cm  Left tibiofemoral joint line: 48.2 cm   PALPATION: TTP: right hip adductors  LOWER EXTREMITY ROM:  Active ROM Right eval Left eval  Hip flexion    Hip extension    Hip abduction    Hip adduction    Hip internal rotation    Hip external rotation    Knee flexion 62 PROM: 68  107  Knee extension 3 0  Ankle dorsiflexion    Ankle plantarflexion    Ankle inversion    Ankle eversion     (Blank rows = not tested)  LOWER EXTREMITY MMT: not tested due to surgical condition  FUNCTIONAL TESTS:  2 minute walk test: 268 feet with RW   GAIT: Distance walked: 268 Assistive device utilized: [employer]ant - 2 wheeled Level of assistance: Modified independence Comments: decreased  gait speed and stride length                                                                                                                                TREATMENT DATE:                                     07/06/24 EXERCISE LOG  Exercise Repetitions and Resistance Comments  Nustep  L1 x 8 minutes @ seat 10   Manual therapy  STM to right quadriceps Patellar joint mobilizations grade I-II Tibiofemoral grade I-III for knee flexion 87 degrees knee flexion prior to manual therapy  90 degrees knee flexion  after manual therapy   LAQ 20 reps  RLE only   Marching on foam   2 minutes  BUE support from parallel bars   Static stance on foam   2 minutes  Infrequent UE support   Standing gastroc stretch  4 x 30 seconds    Sit to stand  10 reps  With armrests; required cueing for proper foot positioning   Blank cell = exercise not performed today   07/02/24:  Review of goals AAROM Heel slides with straps, 10 holds, x10 Quad set, heel on half foam roll, 10 holds, 10x Supine SAQ 2x10 Supine hamstring curls. Feet on physio ball, 2x10 Seated LAQ, 15x STS, 10x, encouraged no UE use but pt fearful, completes only 1 w/o UE     06/26/24: PT evaluation, patient education, and reviewed HEP   PATIENT EDUCATION:  Education details: POC, healing, objective findings, anatomy, reviewed HEP, and goals for physical therapy Person educated: Patient Education method: Explanation Education comprehension: verbalized understanding  HOME EXERCISE PROGRAM:  Reviewed HH PT HEP  ASSESSMENT:  CLINICAL IMPRESSION: Today's treatment focused on improved knee mobility through the use of new and familiar interventions. She required minimal cueing with today's new interventions for proper exercise performance. She experienced no significant increase in pain or discomfort with any of today's interventions. She reported that her knee felt alright upon the conclusion of treatment. Patient continues to require skilled  physical therapy to address her remaining impairments to return to her prior level of function.     EVAL: Patient is a 59 y.o. female who was seen today for physical therapy evaluation and treatment following a right total knee arthroplasty on 06/12/24.  She presented with moderate pain severity and irritability with right knee flexion being the most aggravating to her familiar symptoms.  She exhibited increased right knee edema.  However, she exhibited no other signs or symptoms of a postoperative complication. Her HEP was reviewed and she was encouraged to continue with these interventions. Recommend that she continue with skilled physical therapy to address her impairments to return to her prior level of function.   OBJECTIVE IMPAIRMENTS: Abnormal gait, decreased activity tolerance, decreased mobility, difficulty walking, decreased ROM, decreased strength, hypomobility, increased edema, impaired sensation, impaired tone, and pain.   ACTIVITY LIMITATIONS: lifting, standing, squatting, stairs, transfers, bathing, dressing, and locomotion level  PARTICIPATION LIMITATIONS: cleaning, laundry, driving, shopping, community activity, and occupation  PERSONAL FACTORS: Past/current experiences, Transportation, and 3+ comorbidities: Hypertension, RA, OA, diabetes, neuropathy, and history of seizures are also affecting patient's functional outcome.   REHAB POTENTIAL: Good  CLINICAL DECISION MAKING: Evolving/moderate complexity  EVALUATION COMPLEXITY: Moderate   GOALS: Goals reviewed with patient? Yes  SHORT TERM GOALS: Target date: 07/17/24 Patient will be independent with her initial HEP.  Baseline:  Goal status: INITIAL  2.  Patient will improve her active right knee flexion to at least 90 degrees for improved right knee mobility.  Baseline:  Goal status: INITIAL  3.  Patient will be able to ambulate at least 80 feet without an assistive device for improved household mobility. Baseline:   Goal status: INITIAL  LONG TERM GOALS: Target date: 08/07/24  Patient will be independent with her advanced HEP.  Baseline:  Goal status: INITIAL  2.  Patient will improve her active right knee flexion to at least 115 degrees for improved function navigating stairs.  Baseline:  Goal status: INITIAL  3.  Patient will improve her LEFS score to at least 50/80 for improved perceived function with her daily activities. Baseline:  Goal status: INITIAL  4.  Patient will be able to ambulate at least 200 feet with no significant gait deviations. Baseline:  Goal status: INITIAL  5.  Patient will improve her 2-minute walk test distance by at least 100 feet for improved community mobility. Baseline:  Goal status: INITIAL  PLAN:  PT FREQUENCY: 2x/week  PT DURATION: 6 weeks  PLANNED INTERVENTIONS: 97164- PT Re-evaluation, 97750- Physical Performance Testing, 97110-Therapeutic exercises, 97530- Therapeutic activity, 97112- Neuromuscular re-education, 97535- Self Care, [id number]- Manual therapy, [phone]- Gait training, Patient/Family education, Balance training, Stair training, Joint mobilization, Cryotherapy, and Moist heat  PLAN FOR NEXT SESSION: manual therapy, lower extremity strengthening, balance interventions, and gait training  Lacinda Fass, PT, DPT  3:12 PM, 07/06/2024  "

## 2024-07-09 ENCOUNTER — Encounter (HOSPITAL_COMMUNITY): Payer: Self-pay

## 2024-07-09 ENCOUNTER — Ambulatory Visit (HOSPITAL_COMMUNITY)

## 2024-07-09 DIAGNOSIS — Z96651 Presence of right artificial knee joint: Secondary | ICD-10-CM

## 2024-07-09 DIAGNOSIS — M25561 Pain in right knee: Secondary | ICD-10-CM

## 2024-07-09 DIAGNOSIS — M25661 Stiffness of right knee, not elsewhere classified: Secondary | ICD-10-CM

## 2024-07-09 NOTE — Therapy (Signed)
 " OUTPATIENT PHYSICAL THERAPY LOWER EXTREMITY TREATMENT   Patient Name: Julie Ryan MRN: [id number] DOB:[birth year], 59 y.o., female Today's Date: 07/09/2024  END OF SESSION:  PT End of Session - 07/09/24 1342     Visit Number 4    Number of Visits 12    Date for Recertification  08/31/24    Authorization Type [employer]    Authorization Time Period no authorization required    Progress Note Due on Visit 10    PT Start Time 1337    PT Stop Time 1415    PT Time Calculation (min) 38 min    Activity Tolerance Patient tolerated treatment well    Behavior During Therapy [facility] for tasks assessed/performed           Past Medical History:  Diagnosis Date   Angio-edema    Anxiety    Arthritis    Phreesia 08/19/2020   Carpal tunnel syndrome of right wrist    Chronic knee pain    Depression    Diabetes mellitus without complication (HCC)    Endometrial polyp 08/09/2017   will get appt with Dr Jayne   Fibroids 08/09/2017   Has multiple small fibroids    GERD (gastroesophageal reflux disease)    Hypertension    Neuropathy    Obesity    Prediabetes    Seizures (HCC)    1 seizure as a chold; unknown etiology and has never been on meds   Sleep apnea    Past Surgical History:  Procedure Laterality Date   APPENDECTOMY     CARPAL TUNNEL RELEASE Right 08/01/2015   Procedure: RIGHT CARPAL TUNNEL RELEASE;  Surgeon: Taft FORBES Minerva, MD;  Location: [facility];  Service: Orthopedics;  Laterality: Right;   CERVICAL POLYPECTOMY  09/14/2017   Procedure: ENDOMETRIAL POLYPECTOMY;  Surgeon: Jayne Vonn DEL, MD;  Location: [facility];  Service: Gynecology;;   COLONOSCOPY N/A 06/15/2017   Procedure: COLONOSCOPY;  Surgeon: Golda Claudis PENNER, MD;  Location: [facility];  Service: Endoscopy;  Laterality: N/A;  830   ENDOMETRIAL ABLATION N/A 09/14/2017   Procedure: ENDOMETRIAL ABLATION( Minerva);  Surgeon: Jayne Vonn DEL, MD;  Location: [facility];  Service: Gynecology;  Laterality: N/A;    ESOPHAGOGASTRODUODENOSCOPY N/A 05/25/2013   Procedure: ESOPHAGOGASTRODUODENOSCOPY (EGD);  Surgeon: Claudis PENNER Golda, MD;  [facility]: [facility];  Service: Endoscopy;  Laterality: N/A;  220   GASTRIC BYPASS     HYSTEROSCOPY WITH D & C N/A 09/14/2017   Procedure: DILATATION AND CURETTAGE /HYSTEROSCOPY;  Surgeon: Jayne Vonn DEL, MD;  Location: [facility];  Service: Gynecology;  Laterality: N/A;   TOTAL KNEE ARTHROPLASTY Left 05/11/2023   Procedure: TOTAL KNEE ARTHROPLASTY;  Surgeon: Minerva Taft FORBES, MD;  Location: [facility];  Service: Orthopedics;  Laterality: Left;   TOTAL KNEE ARTHROPLASTY Right 06/12/2024   Procedure: ARTHROPLASTY, KNEE, TOTAL;  Surgeon: Minerva Taft FORBES, MD;  Location: [facility];  Service: Orthopedics;  Laterality: Right;   TUBAL LIGATION     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  factor (HCC) 08/28/2020   High risk medication use 08/28/2020   Neck pain 08/19/2020   Dermatomycosis 07/01/2020   Hiatal hernia 07/17/2019   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   Endometrial polyp 08/09/2017   Fibroids 08/09/2017   Dyslipidemia 06/27/2017   Leg edema 03/08/2017   GAD (generalized anxiety disorder) 06/01/2016   Metabolic syndrome X 07/08/2013   Erosive esophagitis 06/25/2013   GERD (gastroesophageal reflux disease) 03/22/2013   Left knee pain 03/22/2013   Snoring 08/05/2011   Vitamin D  deficiency 03/21/2011   Allergic rhinitis 09/20/2010   MORBID OBESITY 02/11/2010   Essential  hypertension 02/11/2010    PCP: Antonetta Rollene BRAVO, MD   REFERRING PROVIDER: Margrette Taft BRAVO, MD   REFERRING DIAG: Primary osteoarthritis of right knee   THERAPY DIAG:  Status post right knee replacement  Acute pain of right knee  Stiffness of right knee, not elsewhere classified  Rationale for Evaluation and Treatment: Rehabilitation  ONSET DATE: 06/12/24  SUBJECTIVE:   SUBJECTIVE STATEMENT: Pt arrives ambulating with SPC. Reports 6.5/10 for pain today. Reports mostly stiffness, reports no falls/tripping since using SPC.    EVAL: Patient reports that she is feeling pretty good today. She had a right total knee replacement on 06/12/24. She had home health PT about 4 times since surgery with their last visit being yesterday. She was given a HEP which she was able to recall and demonstrate. She goes to see her surgeon later today. She has been trying to slowly progress from her walker to the cane.   PERTINENT HISTORY: Hypertension, RA, OA, diabetes, neuropathy, and history of seizures PAIN:  Are you having pain? Yes: NPRS scale: Current: 6.5/10 Best: 5/10 Worst: 10/10 Pain location: right knee Pain description: sharp  Aggravating factors: transfers and stairs  Relieving factors: ice  PRECAUTIONS: None  RED FLAGS: None   WEIGHT BEARING RESTRICTIONS: No  FALLS:  Has patient fallen in last 6 months? No  LIVING ENVIRONMENT: Lives with: lives with their family Lives in: House/apartment Stairs: Yes: External: 2 steps; on right going up; step to pattern Has following equipment at home: Quad cane small base and Walker - 2 wheeled  OCCUPATION: not working currently; scheduled to return 09/05/24  PLOF: Independent  PATIENT GOALS: be able to walk normal   NEXT MD VISIT: 06/26/24  OBJECTIVE:  Note: Objective measures were completed at Evaluation unless otherwise noted.  DIAGNOSTIC FINDINGS: 06/12/24 right knee x-ray  IMPRESSION: Status post tricompartmental knee  replacement without evidence for immediate hardware complication. PATIENT SURVEYS:  LEFS  Extreme difficulty/unable (0), Quite a bit of difficulty (1), Moderate difficulty (2), Little difficulty (3), No difficulty (4) Survey date:  06/26/24  Any of your usual work, housework or school activities 1  2. Usual hobbies, recreational or sporting activities 4  3. Getting into/out of the bath 0  4. Walking between rooms 4  5. Putting on socks/shoes 3  6. Squatting  0  7. Lifting an object, like a bag of groceries from the floor 4  8. Performing light activities around your home 3  9. Performing heavy activities around your home 0  10. Getting into/out of a car 2  11. Walking 2 blocks 0  12. Walking 1 mile 0  13. Going up/down 10 stairs (1 flight) 0  14. Standing for 1 hour 2  15.  sitting for 1 hour 4  16. Running on even ground 0  17. Running on uneven ground 0  18. Making sharp turns while running  fast 0  19. Hopping  0  20. Rolling over in bed 0  Score total:  27/80     COGNITION: Overall cognitive status: Within functional limits for tasks assessed     SENSATION: Patient reports numbness along her right lateral tibiofemoral joint line  EDEMA:  Circumferential: Right tibiofemoral joint line: 57.2 cm  Left tibiofemoral joint line: 48.2 cm   PALPATION: TTP: right hip adductors  LOWER EXTREMITY ROM:  Active ROM Right eval Left eval  Hip flexion    Hip extension    Hip abduction    Hip adduction    Hip internal rotation    Hip external rotation    Knee flexion 62 PROM: 68  107  Knee extension 3 0  Ankle dorsiflexion    Ankle plantarflexion    Ankle inversion    Ankle eversion     (Blank rows = not tested)  LOWER EXTREMITY MMT: not tested due to surgical condition  FUNCTIONAL TESTS:  2 minute walk test: 268 feet with RW   GAIT: Distance walked: 268 Assistive device utilized: [employer]ant - 2 wheeled Level of assistance: Modified independence Comments: decreased  gait speed and stride length                                                                                                                                TREATMENT DATE:   07/09/24:  Recumbent bike, seat 15, 4', half revolutions STS, v cues for foot placement, and dec UE support, 2x10, intermittent UE use throughout due to inc pain Knee Drives to max extension, 8 inch step, 10x, 10 holds Gastroc stretch on incline, 3x30 Seated LAQ, 3 lb. AW, 2x10  Heel raises on incline, 2x15 Toe raises on decline, 2x15                                    07/06/24 EXERCISE LOG  Exercise Repetitions and Resistance Comments  Nustep  L1 x 8 minutes @ seat 10   Manual therapy  STM to right quadriceps Patellar joint mobilizations grade I-II Tibiofemoral grade I-III for knee flexion 87 degrees knee flexion prior to manual therapy  90 degrees knee flexion  after manual therapy   LAQ 20 reps  RLE only   Marching on foam   2 minutes  BUE support from parallel bars   Static stance on foam   2 minutes  Infrequent UE support   Standing gastroc stretch  4 x 30 seconds    Sit to stand  10 reps  With armrests; required cueing for proper foot positioning   Blank cell = exercise not performed today   07/02/24:  Review of goals AAROM Heel slides with straps, 10 holds, x10 Quad set, heel on half foam roll, 10 holds, 10x Supine SAQ 2x10 Supine hamstring curls. Feet on physio ball, 2x10 Seated LAQ, 15x STS,  10x, encouraged no UE use but pt fearful, completes only 1 w/o UE    PATIENT EDUCATION:  Education details: POC, healing, objective findings, anatomy, reviewed HEP, and goals for physical therapy Person educated: Patient Education method: Explanation Education comprehension: verbalized understanding  HOME EXERCISE PROGRAM: Reviewed HH PT HEP  ASSESSMENT:  CLINICAL IMPRESSION: Began session on bike for R knee mobility. Pt able to complete half revolutions with inc pain. Followed with strengthening  via STS. Pt continues to have difficulty performing consistent STS without UE use due to inc pain in R knee. Remainder of session spent focused on mobility and LE strengthening in sitting and standing position. Pt reports stiffness and fatigue at end of session but overall tolerates well. Encouraged  walking program. Patient continues to require skilled physical therapy to address her remaining impairments to return to her prior level of function.      EVAL: Patient is a 59 y.o. female who was seen today for physical therapy evaluation and treatment following a right total knee arthroplasty on 06/12/24.  She presented with moderate pain severity and irritability with right knee flexion being the most aggravating to her familiar symptoms.  She exhibited increased right knee edema.  However, she exhibited no other signs or symptoms of a postoperative complication. Her HEP was reviewed and she was encouraged to continue with these interventions. Recommend that she continue with skilled physical therapy to address her impairments to return to her prior level of function.   OBJECTIVE IMPAIRMENTS: Abnormal gait, decreased activity tolerance, decreased mobility, difficulty walking, decreased ROM, decreased strength, hypomobility, increased edema, impaired sensation, impaired tone, and pain.   ACTIVITY LIMITATIONS: lifting, standing, squatting, stairs, transfers, bathing, dressing, and locomotion level  PARTICIPATION LIMITATIONS: cleaning, laundry, driving, shopping, community activity, and occupation  PERSONAL FACTORS: Past/current experiences, Transportation, and 3+ comorbidities: Hypertension, RA, OA, diabetes, neuropathy, and history of seizures are also affecting patient's functional outcome.   REHAB POTENTIAL: Good  CLINICAL DECISION MAKING: Evolving/moderate complexity  EVALUATION COMPLEXITY: Moderate   GOALS: Goals reviewed with patient? Yes  SHORT TERM GOALS: Target date: 07/17/24 Patient  will be independent with her initial HEP.  Baseline:  Goal status: INITIAL  2.  Patient will improve her active right knee flexion to at least 90 degrees for improved right knee mobility.  Baseline:  Goal status: INITIAL  3.  Patient will be able to ambulate at least 80 feet without an assistive device for improved household mobility. Baseline:  Goal status: INITIAL  LONG TERM GOALS: Target date: 08/07/24  Patient will be independent with her advanced HEP.  Baseline:  Goal status: INITIAL  2.  Patient will improve her active right knee flexion to at least 115 degrees for improved function navigating stairs.  Baseline:  Goal status: INITIAL  3.  Patient will improve her LEFS score to at least 50/80 for improved perceived function with her daily activities. Baseline:  Goal status: INITIAL  4.  Patient will be able to ambulate at least 200 feet with no significant gait deviations. Baseline:  Goal status: INITIAL  5.  Patient will improve her 2-minute walk test distance by at least 100 feet for improved community mobility. Baseline:  Goal status: INITIAL  PLAN:  PT FREQUENCY: 2x/week  PT DURATION: 6 weeks  PLANNED INTERVENTIONS: 97164- PT Re-evaluation, 97750- Physical Performance Testing, 97110-Therapeutic exercises, 97530- Therapeutic activity, [id number]- Neuromuscular re-education, 97535- Self Care, [id number]- Manual therapy, [phone]- Gait training, Patient/Family education, Balance training, Stair training, Joint mobilization, Cryotherapy, and Moist heat  PLAN FOR NEXT SESSION: manual therapy, lower extremity strengthening, balance interventions, and gait training   2:18 PM, 07/09/2024 Steffen Hase Powell-Butler, PT, DPT [facility] [place]  "

## 2024-07-10 ENCOUNTER — Telehealth: Payer: Self-pay | Admitting: Orthopedic Surgery

## 2024-07-10 NOTE — Telephone Encounter (Signed)
 Patient came by [facility] stating her medical records needs to be faxed for her FMLA. IC,lmvm advising that we can't release the medical records without valid auth. The auth that we have is blank where the name of the insurance company is supposed to be. Advised pt to go by [facility] to complete a new auth and to be sure to write in the name of company that she is authorizing us  to release to. Once this is received, I can fax the medical records.

## 2024-07-12 ENCOUNTER — Encounter (HOSPITAL_COMMUNITY): Payer: Self-pay

## 2024-07-12 ENCOUNTER — Ambulatory Visit (HOSPITAL_COMMUNITY)

## 2024-07-16 ENCOUNTER — Encounter (HOSPITAL_COMMUNITY): Payer: Self-pay

## 2024-07-16 ENCOUNTER — Ambulatory Visit (HOSPITAL_COMMUNITY)

## 2024-07-16 DIAGNOSIS — Z96651 Presence of right artificial knee joint: Secondary | ICD-10-CM | POA: Diagnosis not present

## 2024-07-16 DIAGNOSIS — M25661 Stiffness of right knee, not elsewhere classified: Secondary | ICD-10-CM

## 2024-07-16 DIAGNOSIS — M25561 Pain in right knee: Secondary | ICD-10-CM

## 2024-07-16 NOTE — Therapy (Signed)
 " OUTPATIENT PHYSICAL THERAPY LOWER EXTREMITY TREATMENT   Patient Name: Julie Ryan MRN: [id number] DOB:Jul 27, 1965, 59 y.o., female Today's Date: 07/16/2024  END OF SESSION:  PT End of Session - 07/16/24 1335     Visit Number 5    Number of Visits 12    Date for Recertification  08/31/24    Authorization Type [employer]    Authorization Time Period no authorization required    Progress Note Due on Visit 10    PT Start Time 1335    PT Stop Time 1415    PT Time Calculation (min) 40 min    Activity Tolerance Patient tolerated treatment well    Behavior During Therapy [facility] for tasks assessed/performed           Past Medical History:  Diagnosis Date   Angio-edema    Anxiety    Arthritis    [employer]reesia 08/19/2020   Carpal tunnel syndrome of right wrist    Chronic knee pain    Depression    Diabetes mellitus without complication (HCC)    Endometrial polyp 08/09/2017   will get appt with Dr Jayne   Fibroids 08/09/2017   Has multiple small fibroids    GERD (gastroesophageal reflux disease)    Hypertension    Neuropathy    Obesity    Prediabetes    Seizures (HCC)    1 seizure as a chold; unknown etiology and has never been on meds   Sleep apnea    Past Surgical History:  Procedure Laterality Date   APPENDECTOMY     CARPAL TUNNEL RELEASE Right 08/01/2015   Procedure: RIGHT CARPAL TUNNEL RELEASE;  Surgeon: Taft FORBES Minerva, MD;  Location: [facility];  Service: Orthopedics;  Laterality: Right;   CERVICAL POLYPECTOMY  09/14/2017   Procedure: ENDOMETRIAL POLYPECTOMY;  Surgeon: Jayne Vonn DEL, MD;  Location: [facility];  Service: Gynecology;;   COLONOSCOPY N/A 06/15/2017   Procedure: COLONOSCOPY;  Surgeon: Golda Claudis PENNER, MD;  Location: [facility];  Service: Endoscopy;  Laterality: N/A;  830   ENDOMETRIAL ABLATION N/A 09/14/2017   Procedure: ENDOMETRIAL ABLATION( Minerva);  Surgeon: Jayne Vonn DEL, MD;  Location: [facility];  Service: Gynecology;  Laterality: N/A;    ESOPHAGOGASTRODUODENOSCOPY N/A 05/25/2013   Procedure: ESOPHAGOGASTRODUODENOSCOPY (EGD);  Surgeon: Claudis PENNER Golda, MD;  Location: [facility];  Service: Endoscopy;  Laterality: N/A;  220   GASTRIC BYPASS     HYSTEROSCOPY WITH D & C N/A 09/14/2017   Procedure: DILATATION AND CURETTAGE /HYSTEROSCOPY;  Surgeon: Jayne Vonn DEL, MD;  Location: [facility];  Service: Gynecology;  Laterality: N/A;   TOTAL KNEE ARTHROPLASTY Left 05/11/2023   Procedure: TOTAL KNEE ARTHROPLASTY;  Surgeon: Minerva Taft FORBES, MD;  Location: [facility];  Service: Orthopedics;  Laterality: Left;   TOTAL KNEE ARTHROPLASTY Right 06/12/2024   Procedure: ARTHROPLASTY, KNEE, TOTAL;  Surgeon: Minerva Taft FORBES, MD;  Location: [facility];  Service: Orthopedics;  Laterality: Right;   TUBAL LIGATION     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  factor (HCC) 08/28/2020   High risk medication use 08/28/2020   Neck pain 08/19/2020   Dermatomycosis 07/01/2020   Hiatal hernia 07/17/2019   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   Endometrial polyp 08/09/2017   Fibroids 08/09/2017   Dyslipidemia 06/27/2017   Leg edema 03/08/2017   GAD (generalized anxiety disorder) 06/01/2016   Metabolic syndrome X 07/08/2013   Erosive esophagitis 06/25/2013   GERD (gastroesophageal reflux disease) 03/22/2013   Left knee pain 03/22/2013   Snoring 08/05/2011   Vitamin D  deficiency 03/21/2011   Allergic rhinitis 09/20/2010   MORBID OBESITY 02/11/2010   Essential  hypertension 02/11/2010    PCP: Antonetta Rollene BRAVO, MD   REFERRING PROVIDER: Margrette Taft BRAVO, MD   REFERRING DIAG: Primary osteoarthritis of right knee   THERAPY DIAG:  Status post right knee replacement  Acute pain of right knee  Stiffness of right knee, not elsewhere classified  Rationale for Evaluation and Treatment: Rehabilitation  ONSET DATE: 06/12/24  SUBJECTIVE:   SUBJECTIVE STATEMENT: Reports 6.5/10 for stiffness today. Using [facility] during ambulation.   EVAL: Patient reports that she is feeling pretty good today. She had a right total knee replacement on 06/12/24. She had home health PT about 4 times since surgery with their last visit being yesterday. She was given a HEP which she was able to recall and demonstrate. She goes to see her surgeon later today. She has been trying to slowly progress from her walker to the cane.   PERTINENT HISTORY: Hypertension, RA, OA, diabetes, neuropathy, and history of seizures PAIN:  Are you having pain? Yes: NPRS scale: Current: 6.5/10 Best: 5/10 Worst: 10/10 Pain location: right knee Pain description: sharp  Aggravating factors: transfers and stairs  Relieving factors: ice  PRECAUTIONS: None  RED FLAGS: None   WEIGHT BEARING RESTRICTIONS: No  FALLS:  Has patient fallen in last 6 months? No  LIVING ENVIRONMENT: Lives with: lives with their family Lives in: House/apartment Stairs: Yes: External: 2 steps; on right going up; step to pattern Has following equipment at home: Quad cane small base and Walker - 2 wheeled  OCCUPATION: not working currently; scheduled to return 09/05/24  PLOF: Independent  PATIENT GOALS: be able to walk normal   NEXT MD VISIT: 06/26/24  OBJECTIVE:  Note: Objective measures were completed at Evaluation unless otherwise noted.  DIAGNOSTIC FINDINGS: 06/12/24 right knee x-ray  IMPRESSION: Status post tricompartmental knee replacement without evidence for immediate hardware  complication. PATIENT SURVEYS:  LEFS  Extreme difficulty/unable (0), Quite a bit of difficulty (1), Moderate difficulty (2), Little difficulty (3), No difficulty (4) Survey date:  06/26/24  Any of your usual work, housework or school activities 1  2. Usual hobbies, recreational or sporting activities 4  3. Getting into/out of the bath 0  4. Walking between rooms 4  5. Putting on socks/shoes 3  6. Squatting  0  7. Lifting an object, like a bag of groceries from the floor 4  8. Performing light activities around your home 3  9. Performing heavy activities around your home 0  10. Getting into/out of a car 2  11. Walking 2 blocks 0  12. Walking 1 mile 0  13. Going up/down 10 stairs (1 flight) 0  14. Standing for 1 hour 2  15.  sitting for 1 hour 4  16. Running on even ground 0  17. Running on uneven ground 0  18. Making sharp turns while running fast 0  19. Hopping  0  20. Rolling over  in bed 0  Score total:  27/80     COGNITION: Overall cognitive status: Within functional limits for tasks assessed     SENSATION: Patient reports numbness along her right lateral tibiofemoral joint line  EDEMA:  Circumferential: Right tibiofemoral joint line: 57.2 cm  Left tibiofemoral joint line: 48.2 cm   PALPATION: TTP: right hip adductors  LOWER EXTREMITY ROM:  Active ROM Right eval Left eval Left 07/16/24  Hip flexion     Hip extension     Hip abduction     Hip adduction     Hip internal rotation     Hip external rotation     Knee flexion 62 PROM: 68  107 92  Knee extension 3 0 0  Ankle dorsiflexion     Ankle plantarflexion     Ankle inversion     Ankle eversion      (Blank rows = not tested)  LOWER EXTREMITY MMT: not tested due to surgical condition  FUNCTIONAL TESTS:  2 minute walk test: 268 feet with RW   GAIT: Distance walked: 268 Assistive device utilized: [employer] - 2 wheeled Level of assistance: Modified independence Comments: decreased gait speed and stride  length                                                                                                                                TREATMENT DATE:   07/16/24: Recumbent bike, seat 15, 4', half revolutions but does one full revolution with pain  Knee Drives to max extension, 8 inch step, 10x, 10 holds Forward Step ups, 4 inch step, no UE use, 2x10 Lateral step overs, 4 inch step, 2x10 Lateral heel taps, 2 inch step, 2x10 Obstacle clearance, two 6 inch and 2 12 inch, 3x down and back, slight posterior lean to clear at times  STS, foam pad in chair, 5x, no UE use AROM measure: 0-92    07/09/24:  Recumbent bike, seat 15, 4', half revolutions STS, v cues for foot placement, and dec UE support, 2x10, intermittent UE use throughout due to inc pain Knee Drives to max extension, 8 inch step, 10x, 10 holds Gastroc stretch on incline, 3x30 Seated LAQ, 3 lb. AW, 2x10  Heel raises on incline, 2x15 Toe raises on decline, 2x15                                    07/06/24 EXERCISE LOG  Exercise Repetitions and Resistance Comments  Nustep  L1 x 8 minutes @ seat 10   Manual therapy  STM to right quadriceps Patellar joint mobilizations grade I-II Tibiofemoral grade I-III for knee flexion 87 degrees knee flexion prior to manual therapy  90 degrees knee flexion  after manual therapy   LAQ 20 reps  RLE only   Marching on foam   2 minutes  BUE support from parallel bars  Static stance on foam   2 minutes  Infrequent UE support   Standing gastroc stretch  4 x 30 seconds    Sit to stand  10 reps  With armrests; required cueing for proper foot positioning   Blank cell = exercise not performed today     PATIENT EDUCATION:  Education details: POC, healing, objective findings, anatomy, reviewed HEP, and goals for physical therapy Person educated: Patient Education method: Explanation Education comprehension: verbalized understanding  HOME EXERCISE PROGRAM: Reviewed HH PT  HEP  ASSESSMENT:  CLINICAL IMPRESSION: Began session with focus on RLE mobility on recumbent bike with pt able to complete one full revolution with pain. Followed with continued mobility and LE strengthening in parallel bars. Pt able ascend stair with RLE without UE support and no overt LOB. Regressed step height during lateral heel taps activity due to RLE weakness. Difficulty of note with obstacle clearance, due to ROM restrictions. Ended with STS. Difficulty with standard chair height but pt able to complete with adding ~2 inch to seat height. ROM improving with pt able to flexion R knee to 92 degrees. And full extension. Patient continues to require skilled physical therapy to address her remaining impairments to return to her prior level of function.     EVAL: Patient is a 59 y.o. female who was seen today for physical therapy evaluation and treatment following a right total knee arthroplasty on 06/12/24.  She presented with moderate pain severity and irritability with right knee flexion being the most aggravating to her familiar symptoms.  She exhibited increased right knee edema.  However, she exhibited no other signs or symptoms of a postoperative complication. Her HEP was reviewed and she was encouraged to continue with these interventions. Recommend that she continue with skilled physical therapy to address her impairments to return to her prior level of function.   OBJECTIVE IMPAIRMENTS: Abnormal gait, decreased activity tolerance, decreased mobility, difficulty walking, decreased ROM, decreased strength, hypomobility, increased edema, impaired sensation, impaired tone, and pain.   ACTIVITY LIMITATIONS: lifting, standing, squatting, stairs, transfers, bathing, dressing, and locomotion level  PARTICIPATION LIMITATIONS: cleaning, laundry, driving, shopping, community activity, and occupation  PERSONAL FACTORS: Past/current experiences, Transportation, and 3+ comorbidities: Hypertension, RA,  OA, diabetes, neuropathy, and history of seizures are also affecting patient's functional outcome.   REHAB POTENTIAL: Good  CLINICAL DECISION MAKING: Evolving/moderate complexity  EVALUATION COMPLEXITY: Moderate   GOALS: Goals reviewed with patient? Yes  SHORT TERM GOALS: Target date: 07/17/24 Patient will be independent with her initial HEP.  Baseline:  Goal status: INITIAL  2.  Patient will improve her active right knee flexion to at least 90 degrees for improved right knee mobility.  Baseline:  Goal status: INITIAL  3.  Patient will be able to ambulate at least 80 feet without an assistive device for improved household mobility. Baseline:  Goal status: INITIAL  LONG TERM GOALS: Target date: 08/07/24  Patient will be independent with her advanced HEP.  Baseline:  Goal status: INITIAL  2.  Patient will improve her active right knee flexion to at least 115 degrees for improved function navigating stairs.  Baseline:  Goal status: INITIAL  3.  Patient will improve her LEFS score to at least 50/80 for improved perceived function with her daily activities. Baseline:  Goal status: INITIAL  4.  Patient will be able to ambulate at least 200 feet with no significant gait deviations. Baseline:  Goal status: INITIAL  5.  Patient will improve her 2-minute walk test distance by  at least 100 feet for improved community mobility. Baseline:  Goal status: INITIAL  PLAN:  PT FREQUENCY: 2x/week  PT DURATION: 6 weeks  PLANNED INTERVENTIONS: 97164- PT Re-evaluation, 97750- Physical Performance Testing, 97110-Therapeutic exercises, 97530- Therapeutic activity, 97112- Neuromuscular re-education, 97535- Self Care, [id number]- Manual therapy, [phone]- Gait training, Patient/Family education, Balance training, Stair training, Joint mobilization, Cryotherapy, and Moist heat  PLAN FOR NEXT SESSION: manual therapy, lower extremity strengthening, balance interventions, and gait training, add to HEP  next session    4:03 PM, 07/16/2024 Zulay Corrie Powell-Butler, PT, DPT [facility] [place] "

## 2024-07-19 ENCOUNTER — Telehealth: Payer: Self-pay | Admitting: Orthopedic Surgery

## 2024-07-19 ENCOUNTER — Encounter (HOSPITAL_COMMUNITY): Payer: Self-pay

## 2024-07-19 ENCOUNTER — Ambulatory Visit (HOSPITAL_COMMUNITY)

## 2024-07-19 DIAGNOSIS — M25661 Stiffness of right knee, not elsewhere classified: Secondary | ICD-10-CM

## 2024-07-19 DIAGNOSIS — M25561 Pain in right knee: Secondary | ICD-10-CM

## 2024-07-19 DIAGNOSIS — Z96651 Presence of right artificial knee joint: Secondary | ICD-10-CM

## 2024-07-19 NOTE — Therapy (Signed)
 " OUTPATIENT PHYSICAL THERAPY LOWER EXTREMITY TREATMENT   Patient Name: LYNNIE KOEHLER MRN: [id number] DOB:04-12-1966, 59 y.o., female Today's Date: 07/19/2024  END OF SESSION:  PT End of Session - 07/19/24 1335     Visit Number 6    Number of Visits 12    Date for Recertification  08/31/24    Authorization Type [employer]    Authorization Time Period no authorization required    Progress Note Due on Visit 10    PT Start Time 1335    PT Stop Time 1415    PT Time Calculation (min) 40 min    Activity Tolerance Patient tolerated treatment well    Behavior During Therapy [facility] for tasks assessed/performed           Past Medical History:  Diagnosis Date   Angio-edema    Anxiety    Arthritis    Phreesia 08/19/2020   Carpal tunnel syndrome of right wrist    Chronic knee pain    Depression    Diabetes mellitus without complication (HCC)    Endometrial polyp 08/09/2017   will get appt with Dr Jayne   Fibroids 08/09/2017   Has multiple small fibroids    GERD (gastroesophageal reflux disease)    Hypertension    Neuropathy    Obesity    Prediabetes    Seizures (HCC)    1 seizure as a chold; unknown etiology and has never been on meds   Sleep apnea    Past Surgical History:  Procedure Laterality Date   APPENDECTOMY     CARPAL TUNNEL RELEASE Right 08/01/2015   Procedure: RIGHT CARPAL TUNNEL RELEASE;  Surgeon: Taft FORBES Minerva, MD;  Location: [facility];  Service: Orthopedics;  Laterality: Right;   CERVICAL POLYPECTOMY  09/14/2017   Procedure: ENDOMETRIAL POLYPECTOMY;  Surgeon: Jayne Vonn DEL, MD;  Location: [facility];  Service: Gynecology;;   COLONOSCOPY N/A 06/15/2017   Procedure: COLONOSCOPY;  Surgeon: Golda Claudis PENNER, MD;  Location: [facility];  Service: Endoscopy;  Laterality: N/A;  830   ENDOMETRIAL ABLATION N/A 09/14/2017   Procedure: ENDOMETRIAL ABLATION( Minerva);  Surgeon: Jayne Vonn DEL, MD;  Location: [facility];  Service: Gynecology;  Laterality: N/A;    ESOPHAGOGASTRODUODENOSCOPY N/A 05/25/2013   Procedure: ESOPHAGOGASTRODUODENOSCOPY (EGD);  Surgeon: Claudis PENNER Golda, MD;  [facility]: [facility];  Service: Endoscopy;  Laterality: N/A;  220   GASTRIC BYPASS     HYSTEROSCOPY WITH D & C N/A 09/14/2017   Procedure: DILATATION AND CURETTAGE /HYSTEROSCOPY;  Surgeon: Jayne Vonn DEL, MD;  Location: [facility];  Service: Gynecology;  Laterality: N/A;   TOTAL KNEE ARTHROPLASTY Left 05/11/2023   Procedure: TOTAL KNEE ARTHROPLASTY;  Surgeon: Minerva Taft FORBES, MD;  Location: [facility];  Service: Orthopedics;  Laterality: Left;   TOTAL KNEE ARTHROPLASTY Right 06/12/2024   Procedure: ARTHROPLASTY, KNEE, TOTAL;  Surgeon: Minerva Taft FORBES, MD;  Location: [facility];  Service: Orthopedics;  Laterality: Right;   TUBAL LIGATION     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  factor (HCC) 08/28/2020   High risk medication use 08/28/2020   Neck pain 08/19/2020   Dermatomycosis 07/01/2020   Hiatal hernia 07/17/2019   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   Endometrial polyp 08/09/2017   Fibroids 08/09/2017   Dyslipidemia 06/27/2017   Leg edema 03/08/2017   GAD (generalized anxiety disorder) 06/01/2016   Metabolic syndrome X 07/08/2013   Erosive esophagitis 06/25/2013   GERD (gastroesophageal reflux disease) 03/22/2013   Left knee pain 03/22/2013   Snoring 08/05/2011   Vitamin D  deficiency 03/21/2011   Allergic rhinitis 09/20/2010   MORBID OBESITY 02/11/2010   Essential  hypertension 02/11/2010    PCP: Antonetta Rollene BRAVO, MD   REFERRING PROVIDER: Margrette Taft BRAVO, MD   REFERRING DIAG: Primary osteoarthritis of right knee   THERAPY DIAG:  Status post right knee replacement  Acute pain of right knee  Stiffness of right knee, not elsewhere classified  Rationale for Evaluation and Treatment: Rehabilitation  ONSET DATE: 06/12/24  SUBJECTIVE:   SUBJECTIVE STATEMENT: Reports 8.5/10 for stiffness today. Using [facility] during ambulation. Reports no true pain. Reports she hasn't been using her cane in home but continues to take it with her anytime she's out the house just in case.   EVAL: Patient reports that she is feeling pretty good today. She had a right total knee replacement on 06/12/24. She had home health PT about 4 times since surgery with their last visit being yesterday. She was given a HEP which she was able to recall and demonstrate. She goes to see her surgeon later today. She has been trying to slowly progress from her walker to the cane.   PERTINENT HISTORY: Hypertension, RA, OA, diabetes, neuropathy, and history of seizures PAIN:  Are you having pain? Yes: NPRS scale: Current: 6.5/10 Best: 5/10 Worst: 10/10 Pain location: right knee Pain description: sharp  Aggravating factors: transfers and stairs  Relieving factors: ice  PRECAUTIONS: None  RED FLAGS: None   WEIGHT BEARING RESTRICTIONS: No  FALLS:  Has patient fallen in last 6 months? No  LIVING ENVIRONMENT: Lives with: lives with their family Lives in: House/apartment Stairs: Yes: External: 2 steps; on right going up; step to pattern Has following equipment at home: Quad cane small base and Walker - 2 wheeled  OCCUPATION: not working currently; scheduled to return 09/05/24  PLOF: Independent  PATIENT GOALS: be able to walk normal   NEXT MD VISIT: 06/26/24  OBJECTIVE:  Note: Objective measures were completed at Evaluation unless otherwise noted.  DIAGNOSTIC FINDINGS:  06/12/24 right knee x-ray  IMPRESSION: Status post tricompartmental knee replacement without evidence for immediate hardware complication. PATIENT SURVEYS:  LEFS  Extreme difficulty/unable (0), Quite a bit of difficulty (1), Moderate difficulty (2), Little difficulty (3), No difficulty (4) Survey date:  06/26/24  Any of your usual work, housework or school activities 1  2. Usual hobbies, recreational or sporting activities 4  3. Getting into/out of the bath 0  4. Walking between rooms 4  5. Putting on socks/shoes 3  6. Squatting  0  7. Lifting an object, like a bag of groceries from the floor 4  8. Performing light activities around your home 3  9. Performing heavy activities around your home 0  10. Getting into/out of a car 2  11. Walking 2 blocks 0  12. Walking 1 mile 0  13. Going up/down 10 stairs (1 flight) 0  14. Standing for 1 hour 2  15.  sitting for 1 hour 4  16. Running on  even ground 0  17. Running on uneven ground 0  18. Making sharp turns while running fast 0  19. Hopping  0  20. Rolling over in bed 0  Score total:  27/80     COGNITION: Overall cognitive status: Within functional limits for tasks assessed     SENSATION: Patient reports numbness along her right lateral tibiofemoral joint line  EDEMA:  Circumferential: Right tibiofemoral joint line: 57.2 cm  Left tibiofemoral joint line: 48.2 cm   PALPATION: TTP: right hip adductors  LOWER EXTREMITY ROM:  Active ROM Right eval Left eval Left 07/16/24  Hip flexion     Hip extension     Hip abduction     Hip adduction     Hip internal rotation     Hip external rotation     Knee flexion 62 PROM: 68  107 92  Knee extension 3 0 0  Ankle dorsiflexion     Ankle plantarflexion     Ankle inversion     Ankle eversion      (Blank rows = not tested)  LOWER EXTREMITY MMT: not tested due to surgical condition  FUNCTIONAL TESTS:  2 minute walk test: 268 feet with RW   GAIT: Distance walked:  268 Assistive device utilized: [employer]ant - 2 wheeled Level of assistance: Modified independence Comments: decreased gait speed and stride length                                                                                                                                TREATMENT DATE:   07/19/24: Recumbent bike, seat 14, 4', half revolutions Seated knee flexion, washcloth under R foot, active assist from LLE, 10 holds, 10x STS from 20 inch height, no UE use, 10x Heel raises on incline, 2x15 Standing hamstring curl, 3 lb. AW, 2x10 LAQ, 3 lb. AW, 2x10, emphasis on ecc control Hip vectors: 3 lb. AW, 10x each direction, RLE only AROM: 0-100   07/16/24: Recumbent bike, seat 15, 4', half revolutions but does one full revolution with pain  Knee Drives to max extension, 8 inch step, 10x, 10 holds Forward Step ups, 4 inch step, no UE use, 2x10 Lateral step overs, 4 inch step, 2x10 Lateral heel taps, 2 inch step, 2x10 Obstacle clearance, two 6 inch and 2 12 inch, 3x down and back, slight posterior lean to clear at times  STS, foam pad in chair, 5x, no UE use AROM measure: 0-92     07/09/24:  Recumbent bike, seat 15, 4', half revolutions STS, v cues for foot placement, and dec UE support, 2x10, intermittent UE use throughout due to inc pain Knee Drives to max extension, 8 inch step, 10x, 10 holds Gastroc stretch on incline, 3x30 Seated LAQ, 3 lb. AW, 2x10  Heel raises on incline, 2x15 Toe raises on decline, 2x15   PATIENT EDUCATION:  Education details: POC, healing, objective findings, anatomy, reviewed HEP, and goals for physical therapy Person  educated: Patient Education method: Explanation Education comprehension: verbalized understanding  HOME EXERCISE PROGRAM: Access Code: [email or web address] URL: [email or web address] Date: 07/19/2024 Prepared by: Rosaria Powell-Butler  Exercises - Sit to Stand Without Arm Support  - 2 x daily - 7 x weekly - 2 sets - 10 reps - Seated  Knee Flexion Stretch  - 2 x daily - 7 x weekly - 2 sets - 10 reps - 10 hold - Heel Raises with Counter Support  - 2 x daily - 7 x weekly - 3 sets - 10 reps - Seated Long Arc Quad  - 2 x daily - 7 x weekly - 3 sets - 10 reps - Standing Knee Flexion AROM with Chair Support  - 2 x daily - 7 x weekly - 3 sets - 10 reps    ASSESSMENT:  CLINICAL IMPRESSION: Pt reports moderate stiffness in R knee today. Began with general warm up on Recumbent bike to warm RLE tissue. Followed with building/progressing HEP this date. All exercises performed in session were added to HEP. Pt tolerates all well but limited due to quad weakness and stiffness. Of note, patient was able to perform STS this session without UE support. AROM can be seen above. Patient continues to require skilled physical therapy to address her remaining impairments to return to her prior level of function.       EVAL: Patient is a 59 y.o. female who was seen today for physical therapy evaluation and treatment following a right total knee arthroplasty on 06/12/24.  She presented with moderate pain severity and irritability with right knee flexion being the most aggravating to her familiar symptoms.  She exhibited increased right knee edema.  However, she exhibited no other signs or symptoms of a postoperative complication. Her HEP was reviewed and she was encouraged to continue with these interventions. Recommend that she continue with skilled physical therapy to address her impairments to return to her prior level of function.   OBJECTIVE IMPAIRMENTS: Abnormal gait, decreased activity tolerance, decreased mobility, difficulty walking, decreased ROM, decreased strength, hypomobility, increased edema, impaired sensation, impaired tone, and pain.   ACTIVITY LIMITATIONS: lifting, standing, squatting, stairs, transfers, bathing, dressing, and locomotion level  PARTICIPATION LIMITATIONS: cleaning, laundry, driving, shopping, community activity, and  occupation  PERSONAL FACTORS: Past/current experiences, Transportation, and 3+ comorbidities: Hypertension, RA, OA, diabetes, neuropathy, and history of seizures are also affecting patient's functional outcome.   REHAB POTENTIAL: Good  CLINICAL DECISION MAKING: Evolving/moderate complexity  EVALUATION COMPLEXITY: Moderate   GOALS: Goals reviewed with patient? Yes  SHORT TERM GOALS: Target date: 07/17/24 Patient will be independent with her initial HEP.  Baseline:  Goal status: INITIAL  2.  Patient will improve her active right knee flexion to at least 90 degrees for improved right knee mobility.  Baseline:  Goal status: INITIAL  3.  Patient will be able to ambulate at least 80 feet without an assistive device for improved household mobility. Baseline:  Goal status: INITIAL  LONG TERM GOALS: Target date: 08/07/24  Patient will be independent with her advanced HEP.  Baseline:  Goal status: INITIAL  2.  Patient will improve her active right knee flexion to at least 115 degrees for improved function navigating stairs.  Baseline:  Goal status: INITIAL  3.  Patient will improve her LEFS score to at least 50/80 for improved perceived function with her daily activities. Baseline:  Goal status: INITIAL  4.  Patient will be able to ambulate at least 200 feet with no significant gait deviations. Baseline:  Goal status: INITIAL  5.  Patient will improve her 2-minute walk test distance by at least 100 feet for improved community mobility. Baseline:  Goal status: INITIAL  PLAN:  PT FREQUENCY: 2x/week  PT DURATION: 6 weeks  PLANNED INTERVENTIONS: 97164- PT Re-evaluation, 97750- Physical Performance Testing, 97110-Therapeutic exercises, 97530- Therapeutic activity, 97112- Neuromuscular re-education, 97535- Self Care, [id number]- Manual therapy, [phone]- Gait training, Patient/Family education, Balance training, Stair training, Joint mobilization, Cryotherapy, and Moist heat  PLAN FOR  NEXT SESSION: manual therapy, lower extremity strengthening, balance interventions, and gait training   3:40 PM, 07/19/24 Rosaria Settler, PT, DPT [facility] [place] "

## 2024-07-19 NOTE — Telephone Encounter (Signed)
 Received vm from patient to complete a new 7A form. IC,lmvm advised patient to provide the form and I can complete it. Stated can fax it [phone], or email to me or drop it off at [facility] and they can fax to me. Advised pt if she takes to [place], to let them know there is no charge.

## 2024-07-23 ENCOUNTER — Other Ambulatory Visit: Payer: Self-pay | Admitting: Internal Medicine

## 2024-07-23 ENCOUNTER — Encounter (HOSPITAL_COMMUNITY): Payer: Self-pay

## 2024-07-23 ENCOUNTER — Ambulatory Visit (HOSPITAL_COMMUNITY)

## 2024-07-23 DIAGNOSIS — Z96651 Presence of right artificial knee joint: Secondary | ICD-10-CM

## 2024-07-23 DIAGNOSIS — M25661 Stiffness of right knee, not elsewhere classified: Secondary | ICD-10-CM

## 2024-07-23 DIAGNOSIS — M25561 Pain in right knee: Secondary | ICD-10-CM

## 2024-07-23 NOTE — Therapy (Signed)
 " OUTPATIENT PHYSICAL THERAPY LOWER EXTREMITY TREATMENT   Patient Name: Julie Ryan MRN: [id number] DOB:[birth year], 59 y.o., female Today's Date: 07/23/2024  END OF SESSION:  PT End of Session - 07/23/24 1335     Visit Number 7    Number of Visits 12    Date for Recertification  08/31/24    Authorization Type [employer]    Authorization Time Period no authorization required    Progress Note Due on Visit 10    PT Start Time 1335    PT Stop Time 1415    PT Time Calculation (min) 40 min    Activity Tolerance Patient tolerated treatment well    Behavior During Therapy [facility] for tasks assessed/performed           Past Medical History:  Diagnosis Date   Angio-edema    Anxiety    Arthritis    Phreesia 08/19/2020   Carpal tunnel syndrome of right wrist    Chronic knee pain    Depression    Diabetes mellitus without complication (HCC)    Endometrial polyp 08/09/2017   will get appt with Dr Jayne   Fibroids 08/09/2017   Has multiple small fibroids    GERD (gastroesophageal reflux disease)    Hypertension    Neuropathy    Obesity    Prediabetes    Seizures (HCC)    1 seizure as a chold; unknown etiology and has never been on meds   Sleep apnea    Past Surgical History:  Procedure Laterality Date   APPENDECTOMY     CARPAL TUNNEL RELEASE Right 08/01/2015   Procedure: RIGHT CARPAL TUNNEL RELEASE;  Surgeon: Taft FORBES Minerva, MD;  Location: [facility];  Service: Orthopedics;  Laterality: Right;   CERVICAL POLYPECTOMY  09/14/2017   Procedure: ENDOMETRIAL POLYPECTOMY;  Surgeon: Jayne Vonn DEL, MD;  Location: [facility];  Service: Gynecology;;   COLONOSCOPY N/A 06/15/2017   Procedure: COLONOSCOPY;  Surgeon: Golda Claudis PENNER, MD;  Location: [facility];  Service: Endoscopy;  Laterality: N/A;  830   ENDOMETRIAL ABLATION N/A 09/14/2017   Procedure: ENDOMETRIAL ABLATION( Minerva);  Surgeon: Jayne Vonn DEL, MD;  Location: [facility];  Service: Gynecology;  Laterality: N/A;    ESOPHAGOGASTRODUODENOSCOPY N/A 05/25/2013   Procedure: ESOPHAGOGASTRODUODENOSCOPY (EGD);  Surgeon: Claudis PENNER Golda, MD;  Location: [facility];  Service: Endoscopy;  Laterality: N/A;  220   GASTRIC BYPASS     HYSTEROSCOPY WITH D & C N/A 09/14/2017   Procedure: DILATATION AND CURETTAGE /HYSTEROSCOPY;  Surgeon: Jayne Vonn DEL, MD;  Location: [facility];  Service: Gynecology;  Laterality: N/A;   TOTAL KNEE ARTHROPLASTY Left 05/11/2023   Procedure: TOTAL KNEE ARTHROPLASTY;  Surgeon: Minerva Taft FORBES, MD;  Location: [facility];  Service: Orthopedics;  Laterality: Left;   TOTAL KNEE ARTHROPLASTY Right 06/12/2024   Procedure: ARTHROPLASTY, KNEE, TOTAL;  Surgeon: Minerva Taft FORBES, MD;  Location: [facility];  Service: Orthopedics;  Laterality: Right;   TUBAL LIGATION     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  factor (HCC) 08/28/2020   High risk medication use 08/28/2020   Neck pain 08/19/2020   Dermatomycosis 07/01/2020   Hiatal hernia 07/17/2019   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   Endometrial polyp 08/09/2017   Fibroids 08/09/2017   Dyslipidemia 06/27/2017   Leg edema 03/08/2017   GAD (generalized anxiety disorder) 06/01/2016   Metabolic syndrome X 07/08/2013   Erosive esophagitis 06/25/2013   GERD (gastroesophageal reflux disease) 03/22/2013   Left knee pain 03/22/2013   Snoring 08/05/2011   Vitamin D  deficiency 03/21/2011   Allergic rhinitis 09/20/2010   MORBID OBESITY 02/11/2010   Essential  hypertension 02/11/2010    PCP: Antonetta Rollene BRAVO, MD   REFERRING PROVIDER: Margrette Taft BRAVO, MD   REFERRING DIAG: Primary osteoarthritis of right knee   THERAPY DIAG:  Status post right knee replacement  Acute pain of right knee  Stiffness of right knee, not elsewhere classified  Rationale for Evaluation and Treatment: Rehabilitation  ONSET DATE: 06/12/24  SUBJECTIVE:   SUBJECTIVE STATEMENT: Pt reports 7/10 stiffness in knee today. Reports some low back pain. Reports she swept and mopped floors yesterday and it went well.      EVAL: Patient reports that she is feeling pretty good today. She had a right total knee replacement on 06/12/24. She had home health PT about 4 times since surgery with their last visit being yesterday. She was given a HEP which she was able to recall and demonstrate. She goes to see her surgeon later today. She has been trying to slowly progress from her walker to the cane.   PERTINENT HISTORY: Hypertension, RA, OA, diabetes, neuropathy, and history of seizures PAIN:  Are you having pain? Yes: NPRS scale: Current: 6.5/10 Best: 5/10 Worst: 10/10 Pain location: right knee Pain description: sharp  Aggravating factors: transfers and stairs  Relieving factors: ice  PRECAUTIONS: None  RED FLAGS: None   WEIGHT BEARING RESTRICTIONS: No  FALLS:  Has patient fallen in last 6 months? No  LIVING ENVIRONMENT: Lives with: lives with their family Lives in: House/apartment Stairs: Yes: External: 2 steps; on right going up; step to pattern Has following equipment at home: Quad cane small base and Walker - 2 wheeled  OCCUPATION: not working currently; scheduled to return 09/05/24  PLOF: Independent  PATIENT GOALS: be able to walk normal   NEXT MD VISIT: 06/26/24  OBJECTIVE:  Note: Objective measures were completed at Evaluation unless otherwise noted.  DIAGNOSTIC FINDINGS: 06/12/24 right knee x-ray  IMPRESSION: Status post tricompartmental  knee replacement without evidence for immediate hardware complication. PATIENT SURVEYS:  LEFS  Extreme difficulty/unable (0), Quite a bit of difficulty (1), Moderate difficulty (2), Little difficulty (3), No difficulty (4) Survey date:  06/26/24  Any of your usual work, housework or school activities 1  2. Usual hobbies, recreational or sporting activities 4  3. Getting into/out of the bath 0  4. Walking between rooms 4  5. Putting on socks/shoes 3  6. Squatting  0  7. Lifting an object, like a bag of groceries from the floor 4  8. Performing light activities around your home 3  9. Performing heavy activities around your home 0  10. Getting into/out of a car 2  11. Walking 2 blocks 0  12. Walking 1 mile 0  13. Going up/down 10 stairs (1 flight) 0  14. Standing for 1 hour 2  15.  sitting for 1 hour 4  16. Running on even ground 0  17. Running on uneven ground 0  18. Making sharp turns while running fast 0  19. Hopping  0  20. Rolling over in bed 0  Score total:  27/80     COGNITION: Overall cognitive status: Within functional limits for tasks assessed     SENSATION: Patient reports numbness along her right lateral tibiofemoral joint line  EDEMA:  Circumferential: Right tibiofemoral joint line: 57.2 cm  Left tibiofemoral joint line: 48.2 cm   PALPATION: TTP: right hip adductors  LOWER EXTREMITY ROM:  Active ROM Right eval Left eval Left 07/16/24  Hip flexion     Hip extension     Hip abduction     Hip adduction     Hip internal rotation     Hip external rotation     Knee flexion 62 PROM: 68  107 92  Knee extension 3 0 0  Ankle dorsiflexion     Ankle plantarflexion     Ankle inversion     Ankle eversion      (Blank rows = not tested)  LOWER EXTREMITY MMT: not tested due to surgical condition  FUNCTIONAL TESTS:  2 minute walk test: 268 feet with RW   GAIT: Distance walked: 268 Assistive device utilized: Environmental Consultant - 2 wheeled Level of assistance: Modified  independence Comments: decreased gait speed and stride length                                                                                                                                TREATMENT DATE:   07/23/24:     Recumbent bike, seat 13, 5', half revolutions STM around medial and lateral joint lines of R knee AROM: 0-100 Contract/relax, pt seated EOB, PT PROM to max flexion, 3x, 10 holds Reverse lunges, towel under R foot, aiming knee to block, attempted one block, pt unable, added second block with success, sharp pain above the knee, 5x, BUE use Knee drives, 18 inch step, 10 holds, 5x   07/19/24: Recumbent bike, seat 14, 4', half revolutions Seated knee flexion, washcloth under R foot, active assist from LLE, 10 holds, 10x STS from 20 inch height, no UE use, 10x Heel raises on incline, 2x15 Standing hamstring curl, 3 lb. AW, 2x10 LAQ, 3 lb. AW, 2x10, emphasis on ecc control Hip vectors: 3 lb. AW, 10x each direction, RLE only AROM: 0-100   07/16/24: Recumbent bike, seat 15, 4', half revolutions but does one full revolution with pain  Knee Drives to max extension, 8 inch step, 10x, 10 holds Forward Step ups, 4 inch step, no UE use, 2x10 Lateral step overs, 4 inch step, 2x10 Lateral heel taps, 2 inch step, 2x10 Obstacle clearance, two 6 inch and 2 12 inch, 3x down and back, slight posterior lean to clear at times  STS, foam pad in chair, 5x, no UE use AROM measure: 0-92     PATIENT EDUCATION:  Education details: POC, healing, objective findings, anatomy, reviewed HEP, and goals for physical therapy Person  educated: Patient Education method: Explanation Education comprehension: verbalized understanding  HOME EXERCISE PROGRAM: Access Code: [email or web address] URL: [email or web address] Date: 07/19/2024 Prepared by: Rosaria Powell-Butler  Exercises - Sit to Stand Without Arm Support  - 2 x daily - 7 x weekly - 2 sets - 10 reps - Seated Knee Flexion Stretch  -  2 x daily - 7 x weekly - 2 sets - 10 reps - 10 hold - Heel Raises with Counter Support  - 2 x daily - 7 x weekly - 3 sets - 10 reps - Seated Long Arc Quad  - 2 x daily - 7 x weekly - 3 sets - 10 reps - Standing Knee Flexion AROM with Chair Support  - 2 x daily - 7 x weekly - 3 sets - 10 reps    ASSESSMENT:  CLINICAL IMPRESSION: Pt continues to have moderate stiffness in R knee. Began session with mobility on recumbent bike and followed with STM to aid in this. AROM remains 0-100. Continued manual therapy techniques, including contract/relax, to aid in flexion ROM. Remainder of session spent on activities focused on improving flexion ROM. Highly encouraged pt to push herself past her limit when performing HEP, especially for flexion ROM. Pt reports understanding. Patient continues to require skilled physical therapy to address her remaining impairments to return to her prior level of function.     EVAL: Patient is a 59 y.o. female who was seen today for physical therapy evaluation and treatment following a right total knee arthroplasty on 06/12/24.  She presented with moderate pain severity and irritability with right knee flexion being the most aggravating to her familiar symptoms.  She exhibited increased right knee edema.  However, she exhibited no other signs or symptoms of a postoperative complication. Her HEP was reviewed and she was encouraged to continue with these interventions. Recommend that she continue with skilled physical therapy to address her impairments to return to her prior level of function.   OBJECTIVE IMPAIRMENTS: Abnormal gait, decreased activity tolerance, decreased mobility, difficulty walking, decreased ROM, decreased strength, hypomobility, increased edema, impaired sensation, impaired tone, and pain.   ACTIVITY LIMITATIONS: lifting, standing, squatting, stairs, transfers, bathing, dressing, and locomotion level  PARTICIPATION LIMITATIONS: cleaning, laundry, driving,  shopping, community activity, and occupation  PERSONAL FACTORS: Past/current experiences, Transportation, and 3+ comorbidities: Hypertension, RA, OA, diabetes, neuropathy, and history of seizures are also affecting patient's functional outcome.   REHAB POTENTIAL: Good  CLINICAL DECISION MAKING: Evolving/moderate complexity  EVALUATION COMPLEXITY: Moderate   GOALS: Goals reviewed with patient? Yes  SHORT TERM GOALS: Target date: 07/17/24 Patient will be independent with her initial HEP.  Baseline:  Goal status: INITIAL  2.  Patient will improve her active right knee flexion to at least 90 degrees for improved right knee mobility.  Baseline:  Goal status: INITIAL  3.  Patient will be able to ambulate at least 80 feet without an assistive device for improved household mobility. Baseline:  Goal status: INITIAL  LONG TERM GOALS: Target date: 08/07/24  Patient will be independent with her advanced HEP.  Baseline:  Goal status: INITIAL  2.  Patient will improve her active right knee flexion to at least 115 degrees for improved function navigating stairs.  Baseline:  Goal status: INITIAL  3.  Patient will improve her LEFS score to at least 50/80 for improved perceived function with her daily activities. Baseline:  Goal status: INITIAL  4.  Patient will be able to ambulate at least 200 feet with no significant gait deviations.  Baseline:  Goal status: INITIAL  5.  Patient will improve her 2-minute walk test distance by at least 100 feet for improved community mobility. Baseline:  Goal status: INITIAL  PLAN:  PT FREQUENCY: 2x/week  PT DURATION: 6 weeks  PLANNED INTERVENTIONS: 97164- PT Re-evaluation, 97750- Physical Performance Testing, 97110-Therapeutic exercises, 97530- Therapeutic activity, [id number]- Neuromuscular re-education, 97535- Self Care, 02859- Manual therapy, [phone]- Gait training, Patient/Family education, Balance training, Stair training, Joint mobilization,  Cryotherapy, and Moist heat  PLAN FOR NEXT SESSION: manual therapy, lower extremity strengthening, balance interventions, and gait training   2:28 PM, 07/23/24 Vincenzina Jagoda Powell-Butler, PT, DPT [facility] "

## 2024-07-24 DIAGNOSIS — Z96651 Presence of right artificial knee joint: Secondary | ICD-10-CM | POA: Insufficient documentation

## 2024-07-25 ENCOUNTER — Ambulatory Visit (INDEPENDENT_AMBULATORY_CARE_PROVIDER_SITE_OTHER): Admitting: Orthopedic Surgery

## 2024-07-25 DIAGNOSIS — Z96652 Presence of left artificial knee joint: Secondary | ICD-10-CM

## 2024-07-25 DIAGNOSIS — Z96651 Presence of right artificial knee joint: Secondary | ICD-10-CM

## 2024-07-25 DIAGNOSIS — M1711 Unilateral primary osteoarthritis, right knee: Secondary | ICD-10-CM

## 2024-07-25 DIAGNOSIS — Z01818 Encounter for other preprocedural examination: Secondary | ICD-10-CM

## 2024-07-25 DIAGNOSIS — G5601 Carpal tunnel syndrome, right upper limb: Secondary | ICD-10-CM

## 2024-07-25 NOTE — Progress Notes (Unsigned)
" ° ° °  07/25/2024   Chief Complaint  Patient presents with   Routine Post Op    Encounter Diagnosis  Name Primary?   S/P total knee replacement, right 06/12/24 Yes    What pharmacy do you use ? _____[facility]______  DOI/DOS/ Date: 06/12/2024  Did you get better, worse or no change (Answer below)   Improved      "

## 2024-07-25 NOTE — Patient Instructions (Signed)
 Your surgery will be at [facility] by Dr Phyllis Breeze  plan to be in hospital overnight. The hospital will contact you with a preoperative appointment to discuss Anesthesia.  Please arrive on time or 15 minutes early for the preoperative appointment, they have a very tight schedule if you are late or do not come in your surgery will be cancelled.  The phone number for the preop area is [phone]. Please bring your medications with you for the appointment. They will tell you the arrival time for surgery and medication instructions when you have your preoperative evaluation. Do not wear nail polish the day of your surgery and if you take Phentermine you need to stop this medication ONE WEEK prior to your surgery. f you take Invokana, Farxiga, Jardiance, or Steglatro) - Hold 72 hours before the procedure.  If you take Ozempic,  Bydureo, Mounjaro, [place] or Trulicity do not take for 8 days before your surgery. If you take Victoza, Rybelsis, Saxenda or Adlyxi stop 24 hours before the procedure. Please arrive at the hospital 2 hours before procedure if scheduled at 9:30 or later in the day or at the time the nurse tells you at your preoperative visit.   If you have my chart do not use the time given in my chart use the time given to you by the nurse during your preoperative visit.   Your surgery  time may change. Please be available for phone calls the day of your surgery and the day before. The Short Stay department may need to discuss changes about your surgery time. Not reaching the you could lead to procedure delays and possible cancellation.  You must have a ride home and someone to stay with you for 24 to 48 hours. The person taking you home will receive and sign for the your discharge instructions.  Please be prepared to give your support person's name and telephone number to [facility] [employer]. Dr Phyllis Breeze will need that name and phone number post procedure.   You will also get a call from a  representative of [employer], they have a machine that you will use in the first few weeks after surgery. It is called a CPM.   You will have home physical therapy for 2 weeks after surgery, the home health agency will call you before or just following the surgery to set up visits. [facility] is the agency we normally use, unless you request another agency.   You will get a call also from outpatient therapy for therapy starting when the home therapy is done.  If you have questions or need to Reschedule the surgery, call the office ask for Mariona Scholes.

## 2024-07-25 NOTE — Progress Notes (Unsigned)
 "   POST OP VISIT   Patient: Julie Ryan           Date of Birth: 07/22/1965           MRN: [id number] Visit Date: 07/25/2024 Requested by: Antonetta Rollene BRAVO, MD [address] [place],  [place] [address] PCP: Antonetta Rollene BRAVO, MD   Encounter Diagnoses  Name Primary?   S/P total knee replacement, right 06/12/24 Yes   Primary osteoarthritis of right knee    PROCEDURE: ***  Chief Complaint  Patient presents with   Routine Post Op    Allergies[1]   Current Medications[2]  PAIN MED: *** [id number] ***  *** IMAGING: No results found.   ASSESSMENT AND PLAN:  Knee doing well no issues  Having some lower back pain handled with ibuprofen   Right carpal tunnel release in 3 weeks    I have reviewed the patient's history and given the presence of a fragility fracture, I have deemed the necessity of a osteoporosis management referral or confirmed that the patient is currently enrolled in a osteoporosis treatment program.  Per [facility] clinic policy, our goal is ensure optimal postoperative pain control with a multimodal pain management strategy. For all [facility] patients, our goal is to wean post-operative narcotic medications by 6 weeks post-operatively. If this is not possible due to utilization of pain medication prior to surgery, your [place] doctor will support your acute post-operative pain control for the first 6 weeks postoperatively, with a plan to transition you back to your primary pain team following that. [name] will work to ensure a therapist, occupational.      [1]  Allergies Allergen Reactions   Butrans [Buprenorphine] Dermatitis    Reported 05/2024   Codeine  Nausea Only and Other (See Comments)    Dizziness    Effexor  Xr [Venlafaxine  Hcl Er] Other (See Comments)    Makes patient feel on edge    Fluoxetine  Other (See Comments)    States that med caused memory problems and crying spells    Hydrocodone  Nausea Only and Other (See Comments)     Dizziness    Methotrexate  And Trimetrexate     Caused elevation in liver function tests   Latex Itching and Rash  [2]  Current Outpatient Medications:    acetaminophen  (TYLENOL ) 500 MG tablet, Take 1 tablet (500 mg total) by mouth every 6 (six) hours as needed., Disp: 100 tablet, Rfl: 2   albuterol  (VENTOLIN  HFA) 108 (90 Base) MCG/ACT inhaler, Inhale 2 puffs into the lungs every 6 (six) hours as needed for wheezing or shortness of breath., Disp: , Rfl:    amLODipine  (NORVASC ) 2.5 MG tablet, TAKE 1 TABLET(2.5 MG) BY MOUTH DAILY, Disp: 30 tablet, Rfl: 3   amLODipine  (NORVASC ) 5 MG tablet, TAKE 1 TABLET(5 MG) BY MOUTH DAILY, Disp: 90 tablet, Rfl: 1   aspirin  EC 81 MG tablet, Take 1 tablet (81 mg total) by mouth daily. Swallow whole., Disp: 90 tablet, Rfl: 2   BIOTIN  PO, Take 1 tablet by mouth daily., Disp: , Rfl:    bisacodyl  (DULCOLAX) 5 MG EC tablet, Take 1 tablet (5 mg total) by mouth daily as needed for moderate constipation., Disp: 30 tablet, Rfl: 0   budesonide-formoterol (SYMBICORT) 80-4.5 MCG/ACT inhaler, Inhale 2 puffs into the lungs 2 (two) times daily., Disp: , Rfl:    cyclobenzaprine  (FLEXERIL ) 10 MG tablet, Take 1 tablet (10 mg total) by mouth 3 (three) times daily as needed for muscle spasms., Disp: 90 tablet,  Rfl: 0   docusate sodium  (COLACE) 100 MG capsule, Take 1 capsule (100 mg total) by mouth 2 (two) times daily., Disp: 10 capsule, Rfl: 0   escitalopram  (LEXAPRO ) 10 MG tablet, Take 1 tablet (10 mg total) by mouth daily., Disp: 30 tablet, Rfl: 4   Ferrous Sulfate (IRON PO), Take 1 tablet by mouth daily., Disp: , Rfl:    gabapentin  (NEURONTIN ) 300 MG capsule, Take 1 capsule (300 mg total) by mouth 3 (three) times daily., Disp: 60 capsule, Rfl: 2   ibuprofen  (ADVIL ) 800 MG tablet, Take 1 tablet (800 mg total) by mouth every 8 (eight) hours as needed., Disp: 90 tablet, Rfl: 1   metoprolol  succinate (TOPROL -XL) 25 MG 24 hr tablet, Take 1 tablet (25 mg total) by mouth daily., Disp: 90  tablet, Rfl: 3   Oxycodone  HCl 10 MG TABS, Take 1 tablet (10 mg total) by mouth every 4 (four) hours as needed., Disp: 30 tablet, Rfl: 0   polyethylene glycol (MIRALAX  / GLYCOLAX ) 17 g packet, Take 17 g by mouth daily as needed for mild constipation., Disp: 14 each, Rfl: 0   Probiotic Product (PROBIOTIC PO), Take by mouth daily., Disp: , Rfl:    traMADol  (ULTRAM ) 50 MG tablet, Take 1 tablet (50 mg total) by mouth 4 (four) times daily., Disp: 30 tablet, Rfl: 0   Vitamin D , Ergocalciferol , (DRISDOL ) 1.25 MG (50000 UNIT) CAPS capsule, TAKE 1 CAPSULE BY MOUTH EVERY 7 DAYS, Disp: 12 capsule, Rfl: 0  "

## 2024-07-26 ENCOUNTER — Ambulatory Visit (HOSPITAL_COMMUNITY)

## 2024-07-26 ENCOUNTER — Encounter (HOSPITAL_COMMUNITY): Payer: Self-pay

## 2024-07-26 DIAGNOSIS — Z96651 Presence of right artificial knee joint: Secondary | ICD-10-CM | POA: Diagnosis not present

## 2024-07-26 DIAGNOSIS — M25561 Pain in right knee: Secondary | ICD-10-CM

## 2024-07-26 DIAGNOSIS — M25661 Stiffness of right knee, not elsewhere classified: Secondary | ICD-10-CM

## 2024-07-26 NOTE — Therapy (Signed)
 " OUTPATIENT PHYSICAL THERAPY LOWER EXTREMITY TREATMENT   Patient Name: Julie Ryan MRN: [id number] DOB:11-28-1965, 59 y.o., female Today's Date: 07/26/2024  END OF SESSION:  PT End of Session - 07/26/24 1332     Visit Number 8    Number of Visits 12    Date for Recertification  08/31/24    Authorization Type [employer]    Authorization Time Period no authorization required    Progress Note Due on Visit 10    PT Start Time 1335    PT Stop Time 1413    PT Time Calculation (min) 38 min    Activity Tolerance Patient tolerated treatment well    Behavior During Therapy [facility] for tasks assessed/performed           Past Medical History:  Diagnosis Date   Angio-edema    Anxiety    Arthritis    Phreesia 08/19/2020   Carpal tunnel syndrome of right wrist    Chronic knee pain    Depression    Diabetes mellitus without complication (HCC)    Endometrial polyp 08/09/2017   will get appt with Dr Jayne   Fibroids 08/09/2017   Has multiple small fibroids    GERD (gastroesophageal reflux disease)    Hypertension    Neuropathy    Obesity    Prediabetes    Seizures (HCC)    1 seizure as a chold; unknown etiology and has never been on meds   Sleep apnea    Past Surgical History:  Procedure Laterality Date   APPENDECTOMY     CARPAL TUNNEL RELEASE Right 08/01/2015   Procedure: RIGHT CARPAL TUNNEL RELEASE;  Surgeon: Taft FORBES Minerva, MD;  Location: [facility];  Service: Orthopedics;  Laterality: Right;   CERVICAL POLYPECTOMY  09/14/2017   Procedure: ENDOMETRIAL POLYPECTOMY;  Surgeon: Jayne Vonn DEL, MD;  Location: [facility];  Service: Gynecology;;   COLONOSCOPY N/A 06/15/2017   Procedure: COLONOSCOPY;  Surgeon: Golda Claudis PENNER, MD;  Location: [facility];  Service: Endoscopy;  Laterality: N/A;  830   ENDOMETRIAL ABLATION N/A 09/14/2017   Procedure: ENDOMETRIAL ABLATION( Minerva);  Surgeon: Jayne Vonn DEL, MD;  Location: [facility];  Service: Gynecology;  Laterality: N/A;    ESOPHAGOGASTRODUODENOSCOPY N/A 05/25/2013   Procedure: ESOPHAGOGASTRODUODENOSCOPY (EGD);  Surgeon: Claudis PENNER Golda, MD;  Location: [facility];  Service: Endoscopy;  Laterality: N/A;  220   GASTRIC BYPASS     HYSTEROSCOPY WITH D & C N/A 09/14/2017   Procedure: DILATATION AND CURETTAGE /HYSTEROSCOPY;  Surgeon: Jayne Vonn DEL, MD;  Location: [facility];  Service: Gynecology;  Laterality: N/A;   TOTAL KNEE ARTHROPLASTY Left 05/11/2023   Procedure: TOTAL KNEE ARTHROPLASTY;  Surgeon: Minerva Taft FORBES, MD;  Location: [facility];  Service: Orthopedics;  Laterality: Left;   TOTAL KNEE ARTHROPLASTY Right 06/12/2024   Procedure: ARTHROPLASTY, KNEE, TOTAL;  Surgeon: Minerva Taft FORBES, MD;  Location: [facility];  Service: Orthopedics;  Laterality: Right;   TUBAL LIGATION     Patient Active Problem List   Diagnosis Date Noted   S/P total knee replacement, right 06/12/24 07/24/2024   Need for vaccination 05/14/2024   Primary osteoarthritis of right knee 05/04/2024   LLQ pain 10/06/2023   Fatigue 10/06/2023   s/p left knee replacement 05/11/23 05/27/2023   Primary osteoarthritis of left knee 05/11/2023   Osteoarthritis of left knee 05/11/2023   COVID-19 04/04/2023   Low serum vitamin B12 12/22/2022   Bilateral primary osteoarthritis of knee 06/02/2021   Angioedema 01/01/2021  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 factor (HCC) 08/28/2020   High risk medication use 08/28/2020   Neck pain 08/19/2020   Dermatomycosis 07/01/2020   Hiatal hernia 07/17/2019   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   Endometrial polyp 08/09/2017   Fibroids 08/09/2017   Dyslipidemia 06/27/2017   Leg edema 03/08/2017   GAD (generalized anxiety disorder) 06/01/2016   Metabolic syndrome X 07/08/2013   Erosive esophagitis 06/25/2013   GERD (gastroesophageal reflux disease) 03/22/2013   Left knee pain 03/22/2013   Snoring 08/05/2011   Vitamin D  deficiency 03/21/2011   Allergic rhinitis  09/20/2010   MORBID OBESITY 02/11/2010   Essential hypertension 02/11/2010    PCP: Antonetta Rollene BRAVO, MD   REFERRING PROVIDER: Margrette Taft BRAVO, MD   REFERRING DIAG: Primary osteoarthritis of right knee   THERAPY DIAG:  Status post right knee replacement  Acute pain of right knee  Stiffness of right knee, not elsewhere classified  Rationale for Evaluation and Treatment: Rehabilitation  ONSET DATE: 06/12/24  SUBJECTIVE:   SUBJECTIVE STATEMENT: Pt reports 7/10 for pain today. Ambulates into session without SPC but uses it intermittently throughout. Reports she stopped using BSC over her toilet, difficult but able to do it.    EVAL: Patient reports that she is feeling pretty good today. She had a right total knee replacement on 06/12/24. She had home health PT about 4 times since surgery with their last visit being yesterday. She was given a HEP which she was able to recall and demonstrate. She goes to see her surgeon later today. She has been trying to slowly progress from her walker to the cane.   PERTINENT HISTORY: Hypertension, RA, OA, diabetes, neuropathy, and history of seizures PAIN:  Are you having pain? Yes: NPRS scale: Current: 6.5/10 Best: 5/10 Worst: 10/10 Pain location: right knee Pain description: sharp  Aggravating factors: transfers and stairs  Relieving factors: ice  PRECAUTIONS: None  RED FLAGS: None   WEIGHT BEARING RESTRICTIONS: No  FALLS:  Has patient fallen in last 6 months? No  LIVING ENVIRONMENT: Lives with: lives with their family Lives in: House/apartment Stairs: Yes: External: 2 steps; on right going up; step to pattern Has following equipment at home: Quad cane small base and Walker - 2 wheeled  OCCUPATION: not working currently; scheduled to return 09/05/24  PLOF: Independent  PATIENT GOALS: be able to walk normal   NEXT MD VISIT: 06/26/24  OBJECTIVE:  Note: Objective measures were completed at Evaluation unless otherwise  noted.  DIAGNOSTIC FINDINGS: 06/12/24 right knee x-ray  IMPRESSION: Status post tricompartmental knee replacement without evidence for immediate hardware complication. PATIENT SURVEYS:  LEFS  Extreme difficulty/unable (0), Quite a bit of difficulty (1), Moderate difficulty (2), Little difficulty (3), No difficulty (4) Survey date:  06/26/24  Any of your usual work, housework or school activities 1  2. Usual hobbies, recreational or sporting activities 4  3. Getting into/out of the bath 0  4. Walking between rooms 4  5. Putting on socks/shoes 3  6. Squatting  0  7. Lifting an object, like a bag of groceries from the floor 4  8. Performing light activities around your home 3  9. Performing heavy activities around your home 0  10. Getting into/out of a car 2  11. Walking 2 blocks 0  12. Walking 1 mile 0  13. Going up/down 10 stairs (1 flight) 0  14. Standing for 1 hour 2  15.  sitting for 1 hour 4  16.  Running on even ground 0  17. Running on uneven ground 0  18. Making sharp turns while running fast 0  19. Hopping  0  20. Rolling over in bed 0  Score total:  27/80     COGNITION: Overall cognitive status: Within functional limits for tasks assessed     SENSATION: Patient reports numbness along her right lateral tibiofemoral joint line  EDEMA:  Circumferential: Right tibiofemoral joint line: 57.2 cm  Left tibiofemoral joint line: 48.2 cm   PALPATION: TTP: right hip adductors  LOWER EXTREMITY ROM:  Active ROM Right eval Left eval Left 07/16/24  Hip flexion     Hip extension     Hip abduction     Hip adduction     Hip internal rotation     Hip external rotation     Knee flexion 62 PROM: 68  107 92  Knee extension 3 0 0  Ankle dorsiflexion     Ankle plantarflexion     Ankle inversion     Ankle eversion      (Blank rows = not tested)  LOWER EXTREMITY MMT: not tested due to surgical condition  FUNCTIONAL TESTS:  2 minute walk test: 268 feet with RW    GAIT: Distance walked: 268 Assistive device utilized: Environmental Consultant - 2 wheeled Level of assistance: Modified independence Comments: decreased gait speed and stride length                                                                                                                                TREATMENT DATE:   07/26/24: STS, 2x10, no UE use, equal weight bearing between each LE BOSU lunges, 10x each LE, focus on max flexion Kettle bell lift, 10 lb. Weight, 10x, from 20 inch seat height LE Press machine, plate 2 89k, plate 3 89k, plate 4 89k Knee drives to second step on 7 inch stair case, 10x AROM: 0-104    07/23/24:     Recumbent bike, seat 13, 5', half revolutions STM around medial and lateral joint lines of R knee AROM: 0-100 Contract/relax, pt seated EOB, PT PROM to max flexion, 3x, 10 holds Reverse lunges, towel under R foot, aiming knee to block, attempted one block, pt unable, added second block with success, sharp pain above the knee, 5x, BUE use Knee drives, 18 inch step, 10 holds, 5x   07/19/24: Recumbent bike, seat 14, 4', half revolutions Seated knee flexion, washcloth under R foot, active assist from LLE, 10 holds, 10x STS from 20 inch height, no UE use, 10x Heel raises on incline, 2x15 Standing hamstring curl, 3 lb. AW, 2x10 LAQ, 3 lb. AW, 2x10, emphasis on ecc control Hip vectors: 3 lb. AW, 10x each direction, RLE only AROM: 0-100    PATIENT EDUCATION:  Education details: POC, healing, objective findings, anatomy, reviewed HEP, and goals for physical therapy Person educated: Patient Education method: Explanation Education comprehension: verbalized understanding  HOME EXERCISE PROGRAM:  Access Code: [email or web address] URL: [email or web address] Date: 07/19/2024 Prepared by: Rosaria Powell-Butler  Exercises - Sit to Stand Without Arm Support  - 2 x daily - 7 x weekly - 2 sets - 10 reps - Seated Knee Flexion Stretch  - 2 x daily - 7 x weekly - 2  sets - 10 reps - 10 hold - Heel Raises with Counter Support  - 2 x daily - 7 x weekly - 3 sets - 10 reps - Seated Long Arc Quad  - 2 x daily - 7 x weekly - 3 sets - 10 reps - Standing Knee Flexion AROM with Chair Support  - 2 x daily - 7 x weekly - 3 sets - 10 reps    ASSESSMENT:  CLINICAL IMPRESSION: Began session with STS exercise. Pt demonstrates great improvement this date. No longer requiring UE support to stand from standard height chair and equal foot placement. Patient continues to make improvements with flexion ROM but continues to be limited. Educated patient on scar massage as she demonstrates some stiff spots around scar as well as continued focused on flexion HEP. Pt reports understanding. Patient continues to require skilled physical therapy to address her remaining impairments to return to her prior level of function.     EVAL: Patient is a 59 y.o. female who was seen today for physical therapy evaluation and treatment following a right total knee arthroplasty on 06/12/24.  She presented with moderate pain severity and irritability with right knee flexion being the most aggravating to her familiar symptoms.  She exhibited increased right knee edema.  However, she exhibited no other signs or symptoms of a postoperative complication. Her HEP was reviewed and she was encouraged to continue with these interventions. Recommend that she continue with skilled physical therapy to address her impairments to return to her prior level of function.   OBJECTIVE IMPAIRMENTS: Abnormal gait, decreased activity tolerance, decreased mobility, difficulty walking, decreased ROM, decreased strength, hypomobility, increased edema, impaired sensation, impaired tone, and pain.   ACTIVITY LIMITATIONS: lifting, standing, squatting, stairs, transfers, bathing, dressing, and locomotion level  PARTICIPATION LIMITATIONS: cleaning, laundry, driving, shopping, community activity, and occupation  PERSONAL  FACTORS: Past/current experiences, Transportation, and 3+ comorbidities: Hypertension, RA, OA, diabetes, neuropathy, and history of seizures are also affecting patient's functional outcome.   REHAB POTENTIAL: Good  CLINICAL DECISION MAKING: Evolving/moderate complexity  EVALUATION COMPLEXITY: Moderate   GOALS: Goals reviewed with patient? Yes  SHORT TERM GOALS: Target date: 07/17/24 Patient will be independent with her initial HEP.  Baseline:  Goal status: INITIAL  2.  Patient will improve her active right knee flexion to at least 90 degrees for improved right knee mobility.  Baseline:  Goal status: INITIAL  3.  Patient will be able to ambulate at least 80 feet without an assistive device for improved household mobility. Baseline:  Goal status: INITIAL  LONG TERM GOALS: Target date: 08/07/24  Patient will be independent with her advanced HEP.  Baseline:  Goal status: INITIAL  2.  Patient will improve her active right knee flexion to at least 115 degrees for improved function navigating stairs.  Baseline:  Goal status: INITIAL  3.  Patient will improve her LEFS score to at least 50/80 for improved perceived function with her daily activities. Baseline:  Goal status: INITIAL  4.  Patient will be able to ambulate at least 200 feet with no significant gait deviations. Baseline:  Goal status: INITIAL  5.  Patient will improve her 2-minute walk test distance by at  least 100 feet for improved community mobility. Baseline:  Goal status: INITIAL  PLAN:  PT FREQUENCY: 2x/week  PT DURATION: 6 weeks  PLANNED INTERVENTIONS: 97164- PT Re-evaluation, 97750- Physical Performance Testing, 97110-Therapeutic exercises, 97530- Therapeutic activity, [id number]- Neuromuscular re-education, 97535- Self Care, [id number]- Manual therapy, [phone]- Gait training, Patient/Family education, Balance training, Stair training, Joint mobilization, Cryotherapy, and Moist heat  PLAN FOR NEXT SESSION: manual  therapy, lower extremity strengthening, balance interventions, and gait training, try kneeling/tall kneeling next session for flexion ROM    2:16 PM, 07/26/24 Murdock Jellison Powell-Butler, PT, DPT [facility] [place] "

## 2024-07-27 ENCOUNTER — Other Ambulatory Visit: Payer: Self-pay | Admitting: Family Medicine

## 2024-07-30 ENCOUNTER — Ambulatory Visit (HOSPITAL_COMMUNITY)

## 2024-08-02 ENCOUNTER — Encounter (HOSPITAL_COMMUNITY): Payer: Self-pay

## 2024-08-02 ENCOUNTER — Ambulatory Visit (HOSPITAL_COMMUNITY)

## 2024-08-02 DIAGNOSIS — M25661 Stiffness of right knee, not elsewhere classified: Secondary | ICD-10-CM

## 2024-08-02 DIAGNOSIS — Z96651 Presence of right artificial knee joint: Secondary | ICD-10-CM | POA: Diagnosis not present

## 2024-08-02 DIAGNOSIS — M25561 Pain in right knee: Secondary | ICD-10-CM

## 2024-08-02 NOTE — Therapy (Signed)
 " OUTPATIENT PHYSICAL THERAPY LOWER EXTREMITY TREATMENT   Patient Name: Julie Ryan MRN: [id number] DOB:1965-11-06, 59 y.o., female Today's Date: 08/02/2024  END OF SESSION:  PT End of Session - 08/02/24 1335     Visit Number 9    Number of Visits 12    Date for Recertification  08/31/24    Authorization Type [employer]    Authorization Time Period no authorization required    Progress Note Due on Visit 10    PT Start Time 1335    PT Stop Time 1407    PT Time Calculation (min) 32 min    Activity Tolerance Patient tolerated treatment well    Behavior During Therapy [facility] for tasks assessed/performed           Past Medical History:  Diagnosis Date   Angio-edema    Anxiety    Arthritis    Phreesia 08/19/2020   Carpal tunnel syndrome of right wrist    Chronic knee pain    Depression    Diabetes mellitus without complication (HCC)    Endometrial polyp 08/09/2017   will get appt with Dr Jayne   Fibroids 08/09/2017   Has multiple small fibroids    GERD (gastroesophageal reflux disease)    Hypertension    Neuropathy    Obesity    Prediabetes    Seizures (HCC)    1 seizure as a chold; unknown etiology and has never been on meds   Sleep apnea    Past Surgical History:  Procedure Laterality Date   APPENDECTOMY     CARPAL TUNNEL RELEASE Right 08/01/2015   Procedure: RIGHT CARPAL TUNNEL RELEASE;  Surgeon: Taft FORBES Minerva, MD;  Location: [facility];  Service: Orthopedics;  Laterality: Right;   CERVICAL POLYPECTOMY  09/14/2017   Procedure: ENDOMETRIAL POLYPECTOMY;  Surgeon: Jayne Vonn DEL, MD;  Location: [facility];  Service: Gynecology;;   COLONOSCOPY N/A 06/15/2017   Procedure: COLONOSCOPY;  Surgeon: Golda Claudis PENNER, MD;  Location: [facility];  Service: Endoscopy;  Laterality: N/A;  830   ENDOMETRIAL ABLATION N/A 09/14/2017   Procedure: ENDOMETRIAL ABLATION( Minerva);  Surgeon: Jayne Vonn DEL, MD;  Location: [facility];  Service: Gynecology;  Laterality: N/A;    ESOPHAGOGASTRODUODENOSCOPY N/A 05/25/2013   Procedure: ESOPHAGOGASTRODUODENOSCOPY (EGD);  Surgeon: Claudis PENNER Golda, MD;  Location: [facility];  Service: Endoscopy;  Laterality: N/A;  220   GASTRIC BYPASS     HYSTEROSCOPY WITH D & C N/A 09/14/2017   Procedure: DILATATION AND CURETTAGE /HYSTEROSCOPY;  Surgeon: Jayne Vonn DEL, MD;  Location: [facility];  Service: Gynecology;  Laterality: N/A;   TOTAL KNEE ARTHROPLASTY Left 05/11/2023   Procedure: TOTAL KNEE ARTHROPLASTY;  Surgeon: Minerva Taft FORBES, MD;  Location: [facility];  Service: Orthopedics;  Laterality: Left;   TOTAL KNEE ARTHROPLASTY Right 06/12/2024   Procedure: ARTHROPLASTY, KNEE, TOTAL;  Surgeon: Minerva Taft FORBES, MD;  Location: [facility];  Service: Orthopedics;  Laterality: Right;   TUBAL LIGATION     Patient Active Problem List   Diagnosis Date Noted   S/P total knee replacement, right 06/12/24 07/24/2024   Need for vaccination 05/14/2024   Primary osteoarthritis of right knee 05/04/2024   LLQ pain 10/06/2023   Fatigue 10/06/2023   s/p left knee replacement 05/11/23 05/27/2023   Primary osteoarthritis of left knee 05/11/2023   Osteoarthritis of left knee 05/11/2023   COVID-19 04/04/2023   Low serum vitamin B12 12/22/2022   Bilateral primary osteoarthritis of knee 06/02/2021   Angioedema 01/01/2021  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 factor (HCC) 08/28/2020   High risk medication use 08/28/2020   Neck pain 08/19/2020   Dermatomycosis 07/01/2020   Hiatal hernia 07/17/2019   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   Endometrial polyp 08/09/2017   Fibroids 08/09/2017   Dyslipidemia 06/27/2017   Leg edema 03/08/2017   GAD (generalized anxiety disorder) 06/01/2016   Metabolic syndrome X 07/08/2013   Erosive esophagitis 06/25/2013   GERD (gastroesophageal reflux disease) 03/22/2013   Left knee pain 03/22/2013   Snoring 08/05/2011   Vitamin D  deficiency 03/21/2011   Allergic rhinitis  09/20/2010   MORBID OBESITY 02/11/2010   Essential hypertension 02/11/2010    PCP: Antonetta Rollene BRAVO, MD   REFERRING PROVIDER: Margrette Taft BRAVO, MD   REFERRING DIAG: Primary osteoarthritis of right knee   THERAPY DIAG:  Status post right knee replacement  Acute pain of right knee  Stiffness of right knee, not elsewhere classified  Rationale for Evaluation and Treatment: Rehabilitation  ONSET DATE: 06/12/24  SUBJECTIVE:   SUBJECTIVE STATEMENT: Pt reports 0/10 for pain today. Ambulates into session without SPC, has not used cane in 2 weeks. HEP 3x per week reported.    EVAL: Patient reports that she is feeling pretty good today. She had a right total knee replacement on 06/12/24. She had home health PT about 4 times since surgery with their last visit being yesterday. She was given a HEP which she was able to recall and demonstrate. She goes to see her surgeon later today. She has been trying to slowly progress from her walker to the cane.   PERTINENT HISTORY: Hypertension, RA, OA, diabetes, neuropathy, and history of seizures PAIN:  Are you having pain? Yes: NPRS scale: Current: 6.5/10 Best: 5/10 Worst: 10/10 Pain location: right knee Pain description: sharp  Aggravating factors: transfers and stairs  Relieving factors: ice  PRECAUTIONS: None  RED FLAGS: None   WEIGHT BEARING RESTRICTIONS: No  FALLS:  Has patient fallen in last 6 months? No  LIVING ENVIRONMENT: Lives with: lives with their family Lives in: House/apartment Stairs: Yes: External: 2 steps; on right going up; step to pattern Has following equipment at home: Quad cane small base and Walker - 2 wheeled  OCCUPATION: not working currently; scheduled to return 09/05/24  PLOF: Independent  PATIENT GOALS: be able to walk normal   NEXT MD VISIT: 06/26/24  OBJECTIVE:  Note: Objective measures were completed at Evaluation unless otherwise noted.  DIAGNOSTIC FINDINGS: 06/12/24 right knee x-ray   IMPRESSION: Status post tricompartmental knee replacement without evidence for immediate hardware complication. PATIENT SURVEYS:  LEFS  Extreme difficulty/unable (0), Quite a bit of difficulty (1), Moderate difficulty (2), Little difficulty (3), No difficulty (4) Survey date:  06/26/24  Any of your usual work, housework or school activities 1  2. Usual hobbies, recreational or sporting activities 4  3. Getting into/out of the bath 0  4. Walking between rooms 4  5. Putting on socks/shoes 3  6. Squatting  0  7. Lifting an object, like a bag of groceries from the floor 4  8. Performing light activities around your home 3  9. Performing heavy activities around your home 0  10. Getting into/out of a car 2  11. Walking 2 blocks 0  12. Walking 1 mile 0  13. Going up/down 10 stairs (1 flight) 0  14. Standing for 1 hour 2  15.  sitting for 1 hour 4  16. Running on even ground 0  17.  Running on uneven ground 0  18. Making sharp turns while running fast 0  19. Hopping  0  20. Rolling over in bed 0  Score total:  27/80     COGNITION: Overall cognitive status: Within functional limits for tasks assessed     SENSATION: Patient reports numbness along her right lateral tibiofemoral joint line  EDEMA:  Circumferential: Right tibiofemoral joint line: 57.2 cm  Left tibiofemoral joint line: 48.2 cm   PALPATION: TTP: right hip adductors  LOWER EXTREMITY ROM:  Active ROM Right eval Left eval Left 07/16/24  Hip flexion     Hip extension     Hip abduction     Hip adduction     Hip internal rotation     Hip external rotation     Knee flexion 62 PROM: 68  107 92  Knee extension 3 0 0  Ankle dorsiflexion     Ankle plantarflexion     Ankle inversion     Ankle eversion      (Blank rows = not tested)  LOWER EXTREMITY MMT: not tested due to surgical condition  FUNCTIONAL TESTS:  2 minute walk test: 268 feet with RW   GAIT: Distance walked: 268 Assistive device utilized: Environmental Consultant  - 2 wheeled Level of assistance: Modified independence Comments: decreased gait speed and stride length                                                                                                                                TREATMENT DATE:   08/02/2024  Therapeutic Exercise: -Stationary bike full, 5 minutes, seat 15>13, pt cued for 50-60 SPM -Hamstring curl machine, 2 sets of 10 reps (last set 6 rep), plate 4 and 5, pt cued for eccentric control   -Knee drive and extension stretch on 12 inch step, 1 set of 10 reps, 3 second holds each direction, pt cued for OP with UE for extension and bodyweight for flexion Neuromuscular Reeducation:  -Aeromat walks, 2 laps each variation, tandem and lateral stepping, pt cued for decreased UE support -Lunges on bosu ball, 2 sets of 8 reps bilaterally, pt cued for one UE support on second set each side   07/26/24: STS, 2x10, no UE use, equal weight bearing between each LE BOSU lunges, 10x each LE, focus on max flexion Kettle bell lift, 10 lb. Weight, 10x, from 20 inch seat height LE Press machine, plate 2 89k, plate 3 89k, plate 4 89k Knee drives to second step on 7 inch stair case, 10x AROM: 0-104    07/23/24:     Recumbent bike, seat 13, 5', half revolutions STM around medial and lateral joint lines of R knee AROM: 0-100 Contract/relax, pt seated EOB, PT PROM to max flexion, 3x, 10 holds Reverse lunges, towel under R foot, aiming knee to block, attempted one block, pt unable, added second block with success, sharp pain above the knee, 5x, BUE use Knee drives, 18  inch step, 10 holds, 5x    PATIENT EDUCATION:  Education details: POC, healing, objective findings, anatomy, reviewed HEP, and goals for physical therapy Person educated: Patient Education method: Explanation Education comprehension: verbalized understanding  HOME EXERCISE PROGRAM: Access Code: [email or web address] URL: [email or web address] Date: 07/19/2024 Prepared  by: Rosaria Powell-Butler  Exercises - Sit to Stand Without Arm Support  - 2 x daily - 7 x weekly - 2 sets - 10 reps - Seated Knee Flexion Stretch  - 2 x daily - 7 x weekly - 2 sets - 10 reps - 10 hold - Heel Raises with Counter Support  - 2 x daily - 7 x weekly - 3 sets - 10 reps - Seated Long Arc Quad  - 2 x daily - 7 x weekly - 3 sets - 10 reps - Standing Knee Flexion AROM with Chair Support  - 2 x daily - 7 x weekly - 3 sets - 10 reps    ASSESSMENT:  CLINICAL IMPRESSION: Patient continues to demonstrate decreased pain in R knee decreased RLE strength, improved gait quality and balance. Patient also demonstrates increased endurance with aerobic based exercise during today's session with ability to power up bike today. Patient able to progress dynamic balance and hamstring activation exercises today with hamstring machine and aeromat walk variations, good performance with verbal cueing. Patient educated on importance of consistent HEP compliance and icing techniques. Patient would continue to benefit from skilled physical therapy for decreased RLE pain, increased endurance with ambulation, increased LE strength/ROM, and improved balance for improved quality of life, improved independence with community ambulation and continued progress towards therapy goals.      EVAL: Patient is a 59 y.o. female who was seen today for physical therapy evaluation and treatment following a right total knee arthroplasty on 06/12/24.  She presented with moderate pain severity and irritability with right knee flexion being the most aggravating to her familiar symptoms.  She exhibited increased right knee edema.  However, she exhibited no other signs or symptoms of a postoperative complication. Her HEP was reviewed and she was encouraged to continue with these interventions. Recommend that she continue with skilled physical therapy to address her impairments to return to her prior level of function.   OBJECTIVE  IMPAIRMENTS: Abnormal gait, decreased activity tolerance, decreased mobility, difficulty walking, decreased ROM, decreased strength, hypomobility, increased edema, impaired sensation, impaired tone, and pain.   ACTIVITY LIMITATIONS: lifting, standing, squatting, stairs, transfers, bathing, dressing, and locomotion level  PARTICIPATION LIMITATIONS: cleaning, laundry, driving, shopping, community activity, and occupation  PERSONAL FACTORS: Past/current experiences, Transportation, and 3+ comorbidities: Hypertension, RA, OA, diabetes, neuropathy, and history of seizures are also affecting patient's functional outcome.   REHAB POTENTIAL: Good  CLINICAL DECISION MAKING: Evolving/moderate complexity  EVALUATION COMPLEXITY: Moderate   GOALS: Goals reviewed with patient? Yes  SHORT TERM GOALS: Target date: 07/17/24 Patient will be independent with her initial HEP.  Baseline:  Goal status: INITIAL  2.  Patient will improve her active right knee flexion to at least 90 degrees for improved right knee mobility.  Baseline:  Goal status: INITIAL  3.  Patient will be able to ambulate at least 80 feet without an assistive device for improved household mobility. Baseline:  Goal status: INITIAL  LONG TERM GOALS: Target date: 08/07/24  Patient will be independent with her advanced HEP.  Baseline:  Goal status: INITIAL  2.  Patient will improve her active right knee flexion to at least 115 degrees for improved function navigating stairs.  Baseline:  Goal status: INITIAL  3.  Patient will improve her LEFS score to at least 50/80 for improved perceived function with her daily activities. Baseline:  Goal status: INITIAL  4.  Patient will be able to ambulate at least 200 feet with no significant gait deviations. Baseline:  Goal status: INITIAL  5.  Patient will improve her 2-minute walk test distance by at least 100 feet for improved community mobility. Baseline:  Goal status:  INITIAL  PLAN:  PT FREQUENCY: 2x/week  PT DURATION: 6 weeks  PLANNED INTERVENTIONS: 02835- PT Re-evaluation, 97750- Physical Performance Testing, 97110-Therapeutic exercises, 97530- Therapeutic activity, 97112- Neuromuscular re-education, 97535- Self Care, 02859- Manual therapy, [phone]- Gait training, Patient/Family education, Balance training, Stair training, Joint mobilization, Cryotherapy, and Moist heat  PLAN FOR NEXT SESSION: manual therapy, lower extremity strengthening, balance interventions, and gait training, try kneeling/tall kneeling next session for flexion ROM    Lang Ada, PT, DPT [facility] Office: [phone] 2:11 PM, 08/02/24  "

## 2024-08-06 ENCOUNTER — Ambulatory Visit (HOSPITAL_COMMUNITY)

## 2024-08-08 NOTE — Patient Instructions (Signed)
 "       Julie Ryan  08/08/2024     @PREFPERIOPPHARMACY @   Your procedure is scheduled on 08/14/2024.   Report to [facility] at  0600 A.M.   Call this number if you have problems the morning of surgery:  [phone]  If you experience any cold or flu symptoms such as cough, fever, chills, shortness of breath, etc. between now and your scheduled surgery, please notify us  at the above number.   Remember:  Do not eat after midnight.   You may drink clear liquids until 0330 am on 08/14/2024.      Clear liquids allowed are:                    Water , Carbonated beverages (diabetics please choose diet or no sugar options), Black Coffee Only (No creamer, milk or cream, including half & half and powdered creamer), and Clear Sports drink (No red color; diabetics please choose diet or no sugar options)    Take these medicines the morning of surgery with A SIP OF WATER         flexeril (if needed), escitalopram , gabapentin ,oxycodone  or tramadol  (if needed).    Do not wear jewelry, make-up or nail polish, including gel polish,  artificial nails, or any other type of covering on natural nails (fingers and  toes).  Do not wear lotions, powders, or perfumes, or deodorant.  Do not shave 48 hours prior to surgery.  Men may shave face and neck.  Do not bring valuables to the hospital.  [facility] is not responsible for any belongings or valuables.  Contacts, dentures or bridgework may not be worn into surgery.  Leave your suitcase in the car.  After surgery it may be brought to your room.  For patients admitted to the hospital, discharge time will be determined by your treatment team.  Patients discharged the day of surgery will not be allowed to drive home and must have someone with them for 24 hours.    Special instructions:  DO NOT smoke tobacco or vape for 24 hours before your procedure.  Please read over the following fact sheets that you were given. Pain Booklet, Coughing and Deep  Breathing, Surgical Site Infection Prevention, Anesthesia Post-op Instructions, and Care and Recovery After Surgery       Open Carpal Tunnel Release: What to Know After After open carpal tunnel release surgery, it's common to have pain, swelling, bruising, and stiffness in the wrist. Follow these instructions at home: Medicines Take your medicines only as told. You may need to take steps to help treat or prevent trouble pooping (constipation), such as: Taking medicines to help you poop. Eating foods high in fiber, like beans, whole grains, and fresh fruits and vegetables. Drinking more fluids as told. If you have a splint or brace that can be taken off: Wear the splint or brace as told. Take it off only if your health care provider says you can. Check the skin around it every day. Tell your provider if you see problems. Loosen the splint or brace if your fingers tingle, are numb, or turn cold and blue. Keep the splint or brace clean. If the splint or brace isn't waterproof: Do not let it get wet. Cover it when you take a bath or shower. Use a cover that doesn't let any water  in. Bathing Do not take baths, swim, or use a hot tub until you're told it's OK. Ask if you can shower. Keep your bandage  dry until your provider says it can be removed. Cover it when you take a bath or shower. Use a cover that doesn't let any water  in. Wound care  Take care of your cut from surgery as told. Make sure you: Wash your hands with soap and water  for at least 20 seconds before and after you change your bandage. If you can't use soap and water , use hand sanitizer. Change your bandage. Leave stitches or skin glue alone. Leave tape strips alone unless you're told to take them off. You may trim the edges of the tape strips if they curl up. Check the area around your cut from surgery every day for signs of infection. Check for: Redness, swelling, or pain. Fluid or blood. Warmth. Pus or a bad  smell. Pain Management  Use ice or an ice pack as told. If you have a splint or brace that you can take off, remove it only as told. Place a towel between your skin and the ice. Leave the ice on for 20 minutes, 2-3 times a day. If your skin turns red, take off the ice right away to prevent skin damage. The risk of damage is higher if you can't feel pain, heat, or cold. Move your fingers often to avoid stiffness and swelling. Raise your wrist above the level of your heart while you're sitting or lying down. Use pillows as needed. Activity Ask when it's safe to drive if you have a splint or brace on your hand. Do not lift with your affected hand until you're told it's OK. Avoid pulling and pushing with the injured arm. Do exercises as told. Ask what things are safe for you to do at home. Ask when you can go back to work or school. General instructions Do not smoke, vape or use nicotine or tobacco. Doing this can slow down healing. Keep all follow-up visits. You'll need to have your hand checked after surgery. Your provider may give you more instructions. Make sure you know what you can and can't do. Contact a health care provider if: You have signs of infection. You have a fever. You have pain that doesn't get better with medicine. Your carpal tunnel symptoms don't go away after 2 months. Your carpal tunnel symptoms go away and then come back. You have numbness that's getting worse. Get help right away if: Your fingers or hand turn cool or pale, or they change color. They may turn blue or grey. You aren't able to move your fingers. You lose feeling in your fingers, hand, or arm. These symptoms may be an emergency. Call 911 right away. Do not wait to see if the symptoms will go away. Do not drive yourself to the hospital. This information is not intended to replace advice given to you by your health care provider. Make sure you discuss any questions you have with your health care  provider. Document Revised: 03/21/2023 Document Reviewed: 03/21/2023 Elsevier Patient Education  2024 Else[employer] Inc[employer]  Medicines to Make You Sleep for Surgery (General Anesthesia) in Adults: What to Know After The following information offers guidance on how to care for yourself after your procedure. Your health care provider may also give you more specific instructions. If you have problems or questions, contact your health care provider. What can I expect after the procedure? After the procedure, it is common for people to: Have pain or discomfort at the IV site. Have nausea or vomiting. Have a sore throat or hoarseness. Have trouble concentrating. Feel cold or  chills. Feel weak, sleepy, or tired (fatigue). Have soreness and body aches. These can affect parts of the body that were not involved in surgery. Follow these instructions at home: For the time period you were told by your health care provider:  Rest. Do not participate in activities where you could fall or become injured. Do not drive or use machinery. Do not drink alcohol. Do not take sleeping pills or medicines that cause drowsiness. Do not make important decisions or sign legal documents. Do not take care of children on your own. General instructions Drink enough fluid to keep your urine pale yellow. If you have sleep apnea, surgery and certain medicines can increase your risk for breathing problems. Follow instructions from your health care provider about wearing your sleep device: Anytime you are sleeping, including during daytime naps. While taking prescription pain medicines, sleeping medicines, or medicines that make you drowsy. Return to your normal activities as told by your health care provider. Ask your health care provider what activities are safe for you. Take over-the-counter and prescription medicines only as told by your health care provider. Do not use any products that contain nicotine or tobacco. These  products include cigarettes, chewing tobacco, and vaping devices, such as e-cigarettes. These can delay incision healing after surgery. If you need help quitting, ask your health care provider. Contact a health care provider if: You have nausea or vomiting that does not get better with medicine. You vomit every time you eat or drink. You have pain that does not get better with medicine. You cannot urinate or have bloody urine. You develop a skin rash. You have a fever. Get help right away if: You have trouble breathing. You have chest pain. You vomit blood. These symptoms may be an emergency. Get help right away. Call 911. Do not wait to see if the symptoms will go away. Do not drive yourself to the hospital. Summary After the procedure, it is common to have a sore throat, hoarseness, nausea, vomiting, or to feel weak, sleepy, or fatigue. For the time period you were told by your health care provider, do not drive or use machinery. Get help right away if you have difficulty breathing, have chest pain, or vomit blood. These symptoms may be an emergency. This information is not intended to replace advice given to you by your health care provider. Make sure you discuss any questions you have with your health care provider. Document Revised: 04/30/2024 Document Reviewed: 09/18/2021 Elsevier Patient Education  2025 Elsevier Inc.How to Use Chlorhexidine  at Home in the Shower Chlorhexidine  gluconate (CHG) is a germ-killing (antiseptic) wash that's used to clean the skin. It can get rid of the germs that normally live on the skin and can keep them away for about 24 hours. If you're having surgery, you may be told to shower with CHG at home the night before surgery. This can help lower your risk for infection. To use CHG wash in the shower, follow the steps below. Supplies needed: CHG body wash. Clean washcloth. Clean towel. How to use CHG in the shower Follow these steps unless you're told to  use CHG in a different way: Start the shower. Use your normal soap and shampoo to wash your face and hair. Turn off the shower or move out of the shower stream. Pour CHG onto a clean washcloth. Do not use any type of brush or rough sponge. Start at your neck, washing your body down to your toes. Make sure you: Wash the  part of your body where the surgery will be done for at least 1 minute. Do not scrub. Do not use CHG on your head or face unless your health care provider tells you to. If it gets into your ears or eyes, rinse them well with water . Do not wash your genitals with CHG. Wash your back and under your arms. Make sure to wash skin folds. Let the CHG sit on your skin for 1-2 minutes or as long as told. Rinse your entire body in the shower, including all body creases and folds. Turn off the shower. Dry off with a clean towel. Do not put anything on your skin afterward, such as powder, lotion, or perfume. Put on clean clothes or pajamas. If it's the night before surgery, sleep in clean sheets. General tips Use CHG only as told, and follow the instructions on the label. Use the full amount of CHG as told. This is often one bottle. Do not smoke and stay away from flames after using CHG. Your skin may feel sticky after using CHG. This is normal. The sticky feeling will go away as the CHG dries. Do not use CHG: If you have a chlorhexidine  allergy or have reacted to chlorhexidine  in the past. On open wounds or areas of skin that have broken skin, cuts, or scrapes. On babies younger than 35 months of age. Contact a health care provider if: You have questions about using CHG. Your skin gets irritated or itchy. You have a rash after using CHG. You swallow any CHG. Call your local poison control center [phone] in the [place] Your eyes itch badly, or they become very red or swollen. Your hearing changes. You have trouble seeing. If you can't reach your provider, go to an urgent care  or emergency room. Do not drive yourself. Get help right away if: You have swelling or tingling in your mouth or throat. You make high-pitched whistling sounds when you breathe, most often when you breathe out (wheeze). You have trouble breathing. These symptoms may be an emergency. Call 911 right away. Do not wait to see if the symptoms will go away. Do not drive yourself to the hospital. This information is not intended to replace advice given to you by your health care provider. Make sure you discuss any questions you have with your health care provider. Document Revised: 01/04/2023 Document Reviewed: 12/31/2021 Elsevier Patient Education  2024 Elsevier Inc.How to Use an Incentive Spirometer An incentive spirometer is a tool that measures how well you are filling your lungs with each breath. Learning to take long, deep breaths using this tool can help you keep your lungs clear and active. This may help to reverse or lessen your chance of developing breathing (pulmonary) problems, especially infection. You may be asked to use a spirometer: After a surgery. If you have a lung problem or a history of smoking. After a long period of time when you have been unable to move or be active. If the spirometer includes an indicator to show the highest number that you have reached, your health care provider or respiratory therapist will help you set a goal. Keep a log of your progress as told by your health care provider. What are the risks? Breathing too quickly may cause dizziness or cause you to pass out. Take your time so you do not get dizzy or light-headed. If you are in pain, you may need to take pain medicine before doing incentive spirometry. It is harder to take a  deep breath if you are having pain. How to use your incentive spirometer  Sit up on the edge of your bed or on a chair. Hold the incentive spirometer so that it is in an upright position. Before you use the spirometer, breathe out  normally. Place the mouthpiece in your mouth. Make sure your lips are closed tightly around it. Breathe in slowly and as deeply as you can through your mouth, causing the piston or the ball to rise toward the top of the chamber. Hold your breath for 3-5 seconds, or for as long as possible. If the spirometer includes a coach indicator, use this to guide you in breathing. Slow down your breathing if the indicator goes above the marked areas. Remove the mouthpiece from your mouth and breathe out normally. The piston or ball will return to the bottom of the chamber. Rest for a few seconds, then repeat the steps 10 or more times. Take your time and take a few normal breaths between deep breaths so that you do not get dizzy or light-headed. Do this every 1-2 hours when you are awake. If the spirometer includes a goal marker to show the highest number you have reached (best effort), use this as a goal to work toward during each repetition. After each set of 10 deep breaths, cough a few times. This will help to make sure that your lungs are clear. If you have an incision on your chest or abdomen from surgery, place a pillow or a rolled-up towel firmly against the incision when you cough. This can help to reduce pain while taking deep breaths and coughing. General tips When you are able to get out of bed: Walk around often. Continue to take deep breaths and cough in order to clear your lungs. Keep using the incentive spirometer until your health care provider says it is okay to stop using it. If you have been in the hospital, you may be told to keep using the spirometer at home. Contact a health care provider if: You are having difficulty using the spirometer. You have trouble using the spirometer as often as instructed. Your pain medicine is not giving enough relief for you to use the spirometer as told. You have a fever. Get help right away if: You develop shortness of breath. You develop a cough  with bloody mucus from the lungs. You have fluid or blood coming from an incision site after you cough. Summary An incentive spirometer is a tool that can help you learn to take long, deep breaths to keep your lungs clear and active. You may be asked to use a spirometer after a surgery, if you have a lung problem or a history of smoking, or if you have been inactive for a long period of time. Use your incentive spirometer as instructed every 1-2 hours while you are awake. If you have an incision on your chest or abdomen, place a pillow or a rolled-up towel firmly against your incision when you cough. This will help to reduce pain. Get help right away if you have shortness of breath, you cough up bloody mucus, or blood comes from your incision when you cough. This information is not intended to replace advice given to you by your health care provider. Make sure you discuss any questions you have with your health care provider. Document Revised: 04/29/2023 Document Reviewed: 04/29/2023 Elsevier Patient Education  2024 [employer]. "

## 2024-08-09 ENCOUNTER — Ambulatory Visit (HOSPITAL_COMMUNITY)

## 2024-08-09 ENCOUNTER — Other Ambulatory Visit: Payer: Self-pay

## 2024-08-09 ENCOUNTER — Other Ambulatory Visit (HOSPITAL_COMMUNITY)
Admission: RE | Admit: 2024-08-09 | Discharge: 2024-08-09 | Disposition: A | Source: Ambulatory Visit | Attending: Orthopedic Surgery | Admitting: Orthopedic Surgery

## 2024-08-09 ENCOUNTER — Encounter (HOSPITAL_COMMUNITY): Payer: Self-pay

## 2024-08-09 ENCOUNTER — Inpatient Hospital Stay (HOSPITAL_COMMUNITY): Admission: RE | Admit: 2024-08-09 | Discharge: 2024-08-09 | Attending: Orthopedic Surgery

## 2024-08-09 VITALS — BP 110/64 | HR 79 | Resp 18 | Ht 66.0 in | Wt 224.2 lb

## 2024-08-09 DIAGNOSIS — E8881 Metabolic syndrome: Secondary | ICD-10-CM

## 2024-08-09 LAB — CBC WITH DIFFERENTIAL/PLATELET
Abs Immature Granulocytes: 0.03 10*3/uL (ref 0.00–0.07)
Basophils Absolute: 0 10*3/uL (ref 0.0–0.1)
Basophils Relative: 0 %
Eosinophils Absolute: 0.2 10*3/uL (ref 0.0–0.5)
Eosinophils Relative: 2 %
HCT: 38.9 % (ref 36.0–46.0)
Hemoglobin: 12.3 g/dL (ref 12.0–15.0)
Immature Granulocytes: 0 %
Lymphocytes Relative: 26 %
Lymphs Abs: 2.4 10*3/uL (ref 0.7–4.0)
MCH: 29.4 pg (ref 26.0–34.0)
MCHC: 31.6 g/dL (ref 30.0–36.0)
MCV: 93.1 fL (ref 80.0–100.0)
Monocytes Absolute: 0.6 10*3/uL (ref 0.1–1.0)
Monocytes Relative: 6 %
Neutro Abs: 5.9 10*3/uL (ref 1.7–7.7)
Neutrophils Relative %: 66 %
Platelets: 358 10*3/uL (ref 150–400)
RBC: 4.18 MIL/uL (ref 3.87–5.11)
RDW: 13.6 % (ref 11.5–15.5)
WBC: 9.1 10*3/uL (ref 4.0–10.5)
nRBC: 0 % (ref 0.0–0.2)

## 2024-08-09 LAB — BASIC METABOLIC PANEL WITH GFR
Anion gap: 15 (ref 5–15)
BUN: 11 mg/dL (ref 6–20)
CO2: 22 mmol/L (ref 22–32)
Calcium: 9.3 mg/dL (ref 8.9–10.3)
Chloride: 100 mmol/L (ref 98–111)
Creatinine, Ser: 0.86 mg/dL (ref 0.44–1.00)
GFR, Estimated: >60 mL/min
Glucose, Bld: 82 mg/dL (ref 70–99)
Potassium: 4.2 mmol/L (ref 3.5–5.1)
Sodium: 137 mmol/L (ref 135–145)

## 2024-08-10 ENCOUNTER — Ambulatory Visit: Admitting: Family Medicine

## 2024-08-10 NOTE — H&P (Signed)
 Julie Ryan is an 59 y.o. female.   Chief Complaint: Pain and paresthesias right upper extremity HPI: 59 year old female presents for surgical treatment of right carpal tunnel syndrome Julie Ryan recently had total knee arthroplasty.  She complains of pain and paresthesias of the right upper extremity consistent with carpal tunnel syndrome with tingling and numbness involving the thumb index long and part of the ring finger  She has had some difficulties with some activities of daily living related to fine motor tasks she did not want to proceed with further nonoperative treatment but get carpal tunnel release prior to going back to work   Past Medical History:  Diagnosis Date   Angio-edema    Anxiety    Arthritis    Phreesia 08/19/2020   Carpal tunnel syndrome of right wrist    Chronic knee pain    Depression    Diabetes mellitus without complication [facility])    Endometrial polyp 08/09/2017   will get appt with Dr Jayne   Fibroids 08/09/2017   Has multiple small fibroids    GERD (gastroesophageal reflux disease)    Hypertension    Neuropathy    Obesity    Prediabetes    Seizures (HCC)    1 seizure as a chold; unknown etiology and has never been on meds   Sleep apnea     Past Surgical History:  Procedure Laterality Date   APPENDECTOMY     CARPAL TUNNEL RELEASE Right 08/01/2015   Procedure: RIGHT CARPAL TUNNEL RELEASE;  Surgeon: Julie FORBES Minerva, MD;  Location: [facility];  Service: Orthopedics;  Laterality: Right;   CERVICAL POLYPECTOMY  09/14/2017   Procedure: ENDOMETRIAL POLYPECTOMY;  Surgeon: Jayne Vonn DEL, MD;  Location: [facility];  Service: Gynecology;;   COLONOSCOPY N/A 06/15/2017   Procedure: COLONOSCOPY;  Surgeon: Golda Claudis PENNER, MD;  Location: [facility];  Service: Endoscopy;  Laterality: N/A;  830   ENDOMETRIAL ABLATION N/A 09/14/2017   Procedure: ENDOMETRIAL ABLATION( Ryan);  Surgeon: Jayne Vonn DEL, MD;  Location: [facility];  Service: Gynecology;  Laterality: N/A;    ESOPHAGOGASTRODUODENOSCOPY N/A 05/25/2013   Procedure: ESOPHAGOGASTRODUODENOSCOPY (EGD);  Surgeon: Claudis PENNER Golda, MD;  Location: [facility];  Service: Endoscopy;  Laterality: N/A;  220   GASTRIC BYPASS     HYSTEROSCOPY WITH D & C N/A 09/14/2017   Procedure: DILATATION AND CURETTAGE /HYSTEROSCOPY;  Surgeon: Jayne Vonn DEL, MD;  Location: [facility];  Service: Gynecology;  Laterality: N/A;   TOTAL KNEE ARTHROPLASTY Left 05/11/2023   Procedure: TOTAL KNEE ARTHROPLASTY;  Surgeon: Ryan Julie FORBES, MD;  Location: [facility];  Service: Orthopedics;  Laterality: Left;   TOTAL KNEE ARTHROPLASTY Right 06/12/2024   Procedure: ARTHROPLASTY, KNEE, TOTAL;  Surgeon: Ryan Julie FORBES, MD;  Location: [facility];  Service: Orthopedics;  Laterality: Right;   TUBAL LIGATION      Family History  Problem Relation Age of Onset   Hypertension Mother    Heart disease Mother    Diabetes Mother    Diabetes Sister    Multiple sclerosis Sister    Lupus Sister    Heart disease Sister    CVA Sister    Depression Sister    Diabetes Brother    Hypertension Brother    Other Son        brain tumor   Hypertension Daughter    Diabetes Sister    Social History:  reports that she quit smoking about 5 years ago. Her smoking use included cigarettes. She started smoking  about 15 years ago. She has a 5 pack-year smoking history. She has been exposed to tobacco smoke. She has never used smokeless tobacco. She reports that she does not drink alcohol and does not use drugs.  Allergies: Allergies[1]  No medications prior to admission.    Results for orders placed or performed during the hospital encounter of 08/09/24 (from the past 48 hours)  CBC with Differential/Platelet     Status: None   Collection Time: 08/09/24 12:23 PM  Result Value Ref Range   WBC 9.1 4.0 - 10.5 K/uL   RBC 4.18 3.87 - 5.11 MIL/uL   Hemoglobin 12.3 12.0 - 15.0 g/dL   HCT 61.0 63.9 - 53.9 %   MCV 93.1 80.0 - 100.0 fL   MCH 29.4 26.0 - 34.0 pg    MCHC 31.6 30.0 - 36.0 g/dL   RDW 86.3 88.4 - 84.4 %   Platelets 358 150 - 400 K/uL   nRBC 0.0 0.0 - 0.2 %   Neutrophils Relative % 66 %   Neutro Abs 5.9 1.7 - 7.7 K/uL   Lymphocytes Relative 26 %   Lymphs Abs 2.4 0.7 - 4.0 K/uL   Monocytes Relative 6 %   Monocytes Absolute 0.6 0.1 - 1.0 K/uL   Eosinophils Relative 2 %   Eosinophils Absolute 0.2 0.0 - 0.5 K/uL   Basophils Relative 0 %   Basophils Absolute 0.0 0.0 - 0.1 K/uL   Immature Granulocytes 0 %   Abs Immature Granulocytes 0.03 0.00 - 0.07 K/uL    Comment: Performed at [facility], [address], [place], [place] [address]  Basic metabolic panel     Status: None   Collection Time: 08/09/24 12:23 PM  Result Value Ref Range   Sodium 137 135 - 145 mmol/L   Potassium 4.2 3.5 - 5.1 mmol/L   Chloride 100 98 - 111 mmol/L   CO2 22 22 - 32 mmol/L   Glucose, Bld 82 70 - 99 mg/dL    Comment: Glucose reference range applies only to samples taken after fasting for at least 8 hours.   BUN 11 6 - 20 mg/dL   Creatinine, Ser 9.13 0.44 - 1.00 mg/dL   Calcium 9.3 8.9 - 89.6 mg/dL   GFR, Estimated >39 >39 mL/min    Comment: (NOTE) Calculated using the CKD-EPI Creatinine Equation (2021)    Anion gap 15 5 - 15    Comment: Performed at [facility], [address] [place], [place] [address]   No results found.  Review of Systems negative  Last menstrual period 08/31/2017. Physical Exam General Appearance is normal  Minimal swelling no varicose veins good pulses normal temperature no edema or tenderness  Lymph nodes negative in the upper extremities  Gait remarkable for alteration secondary to recent total knee arthroplasty  Inspection of the right hand and palpation reveals some tenderness over the carpal tunnel with no swelling of the hand range of motion of the hand is normal no instability strength test showed mild grip loss  2-point discrimination remains intact light touch is abnormal in the median nerve distribution  She is  alert and oriented x 3 no depression anxiety or depression  Assessment/Plan Carpal tunnel syndrome right wrist  Carpal tunnel release right wrist  Julie Minerva, MD 08/10/2024, 12:13 PM       [1]  Allergies Allergen Reactions   Butrans [Buprenorphine] Dermatitis    Reported 05/2024   Codeine  Nausea Only and Other (See Comments)    Dizziness  Effexor  Xr [Venlafaxine  Hcl Er] Other (See Comments)    Makes patient feel on edge    Fluoxetine  Other (See Comments)    States that med caused memory problems and crying spells    Hydrocodone  Nausea Only and Other (See Comments)    Dizziness    Methotrexate  And Trimetrexate     Caused elevation in liver function tests   Latex Itching and Rash

## 2024-08-13 ENCOUNTER — Ambulatory Visit (HOSPITAL_COMMUNITY)

## 2024-08-14 ENCOUNTER — Ambulatory Visit (HOSPITAL_COMMUNITY): Admission: RE | Admit: 2024-08-14 | Source: Home / Self Care | Admitting: Orthopedic Surgery

## 2024-08-14 ENCOUNTER — Encounter (HOSPITAL_COMMUNITY): Admission: RE | Payer: Self-pay | Source: Home / Self Care

## 2024-08-14 SURGERY — CARPAL TUNNEL RELEASE
Anesthesia: Choice | Laterality: Right

## 2024-08-16 ENCOUNTER — Ambulatory Visit (HOSPITAL_COMMUNITY)

## 2024-08-20 ENCOUNTER — Ambulatory Visit (HOSPITAL_COMMUNITY)

## 2024-08-23 ENCOUNTER — Ambulatory Visit (HOSPITAL_COMMUNITY)

## 2024-08-27 ENCOUNTER — Ambulatory Visit (HOSPITAL_COMMUNITY)

## 2024-08-29 ENCOUNTER — Encounter: Admitting: Orthopedic Surgery

## 2024-08-30 ENCOUNTER — Ambulatory Visit (HOSPITAL_COMMUNITY)

## 2024-09-03 ENCOUNTER — Ambulatory Visit: Admitting: Family Medicine

## 2024-09-03 ENCOUNTER — Ambulatory Visit (HOSPITAL_COMMUNITY)

## 2024-09-06 ENCOUNTER — Ambulatory Visit (HOSPITAL_COMMUNITY)
